# Patient Record
Sex: Female | Born: 1945 | Hispanic: No | Smoking: Former smoker
Health system: Southern US, Community
[De-identification: ages and names within clinical notes are randomized; demographics above are authoritative.]

## PROBLEM LIST (undated history)

## (undated) DIAGNOSIS — M359 Systemic involvement of connective tissue, unspecified: Secondary | ICD-10-CM

## (undated) DIAGNOSIS — Z972 Presence of dental prosthetic device (complete) (partial): Secondary | ICD-10-CM

## (undated) DIAGNOSIS — D649 Anemia, unspecified: Secondary | ICD-10-CM

## (undated) DIAGNOSIS — I82409 Acute embolism and thrombosis of unspecified deep veins of unspecified lower extremity: Secondary | ICD-10-CM

## (undated) DIAGNOSIS — R000 Tachycardia, unspecified: Secondary | ICD-10-CM

## (undated) DIAGNOSIS — M0520 Rheumatoid vasculitis with rheumatoid arthritis of unspecified site: Secondary | ICD-10-CM

## (undated) DIAGNOSIS — K631 Perforation of intestine (nontraumatic): Secondary | ICD-10-CM

## (undated) HISTORY — PX: FOOT SURGERY: SHX648

## (undated) HISTORY — PX: SKIN GRAFT: SHX250

## (undated) HISTORY — PX: REPLACEMENT TOTAL KNEE: SUR1224

## (undated) HISTORY — PX: ABDOMINAL HYSTERECTOMY: SHX81

---

## 2000-03-10 ENCOUNTER — Encounter: Payer: Self-pay | Admitting: Neurosurgery

## 2000-03-10 ENCOUNTER — Inpatient Hospital Stay (HOSPITAL_COMMUNITY): Admission: RE | Admit: 2000-03-10 | Discharge: 2000-03-10 | Payer: Self-pay | Admitting: Neurosurgery

## 2000-06-14 ENCOUNTER — Encounter: Payer: Self-pay | Admitting: Neurosurgery

## 2000-06-14 ENCOUNTER — Ambulatory Visit (HOSPITAL_COMMUNITY): Admission: RE | Admit: 2000-06-14 | Discharge: 2000-06-14 | Payer: Self-pay | Admitting: Neurosurgery

## 2000-11-23 ENCOUNTER — Encounter: Payer: Self-pay | Admitting: Neurosurgery

## 2000-11-23 ENCOUNTER — Ambulatory Visit (HOSPITAL_COMMUNITY): Admission: RE | Admit: 2000-11-23 | Discharge: 2000-11-23 | Payer: Self-pay | Admitting: Neurosurgery

## 2001-02-21 ENCOUNTER — Encounter: Payer: Self-pay | Admitting: Neurosurgery

## 2001-02-21 ENCOUNTER — Ambulatory Visit (HOSPITAL_COMMUNITY): Admission: RE | Admit: 2001-02-21 | Discharge: 2001-02-21 | Payer: Self-pay | Admitting: Neurosurgery

## 2003-11-23 HISTORY — PX: BACK SURGERY: SHX140

## 2005-02-26 ENCOUNTER — Ambulatory Visit: Payer: Self-pay | Admitting: Internal Medicine

## 2006-03-31 ENCOUNTER — Ambulatory Visit: Payer: Self-pay | Admitting: Internal Medicine

## 2007-02-13 ENCOUNTER — Ambulatory Visit: Payer: Self-pay | Admitting: Internal Medicine

## 2007-04-03 ENCOUNTER — Ambulatory Visit: Payer: Self-pay | Admitting: Internal Medicine

## 2008-04-04 ENCOUNTER — Ambulatory Visit: Payer: Self-pay | Admitting: Internal Medicine

## 2008-07-26 ENCOUNTER — Ambulatory Visit: Payer: Self-pay | Admitting: Unknown Physician Specialty

## 2009-04-08 ENCOUNTER — Ambulatory Visit: Payer: Self-pay | Admitting: Internal Medicine

## 2010-04-15 ENCOUNTER — Ambulatory Visit: Payer: Self-pay | Admitting: Internal Medicine

## 2010-06-01 ENCOUNTER — Ambulatory Visit: Payer: Self-pay | Admitting: Internal Medicine

## 2010-08-14 ENCOUNTER — Ambulatory Visit: Payer: Self-pay | Admitting: Internal Medicine

## 2010-10-13 ENCOUNTER — Ambulatory Visit: Payer: Self-pay | Admitting: Internal Medicine

## 2010-11-22 HISTORY — PX: JOINT REPLACEMENT: SHX530

## 2011-04-20 ENCOUNTER — Ambulatory Visit: Payer: Self-pay | Admitting: Internal Medicine

## 2011-05-10 ENCOUNTER — Ambulatory Visit: Payer: Self-pay | Admitting: Unknown Physician Specialty

## 2011-05-17 ENCOUNTER — Inpatient Hospital Stay: Payer: Self-pay | Admitting: Unknown Physician Specialty

## 2011-05-20 ENCOUNTER — Encounter: Payer: Self-pay | Admitting: Internal Medicine

## 2011-05-21 ENCOUNTER — Ambulatory Visit: Payer: Self-pay | Admitting: Internal Medicine

## 2011-05-23 ENCOUNTER — Encounter: Payer: Self-pay | Admitting: Internal Medicine

## 2011-06-23 ENCOUNTER — Encounter: Payer: Self-pay | Admitting: Internal Medicine

## 2011-09-09 ENCOUNTER — Ambulatory Visit: Payer: Self-pay | Admitting: Podiatry

## 2012-04-28 ENCOUNTER — Ambulatory Visit: Payer: Self-pay | Admitting: Internal Medicine

## 2012-05-02 ENCOUNTER — Ambulatory Visit: Payer: Self-pay | Admitting: Internal Medicine

## 2013-04-21 ENCOUNTER — Ambulatory Visit: Payer: Self-pay | Admitting: Internal Medicine

## 2013-05-08 ENCOUNTER — Ambulatory Visit: Payer: Self-pay

## 2013-06-14 ENCOUNTER — Ambulatory Visit: Payer: Self-pay | Admitting: Podiatry

## 2013-10-12 ENCOUNTER — Ambulatory Visit: Payer: Self-pay | Admitting: Gastroenterology

## 2013-10-15 LAB — PATHOLOGY REPORT

## 2014-03-07 DIAGNOSIS — M1990 Unspecified osteoarthritis, unspecified site: Secondary | ICD-10-CM | POA: Insufficient documentation

## 2014-03-14 DIAGNOSIS — Z79899 Other long term (current) drug therapy: Secondary | ICD-10-CM | POA: Insufficient documentation

## 2014-06-04 ENCOUNTER — Ambulatory Visit: Payer: Self-pay

## 2014-06-06 ENCOUNTER — Ambulatory Visit: Payer: Self-pay

## 2015-01-20 DIAGNOSIS — R000 Tachycardia, unspecified: Secondary | ICD-10-CM | POA: Insufficient documentation

## 2015-04-25 ENCOUNTER — Other Ambulatory Visit: Payer: Self-pay | Admitting: Infectious Diseases

## 2015-04-25 DIAGNOSIS — Z1231 Encounter for screening mammogram for malignant neoplasm of breast: Secondary | ICD-10-CM

## 2015-05-02 ENCOUNTER — Ambulatory Visit: Payer: Self-pay

## 2015-05-28 ENCOUNTER — Other Ambulatory Visit: Payer: Self-pay | Admitting: Family Medicine

## 2015-05-28 DIAGNOSIS — Z87891 Personal history of nicotine dependence: Secondary | ICD-10-CM

## 2015-05-29 ENCOUNTER — Ambulatory Visit
Admission: RE | Admit: 2015-05-29 | Discharge: 2015-05-29 | Disposition: A | Discharge status: Home / Self Care | Payer: Medicare Other | Source: Ambulatory Visit | Attending: Family Medicine | Admitting: Family Medicine

## 2015-05-29 ENCOUNTER — Inpatient Hospital Stay: Payer: Medicare Other | Attending: Family Medicine | Admitting: Family Medicine

## 2015-05-29 DIAGNOSIS — J439 Emphysema, unspecified: Secondary | ICD-10-CM | POA: Diagnosis not present

## 2015-05-29 DIAGNOSIS — Z122 Encounter for screening for malignant neoplasm of respiratory organs: Secondary | ICD-10-CM | POA: Diagnosis not present

## 2015-05-29 DIAGNOSIS — F17210 Nicotine dependence, cigarettes, uncomplicated: Secondary | ICD-10-CM

## 2015-05-29 DIAGNOSIS — Z87891 Personal history of nicotine dependence: Secondary | ICD-10-CM | POA: Insufficient documentation

## 2015-05-29 DIAGNOSIS — I2510 Atherosclerotic heart disease of native coronary artery without angina pectoris: Secondary | ICD-10-CM | POA: Insufficient documentation

## 2015-05-29 NOTE — Progress Notes (Signed)
In accordance with CMS guidelines, patient has meet eligibility criteria including age, absence of signs or symptoms of lung cancer, the specific calculation of cigarette smoking pack-years was 31 years and is a current smoker.   A shared decision-making session was conducted prior to the performance of CT scan. This includes one or more decision aids, includes benefits and harms of screening, follow-up diagnostic testing, over-diagnosis, false positive rate, and total radiation exposure.  Counseling on the importance of adherence to annual lung cancer LDCT screening, impact of co-morbidities, and ability or willingness to undergo diagnosis and treatment is imperative for compliance of the program.  Counseling on the importance of continued smoking cessation for former smokers; the importance of smoking cessation for current smokers and information about tobacco cessation interventions have been given to patient including the [place] [facility], [address] quit [place], as well as Cancer [facility] specific smoking cessation programs.  Written order for lung cancer screening with LDCT has been given to the patient and any and all questions have been answered to the best of my abilities.   Yearly follow up will be scheduled by Glenna Fellows, Thoracic Navigator.

## 2015-05-30 ENCOUNTER — Telehealth: Payer: Self-pay | Admitting: *Deleted

## 2015-05-30 NOTE — Telephone Encounter (Signed)
  Oncology Nurse Navigator Documentation    Navigator Encounter Type: Screening (05/30/15 1500)       Notified patient of LDCT lung cancer screening results of Lung Rads  2S finding with recommendation for 12 month follow up imaging. Also notified of incidental findings noted below.  IMPRESSION: 1. Lung-RADS Category 2S, benign appearance or behavior. Continue annual screening with low-dose chest CT without contrast in 12 months. 2. The "S" modifier above refers to potentially clinically significant non lung cancer related findings. Specifically, there is extensive atherosclerosis, including left main and 3 vessel coronary artery disease. Please note that although the presence of coronary artery calcium documents the presence of coronary artery disease, the severity of this disease and any potential stenosis cannot be assessed on this non-gated CT examination. Assessment for potential risk factor modification, dietary therapy or pharmacologic therapy may be warranted, if clinically indicated. 3. Mild diffuse bronchial wall thickening with mild centrilobular and paraseptal emphysema; imaging findings suggestive of underlying COPD.

## 2015-06-10 ENCOUNTER — Ambulatory Visit
Admission: RE | Admit: 2015-06-10 | Discharge: 2015-06-10 | Disposition: A | Discharge status: Home / Self Care | Payer: Medicare Other | Source: Ambulatory Visit | Attending: Infectious Diseases | Admitting: Infectious Diseases

## 2015-06-10 DIAGNOSIS — Z1231 Encounter for screening mammogram for malignant neoplasm of breast: Secondary | ICD-10-CM | POA: Diagnosis present

## 2016-04-19 ENCOUNTER — Ambulatory Visit
Admission: EM | Admit: 2016-04-19 | Discharge: 2016-04-19 | Disposition: A | Discharge status: Home / Self Care | Payer: Medicare Other | Attending: Family Medicine | Admitting: Family Medicine

## 2016-04-19 ENCOUNTER — Encounter: Payer: Self-pay | Admitting: Emergency Medicine

## 2016-04-19 DIAGNOSIS — J309 Allergic rhinitis, unspecified: Secondary | ICD-10-CM | POA: Diagnosis not present

## 2016-04-19 DIAGNOSIS — J0100 Acute maxillary sinusitis, unspecified: Secondary | ICD-10-CM

## 2016-04-19 HISTORY — DX: Rheumatoid vasculitis with rheumatoid arthritis of unspecified site: M05.20

## 2016-04-19 MED ORDER — AZITHROMYCIN 250 MG PO TABS
ORAL_TABLET | ORAL | Status: DC
Start: 2016-04-19 — End: 2016-09-19

## 2016-04-19 NOTE — Discharge Instructions (Signed)
Take medication as prescribed. Rest. Drink plenty of fluids.   Follow up with your primary care physician this week. Return to Urgent care for new or worsening concerns.    Allergic Rhinitis Allergic rhinitis is when the mucous membranes in the nose respond to allergens. Allergens are particles in the air that cause your body to have an allergic reaction. This causes you to release allergic antibodies. Through a chain of events, these eventually cause you to release histamine into the blood stream. Although meant to protect the body, it is this release of histamine that causes your discomfort, such as frequent sneezing, congestion, and an itchy, runny nose.  CAUSES Seasonal allergic rhinitis (hay fever) is caused by pollen allergens that may come from grasses, trees, and weeds. Year-round allergic rhinitis (perennial allergic rhinitis) is caused by allergens such as house dust mites, pet dander, and mold spores. SYMPTOMS  Nasal stuffiness (congestion).  Itchy, runny nose with sneezing and tearing of the eyes. DIAGNOSIS Your health care provider can help you determine the allergen or allergens that trigger your symptoms. If you and your health care provider are unable to determine the allergen, skin or blood testing may be used. Your health care provider will diagnose your condition after taking your health history and performing a physical exam. Your health care provider may assess you for other related conditions, such as asthma, pink eye, or an ear infection. TREATMENT Allergic rhinitis does not have a cure, but it can be controlled by:  Medicines that block allergy symptoms. These may include allergy shots, nasal sprays, and oral antihistamines.  Avoiding the allergen. Hay fever may often be treated with antihistamines in pill or nasal spray forms. Antihistamines block the effects of histamine. There are over-the-counter medicines that may help with nasal congestion and swelling around the  eyes. Check with your health care provider before taking or giving this medicine. If avoiding the allergen or the medicine prescribed do not work, there are many new medicines your health care provider can prescribe. Stronger medicine may be used if initial measures are ineffective. Desensitizing injections can be used if medicine and avoidance does not work. Desensitization is when a patient is given ongoing shots until the body becomes less sensitive to the allergen. Make sure you follow up with your health care provider if problems continue. HOME CARE INSTRUCTIONS It is not possible to completely avoid allergens, but you can reduce your symptoms by taking steps to limit your exposure to them. It helps to know exactly what you are allergic to so that you can avoid your specific triggers. SEEK MEDICAL CARE IF:  You have a fever.  You develop a cough that does not stop easily (persistent).  You have shortness of breath.  You start wheezing.  Symptoms interfere with normal daily activities.   This information is not intended to replace advice given to you by your health care provider. Make sure you discuss any questions you have with your health care provider.   Document Released: 08/03/2001 Document Revised: 11/29/2014 Document Reviewed: 07/16/2013 Elsevier Interactive Patient Education 2016 [employer]  Sinusitis, Adult Sinusitis is redness, soreness, and inflammation of the paranasal sinuses. Paranasal sinuses are air pockets within the bones of your face. They are located beneath your eyes, in the middle of your forehead, and above your eyes. In healthy paranasal sinuses, mucus is able to drain out, and air is able to circulate through them by way of your nose. However, when your paranasal sinuses are inflamed, mucus  and air can become trapped. This can allow bacteria and other germs to grow and cause infection. Sinusitis can develop quickly and last only a short time (acute) or continue  over a long period (chronic). Sinusitis that lasts for more than 12 weeks is considered chronic. CAUSES Causes of sinusitis include:  Allergies.  Structural abnormalities, such as displacement of the cartilage that separates your nostrils (deviated septum), which can decrease the air flow through your nose and sinuses and affect sinus drainage.  Functional abnormalities, such as when the small hairs (cilia) that line your sinuses and help remove mucus do not work properly or are not present. SIGNS AND SYMPTOMS Symptoms of acute and chronic sinusitis are the same. The primary symptoms are pain and pressure around the affected sinuses. Other symptoms include:  Upper toothache.  Earache.  Headache.  Bad breath.  Decreased sense of smell and taste.  A cough, which worsens when you are lying flat.  Fatigue.  Fever.  Thick drainage from your nose, which often is green and may contain pus (purulent).  Swelling and warmth over the affected sinuses. DIAGNOSIS Your health care provider will perform a physical exam. During your exam, your health care provider may perform any of the following to help determine if you have acute sinusitis or chronic sinusitis:  Look in your nose for signs of abnormal growths in your nostrils (nasal polyps).  Tap over the affected sinus to check for signs of infection.  View the inside of your sinuses using an imaging device that has a light attached (endoscope). If your health care provider suspects that you have chronic sinusitis, one or more of the following tests may be recommended:  Allergy tests.  Nasal culture. A sample of mucus is taken from your nose, sent to a lab, and screened for bacteria.  Nasal cytology. A sample of mucus is taken from your nose and examined by your health care provider to determine if your sinusitis is related to an allergy. TREATMENT Most cases of acute sinusitis are related to a viral infection and will resolve on  their own within 10 days. Sometimes, medicines are prescribed to help relieve symptoms of both acute and chronic sinusitis. These may include pain medicines, decongestants, nasal steroid sprays, or saline sprays. However, for sinusitis related to a bacterial infection, your health care provider will prescribe antibiotic medicines. These are medicines that will help kill the bacteria causing the infection. Rarely, sinusitis is caused by a fungal infection. In these cases, your health care provider will prescribe antifungal medicine. For some cases of chronic sinusitis, surgery is needed. Generally, these are cases in which sinusitis recurs more than 3 times per year, despite other treatments. HOME CARE INSTRUCTIONS  Drink plenty of water. Water helps thin the mucus so your sinuses can drain more easily.  Use a humidifier.  Inhale steam 3-4 times a day (for example, sit in the bathroom with the shower running).  Apply a warm, moist washcloth to your face 3-4 times a day, or as directed by your health care provider.  Use saline nasal sprays to help moisten and clean your sinuses.  Take medicines only as directed by your health care provider.  If you were prescribed either an antibiotic or antifungal medicine, finish it all even if you start to feel better. SEEK IMMEDIATE MEDICAL CARE IF:  You have increasing pain or severe headaches.  You have nausea, vomiting, or drowsiness.  You have swelling around your face.  You have vision problems.  You have a stiff neck.  You have difficulty breathing.   This information is not intended to replace advice given to you by your health care provider. Make sure you discuss any questions you have with your health care provider.   Document Released: 11/08/2005 Document Revised: 11/29/2014 Document Reviewed: 11/23/2011 Elsevier Interactive Patient Education [employer]

## 2016-04-19 NOTE — ED Notes (Signed)
Pt having a week of nasal congestion and runny nose, tried OTC meds but won't go away. In head and chest, and pt having a cough. Pt denies fever or chills.

## 2016-04-19 NOTE — ED Provider Notes (Signed)
[facility]  ____________________________________________  Time seen: Approximately 9:36 AM  I have reviewed the triage vital signs and the nursing notes.   HISTORY  Chief Complaint Nasal Congestion  HPI Ruth Gray is a 70 y.o. femalepresents with a complaint 7-8 days of runny nose, nasal congestion, sinus pressure, sinus drainage, and cough. Patient reports cough is worse at night with associated with postnasal drainage. Patient reports that she is frequently spitting up as well as blowing her nose and getting thick greenish mucus out. Reports sinuses feel clogged and congested. Patient reports that she does have has a history of seasonal allergies in which she feels that this is a trigger for her current symptoms. Denies fevers. Reports continues to eat and drink well. Reports has been taking over-the-counter and histamine as well as Flonase with minimal improvement but no resolution.  Denies chest pain, shortness of breath, chest pain with deep breath, wheezing, abdominal pain, neck pain, blood in sputum, back pain, dysuria, recent sickness or recent antibiotic use.  PCP: Sampson Goon   Past Medical History  Diagnosis Date  . Rheumatoid arteritis     There are no active problems to display for this patient.   Past Surgical History  Procedure Laterality Date  . Replacement total knee Left   . Foot surgery    . Abdominal hysterectomy      Current Outpatient Rx  Name  Route  Sig  Dispense  Refill  . Calcium Carb-Cholecalciferol (CALCIUM + D3 PO)   Oral   Take by mouth.         . InFLIXimab (REMICADE IV)   Intravenous   Inject into the vein once a week.         . Methotrexate, Anti-Rheumatic, (METHOTREXATE, PF, [place])   Subcutaneous   Inject into the skin.           Allergies Penicillins rash and tongue swelling Cipro: joint pain  History reviewed. No pertinent family history.  Social History Social History  Substance Use Topics  . Smoking  status: Current Every Day Smoker -- 0.50 packs/day    Types: Cigarettes  . Smokeless tobacco: None  . Alcohol Use: No    Review of Systems Constitutional: No fever/chills Eyes: No visual changes. ENT: No sore throat.as above. Cardiovascular: Denies chest pain. Respiratory: Denies shortness of breath. Gastrointestinal: No abdominal pain.  No nausea, no vomiting.  No diarrhea.  No constipation. Genitourinary: Negative for dysuria. Musculoskeletal: Negative for back pain. Skin: Negative for rash. Neurological: Negative for headaches, focal weakness or numbness.  10-point ROS otherwise negative.  ____________________________________________   PHYSICAL EXAM:  VITAL SIGNS: ED Triage Vitals  Enc Vitals Group     BP 04/19/16 0850 146/98 mmHg     Pulse Rate 04/19/16 0850 79     Resp 04/19/16 0850 16     Temp 04/19/16 0850 98.2 F (36.8 C)     Temp Source 04/19/16 0850 Oral     SpO2 04/19/16 0850 97 %     Weight 04/19/16 0850 185 lb (83.915 kg)     Height 04/19/16 0850 5\' 7"  (1.702 m)     Head Cir --      Peak Flow --      Pain Score 04/19/16 0856 1     Pain Loc --      Pain Edu? --      Excl. in GC? --    Constitutional: Alert and oriented. Well appearing and in no acute distress. Eyes: Conjunctivae are  normal. PERRL. EOMI. Head: Atraumatic.Mild to moderate tenderness to palpation bilateral frontal and maxillary sinuses. No swelling. No erythema.   Ears: no erythema, normal TMs bilaterally.   Nose: nasal congestion with bilateral nasal turbinate erythema and edema and greenish drainage.   Mouth/Throat: Mucous membranes are moist.  Oropharynx non-erythematous.No tonsillar swelling or exudate.  Neck: No stridor.  No cervical spine tenderness to palpation. Hematological/Lymphatic/Immunilogical: No cervical lymphadenopathy. Cardiovascular: Normal rate, regular rhythm. Grossly normal heart sounds.  Good peripheral circulation. Respiratory: Normal respiratory effort.  No  retractions. Lungs CTAB. No wheezes, rales or rhonchi. Good air movement.  Dry intermittent cough noted. Gastrointestinal: Soft and nontender. No distention. Normal Bowel sounds. No CVA tenderness. Musculoskeletal: No lower or upper extremity tenderness nor edema.  No calf tenderness bilaterally. No cervical, thoracic or lumbar tenderness to palpation.  Neurologic:  Normal speech and language. No gross focal neurologic deficits are appreciated. No gait instability. Skin:  Skin is warm, dry and intact. No rash noted. Psychiatric: Mood and affect are normal. Speech and behavior are normal.  ____________________________________________   LABS (all labs ordered are listed, but only abnormal results are displayed)  Labs Reviewed - No data to display  RADIOLOGY  No results found.   INITIAL IMPRESSION / ASSESSMENT AND PLAN / ED COURSE  Pertinent labs & imaging results that were available during my care of the patient were reviewed by me and considered in my medical decision making (see chart for details).  Very well-appearing patient. No acute distress. Presents for the complaints of runny nose, nasal congestion, sinus pressure and sinus drainage, cough and postnasal drainage 7-8 days. Unrelieved by over-the-counter medications. Suspect sinusitis and bronchitis. Patient reports penicillin allergic with rash and tongue swelling, on methotrexate, and allergic to Cipro will treat patient with oral azithromycin. Patient reports has taken azithromycin in the past and tolerated well. Patient denies need for cough medication. Continue home over-the-counter Claritin and Flonase. Encouraged rest, fluids and PCP follow-up. Discussed indication, risks and benefits of medications with patient.  Discussed follow up with Primary care physician this week. Discussed follow up and return parameters including no resolution or any worsening concerns. Patient verbalized understanding and agreed to plan.    ____________________________________________   FINAL CLINICAL IMPRESSION(S) / ED DIAGNOSES  Final diagnoses:  Acute maxillary sinusitis, recurrence not specified  Allergic rhinitis, unspecified allergic rhinitis type     Discharge Medication List as of 04/19/2016  9:50 AM      Note: This dictation was prepared with Dragon dictation along with smaller phrase technology. Any transcriptional errors that result from this process are unintentional.       Renford Dills, NP 04/19/16 1030

## 2016-04-30 ENCOUNTER — Other Ambulatory Visit: Payer: Self-pay | Admitting: Infectious Diseases

## 2016-04-30 DIAGNOSIS — I2510 Atherosclerotic heart disease of native coronary artery without angina pectoris: Secondary | ICD-10-CM | POA: Insufficient documentation

## 2016-04-30 DIAGNOSIS — Z1231 Encounter for screening mammogram for malignant neoplasm of breast: Secondary | ICD-10-CM

## 2016-05-23 ENCOUNTER — Ambulatory Visit (INDEPENDENT_AMBULATORY_CARE_PROVIDER_SITE_OTHER): Payer: Medicare Other

## 2016-05-23 ENCOUNTER — Ambulatory Visit
Admission: EM | Admit: 2016-05-23 | Discharge: 2016-05-23 | Disposition: A | Discharge status: Home / Self Care | Payer: Medicare Other | Attending: Family Medicine | Admitting: Family Medicine

## 2016-05-23 ENCOUNTER — Encounter: Payer: Self-pay | Admitting: *Deleted

## 2016-05-23 DIAGNOSIS — M25561 Pain in right knee: Secondary | ICD-10-CM

## 2016-05-23 MED ORDER — OXYCODONE-ACETAMINOPHEN 5-325 MG PO TABS: 0.5000 | ORAL_TABLET | Freq: Three times a day (TID) | ORAL | Status: DC | PRN

## 2016-05-23 NOTE — Discharge Instructions (Signed)
Take medication as prescribed. Rest. Apply ice and elevate. Use walker at home as needed.  Follow up with orthopedic this week as discussed.   Follow up with your primary care physician this week as needed. Return to Urgent care for new or worsening concerns.     Knee Pain Knee pain is a very common symptom and can have many causes. Knee pain often goes away when you follow your health care provider's instructions for relieving pain and discomfort at home. However, knee pain can develop into a condition that needs treatment. Some conditions may include:  Arthritis caused by wear and tear (osteoarthritis).  Arthritis caused by swelling and irritation (rheumatoid arthritis or gout).  A cyst or growth in your knee.  An infection in your knee joint.  An injury that will not heal.  Damage, swelling, or irritation of the tissues that support your knee (torn ligaments or tendinitis). If your knee pain continues, additional tests may be ordered to diagnose your condition. Tests may include X-rays or other imaging studies of your knee. You may also need to have fluid removed from your knee. Treatment for ongoing knee pain depends on the cause, but treatment may include:  Medicines to relieve pain or swelling.  Steroid injections in your knee.  Physical therapy.  Surgery. HOME CARE INSTRUCTIONS  Take medicines only as directed by your health care provider.  Rest your knee and keep it raised (elevated) while you are resting.  Do not do things that cause or worsen pain.  Avoid high-impact activities or exercises, such as running, jumping rope, or doing jumping jacks.  Apply ice to the knee area:  Put ice in a plastic bag.  Place a towel between your skin and the bag.  Leave the ice on for 20 minutes, 2-3 times a day.  Ask your health care provider if you should wear an elastic knee support.  Keep a pillow under your knee when you sleep.  Lose weight if you are overweight.  Extra weight can put pressure on your knee.  Do not use any tobacco products, including cigarettes, chewing tobacco, or electronic cigarettes. If you need help quitting, ask your health care provider. Smoking may slow the healing of any bone and joint problems that you may have. SEEK MEDICAL CARE IF:  Your knee pain continues, changes, or gets worse.  You have a fever along with knee pain.  Your knee buckles or locks up.  Your knee becomes more swollen. SEEK IMMEDIATE MEDICAL CARE IF:   Your knee joint feels hot to the touch.  You have chest pain or trouble breathing.   This information is not intended to replace advice given to you by your health care provider. Make sure you discuss any questions you have with your health care provider.   Document Released: 09/05/2007 Document Revised: 11/29/2014 Document Reviewed: 06/24/2014 Elsevier Interactive Patient Education [employer]

## 2016-05-23 NOTE — ED Provider Notes (Signed)
[facility]  ____________________________________________  Time seen: Approximately 8:28 AM  I have reviewed the triage vital signs and the nursing notes.   HISTORY  Chief Complaint Knee Pain  HPI Ruth Gray is a 70 y.o. female presents with a complaint of right knee pain. Patient reports right knee pain onset was yesterday when she stepped out of the shower. Patient states that she stepped out of the shower onto the floor which is about a 3 inch height and when she planted her right foot she had pain and a tearing sound to her right knee. Denies any slip or sliding. Denies fall. Denies head injury or loss consciousness or other injury.  Patient reports she's had continued right knee pain since. Patient states pain is mostly with walking and weightbearing. Denies feeling of instability or that the knee is going to give out. Patient reports that she has had history of occasional right knee pain with arthritis. 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 Denies any pain radiation. Denies any pain in the back of the knee. Patient states most of the pain is to the front on the top of her right knee. Denies any swelling, redness, trauma or direct injury. Reports feeling well otherwise. Reports ice and resting the knee did help.    PCP: Sampson Goon   Past Medical History  Diagnosis Date  . Rheumatoid arteritis     There are no active problems to display for this patient.   Past Surgical History  Procedure Laterality Date  . Replacement total knee Left   . Foot surgery    . Abdominal hysterectomy      Current Outpatient Rx  Name  Route  Sig  Dispense  Refill  . Calcium Carb-Cholecalciferol (CALCIUM + D3 PO)   Oral   Take by mouth.         . InFLIXimab (REMICADE IV)   Intravenous   Inject into the vein once a week.         . Methotrexate, Anti-Rheumatic, (METHOTREXATE, PF, [place])   Subcutaneous   Inject into the skin.         .              Allergies Penicillins  History reviewed. No pertinent family history.  Social History Social History  Substance Use Topics  . Smoking status: Current Every Day Smoker -- 0.50 packs/day    Types: Cigarettes  . Smokeless tobacco: None  . Alcohol Use: No    Review of Systems Constitutional: No fever/chills Eyes: No visual changes. ENT: No sore throat. Cardiovascular: Denies chest pain. Respiratory: Denies shortness of breath. Gastrointestinal: No abdominal pain.  No nausea, no vomiting.  No diarrhea.  No constipation. Genitourinary: Negative for dysuria. Musculoskeletal: Negative for back pain. Positive knee pain.  Skin: Negative for rash. Neurological: Negative for headaches, focal weakness or numbness.  10-point ROS otherwise negative.  ____________________________________________   PHYSICAL EXAM:  VITAL SIGNS: ED Triage Vitals  Enc Vitals Group     BP 05/23/16 0811 155/96 mmHg     Pulse Rate 05/23/16 0811 83     Resp 05/23/16 0811 18     Temp 05/23/16 0811 97.9 F (36.6 C)     Temp Source 05/23/16 0811 Oral     SpO2 05/23/16 0811 99 %     Weight 05/23/16 0811 186 lb (84.369 kg)     Height 05/23/16 0811 5\' 7"  (1.702 m)     Head Cir --  Peak Flow --      Pain Score 05/23/16 0813 9     Pain Loc --      Pain Edu? --      Excl. in GC? --     Constitutional: Alert and oriented. Well appearing and in no acute distress. Eyes: Conjunctivae are normal. PERRL. EOMI. Head: Atraumatic.  Mouth/Throat: Mucous membranes are moist.  Neck: No stridor.  No cervical spine tenderness to palpation. Hematological/Lymphatic/Immunilogical: No cervical lymphadenopathy. Cardiovascular: Normal rate, regular rhythm. Grossly normal heart sounds.  Good peripheral circulation. Respiratory: Normal respiratory effort.  No retractions. Lungs CTAB. No wheezes, rales or rhonchi.  Gastrointestinal: Soft and nontender. No distention.  Musculoskeletal: No lower or upper extremity  tenderness nor edema.  No cervical, thoracic, or lumbar tenderness to palpation.  Except: right medial and lateral knee mild to moderate tenderness to direct palpation, pain with anterior drawer test and medial stress, full range of motion, no erythema, no ecchymosis, skin intact, no noted swelling, mild pain with resisted knee extension and flexion, no posterior knee tenderness. No calf tenderness bilaterally. Right lower extremity otherwise nontender. Bilateral pedal pulses equal and easily palpated.   Neurologic:  Normal speech and language. No gross focal neurologic deficits are appreciated. No gait instability. Skin:  Skin is warm, dry and intact. No rash noted. Psychiatric: Mood and affect are normal. Speech and behavior are normal.  ____________________________________________   LABS (all labs ordered are listed, but only abnormal results are displayed)  Labs Reviewed - No data to display  RADIOLOGY  Dg Knee Complete 4 Views Right  05/23/2016  CLINICAL DATA:  Twisted knee.  Medial and lateral knee pain EXAM: RIGHT KNEE - COMPLETE 4+ VIEW COMPARISON:  None. FINDINGS: Mild joint space narrowing medially with moderate spurring in the medial joint compartment. Widening of the lateral joint compartment with extensive spurring. Extensive joint space narrowing and spurring in the patellofemoral joint. Joint effusion. Calcification posterior to the joint compatible with loose body. Negative for fracture IMPRESSION: Advanced degenerative change. Calcified loose body in the joint. No fracture. Electronically Signed   By: Marlan Palau M.D.   On: 05/23/2016 09:11   ____________________________________________   PROCEDURES  Procedure(s) performed:  Denies need for assistive devices.  ____________________   INITIAL IMPRESSION / ASSESSMENT AND PLAN / ED COURSE  Pertinent labs & imaging results that were available during my care of the patient were reviewed by me and considered in my medical  decision making (see chart for details).  Well-appearing patient. No acute distress. Presents for the complaints of right knee pain after stepping down her repeat technique a shower yesterday. Denies fall. Denies direct trauma. Denies head injury or loss consciousness. Denies other pain. Pain is fully reproducible by palpation and movement. Will evaluate x-ray.  Per radiologist x-ray advanced degenerative change, calcified loose body in the joint, no fracture, joint effusion. Patient with arthritic knee and concern for ligamentous or meniscal injury in addition. Discussed with patient rest, ice, elevation conservative management. Patient reports she has a walker at home if needed. Denies need for walker, assistive devices or splinting. Will prescribe Percocet as needed for breakthrough pain. Patient currently taking methotrexate. Encouraged patient to take half tablet only at a time as needed for Percocet patient agreed to this. Follow-up with orthopedics week. Patient states she follows with an orthopedic in the past.Discussed indication, risks and benefits of medications with patient.  Discussed follow up with Primary care physician this week. Discussed follow up and return parameters including  no resolution or any worsening concerns. Patient verbalized understanding and agreed to plan.   ____________________________________________   FINAL CLINICAL IMPRESSION(S) / ED DIAGNOSES  Final diagnoses:  Right knee pain     Discharge Medication List as of 05/23/2016  9:22 AM    START taking these medications   Details  oxyCODONE-acetaminophen (ROXICET) 5-325 MG tablet Take 0.5 tablets by mouth every 8 (eight) hours as needed for moderate pain or severe pain (take 0.5-1 tablet as needed; Do not drive or operate heavy machinery while taking as can cause drowsiness.)., Starting 05/23/2016, Until Discontinued, Print        Note: This dictation was prepared with Dragon dictation along with smaller  phrase technology. Any transcriptional errors that result from this process are unintentional.       Renford Dills, NP 05/23/16 1152

## 2016-05-23 NOTE — ED Notes (Signed)
Patient complains of right knee pain. She says she stepped out of the shower yesterday and heard a crunch/tear sound.

## 2016-06-08 DIAGNOSIS — M1711 Unilateral primary osteoarthritis, right knee: Secondary | ICD-10-CM | POA: Insufficient documentation

## 2016-06-08 DIAGNOSIS — M25561 Pain in right knee: Secondary | ICD-10-CM | POA: Insufficient documentation

## 2016-06-14 ENCOUNTER — Ambulatory Visit
Admission: RE | Admit: 2016-06-14 | Discharge: 2016-06-14 | Disposition: A | Discharge status: Home / Self Care | Payer: Medicare Other | Source: Ambulatory Visit | Attending: Infectious Diseases | Admitting: Infectious Diseases

## 2016-06-14 ENCOUNTER — Other Ambulatory Visit: Payer: Self-pay | Admitting: Infectious Diseases

## 2016-06-14 DIAGNOSIS — Z1231 Encounter for screening mammogram for malignant neoplasm of breast: Secondary | ICD-10-CM | POA: Diagnosis present

## 2016-06-16 ENCOUNTER — Other Ambulatory Visit: Payer: Self-pay | Admitting: Unknown Physician Specialty

## 2016-06-16 DIAGNOSIS — M25561 Pain in right knee: Secondary | ICD-10-CM

## 2016-06-24 ENCOUNTER — Telehealth: Payer: Self-pay | Admitting: *Deleted

## 2016-06-24 NOTE — Telephone Encounter (Signed)
Left voicemail for patient notifyng them that it is time to schedule annual low dose lung cancer screening CT scan. Instructed patient to call back to verify information prior to the scan being scheduled.  

## 2016-06-26 ENCOUNTER — Ambulatory Visit
Admission: RE | Admit: 2016-06-26 | Discharge: 2016-06-26 | Disposition: A | Discharge status: Home / Self Care | Payer: Medicare Other | Source: Ambulatory Visit | Attending: Unknown Physician Specialty | Admitting: Unknown Physician Specialty

## 2016-06-26 DIAGNOSIS — M25561 Pain in right knee: Secondary | ICD-10-CM | POA: Diagnosis not present

## 2016-06-26 DIAGNOSIS — M1711 Unilateral primary osteoarthritis, right knee: Secondary | ICD-10-CM | POA: Diagnosis not present

## 2016-06-26 DIAGNOSIS — M23311 Other meniscus derangements, anterior horn of medial meniscus, right knee: Secondary | ICD-10-CM | POA: Insufficient documentation

## 2016-06-26 DIAGNOSIS — M23341 Other meniscus derangements, anterior horn of lateral meniscus, right knee: Secondary | ICD-10-CM | POA: Insufficient documentation

## 2016-06-28 ENCOUNTER — Telehealth: Payer: Self-pay | Admitting: *Deleted

## 2016-06-28 NOTE — Telephone Encounter (Signed)
Notified patient that annual lung cancer screening low dose CT scan is due. Confirmed that patient is within the age range of 55-77, and asymptomatic, (no signs or symptoms of lung cancer). Patient denies illness that would prevent curative treatment for lung cancer if found. The patient is a current smoker, with a 31.25 pack year history. The shared decision making visit was done 05/29/15. Patient is agreeable for CT scan being scheduled.

## 2016-06-29 DIAGNOSIS — Z96652 Presence of left artificial knee joint: Secondary | ICD-10-CM | POA: Insufficient documentation

## 2016-07-09 ENCOUNTER — Other Ambulatory Visit: Payer: Self-pay | Admitting: *Deleted

## 2016-07-09 DIAGNOSIS — Z87891 Personal history of nicotine dependence: Secondary | ICD-10-CM

## 2016-07-22 ENCOUNTER — Ambulatory Visit
Admission: RE | Admit: 2016-07-22 | Discharge: 2016-07-22 | Disposition: A | Discharge status: Home / Self Care | Payer: Medicare Other | Source: Ambulatory Visit | Attending: Oncology | Admitting: Oncology

## 2016-08-02 ENCOUNTER — Telehealth: Payer: Self-pay | Admitting: *Deleted

## 2016-08-02 NOTE — Telephone Encounter (Signed)
Patient reports that she was not notified of CT scan being scheduled. Will have rescheduled.

## 2016-08-09 ENCOUNTER — Ambulatory Visit
Admission: RE | Admit: 2016-08-09 | Discharge: 2016-08-09 | Disposition: A | Discharge status: Home / Self Care | Payer: Medicare Other | Source: Ambulatory Visit | Attending: Oncology | Admitting: Oncology

## 2016-08-09 DIAGNOSIS — J439 Emphysema, unspecified: Secondary | ICD-10-CM | POA: Diagnosis not present

## 2016-08-09 DIAGNOSIS — I2510 Atherosclerotic heart disease of native coronary artery without angina pectoris: Secondary | ICD-10-CM | POA: Diagnosis not present

## 2016-08-09 DIAGNOSIS — Z87891 Personal history of nicotine dependence: Secondary | ICD-10-CM | POA: Diagnosis present

## 2016-08-13 ENCOUNTER — Telehealth: Payer: Self-pay | Admitting: *Deleted

## 2016-08-13 NOTE — Telephone Encounter (Signed)
Voicemail left for patient to call back to be notified of LDCT lung cancer screening results with recommendation for 12 month follow up imaging. Also will be notified of incidental finding noted below. This note will be sent to PCP via Epic.  IMPRESSION: 1. Lung-RADS Category 2, benign appearance or behavior. Continue annual screening with low-dose chest CT without contrast in 12 months. 2. Coronary artery atherosclerosis. 3. Emphysema.

## 2016-08-22 DIAGNOSIS — K631 Perforation of intestine (nontraumatic): Secondary | ICD-10-CM

## 2016-08-22 HISTORY — DX: Perforation of intestine (nontraumatic): K63.1

## 2016-09-19 ENCOUNTER — Encounter: Payer: Self-pay | Admitting: Radiology

## 2016-09-19 ENCOUNTER — Emergency Department: Payer: Medicare Other

## 2016-09-19 ENCOUNTER — Inpatient Hospital Stay
Admission: EM | Admit: 2016-09-19 | Discharge: 2016-10-13 | DRG: 329 | Disposition: A | Discharge status: Skilled Nursing Facility | Payer: Medicare Other | Attending: Surgery | Admitting: Surgery

## 2016-09-19 DIAGNOSIS — Z79899 Other long term (current) drug therapy: Secondary | ICD-10-CM | POA: Diagnosis not present

## 2016-09-19 DIAGNOSIS — E43 Unspecified severe protein-calorie malnutrition: Secondary | ICD-10-CM | POA: Insufficient documentation

## 2016-09-19 DIAGNOSIS — Z90710 Acquired absence of both cervix and uterus: Secondary | ICD-10-CM

## 2016-09-19 DIAGNOSIS — G9341 Metabolic encephalopathy: Secondary | ICD-10-CM | POA: Diagnosis not present

## 2016-09-19 DIAGNOSIS — M359 Systemic involvement of connective tissue, unspecified: Secondary | ICD-10-CM | POA: Diagnosis present

## 2016-09-19 DIAGNOSIS — Z96652 Presence of left artificial knee joint: Secondary | ICD-10-CM | POA: Diagnosis present

## 2016-09-19 DIAGNOSIS — K5720 Diverticulitis of large intestine with perforation and abscess without bleeding: Principal | ICD-10-CM

## 2016-09-19 DIAGNOSIS — E669 Obesity, unspecified: Secondary | ICD-10-CM | POA: Diagnosis present

## 2016-09-19 DIAGNOSIS — R2681 Unsteadiness on feet: Secondary | ICD-10-CM

## 2016-09-19 DIAGNOSIS — Y839 Surgical procedure, unspecified as the cause of abnormal reaction of the patient, or of later complication, without mention of misadventure at the time of the procedure: Secondary | ICD-10-CM | POA: Diagnosis not present

## 2016-09-19 DIAGNOSIS — Z9049 Acquired absence of other specified parts of digestive tract: Secondary | ICD-10-CM

## 2016-09-19 DIAGNOSIS — K9429 Other complications of gastrostomy: Secondary | ICD-10-CM | POA: Diagnosis not present

## 2016-09-19 DIAGNOSIS — T814XXA Infection following a procedure, initial encounter: Secondary | ICD-10-CM | POA: Diagnosis not present

## 2016-09-19 DIAGNOSIS — Y92230 Patient room in hospital as the place of occurrence of the external cause: Secondary | ICD-10-CM | POA: Diagnosis not present

## 2016-09-19 DIAGNOSIS — A419 Sepsis, unspecified organism: Secondary | ICD-10-CM | POA: Diagnosis not present

## 2016-09-19 DIAGNOSIS — L0291 Cutaneous abscess, unspecified: Secondary | ICD-10-CM

## 2016-09-19 DIAGNOSIS — Z885 Allergy status to narcotic agent status: Secondary | ICD-10-CM

## 2016-09-19 DIAGNOSIS — E872 Acidosis: Secondary | ICD-10-CM | POA: Diagnosis not present

## 2016-09-19 DIAGNOSIS — J9600 Acute respiratory failure, unspecified whether with hypoxia or hypercapnia: Secondary | ICD-10-CM

## 2016-09-19 DIAGNOSIS — Z6830 Body mass index (BMI) 30.0-30.9, adult: Secondary | ICD-10-CM

## 2016-09-19 DIAGNOSIS — K5792 Diverticulitis of intestine, part unspecified, without perforation or abscess without bleeding: Secondary | ICD-10-CM

## 2016-09-19 DIAGNOSIS — Z880 Allergy status to penicillin: Secondary | ICD-10-CM

## 2016-09-19 DIAGNOSIS — F17210 Nicotine dependence, cigarettes, uncomplicated: Secondary | ICD-10-CM | POA: Diagnosis present

## 2016-09-19 DIAGNOSIS — K56609 Unspecified intestinal obstruction, unspecified as to partial versus complete obstruction: Secondary | ICD-10-CM

## 2016-09-19 DIAGNOSIS — R112 Nausea with vomiting, unspecified: Secondary | ICD-10-CM

## 2016-09-19 DIAGNOSIS — E876 Hypokalemia: Secondary | ICD-10-CM | POA: Diagnosis present

## 2016-09-19 DIAGNOSIS — K651 Peritoneal abscess: Secondary | ICD-10-CM | POA: Diagnosis not present

## 2016-09-19 DIAGNOSIS — R109 Unspecified abdominal pain: Secondary | ICD-10-CM

## 2016-09-19 DIAGNOSIS — D649 Anemia, unspecified: Secondary | ICD-10-CM | POA: Diagnosis present

## 2016-09-19 DIAGNOSIS — R6520 Severe sepsis without septic shock: Secondary | ICD-10-CM | POA: Diagnosis not present

## 2016-09-19 DIAGNOSIS — E162 Hypoglycemia, unspecified: Secondary | ICD-10-CM | POA: Diagnosis present

## 2016-09-19 DIAGNOSIS — I959 Hypotension, unspecified: Secondary | ICD-10-CM | POA: Diagnosis present

## 2016-09-19 DIAGNOSIS — Z452 Encounter for adjustment and management of vascular access device: Secondary | ICD-10-CM

## 2016-09-19 DIAGNOSIS — R1030 Lower abdominal pain, unspecified: Secondary | ICD-10-CM

## 2016-09-19 DIAGNOSIS — N170 Acute kidney failure with tubular necrosis: Secondary | ICD-10-CM | POA: Diagnosis present

## 2016-09-19 DIAGNOSIS — R000 Tachycardia, unspecified: Secondary | ICD-10-CM | POA: Diagnosis present

## 2016-09-19 DIAGNOSIS — Z4659 Encounter for fitting and adjustment of other gastrointestinal appliance and device: Secondary | ICD-10-CM

## 2016-09-19 DIAGNOSIS — K567 Ileus, unspecified: Secondary | ICD-10-CM | POA: Diagnosis present

## 2016-09-19 DIAGNOSIS — K910 Vomiting following gastrointestinal surgery: Secondary | ICD-10-CM | POA: Diagnosis not present

## 2016-09-19 DIAGNOSIS — IMO0002 Reserved for concepts with insufficient information to code with codable children: Secondary | ICD-10-CM

## 2016-09-19 DIAGNOSIS — M069 Rheumatoid arthritis, unspecified: Secondary | ICD-10-CM | POA: Diagnosis present

## 2016-09-19 DIAGNOSIS — M6281 Muscle weakness (generalized): Secondary | ICD-10-CM

## 2016-09-19 DIAGNOSIS — Z881 Allergy status to other antibiotic agents status: Secondary | ICD-10-CM

## 2016-09-19 DIAGNOSIS — K5732 Diverticulitis of large intestine without perforation or abscess without bleeding: Secondary | ICD-10-CM

## 2016-09-19 DIAGNOSIS — J9601 Acute respiratory failure with hypoxia: Secondary | ICD-10-CM | POA: Diagnosis not present

## 2016-09-19 HISTORY — DX: Systemic involvement of connective tissue, unspecified: M35.9

## 2016-09-19 LAB — URINALYSIS COMPLETE WITH MICROSCOPIC (ARMC ONLY)
BILIRUBIN URINE: NEGATIVE
Glucose, UA: NEGATIVE mg/dL
Hgb urine dipstick: NEGATIVE
Ketones, ur: NEGATIVE mg/dL
Leukocytes, UA: NEGATIVE
NITRITE: NEGATIVE
PROTEIN: NEGATIVE mg/dL
Specific Gravity, Urine: 1.004 — ABNORMAL LOW (ref 1.005–1.030)
pH: 6 (ref 5.0–8.0)

## 2016-09-19 LAB — COMPREHENSIVE METABOLIC PANEL
ALBUMIN: 3.7 g/dL (ref 3.5–5.0)
ALT: 19 U/L (ref 14–54)
ANION GAP: 12 (ref 5–15)
AST: 31 U/L (ref 15–41)
Alkaline Phosphatase: 83 U/L (ref 38–126)
BILIRUBIN TOTAL: 0.7 mg/dL (ref 0.3–1.2)
BUN: 14 mg/dL (ref 6–20)
CALCIUM: 8.9 mg/dL (ref 8.9–10.3)
CO2: 21 mmol/L — AB (ref 22–32)
Chloride: 99 mmol/L — ABNORMAL LOW (ref 101–111)
Creatinine, Ser: 1.07 mg/dL — ABNORMAL HIGH (ref 0.44–1.00)
GFR calc Af Amer: 60 mL/min — ABNORMAL LOW (ref >60)
GFR calc non Af Amer: 52 mL/min — ABNORMAL LOW (ref >60)
Glucose, Bld: 103 mg/dL — ABNORMAL HIGH (ref 65–99)
Potassium: 3.4 mmol/L — ABNORMAL LOW (ref 3.5–5.1)
Sodium: 132 mmol/L — ABNORMAL LOW (ref 135–145)
TOTAL PROTEIN: 8 g/dL (ref 6.5–8.1)

## 2016-09-19 LAB — CBC
HCT: 40.2 % (ref 35.0–47.0)
HEMOGLOBIN: 13.7 g/dL (ref 12.0–16.0)
MCH: 32.3 pg (ref 26.0–34.0)
MCHC: 34.1 g/dL (ref 32.0–36.0)
MCV: 94.7 fL (ref 80.0–100.0)
PLATELETS: 259 10*3/uL (ref 150–440)
RBC: 4.24 MIL/uL (ref 3.80–5.20)
RDW: 14.9 % — AB (ref 11.5–14.5)
WBC: 9.1 10*3/uL (ref 3.6–11.0)

## 2016-09-19 LAB — LIPASE, BLOOD: Lipase: 18 U/L (ref 11–51)

## 2016-09-19 MED ORDER — IOPAMIDOL (ISOVUE-300) INJECTION 61%
100.0000 mL | Freq: Once | INTRAVENOUS | Status: AC | PRN
  Administered 2016-09-19: 100 mL via INTRAVENOUS

## 2016-09-19 MED ORDER — SODIUM CHLORIDE 0.9 % IV BOLUS (SEPSIS)
1000.0000 mL | Freq: Once | INTRAVENOUS | Status: AC
  Administered 2016-09-19: 1000 mL via INTRAVENOUS

## 2016-09-19 MED ORDER — DOCUSATE SODIUM 100 MG PO CAPS
100.0000 mg | ORAL_CAPSULE | Freq: Two times a day (BID) | ORAL | Status: DC
  Administered 2016-09-19 – 2016-09-20 (×3): 100 mg via ORAL
  Filled 2016-09-19 (×3): qty 1

## 2016-09-19 MED ORDER — DEXTROSE 5 % IV SOLN
2.0000 g | Freq: Four times a day (QID) | INTRAVENOUS | Status: DC
  Administered 2016-09-19 – 2016-09-21 (×8): 2 g via INTRAVENOUS
  Filled 2016-09-19 (×10): qty 2

## 2016-09-19 MED ORDER — DEXTROSE 5 % IV SOLN: 2.0000 g | Freq: Once | INTRAVENOUS | Status: DC

## 2016-09-19 MED ORDER — BISACODYL 10 MG RE SUPP
10.0000 mg | Freq: Every day | RECTAL | Status: DC | PRN
Start: 2016-09-19 — End: 2016-09-21

## 2016-09-19 MED ORDER — CEFTRIAXONE SODIUM-DEXTROSE 2-2.22 GM-% IV SOLR
2.0000 g | Freq: Once | INTRAVENOUS | Status: DC
  Filled 2016-09-19: qty 50

## 2016-09-19 MED ORDER — PROMETHAZINE HCL 25 MG/ML IJ SOLN
12.5000 mg | Freq: Once | INTRAMUSCULAR | Status: AC
  Administered 2016-09-19: 12.5 mg via INTRAVENOUS
  Filled 2016-09-19: qty 1

## 2016-09-19 MED ORDER — ONDANSETRON HCL 4 MG/2ML IJ SOLN
4.0000 mg | Freq: Four times a day (QID) | INTRAMUSCULAR | Status: DC | PRN
  Administered 2016-09-20 – 2016-09-29 (×8): 4 mg via INTRAVENOUS
  Filled 2016-09-19 (×7): qty 2

## 2016-09-19 MED ORDER — HYDROMORPHONE HCL 1 MG/ML IJ SOLN
1.0000 mg | INTRAMUSCULAR | Status: DC | PRN
  Administered 2016-09-19 – 2016-09-21 (×10): 1 mg via INTRAVENOUS
  Filled 2016-09-19 (×10): qty 1

## 2016-09-19 MED ORDER — ACETAMINOPHEN 650 MG RE SUPP
650.0000 mg | Freq: Four times a day (QID) | RECTAL | Status: DC | PRN
Start: 2016-09-19 — End: 2016-09-21

## 2016-09-19 MED ORDER — POTASSIUM CHLORIDE IN NACL 20-0.9 MEQ/L-% IV SOLN
INTRAVENOUS | Status: DC
  Administered 2016-09-19 – 2016-09-21 (×3): via INTRAVENOUS
  Filled 2016-09-19 (×6): qty 1000

## 2016-09-19 MED ORDER — IOPAMIDOL (ISOVUE-300) INJECTION 61%
15.0000 mL | INTRAVENOUS | Status: AC
  Administered 2016-09-19 (×2): 15 mL via ORAL

## 2016-09-19 MED ORDER — FAMOTIDINE IN NACL 20-0.9 MG/50ML-% IV SOLN
20.0000 mg | Freq: Two times a day (BID) | INTRAVENOUS | Status: DC
  Administered 2016-09-19 (×2): 20 mg via INTRAVENOUS
  Filled 2016-09-19 (×4): qty 50

## 2016-09-19 MED ORDER — ACETAMINOPHEN 325 MG PO TABS: 650.0000 mg | ORAL_TABLET | Freq: Four times a day (QID) | ORAL | Status: DC | PRN

## 2016-09-19 MED ORDER — ONDANSETRON HCL 4 MG PO TABS
4.0000 mg | ORAL_TABLET | Freq: Four times a day (QID) | ORAL | Status: DC | PRN
  Administered 2016-10-06: 4 mg via ORAL
  Filled 2016-09-19: qty 1

## 2016-09-19 MED ORDER — FENTANYL CITRATE (PF) 100 MCG/2ML IJ SOLN
100.0000 ug | INTRAMUSCULAR | Status: DC | PRN
  Administered 2016-09-19: 100 ug via INTRAVENOUS
  Filled 2016-09-19: qty 2

## 2016-09-19 MED ORDER — METRONIDAZOLE IN NACL 5-0.79 MG/ML-% IV SOLN
500.0000 mg | Freq: Three times a day (TID) | INTRAVENOUS | Status: DC
  Administered 2016-09-19 – 2016-09-21 (×6): 500 mg via INTRAVENOUS
  Filled 2016-09-19 (×8): qty 100

## 2016-09-19 MED ORDER — IOPAMIDOL (ISOVUE-300) INJECTION 61%: 30.0000 mL | Freq: Once | INTRAVENOUS | Status: DC | PRN

## 2016-09-19 MED ORDER — HEPARIN SODIUM (PORCINE) 5000 UNIT/ML IJ SOLN
5000.0000 [IU] | Freq: Three times a day (TID) | INTRAMUSCULAR | Status: DC
  Administered 2016-09-19 – 2016-09-29 (×26): 5000 [IU] via SUBCUTANEOUS
  Filled 2016-09-19 (×27): qty 1

## 2016-09-19 MED ORDER — METRONIDAZOLE IN NACL 5-0.79 MG/ML-% IV SOLN: 500.0000 mg | Freq: Once | INTRAVENOUS | Status: DC

## 2016-09-19 NOTE — H&P (Signed)
History and Physical    Ruth Gray BBC:[id number] DOB: 06-06-1946 DOA: 09/19/2016  Referring physician: Dr. Roxan Hockey PCP: Mick Sell, MD  Specialists: Dr. Lavenia Atlas  Chief Complaint: abdominal pain with N/V  HPI: Ruth Gray is a 70 y.o. female has a past medical history significant for RA on immunosupressive therapy seen in clinic last week for abdominal pain. She was placed on po bactrim and Flagyl due to PCN and Cipro allergy. Presents today with persistent abdominal pain with intractable N/V, unable to keep po meds down. CT shows diverticulitis vs colitis. She is now admitted. Denies diarrhea or bleeding. No fever. Denies CP or SOB.  Review of Systems: The patient denies anorexia, fever, weight loss,, vision loss, decreased hearing, hoarseness, chest pain, syncope, dyspnea on exertion, peripheral edema, balance deficits, hemoptysis,, melena, hematochezia, severe indigestion/heartburn, hematuria, incontinence, genital sores, muscle weakness, suspicious skin lesions, transient blindness, difficulty walking, depression, unusual weight change, abnormal bleeding, enlarged lymph nodes, angioedema, and breast masses.   Past Medical History:  Diagnosis Date  . Collagen vascular disease (HCC)   . Rheumatoid arteritis    Past Surgical History:  Procedure Laterality Date  . ABDOMINAL HYSTERECTOMY    . FOOT SURGERY    . REPLACEMENT TOTAL KNEE Left    Social History:  reports that she has been smoking Cigarettes.  She has been smoking about 0.50 packs per day. She has never used smokeless tobacco. She reports that she does not drink alcohol. Her drug history is not on file.  Allergies  Allergen Reactions  . Ciprofloxacin Swelling  . Penicillins Swelling and Rash    Has patient had a PCN reaction causing immediate rash, facial/tongue/throat swelling, SOB or lightheadedness with hypotension: {no Has patient had a PCN reaction causing severe rash involving mucus membranes  or skin necrosis: no Has patient had a PCN reaction that required hospitalization no Has patient had a PCN reaction occurring within the last 10 years: no If all of the above answers are "NO", then may proceed with Cephalosporin use.     Family History  Problem Relation Age of Onset  . Breast cancer Other 36    Prior to Admission medications   Medication Sig Start Date End Date Taking? Authorizing Provider  Calcium Carb-Cholecalciferol (CALCIUM + D3 PO) Take by mouth.   Yes Historical Provider, MD  InFLIXimab (REMICADE IV) Inject into the vein once a week.   Yes Historical Provider, MD  Methotrexate, Anti-Rheumatic, (METHOTREXATE, PF, [place]) Inject into the skin.   Yes Historical Provider, MD  metroNIDAZOLE (FLAGYL) 500 MG tablet Take 500 mg by mouth 2 (two) times daily. x10 days d/c 09/28/16 09/17/16  Yes Historical Provider, MD  sulfamethoxazole-trimethoprim (BACTRIM DS,SEPTRA DS) 800-160 MG tablet Take 1 tablet by mouth 2 (two) times daily. X 10 days. D/c 09/28/16 09/17/16  Yes Historical Provider, MD   Physical Exam: Vitals:   09/19/16 1134 09/19/16 1200 09/19/16 1300 09/19/16 1314  BP: (!) 122/101 (!) 127/97 127/80 105/73  Pulse: (!) 113 (!) 103 100 (!) 109  Resp: 16 18 (!) 22 16  Temp:      TempSrc:      SpO2: 97% 99% 100% 99%  Weight:      Height:         General:  No apparent distress, WDWN, [place]/AT  Eyes: PERRL, EOMI, no scleral icterus, conjunctiva clear  ENT: moist oropharynx without exudate, TM's benign, dentition good  Neck: supple, no lymphadenopathy. No bruits or thyromegaly  Cardiovascular: regular  rate without MRG; 2+ peripheral pulses, no JVD, no peripheral edema  Respiratory: CTA biL, good air movement without wheezing, rhonchi or crackled. Respiratory effort normal  Abdomen: soft,  tender to palpation diffusely, positive bowel sounds, no guarding, no rebound  Skin: no rashes or lesions  Musculoskeletal: normal bulk and tone, no joint  swelling  Psychiatric: normal mood and affect, A&OX3  Neurologic: CN 2-12 grossly intact, Motor strength 5/5 in all 4 groups with symmetric DTR's and non-focal sensory exam  Labs on Admission:  Basic Metabolic Panel:  Recent Labs Lab 09/19/16 1028  NA 132*  K 3.4*  CL 99*  CO2 21*  GLUCOSE 103*  BUN 14  CREATININE 1.07*  CALCIUM 8.9   Liver Function Tests:  Recent Labs Lab 09/19/16 1028  AST 31  ALT 19  ALKPHOS 83  BILITOT 0.7  PROT 8.0  ALBUMIN 3.7    Recent Labs Lab 09/19/16 1028  LIPASE 18   No results for input(s): AMMONIA in the last 168 hours. CBC:  Recent Labs Lab 09/19/16 1028  WBC 9.1  HGB 13.7  HCT 40.2  MCV 94.7  PLT 259   Cardiac Enzymes: No results for input(s): CKTOTAL, CKMB, CKMBINDEX, TROPONINI in the last 168 hours.  BNP (last 3 results) No results for input(s): BNP in the last 8760 hours.  ProBNP (last 3 results) No results for input(s): PROBNP in the last 8760 hours.  CBG: No results for input(s): GLUCAP in the last 168 hours.  Radiological Exams on Admission: Ct Abdomen Pelvis W Contrast  Result Date: 09/19/2016 CLINICAL DATA:  Acute generalized abdominal pain. EXAM: CT ABDOMEN AND PELVIS WITH CONTRAST TECHNIQUE: Multidetector CT imaging of the abdomen and pelvis was performed using the standard protocol following bolus administration of intravenous contrast. CONTRAST:  ISOVUE-300 IOPAMIDOL (ISOVUE-300) INJECTION 61% COMPARISON:  None available currently. FINDINGS: Lower chest: Mild bilateral posterior basilar subsegmental atelectasis is noted. Hepatobiliary: No gallstones are noted. Small cyst is seen in right hepatic lobe. No other abnormality seen in the liver. Pancreas: Normal. Spleen: Normal. Adrenals/Urinary Tract: Adrenal glands and kidneys appear normal. No hydronephrosis or renal obstruction is noted. Urinary bladder appears normal. Stomach/Bowel: No small bowel dilatation is noted. Wall thickening of sigmoid colon  is noted with surrounding inflammation suggesting colitis or diverticulitis. The appendix appears normal. Diverticulosis of descending and sigmoid colon is noted. The more proximal colon is dilated and air-filled. Vascular/Lymphatic: Atherosclerosis of abdominal aorta is noted without aneurysm formation. No significant adenopathy is noted. Reproductive: Status post hysterectomy. Other: No abnormal fluid collection is noted. Musculoskeletal: Severe multilevel degenerative disc disease is noted in the visualized portions of the thoracic and lumbar spine. IMPRESSION: Aortic atherosclerosis. Diverticulosis of descending and proximal sigmoid colon is noted. Diffuse wall thickening of sigmoid colon is noted with surrounding inflammation most consistent with diverticulitis or colitis. The more proximal colon is dilated and air-filled suggesting ileus or possibly distal colonic obstruction. Electronically Signed   By: Lupita Raider, M.D.   On: 09/19/2016 14:14    EKG: Independently reviewed.  Assessment/Plan Principal Problem:   Acute diverticulitis Active Problems:   Abdominal pain   Intractable nausea and vomiting   Rheumatoid arthritis (HCC)   Will admit to floor with IV fluids and IV ABX. Clear liquid diet. Consult GI. Supplement K+. Repeat labs in AM. Consider IV steroids if no better.  Diet: clear liquids Fluids: NS with K+@100  DVT Prophylaxis: SQ Heparin  Code Status: FULL  Family Communication: none  Disposition Plan: home  Time spent: 55 min

## 2016-09-19 NOTE — ED Notes (Signed)
Hooked pt back up to monitor 

## 2016-09-19 NOTE — ED Notes (Signed)
Helped pt to bathroom. 

## 2016-09-19 NOTE — ED Triage Notes (Signed)
Pt reports Diagnosed with Diverticulitis on Thursday. Currently taking abx but unable to keep any thing down due to vomiting. Pain in lower abd 8/10 Pt also complains constipation states she has not had a bowel movement since Thursday.

## 2016-09-19 NOTE — ED Provider Notes (Signed)
[facility] Emergency Department Provider Note    First MD Initiated Contact with Patient 09/19/16 1056     (approximate)  I have reviewed the triage vital signs and the nursing notes.   HISTORY  Chief Complaint Emesis and Abdominal Pain    HPI Ruth Gray is a 70 y.o. female history of rheumatoid arthritis on methotrexate injections every other week presents with gradually worsening abdominal pain nausea and vomiting since Thursday. The patient went to her GP on Thursday was given prescription for Bactrim and Cipro. She's been unable to keep antibiotics down due to persistent pain and nausea and vomiting. She describes the emesis is nonbloody and nonbilious. She's not had any bowel movements but states that she is passing gas. She reports 8 out of 10 pain in bilateral lower abdomen with the left being greater than the right.   Past Medical History:  Diagnosis Date  . Rheumatoid arteritis     There are no active problems to display for this patient.   Past Surgical History:  Procedure Laterality Date  . ABDOMINAL HYSTERECTOMY    . FOOT SURGERY    . REPLACEMENT TOTAL KNEE Left     Prior to Admission medications   Medication Sig Start Date End Date Taking? Authorizing Provider  azithromycin (ZITHROMAX Z-PAK) 250 MG tablet Take 2 tablets (500 mg) on  Day 1,  followed by 1 tablet (250 mg) once daily on Days 2 through 5. 04/19/16   Renford Dills, NP  Calcium Carb-Cholecalciferol (CALCIUM + D3 PO) Take by mouth.    Historical Provider, MD  InFLIXimab (REMICADE IV) Inject into the vein once a week.    Historical Provider, MD  Methotrexate, Anti-Rheumatic, (METHOTREXATE, PF, [place]) Inject into the skin.    Historical Provider, MD  oxyCODONE-acetaminophen (ROXICET) 5-325 MG tablet Take 0.5 tablets by mouth every 8 (eight) hours as needed for moderate pain or severe pain (take 0.5-1 tablet as needed; Do not drive or operate heavy machinery while taking as can  cause drowsiness.). 05/23/16   Renford Dills, NP    Allergies Penicillins  Family History  Problem Relation Age of Onset  . Breast cancer Other 59    Social History Social History  Substance Use Topics  . Smoking status: Current Every Day Smoker    Packs/day: 0.50    Types: Cigarettes  . Smokeless tobacco: Not on file  . Alcohol use No    Review of Systems Patient denies headaches, rhinorrhea, blurry vision, numbness, shortness of breath, chest pain, edema, cough, abdominal pain, nausea, vomiting, diarrhea, dysuria, fevers, rashes or hallucinations unless otherwise stated above in HPI. ____________________________________________   PHYSICAL EXAM:  VITAL SIGNS: Vitals:   09/19/16 1023  BP: 120/81  Pulse: (!) 125  Resp: (!) 22  Temp: 98.2 F (36.8 C)   Constitutional: Alert and oriented. ill appearing and in no acute distress. Eyes: Conjunctivae are normal. PERRL. EOMI. Head: Atraumatic. Nose: No congestion/rhinnorhea. Mouth/Throat: Mucous membranes are moist.  Oropharynx non-erythematous. Neck: No stridor. Painless ROM. No cervical spine tenderness to palpation Hematological/Lymphatic/Immunilogical: No cervical lymphadenopathy. Cardiovascular: Normal rate, regular rhythm. Grossly normal heart sounds.  Good peripheral circulation. Respiratory: Normal respiratory effort.  No retractions. Lungs CTAB. Gastrointestinal: Soft but with diffuse ttp with LLQ the most significant, no peritonitis. No distention. No abdominal bruits. No CVA tenderness. Musculoskeletal: No lower extremity tenderness nor edema.  No joint effusions. Neurologic:  Normal speech and language. No gross focal neurologic deficits are appreciated. No gait instability. Skin:  Skin is warm, dry and intact. No rash noted. Psychiatric: Mood and affect are normal. Speech and behavior are normal.  ____________________________________________   LABS (all labs ordered are listed, but only abnormal results are  displayed)  Results for orders placed or performed during the hospital encounter of 09/19/16 (from the past 24 hour(s))  CBC     Status: Abnormal   Collection Time: 09/19/16 10:28 AM  Result Value Ref Range   WBC 9.1 3.6 - 11.0 K/uL   RBC 4.24 3.80 - 5.20 MIL/uL   Hemoglobin 13.7 12.0 - 16.0 g/dL   HCT 83.1 51.7 - 61.6 %   MCV 94.7 80.0 - 100.0 fL   MCH 32.3 26.0 - 34.0 pg   MCHC 34.1 32.0 - 36.0 g/dL   RDW 07.3 (H) 71.0 - 62.6 %   Platelets 259 150 - 440 K/uL   ____________________________________________   ____________________________________________  RADIOLOGY  I personally reviewed all radiographic images ordered to evaluate for the above acute complaints and reviewed radiology reports and findings.  These findings were personally discussed with the patient.  Please see medical record for radiology report.  ____________________________________________   PROCEDURES  Procedure(s) performed: none    Critical Care performed: no ____________________________________________   INITIAL IMPRESSION / ASSESSMENT AND PLAN / ED COURSE  Pertinent labs & imaging results that were available during my care of the patient were reviewed by me and considered in my medical decision making (see chart for details).  DDX: diverticulitis, abscess, perf, sbo, mass, colitis  MARIELLA BLACKWELDER is a 70 y.o. who presents to the ED with progressively worsening abdominal pain associated with nausea and vomiting. Patient is afebrile but tachycardic and does appear currently dehydrated and very uncomfortable. Based on her presentation will order laboratory evaluation as she is immunocompromised. Will order CT imaging of the belly to evaluate for possible worsening diverticulitis versus abscess, SBO or colitis.  The patient will be placed on continuous pulse oximetry and telemetry for monitoring.  Laboratory evaluation will be sent to evaluate for the above complaints.     Clinical Course  Comment By  Time  Patient still normal-appearing CT imaging does show evidence of diverticulitis with colitis but no evidence of abscess or perforation. Patient given IV antibiotics. Patient received IV fluids but still with tachycardia. Given her persistent nausea and vomiting with severe pain do feel patient will require admission for IV antibiotics and IV symptomatically control.  Have discussed with the patient and available family all diagnostics and treatments performed thus far and all questions were answered to the best of my ability. The patient demonstrates understanding and agreement with plan.  Willy Eddy, MD 10/29 1451     ____________________________________________   FINAL CLINICAL IMPRESSION(S) / ED DIAGNOSES  Final diagnoses:  Diverticulitis of large intestine without perforation or abscess without bleeding  Tachycardia  Lower abdominal pain      NEW MEDICATIONS STARTED DURING THIS VISIT:  New Prescriptions   No medications on file     Note:  This document was prepared using Dragon voice recognition software and may include unintentional dictation errors.    Willy Eddy, MD 09/19/16 [phone]

## 2016-09-20 ENCOUNTER — Inpatient Hospital Stay: Payer: Medicare Other

## 2016-09-20 DIAGNOSIS — K5732 Diverticulitis of large intestine without perforation or abscess without bleeding: Secondary | ICD-10-CM

## 2016-09-20 LAB — COMPREHENSIVE METABOLIC PANEL
ALK PHOS: 67 U/L (ref 38–126)
ALT: 18 U/L (ref 14–54)
ANION GAP: 7 (ref 5–15)
AST: 25 U/L (ref 15–41)
Albumin: 3 g/dL — ABNORMAL LOW (ref 3.5–5.0)
BILIRUBIN TOTAL: 0.7 mg/dL (ref 0.3–1.2)
BUN: 14 mg/dL (ref 6–20)
CALCIUM: 8 mg/dL — AB (ref 8.9–10.3)
CO2: 20 mmol/L — AB (ref 22–32)
CREATININE: 0.83 mg/dL (ref 0.44–1.00)
Chloride: 109 mmol/L (ref 101–111)
Glucose, Bld: 97 mg/dL (ref 65–99)
Potassium: 3.3 mmol/L — ABNORMAL LOW (ref 3.5–5.1)
SODIUM: 136 mmol/L (ref 135–145)
TOTAL PROTEIN: 6.4 g/dL — AB (ref 6.5–8.1)

## 2016-09-20 LAB — CBC
HCT: 34.9 % — ABNORMAL LOW (ref 35.0–47.0)
HEMOGLOBIN: 11.8 g/dL — AB (ref 12.0–16.0)
MCH: 32.4 pg (ref 26.0–34.0)
MCHC: 33.9 g/dL (ref 32.0–36.0)
MCV: 95.5 fL (ref 80.0–100.0)
PLATELETS: 229 10*3/uL (ref 150–440)
RBC: 3.65 MIL/uL — AB (ref 3.80–5.20)
RDW: 14.9 % — ABNORMAL HIGH (ref 11.5–14.5)
WBC: 6.4 10*3/uL (ref 3.6–11.0)

## 2016-09-20 MED ORDER — POLYETHYLENE GLYCOL 3350 17 G PO PACK
17.0000 g | PACK | Freq: Every day | ORAL | Status: DC
  Administered 2016-09-20: 17 g via ORAL
  Filled 2016-09-20: qty 1

## 2016-09-20 MED ORDER — POTASSIUM CHLORIDE CRYS ER 20 MEQ PO TBCR
40.0000 meq | EXTENDED_RELEASE_TABLET | Freq: Once | ORAL | Status: AC
  Administered 2016-09-20: 40 meq via ORAL
  Filled 2016-09-20: qty 2

## 2016-09-20 MED ORDER — FAMOTIDINE 20 MG PO TABS
20.0000 mg | ORAL_TABLET | Freq: Two times a day (BID) | ORAL | Status: DC
  Administered 2016-09-20 (×2): 20 mg via ORAL
  Filled 2016-09-20 (×2): qty 1

## 2016-09-20 NOTE — Progress Notes (Signed)
[facility] - [place] [facility]   PATIENT NAME: Ruth Gray    MR#:  [id number]  DATE OF BIRTH:  1946/01/31  SUBJECTIVE:   Patient still with some nausea, vomiting and abdominal pain  REVIEW OF SYSTEMS:    Review of Systems  Constitutional: Negative.  Negative for chills, fever and malaise/fatigue.  HENT: Negative.  Negative for ear discharge, ear pain, hearing loss, nosebleeds and sore throat.   Eyes: Negative.  Negative for blurred vision and pain.  Respiratory: Negative.  Negative for cough, hemoptysis, shortness of breath and wheezing.   Cardiovascular: Negative.  Negative for chest pain, palpitations and leg swelling.  Gastrointestinal: Positive for abdominal pain, nausea and vomiting. Negative for blood in stool and diarrhea.  Genitourinary: Negative.  Negative for dysuria.  Musculoskeletal: Negative.  Negative for back pain.  Skin: Negative.   Neurological: Negative for dizziness, tremors, speech change, focal weakness, seizures and headaches.  Endo/Heme/Allergies: Negative.  Does not bruise/bleed easily.  Psychiatric/Behavioral: Negative.  Negative for depression, hallucinations and suicidal ideas.    Tolerating Diet: no      DRUG ALLERGIES:   Allergies  Allergen Reactions  . Ciprofloxacin Swelling  . Penicillins Swelling and Rash    Has patient had a PCN reaction causing immediate rash, facial/tongue/throat swelling, SOB or lightheadedness with hypotension: {no Has patient had a PCN reaction causing severe rash involving mucus membranes or skin necrosis: no Has patient had a PCN reaction that required hospitalization no Has patient had a PCN reaction occurring within the last 10 years: no If all of the above answers are "NO", then may proceed with Cephalosporin use.     VITALS:  Blood pressure 131/79, pulse 98, temperature 99.2 F (37.3 C), temperature source Oral, resp. rate 20, height 5\' 7"  (1.702 m), weight 87.6 kg (193 lb 1.6 oz),  SpO2 92 %.  PHYSICAL EXAMINATION:   Physical Exam  Constitutional: She is oriented to person, place, and time and well-developed, well-nourished, and in no distress. No distress.  HENT:  Head: Normocephalic.  Eyes: No scleral icterus.  Neck: Normal range of motion. Neck supple. No JVD present. No tracheal deviation present.  Cardiovascular: Normal rate and regular rhythm.  Exam reveals no gallop and no friction rub.   Murmur heard. Pulmonary/Chest: Effort normal and breath sounds normal. No respiratory distress. She has no wheezes. She has no rales. She exhibits no tenderness.  Abdominal: Soft. She exhibits distension. She exhibits no mass. There is no tenderness. There is no rebound and no guarding.  Hypoactive bowel sounds  Musculoskeletal: Normal range of motion. She exhibits no edema.  Neurological: She is alert and oriented to person, place, and time.  Skin: Skin is warm. No rash noted. No erythema.  Psychiatric: Affect and judgment normal.      LABORATORY PANEL:   CBC  Recent Labs Lab 09/20/16 0508  WBC 6.4  HGB 11.8*  HCT 34.9*  PLT 229   ------------------------------------------------------------------------------------------------------------------  Chemistries   Recent Labs Lab 09/20/16 0508  NA 136  K 3.3*  CL 109  CO2 20*  GLUCOSE 97  BUN 14  CREATININE 0.83  CALCIUM 8.0*  AST 25  ALT 18  ALKPHOS 67  BILITOT 0.7   ------------------------------------------------------------------------------------------------------------------  Cardiac Enzymes No results for input(s): TROPONINI in the last 168 hours. ------------------------------------------------------------------------------------------------------------------  RADIOLOGY:  Dg Abd 1 View  Result Date: 09/20/2016 CLINICAL DATA:  70 year old female with abdominal pain, nausea and vomiting EXAM: ABDOMEN - 1 VIEW COMPARISON:  CT abdomen/ pelvis  09/19/2016 FINDINGS: Persistent marked cecal  distention. There is gentle tapering of the gas-filled sigmoid colon at the region of recently identified inflammatory thickening. No evidence of free air scratch then no evidence of large free air on this supine series. The lung bases are clear. No acute osseous abnormality. Mild rotary dextro convex scoliosis with multilevel lumbar degenerative disc disease. IMPRESSION: 1. Persisting gaseous distension of the cecum likely secondary to ileus. 2. Tapering of the gas column in the sigmoid colon in the region of recently reported inflammatory wall thickening. Electronically Signed   By: Malachy Moan M.D.   On: 09/20/2016 08:37   Ct Abdomen Pelvis W Contrast  Result Date: 09/19/2016 CLINICAL DATA:  Acute generalized abdominal pain. EXAM: CT ABDOMEN AND PELVIS WITH CONTRAST TECHNIQUE: Multidetector CT imaging of the abdomen and pelvis was performed using the standard protocol following bolus administration of intravenous contrast. CONTRAST:  ISOVUE-300 IOPAMIDOL (ISOVUE-300) INJECTION 61% COMPARISON:  None available currently. FINDINGS: Lower chest: Mild bilateral posterior basilar subsegmental atelectasis is noted. Hepatobiliary: No gallstones are noted. Small cyst is seen in right hepatic lobe. No other abnormality seen in the liver. Pancreas: Normal. Spleen: Normal. Adrenals/Urinary Tract: Adrenal glands and kidneys appear normal. No hydronephrosis or renal obstruction is noted. Urinary bladder appears normal. Stomach/Bowel: No small bowel dilatation is noted. Wall thickening of sigmoid colon is noted with surrounding inflammation suggesting colitis or diverticulitis. The appendix appears normal. Diverticulosis of descending and sigmoid colon is noted. The more proximal colon is dilated and air-filled. Vascular/Lymphatic: Atherosclerosis of abdominal aorta is noted without aneurysm formation. No significant adenopathy is noted. Reproductive: Status post hysterectomy. Other: No abnormal fluid  collection is noted. Musculoskeletal: Severe multilevel degenerative disc disease is noted in the visualized portions of the thoracic and lumbar spine. IMPRESSION: Aortic atherosclerosis. Diverticulosis of descending and proximal sigmoid colon is noted. Diffuse wall thickening of sigmoid colon is noted with surrounding inflammation most consistent with diverticulitis or colitis. The more proximal colon is dilated and air-filled suggesting ileus or possibly distal colonic obstruction. Electronically Signed   By: Lupita Raider, M.D.   On: 09/19/2016 14:14     ASSESSMENT AND PLAN:    70 year old female with a history of rheumatoid arthritis on immunosuppressive medications who presents with abdominal pain and found to have diverticulitis and ileus.  1. Acute abdominal pain with CT findings suggestive of diverticulitis versus colitis and ileus Continue IV antibiotics (aztreonam and Flagyl) and nothing by mouth status Surgical consultation for ileus with possible placement of NG tube. Start MiraLAX  2. Hypokalemia: Replete as this contributes to ileus.  3. History of rheumatoid arthritis: Medications on hold for now due to problem #1.    Management plans discussed with the patient and she is in agreement.  CODE STATUS: full  TOTAL TIME TAKING CARE OF THIS PATIENT: 30 minutes.     POSSIBLE D/C 2 days, DEPENDING ON CLINICAL CONDITION.   Zuleima Haser M.D on 09/20/2016 at 11:30 AM  Between 7am to 6pm - Pager - [phone] After 6pm go to [email or web address] - Social research officer, government  [facility]  Office  [phone]  CC: Primary care physician; FITZGERALD, DAVID Demetrius Charity, MD  Note: This dictation was prepared with Dragon dictation along with smaller phrase technology. Any transcriptional errors that result from this process are unintentional.

## 2016-09-20 NOTE — Progress Notes (Signed)
Patient handoff report given to [place], Charity fundraiser. Patient resting in bed.

## 2016-09-20 NOTE — Consult Note (Signed)
Ruth Gray is a 70 y.o. female  with abdominal pain and possible diverticulitis.  HPI: She is a pleasant woman with a history of rheumatoid arthritis who presents with abdominal pain nausea and vomiting. She has a history of diverticulitis treated as an outpatient with oral antibiotics. She began to have regular symptoms of that problem mid-level last week and saw her primary care doctor. He placed her on oral antibiotics but she did not improve. She became increasingly nauseated and began to vomit. She had no fever or chills. Pain was worse on the left side extending down over her suprapubic area. She presented to the emergency room for further evaluation. She did not have a significant elevation her white blood cell count but abdominal CT scan revealed narrowing in her sigmoid colon consistent with inflammatory change and possible diverticulitis. No extraluminal air was identified. She was admitted for intravenous antibiotics.  She's had multiple previous episodes none of which are severe as the current episode. She denies any history of hepatitis yellow jaundice pancreatitis peptic ulcer disease or gallbladder disease. She does have a history of hysterectomy. 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  Since she's been hospitalized she has been dry heaving pretty regularly but has not had any vomiting. She is on IV antibiotics and taking nothing by mouth.  Past Medical History:  Diagnosis Date  . Collagen vascular disease (HCC)   . Rheumatoid arteritis    Past Surgical History:  Procedure Laterality Date  . ABDOMINAL HYSTERECTOMY    . FOOT SURGERY    . REPLACEMENT TOTAL KNEE Left    Social History   Social History  . Marital status: Widowed    Spouse name: N/A  . Number of children: N/A  . Years of education: N/A   Social History Main Topics  . Smoking status: Current Every Day Smoker    Packs/day: 0.50    Types: Cigarettes  . Smokeless tobacco: Never Used  . Alcohol use No  . Drug  use: Unknown  . Sexual activity: Not Asked   Other Topics Concern  . None   Social History Narrative  . None    Review of Systems: Review of Systems  Constitutional: Positive for malaise/fatigue. Negative for chills, diaphoresis, fever and weight loss.  HENT: Negative.   Eyes: Negative.   Respiratory: Negative for cough, sputum production and shortness of breath.   Cardiovascular: Negative for chest pain, palpitations and orthopnea.  Gastrointestinal: Positive for abdominal pain, nausea and vomiting. Negative for heartburn.  Genitourinary: Negative.   Musculoskeletal: Positive for joint pain and myalgias.  Skin: Negative.   Neurological: Negative.   Psychiatric/Behavioral: Negative.     PHYSICAL EXAM: BP 131/79 (BP Location: Left Arm)   Pulse 98   Temp 99.2 F (37.3 C) (Oral)   Resp 20   Ht 5\' 7"  (1.702 m)   Wt 87.6 kg (193 lb 1.6 oz)   SpO2 92%   BMI 30.24 kg/m   Physical Exam  Constitutional: She is oriented to person, place, and time. She appears well-developed and well-nourished. No distress.  HENT:  Head: Normocephalic and atraumatic.  Eyes: EOM are normal. Pupils are equal, round, and reactive to light.  Neck: Normal range of motion. Neck supple.  Cardiovascular: Normal rate and regular rhythm.   Pulmonary/Chest: Effort normal and breath sounds normal.  Abdominal: Soft. Bowel sounds are normal. She exhibits no distension and no mass. There is tenderness. There is guarding.  Musculoskeletal: Normal range of motion. She exhibits no edema  or deformity.  Neurological: She is alert and oriented to person, place, and time.  Skin: Skin is warm and dry. She is not diaphoretic.  Psychiatric: She has a normal mood and affect. Her behavior is normal.   Her abdomen is slightly distended with mild left lower quadrant suprapubic tenderness. She does not have any significant rebound but she does have some mild guarding.  Impression/Plan: I have independently reviewed her CT  scan. She does appear to have a partial bowel obstruction likely related to her sigmoid colon. She did have a colonoscopy 3 years ago which did not demonstrate anything obstructive at that time. She may have some stenosis and sclerotic changes in that portion of about which are responsible for the partial obstruction. I would treat her with anti-emetics continue her IV antibiotics and repeat her CT scan if she does not improve. We discussed possibility of surgical resection. Surgery would likely involve a possible temporary colostomy. This plan was outlined to the patient and her son.   Tiney Rouge III, MD  09/20/2016, 12:09 PM

## 2016-09-20 NOTE — Consult Note (Signed)
Consultation  Referring Provider:     No ref. provider found Primary Care Physician:  FITZGERALD, DAVID Demetrius Charity, MD Primary Gastroenterologist:  Dr.Eyleen Rawlinson         Reason for Consultation:     Abdominal pain with an abnormal CT .  Date of Admission:  09/19/2016 Date of Consultation:  09/20/2016         HPI:   Ruth Gray is a 70 y.o. female with abdominal pain and an abnormal abdominal CT constant with possible diverticulosis. She noted nausea on admission ,but none now. She was admitted with an elevated WBC which  decreased since admission  Past Medical History:  Diagnosis Date  . Collagen vascular disease (HCC)   . Rheumatoid arteritis     Past Surgical History:  Procedure Laterality Date  . ABDOMINAL HYSTERECTOMY    . FOOT SURGERY    . REPLACEMENT TOTAL KNEE Left     Prior to Admission medications   Medication Sig Start Date End Date Taking? Authorizing Provider  Calcium Carb-Cholecalciferol (CALCIUM + D3 PO) Take by mouth.   Yes Historical Provider, MD  InFLIXimab (REMICADE IV) Inject into the vein once a week.   Yes Historical Provider, MD  Methotrexate, Anti-Rheumatic, (METHOTREXATE, PF, [place]) Inject into the skin.   Yes Historical Provider, MD  metroNIDAZOLE (FLAGYL) 500 MG tablet Take 500 mg by mouth 2 (two) times daily. x10 days d/c 09/28/16 09/17/16  Yes Historical Provider, MD  sulfamethoxazole-trimethoprim (BACTRIM DS,SEPTRA DS) 800-160 MG tablet Take 1 tablet by mouth 2 (two) times daily. X 10 days. D/c 09/28/16 09/17/16  Yes Historical Provider, MD    Family History  Problem Relation Age of Onset  . Breast cancer Other 51     Social History  Substance Use Topics  . Smoking status: Current Every Day Smoker    Packs/day: 0.50    Types: Cigarettes  . Smokeless tobacco: Never Used  . Alcohol use No    Allergies as of 09/19/2016 - Review Complete 09/19/2016  Allergen Reaction Noted  . Ciprofloxacin Swelling 09/19/2016  . Penicillins Swelling and Rash 05/29/2015     Review of Systems:    All systems reviewed and negative except where noted in HPI.   Physical Exam:  Vital signs in last 24 hours: Temp:  [98 F (36.7 C)-99.2 F (37.3 C)] 98.6 F (37 C) (10/30 1245) Pulse Rate:  [91-109] 91 (10/30 1245) Resp:  [20] 20 (10/30 0521) BP: (124-140)/(76-83) 124/77 (10/30 1245) SpO2:  [92 %-99 %] 95 % (10/30 1245) Weight:  [193 lb 1.6 oz (87.6 kg)] 193 lb 1.6 oz (87.6 kg) (10/30 0300) Last BM Date: 09/15/16 General:   Pleasant, cooperative in NAD Head:  Normocephalic and atraumatic. Eyes:   No icterus.   Conjunctiva pink. PERRLA. Ears:  Normal auditory acuity. Neck:  Supple; no masses or thyroidomegaly Lungs: Respirations even and unlabored. Lungs clear to auscultation bilaterally.   No wheezes, crackles, or rhonchi.  Heart:  Regular rate and rhythm;  Without murmur, clicks, rubs or gallops Abdomen:  Soft, nondistended, nontender. Normal bowel sounds. No appreciable masses or hepatomegaly.  No rebound or guarding.  Rectal:  Not performed. Msk:  Symmetrical without gross deformities.  Strength normal .  Extremities:  Without edema, cyanosis or clubbing. Neurologic:  Alert and oriented x3;  grossly normal neurologically. Skin:  Intact without significant lesions or rashes. Cervical Nodes:  No significant cervical adenopathy. Psych:  Alert and cooperative. Normal affect.  LAB RESULTS:  Recent Labs  09/19/16 1028  09/20/16 0508  WBC 9.1 6.4  HGB 13.7 11.8*  HCT 40.2 34.9*  PLT 259 229   BMET  Recent Labs  09/19/16 1028 09/20/16 0508  NA 132* 136  K 3.4* 3.3*  CL 99* 109  CO2 21* 20*  GLUCOSE 103* 97  BUN 14 14  CREATININE 1.07* 0.83  CALCIUM 8.9 8.0*   LFT  Recent Labs  09/20/16 0508  PROT 6.4*  ALBUMIN 3.0*  AST 25  ALT 18  ALKPHOS 67  BILITOT 0.7   PT/INR No results for input(s): LABPROT, INR in the last 72 hours.  STUDIES: Dg Abd 1 View  Result Date: 09/20/2016 CLINICAL DATA:  70 year old female with  abdominal pain, nausea and vomiting EXAM: ABDOMEN - 1 VIEW COMPARISON:  CT abdomen/ pelvis 09/19/2016 FINDINGS: Persistent marked cecal distention. There is gentle tapering of the gas-filled sigmoid colon at the region of recently identified inflammatory thickening. No evidence of free air scratch then no evidence of large free air on this supine series. The lung bases are clear. No acute osseous abnormality. Mild rotary dextro convex scoliosis with multilevel lumbar degenerative disc disease. IMPRESSION: 1. Persisting gaseous distension of the cecum likely secondary to ileus. 2. Tapering of the gas column in the sigmoid colon in the region of recently reported inflammatory wall thickening. Electronically Signed   By: Malachy Moan M.D.   On: 09/20/2016 08:37   Ct Abdomen Pelvis W Contrast  Result Date: 09/19/2016 CLINICAL DATA:  Acute generalized abdominal pain. EXAM: CT ABDOMEN AND PELVIS WITH CONTRAST TECHNIQUE: Multidetector CT imaging of the abdomen and pelvis was performed using the standard protocol following bolus administration of intravenous contrast. CONTRAST:  ISOVUE-300 IOPAMIDOL (ISOVUE-300) INJECTION 61% COMPARISON:  None available currently. FINDINGS: Lower chest: Mild bilateral posterior basilar subsegmental atelectasis is noted. Hepatobiliary: No gallstones are noted. Small cyst is seen in right hepatic lobe. No other abnormality seen in the liver. Pancreas: Normal. Spleen: Normal. Adrenals/Urinary Tract: Adrenal glands and kidneys appear normal. No hydronephrosis or renal obstruction is noted. Urinary bladder appears normal. Stomach/Bowel: No small bowel dilatation is noted. Wall thickening of sigmoid colon is noted with surrounding inflammation suggesting colitis or diverticulitis. The appendix appears normal. Diverticulosis of descending and sigmoid colon is noted. The more proximal colon is dilated and air-filled. Vascular/Lymphatic: Atherosclerosis of abdominal aorta is noted  without aneurysm formation. No significant adenopathy is noted. Reproductive: Status post hysterectomy. Other: No abnormal fluid collection is noted. Musculoskeletal: Severe multilevel degenerative disc disease is noted in the visualized portions of the thoracic and lumbar spine. IMPRESSION: Aortic atherosclerosis. Diverticulosis of descending and proximal sigmoid colon is noted. Diffuse wall thickening of sigmoid colon is noted with surrounding inflammation most consistent with diverticulitis or colitis. The more proximal colon is dilated and air-filled suggesting ileus or possibly distal colonic obstruction. Electronically Signed   By: Lupita Raider, M.D.   On: 09/19/2016 14:14      Impression / Plan:   Ruth Gray is a 70 y.o. y/o female with symptoms and X ray finding consistent with diverticulitis. Plan - Current supportive therapy and antibiotics . Will not proceed with endoscopic evaluation at this time - will evaluate at a later date .   Thank you for involving me in the care of this patient.      LOS: 1 day   Ula Lingo, MD  09/20/2016, 2:37 PM   Note: This dictation was prepared with Dragon dictation along with smaller phrase technology. Any transcriptional errors that result  from this process are unintentional.

## 2016-09-20 NOTE — Progress Notes (Signed)
CONCERNING: IV to Oral Route Change Policy  RECOMMENDATION: This patient is receiving famotidine by the intravenous route.  Based on criteria approved by the Pharmacy and Therapeutics Committee, the intravenous medication(s) is/are being converted to the equivalent oral dose form(s).   DESCRIPTION: These criteria include:  The patient is eating (either orally or via tube) and/or has been taking other orally administered medications for a least 24 hours  The patient has no evidence of active gastrointestinal bleeding or impaired GI absorption (gastrectomy, short bowel, patient on TNA or NPO).  If you have questions about this conversion, please contact the Pharmacy Department  []   [phone] )  ( [phone] [x]   [phone] )  [facility] []   [phone] )  [facility] []   [phone] )  [facility] []   [phone] )  [facility]   Simpson,Michael L, [facility] 09/20/2016 9:51 AM

## 2016-09-21 ENCOUNTER — Encounter
Admission: EM | Disposition: A | Discharge status: Skilled Nursing Facility | Payer: Self-pay | Source: Home / Self Care | Attending: Surgery

## 2016-09-21 ENCOUNTER — Inpatient Hospital Stay: Payer: Medicare Other

## 2016-09-21 ENCOUNTER — Inpatient Hospital Stay: Payer: Medicare Other | Admitting: Anesthesiology

## 2016-09-21 DIAGNOSIS — K5792 Diverticulitis of intestine, part unspecified, without perforation or abscess without bleeding: Secondary | ICD-10-CM

## 2016-09-21 DIAGNOSIS — J9601 Acute respiratory failure with hypoxia: Secondary | ICD-10-CM

## 2016-09-21 DIAGNOSIS — K5732 Diverticulitis of large intestine without perforation or abscess without bleeding: Secondary | ICD-10-CM

## 2016-09-21 HISTORY — PX: LAPAROTOMY: SHX154

## 2016-09-21 HISTORY — PX: CENTRAL VENOUS CATHETER INSERTION: SHX401

## 2016-09-21 HISTORY — PX: COLON RESECTION: SHX5231

## 2016-09-21 LAB — BLOOD GAS, ARTERIAL
Acid-base deficit: 8.4 mmol/L — ABNORMAL HIGH (ref 0.0–2.0)
Bicarbonate: 17.9 mmol/L — ABNORMAL LOW (ref 20.0–28.0)
FIO2: 0.8
MECHVT: 450 mL
O2 Saturation: 98.1 %
PATIENT TEMPERATURE: 37
PEEP: 5 cmH2O
PO2 ART: 120 mmHg — AB (ref 83.0–108.0)
RATE: 15 resp/min
pCO2 arterial: 39 mmHg (ref 32.0–48.0)
pH, Arterial: 7.27 — ABNORMAL LOW (ref 7.350–7.450)

## 2016-09-21 LAB — BASIC METABOLIC PANEL
Anion gap: 7 (ref 5–15)
Anion gap: 8 (ref 5–15)
BUN: 21 mg/dL — AB (ref 6–20)
BUN: 23 mg/dL — AB (ref 6–20)
CALCIUM: 7.9 mg/dL — AB (ref 8.9–10.3)
CALCIUM: 7.9 mg/dL — AB (ref 8.9–10.3)
CO2: 19 mmol/L — AB (ref 22–32)
CO2: 20 mmol/L — AB (ref 22–32)
CREATININE: 1.31 mg/dL — AB (ref 0.44–1.00)
CREATININE: 1.75 mg/dL — AB (ref 0.44–1.00)
Chloride: 108 mmol/L (ref 101–111)
Chloride: 107 mmol/L (ref 101–111)
GFR calc non Af Amer: 41 mL/min — ABNORMAL LOW (ref >60)
GFR calc non Af Amer: 29 mL/min — ABNORMAL LOW (ref >60)
GFR, EST AFRICAN AMERICAN: 47 mL/min — AB (ref >60)
GFR, EST AFRICAN AMERICAN: 33 mL/min — AB (ref >60)
GLUCOSE: 116 mg/dL — AB (ref 65–99)
GLUCOSE: 112 mg/dL — AB (ref 65–99)
Potassium: 4.6 mmol/L (ref 3.5–5.1)
Potassium: 4.8 mmol/L (ref 3.5–5.1)
Sodium: 134 mmol/L — ABNORMAL LOW (ref 135–145)
Sodium: 135 mmol/L (ref 135–145)

## 2016-09-21 LAB — POCT I-STAT 4, (NA,K, GLUC, HGB,HCT)
Glucose, Bld: 115 mg/dL — ABNORMAL HIGH (ref 65–99)
HCT: 35 % — ABNORMAL LOW (ref 36.0–46.0)
HEMOGLOBIN: 11.9 g/dL — AB (ref 12.0–15.0)
POTASSIUM: 4.8 mmol/L (ref 3.5–5.1)
SODIUM: 137 mmol/L (ref 135–145)

## 2016-09-21 LAB — GLUCOSE, CAPILLARY
GLUCOSE-CAPILLARY: 199 mg/dL — AB (ref 65–99)
Glucose-Capillary: 165 mg/dL — ABNORMAL HIGH (ref 65–99)

## 2016-09-21 LAB — MRSA PCR SCREENING: MRSA BY PCR: NEGATIVE

## 2016-09-21 SURGERY — LAPAROTOMY, EXPLORATORY
Anesthesia: General | Wound class: Dirty or Infected

## 2016-09-21 MED ORDER — DEXAMETHASONE SODIUM PHOSPHATE 10 MG/ML IJ SOLN
INTRAMUSCULAR | Status: DC | PRN
  Administered 2016-09-21: 10 mg via INTRAVENOUS

## 2016-09-21 MED ORDER — HYDROMORPHONE HCL 1 MG/ML IJ SOLN
1.0000 mg | Freq: Once | INTRAMUSCULAR | Status: AC
  Administered 2016-09-21: 1 mg via INTRAVENOUS
  Filled 2016-09-21: qty 1

## 2016-09-21 MED ORDER — PROMETHAZINE HCL 25 MG/ML IJ SOLN: 6.2500 mg | INTRAMUSCULAR | Status: DC | PRN

## 2016-09-21 MED ORDER — MEPERIDINE HCL 25 MG/ML IJ SOLN: 6.2500 mg | INTRAMUSCULAR | Status: DC | PRN

## 2016-09-21 MED ORDER — MIDAZOLAM HCL 2 MG/2ML IJ SOLN
1.0000 mg | INTRAMUSCULAR | Status: DC | PRN
  Administered 2016-09-21 (×2): 2 mg via INTRAVENOUS

## 2016-09-21 MED ORDER — SODIUM CHLORIDE 0.9 % IV SOLN
INTRAVENOUS | Status: DC
  Administered 2016-09-21: 13:00:00 via INTRAVENOUS
  Administered 2016-09-21: 1000 mL via INTRAVENOUS

## 2016-09-21 MED ORDER — ACETAMINOPHEN 10 MG/ML IV SOLN
INTRAVENOUS | Status: DC | PRN
  Administered 2016-09-21: 1000 mg via INTRAVENOUS

## 2016-09-21 MED ORDER — SODIUM CHLORIDE 0.9 % IV SOLN: Freq: Once | INTRAVENOUS | Status: DC

## 2016-09-21 MED ORDER — INSULIN ASPART 100 UNIT/ML IV SOLN
10.0000 [IU] | Freq: Once | INTRAVENOUS | Status: AC
  Administered 2016-09-21: 10 [IU] via INTRAVENOUS
  Filled 2016-09-21: qty 0.1

## 2016-09-21 MED ORDER — FENTANYL CITRATE (PF) 100 MCG/2ML IJ SOLN
25.0000 ug | INTRAMUSCULAR | Status: DC | PRN
Start: 2016-09-21 — End: 2016-09-21

## 2016-09-21 MED ORDER — FENTANYL 2500MCG IN NS 250ML (10MCG/ML) PREMIX INFUSION
25.0000 ug/h | INTRAVENOUS | Status: DC
  Administered 2016-09-21: 150 ug/h via INTRAVENOUS
  Administered 2016-09-22 – 2016-09-23 (×3): 300 ug/h via INTRAVENOUS
  Administered 2016-09-23: 200 ug/h via INTRAVENOUS
  Administered 2016-09-24 (×2): 225 ug/h via INTRAVENOUS
  Administered 2016-09-25: 300 ug/h via INTRAVENOUS
  Administered 2016-09-25: 225 ug/h via INTRAVENOUS
  Administered 2016-09-26 – 2016-09-27 (×3): 200 ug/h via INTRAVENOUS
  Administered 2016-09-28: 100 ug/h via INTRAVENOUS
  Filled 2016-09-21 (×13): qty 250

## 2016-09-21 MED ORDER — SODIUM CHLORIDE 0.9 % IV SOLN
1.0000 g | Freq: Two times a day (BID) | INTRAVENOUS | Status: DC
  Filled 2016-09-21 (×3): qty 1

## 2016-09-21 MED ORDER — MIDAZOLAM HCL 2 MG/2ML IJ SOLN
1.0000 mg | INTRAMUSCULAR | Status: DC | PRN
  Administered 2016-09-25 – 2016-09-27 (×3): 1 mg via INTRAVENOUS
  Filled 2016-09-21 (×2): qty 2

## 2016-09-21 MED ORDER — FAMOTIDINE IN NACL 20-0.9 MG/50ML-% IV SOLN
20.0000 mg | Freq: Two times a day (BID) | INTRAVENOUS | Status: DC
  Administered 2016-09-22 – 2016-09-30 (×16): 20 mg via INTRAVENOUS
  Filled 2016-09-21 (×17): qty 50

## 2016-09-21 MED ORDER — SUCCINYLCHOLINE CHLORIDE 20 MG/ML IJ SOLN
INTRAMUSCULAR | Status: DC | PRN
  Administered 2016-09-21: 100 mg via INTRAVENOUS

## 2016-09-21 MED ORDER — PHENYLEPHRINE HCL 10 MG/ML IJ SOLN
INTRAMUSCULAR | Status: DC | PRN
  Administered 2016-09-21 (×3): 100 ug via INTRAVENOUS
  Administered 2016-09-21 (×2): 200 ug via INTRAVENOUS

## 2016-09-21 MED ORDER — LACTATED RINGERS IV SOLN
INTRAVENOUS | Status: DC
  Administered 2016-09-21 (×3): via INTRAVENOUS

## 2016-09-21 MED ORDER — ROCURONIUM BROMIDE 100 MG/10ML IV SOLN
INTRAVENOUS | Status: DC | PRN
  Administered 2016-09-21: 20 mg via INTRAVENOUS
  Administered 2016-09-21: 10 mg via INTRAVENOUS
  Administered 2016-09-21: 40 mg via INTRAVENOUS
  Administered 2016-09-21: 20 mg via INTRAVENOUS
  Administered 2016-09-21: 10 mg via INTRAVENOUS

## 2016-09-21 MED ORDER — SODIUM CHLORIDE 0.9 % IV BOLUS (SEPSIS)
1000.0000 mL | INTRAVENOUS | Status: AC
  Administered 2016-09-21: 1000 mL via INTRAVENOUS

## 2016-09-21 MED ORDER — FENTANYL CITRATE (PF) 100 MCG/2ML IJ SOLN: 50.0000 ug | Freq: Once | INTRAMUSCULAR | Status: DC

## 2016-09-21 MED ORDER — MIDAZOLAM HCL 2 MG/2ML IJ SOLN
1.0000 mg | INTRAMUSCULAR | Status: AC | PRN
  Administered 2016-09-21: 2 mg via INTRAVENOUS
  Filled 2016-09-21: qty 2

## 2016-09-21 MED ORDER — IOPAMIDOL (ISOVUE-300) INJECTION 61%
75.0000 mL | Freq: Once | INTRAVENOUS | Status: AC | PRN
  Administered 2016-09-21: 75 mL via INTRAVENOUS

## 2016-09-21 MED ORDER — ACETAMINOPHEN 10 MG/ML IV SOLN
INTRAVENOUS | Status: AC
  Filled 2016-09-21: qty 100

## 2016-09-21 MED ORDER — DEXTROSE 50 % IV SOLN
1.0000 | Freq: Once | INTRAVENOUS | Status: AC
  Administered 2016-09-21: 50 mL via INTRAVENOUS
  Filled 2016-09-21: qty 50

## 2016-09-21 MED ORDER — SODIUM CHLORIDE 0.9 % IV SOLN
Freq: Once | INTRAVENOUS | Status: AC
  Administered 2016-09-21: 500 mL via INTRAVENOUS

## 2016-09-21 MED ORDER — SODIUM CHLORIDE 0.9 % IV SOLN: INTRAVENOUS | Status: DC

## 2016-09-21 MED ORDER — METRONIDAZOLE IN NACL 5-0.79 MG/ML-% IV SOLN
500.0000 mg | Freq: Three times a day (TID) | INTRAVENOUS | Status: DC
  Filled 2016-09-21 (×2): qty 100

## 2016-09-21 MED ORDER — FENTANYL BOLUS VIA INFUSION
25.0000 ug | INTRAVENOUS | Status: DC | PRN
  Administered 2016-09-27: 25 ug via INTRAVENOUS
  Filled 2016-09-21: qty 25

## 2016-09-21 MED ORDER — MIDAZOLAM HCL 2 MG/2ML IJ SOLN
INTRAMUSCULAR | Status: DC | PRN
  Administered 2016-09-21: 2 mg via INTRAVENOUS

## 2016-09-21 MED ORDER — MIDAZOLAM HCL 5 MG/5ML IJ SOLN
INTRAMUSCULAR | Status: AC
  Administered 2016-09-21: 2 mg via INTRAVENOUS
  Filled 2016-09-21: qty 5

## 2016-09-21 MED ORDER — MIDAZOLAM HCL 2 MG/2ML IJ SOLN
INTRAMUSCULAR | Status: AC
  Filled 2016-09-21: qty 2

## 2016-09-21 MED ORDER — PHENYLEPHRINE HCL 10 MG/ML IJ SOLN: 10.0000 ug/min | INTRAVENOUS | Status: DC

## 2016-09-21 MED ORDER — IPRATROPIUM-ALBUTEROL 0.5-2.5 (3) MG/3ML IN SOLN: 3.0000 mL | RESPIRATORY_TRACT | Status: DC

## 2016-09-21 MED ORDER — MIDAZOLAM HCL 2 MG/2ML IJ SOLN
1.0000 mg | INTRAMUSCULAR | Status: DC | PRN
  Administered 2016-09-25 – 2016-09-27 (×2): 1 mg via INTRAVENOUS
  Filled 2016-09-21 (×3): qty 2

## 2016-09-21 MED ORDER — PROPOFOL 10 MG/ML IV BOLUS
INTRAVENOUS | Status: DC | PRN
  Administered 2016-09-21: 100 mg via INTRAVENOUS

## 2016-09-21 MED ORDER — SODIUM CHLORIDE 0.9 % IV SOLN
INTRAVENOUS | Status: DC
  Administered 2016-09-21: 22:00:00 via INTRAVENOUS

## 2016-09-21 MED ORDER — FENTANYL CITRATE (PF) 100 MCG/2ML IJ SOLN
INTRAMUSCULAR | Status: DC | PRN
  Administered 2016-09-21 (×5): 50 ug via INTRAVENOUS

## 2016-09-21 MED ORDER — LIDOCAINE HCL (CARDIAC) 20 MG/ML IV SOLN
INTRAVENOUS | Status: DC | PRN
  Administered 2016-09-21: 100 mg via INTRAVENOUS

## 2016-09-21 MED ORDER — METRONIDAZOLE 500 MG PO TABS: 500.0000 mg | ORAL_TABLET | Freq: Three times a day (TID) | ORAL | Status: DC

## 2016-09-21 SURGICAL SUPPLY — 40 items
CANISTER SUCT 1200ML W/VALVE (MISCELLANEOUS) ×4 IMPLANT
CATH TRAY 16F METER LATEX (MISCELLANEOUS) ×4 IMPLANT
CHLORAPREP W/TINT 26ML (MISCELLANEOUS) ×4 IMPLANT
CLOSURE WOUND 1/2 X4 (GAUZE/BANDAGES/DRESSINGS) ×2
DRAIN PENROSE 5/8X18 LTX STRL (WOUND CARE) ×2 IMPLANT
DRAPE INCISE IOBAN 66X45 STRL (DRAPES) ×4 IMPLANT
DRAPE LAPAROTOMY 100X77 ABD (DRAPES) ×4 IMPLANT
ELECT BLADE 6.5 EXT (BLADE) ×2 IMPLANT
ELECT CAUTERY NEEDLE TIP 1.0 (MISCELLANEOUS) ×4
ELECT REM PT RETURN 9FT ADLT (ELECTROSURGICAL) ×4
ELECTRODE CAUTERY NEDL TIP 1.0 (MISCELLANEOUS) ×2 IMPLANT
ELECTRODE REM PT RTRN 9FT ADLT (ELECTROSURGICAL) ×2 IMPLANT
GAUZE SPONGE 4X4 12PLY STRL (GAUZE/BANDAGES/DRESSINGS) ×4 IMPLANT
GLOVE BIO SURGEON STRL SZ7.5 (GLOVE) ×8 IMPLANT
GLOVE INDICATOR 8.0 STRL GRN (GLOVE) ×8 IMPLANT
GOWN STRL REUS W/ TWL LRG LVL3 (GOWN DISPOSABLE) ×8 IMPLANT
GOWN STRL REUS W/TWL LRG LVL3 (GOWN DISPOSABLE) ×16
KIT CATH CVC 3 LUMEN 7FR 8IN (MISCELLANEOUS) ×2 IMPLANT
KIT RM TURNOVER STRD PROC AR (KITS) ×4 IMPLANT
LABEL OR SOLS (LABEL) ×4 IMPLANT
LIGASURE MARYLAND LAP STAND (ELECTROSURGICAL) ×4 IMPLANT
NS IRRIG 1000ML POUR BTL (IV SOLUTION) ×4 IMPLANT
PACK BASIN MAJOR ARMC (MISCELLANEOUS) ×4 IMPLANT
PACK COLON CLEAN CLOSURE (MISCELLANEOUS) ×4 IMPLANT
STAPLER CUT CVD 40MM GREEN (STAPLE) ×2 IMPLANT
STAPLER CUT RELOAD GREEN (STAPLE) ×4 IMPLANT
STAPLER SKIN PROX 35W (STAPLE) ×4 IMPLANT
STRIP CLOSURE SKIN 1/2X4 (GAUZE/BANDAGES/DRESSINGS) ×6 IMPLANT
SUT MAXON ABS #0 GS21 30IN (SUTURE) ×4 IMPLANT
SUT PDS AB 1 TP1 96 (SUTURE) ×6 IMPLANT
SUT SILK 3-0 (SUTURE) ×4
SUT SILK 3-0 SH-1 18XCR BRD (SUTURE) ×2
SUT VIC AB 2-0 BRD 54 (SUTURE) ×2 IMPLANT
SUT VIC AB 2-0 CT2 27 (SUTURE) ×2 IMPLANT
SUT VIC AB 2-0 SH 27 (SUTURE) ×4
SUT VIC AB 2-0 SH 27XBRD (SUTURE) IMPLANT
SUT VIC AB 3-0 SH 27 (SUTURE)
SUT VIC AB 3-0 SH 27X BRD (SUTURE) ×2 IMPLANT
SUT VICRYL+ 3-0 144IN (SUTURE) ×2 IMPLANT
SUTURE SILK 3-0 SH-1 18XCR BRD (SUTURE) ×2 IMPLANT

## 2016-09-21 NOTE — OR Nursing (Signed)
Saline lock present in left hand 22 gauge upon arrival to preop

## 2016-09-21 NOTE — OR Nursing (Signed)
Respiratory at  Bedside ABG drawn after tube was pulled back by respiratory

## 2016-09-21 NOTE — Progress Notes (Signed)
09/19/2016 - 09/21/2016  5:32 PM  PATIENT:  Ruth Gray  70 y.o. female  PRE-OPERATIVE DIAGNOSIS:  perforated bowel  POST-OPERATIVE DIAGNOSIS:  perforated bowel  PROCEDURE:  Procedure(s): EXPLORATORY LAPAROTOMY (N/A) COLON RESECTION- Ascending and Sigmoid INSERTION CENTRAL LINE ADULT  SURGEON:  Surgeon(s) and Role:    * Salley Hews, MD - Primary   ASSISTANTS: [place]   ANESTHESIA:   general  EBL:  Total I/O In: 1500 [I.V.:1500] Out: 850 [Urine:350; Blood:500]   DRAINS: Penrose drain in the wound   LOCAL MEDICATIONS USED:  NONE   DISPOSITION OF SPECIMEN:  PATHOLOGY   DICTATION: .Dragon Dictation with the patient in supine position and after induction of appropriate general anesthesia the patient was prepped with Betadine and draped with sterile towels. An alcohol wipe and Betadine impregnated Steri-Drape are utilized. Midline incision was made from just above the umbilicus to the suprapubic area and carried down to the septae stitch Bovie cautery. Midline fascia identified no platelets skin incision as well as peritoneum. The abdomen decompressed rapidly of a massive amount of free air. The abdomen was carefully examined and was a large amount of bilious material. I first examined the sigmoid colon where this problem had a originated on CT scan. The sigmoid colon was densely adherent to the pelvic sidewall scar down sclerotic and nearly obstructive. However there does not appear to be any evidence for perforation. Further exploration identified a huge rent in the ascending colon which appeared to be ischemic. Free stool was copiously collecting in that area. The bowel was quickly oversewn in that area with 3-0 Vicryl to avoid any further contamination in the abdomen irrigated. Handfuls of stool were removed from the abdominal cavity. It was elected to proceed with a damage control operation. The right colon appeared to be dusky and ischemic significantly distended. It was  mobilized from the lateral lateral peritoneal attachments all the way the mid transverse colon. The LigaSure was used for the division along with blunt and Bovie dissection. The bowel was divided in the transverse colon and in the terminal ileum using the contour stapling device carrying green load. Both ends of the Balfour marked but no attempt was made to create an anastomosis. The mesentery was divided between clamps and tied with 2-0 Vicryl. Stick tie was utilized on the ileocolic artery. The bowel was passed off the table.  Attention was then turned to the sigmoid colon which was original problem. I felt that we should not leave that up sclerotic area in the abdomen particularly since we did not know if this involved a malignancy. The distal sigmoid was soft and was then divided using a contour stapling apparatus. The distal descending colon was divided in similar fashion and the bowel manipulated off the pelvic sidewall with combination of blunt and LigaSure dissection. Both ureters were identified and preserved. It is difficult to describe how intensely sclerotic reaction was. No mass was identified at surgery were in opening the bowel following the procedure. The liver did appear to be intact. Again no attempt was made to reanastomose the bowel. Both ends were left in discontinuity. The abdomen was then cut was irrigated with liters of warm saline solution. Small mesenteric rent was closed with 3-0 Vicryl. Nasogastric tube was appropriately positioned. Bowel contents returned to their anatomic position and the abdominal wall closed with a running suture of #1 looped PDS tied in the middle and not buried. Penrose drain was placed the skin loosely closed over the drain. It is  our intention to return to surgery likely and 20 for 48 hours for reexploration attempt at more definitive control if the patient survives.  A right internal jugular line was placed using ultrasound guidance. The patient returned to  the recovery room in critical condition intubated having tolerated procedure as well as possible. Total blood loss was approximately 500 cc.  PLAN OF CARE: Admit to inpatient   PATIENT DISPOSITION:  ICU - intubated and critically ill.   Tiney Rouge III, MD

## 2016-09-21 NOTE — Anesthesia Preprocedure Evaluation (Signed)
Anesthesia Evaluation  Patient identified by MRN, date of birth, ID band Patient awake    Reviewed: Allergy & Precautions, NPO status , Patient's Chart, lab work & pertinent test results  History of Anesthesia Complications Negative for: history of anesthetic complications  Airway Mallampati: II  TM Distance: >3 FB Neck ROM: Full    Dental  (+) Poor Dentition, Missing, Partial Lower   Pulmonary neg sleep apnea, neg COPD, Current Smoker,    breath sounds clear to auscultation- rhonchi (-) wheezing      Cardiovascular Exercise Tolerance: Good (-) hypertension(-) CAD and (-) Past MI  Rhythm:Regular Rate:Normal - Systolic murmurs and - Diastolic murmurs    Neuro/Psych negative neurological ROS  negative psych ROS   GI/Hepatic negative GI ROS, Neg liver ROS,   Endo/Other  negative endocrine ROSneg diabetes  Renal/GU negative Renal ROS     Musculoskeletal  (+) Arthritis , Rheumatoid disorders,    Abdominal (+) + obese,   Peds  Hematology negative hematology ROS (+)   Anesthesia Other Findings Past Medical History: No date: Collagen vascular disease (HCC) 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   Reproductive/Obstetrics                             Anesthesia Physical Anesthesia Plan  ASA: III  Anesthesia Plan: General   Post-op Pain Management:    Induction: Intravenous, Rapid sequence and Cricoid pressure planned  Airway Management Planned: Oral ETT  Additional Equipment:   Intra-op Plan:   Post-operative Plan: Extubation in OR and Possible Post-op intubation/ventilation  Informed Consent: I have reviewed the patients History and Physical, chart, labs and discussed the procedure including the risks, benefits and alternatives for the proposed anesthesia with the patient or authorized representative who has indicated his/her understanding and acceptance.   Dental advisory given  Plan  Discussed with: CRNA and Anesthesiologist  Anesthesia Plan Comments:         Anesthesia Quick Evaluation

## 2016-09-21 NOTE — Anesthesia Procedure Notes (Signed)
Procedure Name: Intubation Date/Time: 09/21/2016 3:44 PM Performed by: Junious Silk Pre-anesthesia Checklist: Patient identified, Patient being monitored, Timeout performed, Emergency Drugs available and Suction available Patient Re-evaluated:Patient Re-evaluated prior to inductionOxygen Delivery Method: Circle system utilized Preoxygenation: Pre-oxygenation with 100% oxygen Intubation Type: IV induction Ventilation: Mask ventilation without difficulty Laryngoscope Size: Mac and 3 Grade View: Grade I Tube type: Oral Tube size: 7.0 mm Number of attempts: 1 Airway Equipment and Method: Stylet Placement Confirmation: ETT inserted through vocal cords under direct vision,  positive ETCO2 and breath sounds checked- equal and bilateral Secured at: 21 cm Tube secured with: Tape Dental Injury: Teeth and Oropharynx as per pre-operative assessment

## 2016-09-21 NOTE — OR Nursing (Signed)
Et tube after x-ray need to pull back 3 cm  tube at 22 at lip

## 2016-09-21 NOTE — Progress Notes (Signed)
Subjective:   Call to see patient regarding increased abdominal pain and shortness of breath this morning. The patient relates that she's having increasing abdominal pain PICC line suprapubic left lower quadrant area associated with mild nausea. She is afraid to eat or drink anything. She did have dry heaves fairly significantly yesterday. She's had more abdominal pain but it has been well controlled with pain medicine. Her blood pressure remains in the high 90s to low 106 and she's mildly tachycardic. She's had minimal urine output overnight. Her creatinine has increased. Her white blood cell count is not available this morning.  Vital signs in last 24 hours: Temp:  [97.9 F (36.6 C)-99.1 F (37.3 C)] 97.9 F (36.6 C) (10/31 0830) Pulse Rate:  [91-145] 124 (10/31 0830) Resp:  [20-22] 20 (10/31 0830) BP: (92-125)/(58-77) 92/67 (10/31 0830) SpO2:  [84 %-95 %] 94 % (10/31 0830) Weight:  [88.4 kg (194 lb 14.4 oz)] 88.4 kg (194 lb 14.4 oz) (10/31 4742) Last BM Date: 09/15/16  Intake/Output from previous day: 10/30 0701 - 10/31 0700 In: 3641 [P.O.:750; I.V.:2741; IV Piggyback:150] Out: 200 [Urine:200]  Exam:  Her exam is really unchanged. She has no rebound but mild guarding left lower quadrant tenderness and mild abdominal distention. She has hypoactive but present bowel sounds.  Lab Results:  CBC  Recent Labs  09/19/16 1028 09/20/16 0508  WBC 9.1 6.4  HGB 13.7 11.8*  HCT 40.2 34.9*  PLT 259 229   CMP     Component Value Date/Time   NA 134 (L) 09/21/2016 0517   K 4.6 09/21/2016 0517   CL 108 09/21/2016 0517   CO2 19 (L) 09/21/2016 0517   GLUCOSE 116 (H) 09/21/2016 0517   BUN 21 (H) 09/21/2016 0517   CREATININE 1.31 (H) 09/21/2016 0517   CALCIUM 7.9 (L) 09/21/2016 0517   PROT 6.4 (L) 09/20/2016 0508   ALBUMIN 3.0 (L) 09/20/2016 0508   AST 25 09/20/2016 0508   ALT 18 09/20/2016 0508   ALKPHOS 67 09/20/2016 0508   BILITOT 0.7 09/20/2016 0508   GFRNONAA 41 (L) 09/21/2016  0517   GFRAA 47 (L) 09/21/2016 0517   PT/INR No results for input(s): LABPROT, INR in the last 72 hours.  Studies/Results: Dg Abd 1 View  Result Date: 09/20/2016 CLINICAL DATA:  70 year old female with abdominal pain, nausea and vomiting EXAM: ABDOMEN - 1 VIEW COMPARISON:  CT abdomen/ pelvis 09/19/2016 FINDINGS: Persistent marked cecal distention. There is gentle tapering of the gas-filled sigmoid colon at the region of recently identified inflammatory thickening. No evidence of free air scratch then no evidence of large free air on this supine series. The lung bases are clear. No acute osseous abnormality. Mild rotary dextro convex scoliosis with multilevel lumbar degenerative disc disease. IMPRESSION: 1. Persisting gaseous distension of the cecum likely secondary to ileus. 2. Tapering of the gas column in the sigmoid colon in the region of recently reported inflammatory wall thickening. Electronically Signed   By: Malachy Moan M.D.   On: 09/20/2016 08:37   Ct Abdomen Pelvis W Contrast  Result Date: 09/19/2016 CLINICAL DATA:  Acute generalized abdominal pain. EXAM: CT ABDOMEN AND PELVIS WITH CONTRAST TECHNIQUE: Multidetector CT imaging of the abdomen and pelvis was performed using the standard protocol following bolus administration of intravenous contrast. CONTRAST:  ISOVUE-300 IOPAMIDOL (ISOVUE-300) INJECTION 61% COMPARISON:  None available currently. FINDINGS: Lower chest: Mild bilateral posterior basilar subsegmental atelectasis is noted. Hepatobiliary: No gallstones are noted. Small cyst is seen in right hepatic lobe. No  other abnormality seen in the liver. Pancreas: Normal. Spleen: Normal. Adrenals/Urinary Tract: Adrenal glands and kidneys appear normal. No hydronephrosis or renal obstruction is noted. Urinary bladder appears normal. Stomach/Bowel: No small bowel dilatation is noted. Wall thickening of sigmoid colon is noted with surrounding inflammation suggesting colitis or  diverticulitis. The appendix appears normal. Diverticulosis of descending and sigmoid colon is noted. The more proximal colon is dilated and air-filled. Vascular/Lymphatic: Atherosclerosis of abdominal aorta is noted without aneurysm formation. No significant adenopathy is noted. Reproductive: Status post hysterectomy. Other: No abnormal fluid collection is noted. Musculoskeletal: Severe multilevel degenerative disc disease is noted in the visualized portions of the thoracic and lumbar spine. IMPRESSION: Aortic atherosclerosis. Diverticulosis of descending and proximal sigmoid colon is noted. Diffuse wall thickening of sigmoid colon is noted with surrounding inflammation most consistent with diverticulitis or colitis. The more proximal colon is dilated and air-filled suggesting ileus or possibly distal colonic obstruction. Electronically Signed   By: Lupita Raider, M.D.   On: 09/19/2016 14:14    Assessment/Plan: I reviewed her CT scan from the 29th. The working diagnosis was diverticulitis but she really does not have a particularly ugly looking colon. There is no sign of perforation at that time. She has some significant colonic distention likely related to a functional obstruction distally in the sigmoid colon. GI has seen her and elected not to consider endoscopy at the present time. I am not entirely convinced that working diagnosis of diverticulitis as correct.  I do not see any clinical indications for surgical intervention. I would repeat her CT scan to see if there is evidence perforation the present time. She's approximately 3 L ahead in fluids and I wonder if some or shortness of breath could be related to other possible sources. I will get a chest x-ray and workup other options at the present time. Nasogastric tube may be of some benefit to help with her decompression. However, unless the CT scan demonstrates evidence of perforation or significant change in the picture of her abdomen I do not see  any surgical indications at the present time. We will continue to follow her.

## 2016-09-21 NOTE — Progress Notes (Signed)
[facility] - [place] [facility]   PATIENT NAME: Ruth Gray    MR#:  [id number]  DATE OF BIRTH:  18-Jan-1946  SUBJECTIVE:   Patient Having increased abdominal pain and shortness of breath this morning. She is having some nausea as well. Her blood pressure was low this morning. She has received multiple doses of pain medications in the night due to abdominal pain  REVIEW OF SYSTEMS:    Review of Systems  Constitutional: Positive for malaise/fatigue. Negative for chills and fever.  HENT: Negative.  Negative for ear discharge, ear pain, hearing loss, nosebleeds and sore throat.   Eyes: Negative.  Negative for blurred vision and pain.  Respiratory: Negative.  Negative for cough, hemoptysis, shortness of breath and wheezing.   Cardiovascular: Negative.  Negative for chest pain, palpitations and leg swelling.  Gastrointestinal: Positive for abdominal pain, nausea and vomiting. Negative for blood in stool and diarrhea.  Genitourinary: Negative.  Negative for dysuria.  Musculoskeletal: Negative.  Negative for back pain.  Skin: Negative.   Neurological: Positive for weakness. Negative for dizziness, tremors, speech change, focal weakness, seizures and headaches.  Endo/Heme/Allergies: Negative.  Does not bruise/bleed easily.  Psychiatric/Behavioral: Negative.  Negative for depression, hallucinations and suicidal ideas.    Tolerating Diet: npo      DRUG ALLERGIES:   Allergies  Allergen Reactions  . Ciprofloxacin Swelling  . Penicillins Swelling and Rash    Has patient had a PCN reaction causing immediate rash, facial/tongue/throat swelling, SOB or lightheadedness with hypotension: {no Has patient had a PCN reaction causing severe rash involving mucus membranes or skin necrosis: no Has patient had a PCN reaction that required hospitalization no Has patient had a PCN reaction occurring within the last 10 years: no If all of the above answers are "NO", then may  proceed with Cephalosporin use.     VITALS:  Blood pressure 103/69, pulse (!) 119, temperature 97.9 F (36.6 C), temperature source Oral, resp. rate 20, height 5\' 7"  (1.702 m), weight 88.4 kg (194 lb 14.4 oz), SpO2 94 %.  PHYSICAL EXAMINATION:   Physical Exam  Constitutional: She is oriented to person, place, and time and well-developed, well-nourished, and in no distress. No distress.  HENT:  Head: Normocephalic.  Eyes: No scleral icterus.  Neck: Normal range of motion. Neck supple. No JVD present. No tracheal deviation present.  Cardiovascular: Normal rate and regular rhythm.  Exam reveals no gallop and no friction rub.   Murmur heard. Pulmonary/Chest: Effort normal and breath sounds normal. No respiratory distress. She has no wheezes. She has no rales. She exhibits no tenderness.  Abdominal: Soft. She exhibits distension. She exhibits no mass. There is no tenderness. There is no rebound and no guarding.  Hypoactive bowel sounds  Musculoskeletal: Normal range of motion. She exhibits no edema.  Neurological: She is alert and oriented to person, place, and time.  Skin: Skin is warm. No rash noted. No erythema.  Psychiatric: Affect and judgment normal.      LABORATORY PANEL:   CBC  Recent Labs Lab 09/20/16 0508  WBC 6.4  HGB 11.8*  HCT 34.9*  PLT 229   ------------------------------------------------------------------------------------------------------------------  Chemistries   Recent Labs Lab 09/20/16 0508 09/21/16 0517  NA 136 134*  K 3.3* 4.6  CL 109 108  CO2 20* 19*  GLUCOSE 97 116*  BUN 14 21*  CREATININE 0.83 1.31*  CALCIUM 8.0* 7.9*  AST 25  --   ALT 18  --   ALKPHOS 67  --  BILITOT 0.7  --    ------------------------------------------------------------------------------------------------------------------  Cardiac Enzymes No results for input(s): TROPONINI in the last 168  hours. ------------------------------------------------------------------------------------------------------------------  RADIOLOGY:  Dg Abd 1 View  Result Date: 09/20/2016 CLINICAL DATA:  70 year old female with abdominal pain, nausea and vomiting EXAM: ABDOMEN - 1 VIEW COMPARISON:  CT abdomen/ pelvis 09/19/2016 FINDINGS: Persistent marked cecal distention. There is gentle tapering of the gas-filled sigmoid colon at the region of recently identified inflammatory thickening. No evidence of free air scratch then no evidence of large free air on this supine series. The lung bases are clear. No acute osseous abnormality. Mild rotary dextro convex scoliosis with multilevel lumbar degenerative disc disease. IMPRESSION: 1. Persisting gaseous distension of the cecum likely secondary to ileus. 2. Tapering of the gas column in the sigmoid colon in the region of recently reported inflammatory wall thickening. Electronically Signed   By: Malachy Moan M.D.   On: 09/20/2016 08:37   Ct Abdomen Pelvis W Contrast  Result Date: 09/19/2016 CLINICAL DATA:  Acute generalized abdominal pain. EXAM: CT ABDOMEN AND PELVIS WITH CONTRAST TECHNIQUE: Multidetector CT imaging of the abdomen and pelvis was performed using the standard protocol following bolus administration of intravenous contrast. CONTRAST:  ISOVUE-300 IOPAMIDOL (ISOVUE-300) INJECTION 61% COMPARISON:  None available currently. FINDINGS: Lower chest: Mild bilateral posterior basilar subsegmental atelectasis is noted. Hepatobiliary: No gallstones are noted. Small cyst is seen in right hepatic lobe. No other abnormality seen in the liver. Pancreas: Normal. Spleen: Normal. Adrenals/Urinary Tract: Adrenal glands and kidneys appear normal. No hydronephrosis or renal obstruction is noted. Urinary bladder appears normal. Stomach/Bowel: No small bowel dilatation is noted. Wall thickening of sigmoid colon is noted with surrounding inflammation suggesting colitis  or diverticulitis. The appendix appears normal. Diverticulosis of descending and sigmoid colon is noted. The more proximal colon is dilated and air-filled. Vascular/Lymphatic: Atherosclerosis of abdominal aorta is noted without aneurysm formation. No significant adenopathy is noted. Reproductive: Status post hysterectomy. Other: No abnormal fluid collection is noted. Musculoskeletal: Severe multilevel degenerative disc disease is noted in the visualized portions of the thoracic and lumbar spine. IMPRESSION: Aortic atherosclerosis. Diverticulosis of descending and proximal sigmoid colon is noted. Diffuse wall thickening of sigmoid colon is noted with surrounding inflammation most consistent with diverticulitis or colitis. The more proximal colon is dilated and air-filled suggesting ileus or possibly distal colonic obstruction. Electronically Signed   By: Lupita Raider, M.D.   On: 09/19/2016 14:14     ASSESSMENT AND PLAN:    70 year old female with a history of rheumatoid arthritis on immunosuppressive medications who presents with abdominal pain and found to have diverticulitis and ileus.  1. Acute abdominal pain with CT findings suggestive of diverticulitis versus colitis and partial small bowel obstruction with decompensation this morning  Discussed with Dr. Michela Pitcher and will repeat CT scan this morning and chest x-ray  Continue IV antibiotics (aztreonam and Flagyl) and nothing by mouth status Consider NG tube depending on CT scan  2. Hypokalemia: Replete when necessary 3. History of rheumatoid arthritis: Medications on hold for now due to problem #1. 4. Acute kidney injury: This may be due to ATN. Hold nephrotoxic agents and repeat BMP in a.m.  Management plans discussed with the patient and she is in agreement.  CODE STATUS: full  TOTAL TIME TAKING CARE OF THIS PATIENT: 30 minutes.     POSSIBLE D/C ?? DEPENDING ON CLINICAL CONDITION.   Xaiver Roskelley M.D on 09/21/2016 at 11:02  AM  Between 7am to 6pm -  Pager - [phone] After 6pm go to [email or web address] - Social research officer, government  [facility]  Office  [phone]  CC: Primary care physician; FITZGERALD, DAVID Demetrius Charity, MD  Note: This dictation was prepared with Dragon dictation along with smaller phrase technology. Any transcriptional errors that result from this process are unintentional.

## 2016-09-21 NOTE — Progress Notes (Signed)
Follow-up CT scan demonstrates a massive amount of intra-abdominal free air consistent with a bowel perforation. Clinically she has deteriorated since yesterday. In this setting we will recommend urgent surgical intervention. I anticipate a possible colon resection with an end colostomy. This plans been discussed with the patient and she is in agreement. We plan urgent surgical intervention as soon as an operating room is available. I discussed the plan with the internal medicine physicians in addition.

## 2016-09-21 NOTE — Progress Notes (Signed)
Patient is alert and oriented. Report given to [facility] OR nurse. Questions answered. Notified MD of patient  unable to urinate since beginning of the shift  and some   respiratory wheezing. OR nurse aware and they will monitor patient and address the issue there.

## 2016-09-21 NOTE — Transfer of Care (Signed)
Immediate Anesthesia Transfer of Care Note  Patient: Ruth Gray  Procedure(s) Performed: Procedure(s): EXPLORATORY LAPAROTOMY (N/A) COLON RESECTION- Ascending and Sigmoid INSERTION CENTRAL LINE ADULT  Patient Location: PACU  Anesthesia Type:General  Level of Consciousness: sedated   Airway & Oxygen Therapy: Patient remains intubated per anesthesia plan and Patient placed on Ventilator (see vital sign flow sheet for setting)  Post-op Assessment: Report given to RN and Post -op Vital signs reviewed and stable  Post vital signs: Reviewed and stable  Last Vitals:  Vitals:   09/21/16 1425 09/21/16 1746  BP:    Pulse:  (!) (P) 121  Resp:    Temp: (!) 36 C (P) 36.1 C    Last Pain:  Vitals:   09/21/16 1746  TempSrc:   PainSc: (P) Asleep      Patients Stated Pain Goal: 0 (09/19/16 2100)  Complications: No apparent anesthesia complications

## 2016-09-21 NOTE — Progress Notes (Signed)
Report given to [facility] ICU nurse regarding patient and the Penicillin allergy.

## 2016-09-21 NOTE — Consult Note (Signed)
PULMONARY / CRITICAL CARE MEDICINE   Name: Ruth Gray MRN: [id number] DOB: [birth year]    ADMISSION DATE:  09/19/2016 CONSULTATION DATE: 09/21/16  REFERRING MD:  Dr. Rayna Sexton  CHIEF COMPLAINT: Post op Vent Management s/p Perforated bowel  HISTORY OF PRESENT ILLNESS:   Ruth Gray is a 70 yo female with past medical history significant for RA on immunosuppressive therapy. Patient presents on 10/29 to [facility] with persistent abdominal pain with intractable nausea and vomiting. CT of the abdomen was concerning for diverticulitis versus colitis.Marland Kitchen She was treated with oral antibiotics as an outpatient. She did not improve despite of antibiotic use. She was admitted with elevated WBC.  Patient was evaluated by gastroenterologist on 10/31 with increased abdominal pain and shortness of breath and the repeat CT  Scan demonstrated a massive amount of intra- abdominal free air consistent with a bowel perforation.  Patient underwent exploratory laprotomy s/p bowel perforation with intention to return to surgery likely 20-48 hours for re-exploration attempt.  Patient was sent to ICU intubated and mechanically ventillated with PCCM Consult on for vent management.  PAST MEDICAL HISTORY :  She  has a past medical history of Collagen vascular disease (HCC) and Rheumatoid arteritis.  PAST SURGICAL HISTORY: She  has a past surgical history that includes Replacement total knee (Left); Foot surgery; and Abdominal hysterectomy.  Allergies  Allergen Reactions  . Ciprofloxacin Swelling  . Tramadol Nausea And Vomiting  . Penicillins Swelling and Rash    Has patient had a PCN reaction causing immediate rash, facial/tongue/throat swelling, SOB or lightheadedness with hypotension: {no Has patient had a PCN reaction causing severe rash involving mucus membranes or skin necrosis: no Has patient had a PCN reaction that required hospitalization no Has patient had a PCN reaction occurring  within the last 10 years: no If all of the above answers are "NO", then may proceed with Cephalosporin use.     No current facility-administered medications on file prior to encounter.    Current Outpatient Prescriptions on File Prior to Encounter  Medication Sig  . Calcium Carb-Cholecalciferol (CALCIUM + D3 PO) Take by mouth.  . InFLIXimab (REMICADE IV) Inject into the vein once a week.  . Methotrexate, Anti-Rheumatic, (METHOTREXATE, PF, [place]) Inject into the skin.    FAMILY HISTORY:  Her indicated that her other is alive.    SOCIAL HISTORY: She  reports that she has been smoking Cigarettes.  She has been smoking about 0.50 packs per day. She has never used smokeless tobacco. She reports that she does not drink alcohol.  REVIEW OF SYSTEMS:   Unable to obtain  SUBJECTIVE:  Unable to obtain  VITAL SIGNS: BP 108/63 (BP Location: Right Arm)   Pulse (!) 121   Temp 97.3 F (36.3 C)   Resp (!) 21   Ht 5\' 7"  (1.702 m)   Wt 88 kg (194 lb)   SpO2 100%   BMI 30.38 kg/m   HEMODYNAMICS:    VENTILATOR SETTINGS: Vent Mode: PRVC FiO2 (%):  [55 %-80 %] 55 % Set Rate:  [15 bmp] 15 bmp Vt Set:  [450 mL] 450 mL PEEP:  [5 cmH20] 5 cmH20  INTAKE / OUTPUT: I/O last 3 completed shifts: In: 5141 [P.O.:750; I.V.:4241; IV Piggyback:150] Out: 1050 [Urine:550; Blood:500]  PHYSICAL EXAMINATION: General:  Sickly appearing white female, intubated and mechanically ventillated Neuro:  Opens eyes to spontaneously HEENT:  Atraumatic, normocephalic, no discharge, no JVD appreciated Cardiovascular:  S1S2, Tachycardic,no mrg noted Lungs:  Clear bilaterally,  no wheezes, crackles, rhonchi noted Abdomen:  Dressing on the abdomen, CDI Musculoskeletal:  Normal tone, no edema, cyanosis noted Skin:  Grossly intact  LABS:  BMET  Recent Labs Lab 09/20/16 0508 09/21/16 0517 09/21/16 1039 09/21/16 1435  NA 136 134* 135 137  K 3.3* 4.6 4.8 4.8  CL 109 108 107  --   CO2 20* 19* 20*  --   BUN  14 21* 23*  --   CREATININE 0.83 1.31* 1.75*  --   GLUCOSE 97 116* 112* 115*    Electrolytes  Recent Labs Lab 09/20/16 0508 09/21/16 0517 09/21/16 1039  CALCIUM 8.0* 7.9* 7.9*    CBC  Recent Labs Lab 09/19/16 1028 09/20/16 0508 09/21/16 1435  WBC 9.1 6.4  --   HGB 13.7 11.8* 11.9*  HCT 40.2 34.9* 35.0*  PLT 259 229  --     Coag's No results for input(s): APTT, INR in the last 168 hours.  Sepsis Markers No results for input(s): LATICACIDVEN, PROCALCITON, O2SATVEN in the last 168 hours.  ABG  Recent Labs Lab 09/21/16 1840  PHART 7.27*  PCO2ART 39  PO2ART 120*    Liver Enzymes  Recent Labs Lab 09/19/16 1028 09/20/16 0508  AST 31 25  ALT 19 18  ALKPHOS 83 67  BILITOT 0.7 0.7  ALBUMIN 3.7 3.0*    Cardiac Enzymes No results for input(s): TROPONINI, PROBNP in the last 168 hours.  Glucose  Recent Labs Lab 09/21/16 1350 09/21/16 1407  GLUCAP 199* 165*    Imaging Ct Abdomen Pelvis W Contrast  Result Date: 09/21/2016 CLINICAL DATA:  Followup diverticulitis. Worsening abdominal pain and ileus. EXAM: CT ABDOMEN AND PELVIS WITH CONTRAST TECHNIQUE: Multidetector CT imaging of the abdomen and pelvis was performed using the standard protocol following bolus administration of intravenous contrast. CONTRAST:  30mL ISOVUE-300 IOPAMIDOL (ISOVUE-300) INJECTION 61% COMPARISON:  09/19/2016 FINDINGS: Lower Chest: Increased bibasilar atelectasis. Hepatobiliary: No masses identified. Contrast seen within the gallbladder from recent CT. Pancreas:  No mass or inflammatory changes. Spleen: Within normal limits in size and appearance. Adrenals/Urinary Tract: No masses identified. No evidence of hydronephrosis. Stomach/Bowel: A large amount of free intraperitoneal air is new since previous study, consistent with bowel perforation. Moderate sigmoid colon wall thickening and pericolonic inflammatory changes show no significant change with associated scattered colonic  diverticula. This remains consistent with colitis or diverticulitis, and is likely the site of bowel perforation. Small amount of free fluid is seen in the pelvic cul-de-sac, however no abscess identified. No evidence of bowel obstruction. Normal appendix visualized. Vascular/Lymphatic: No pathologically enlarged lymph nodes. No abdominal aortic aneurysm. Aortic atherosclerosis. Reproductive: Previous hysterectomy. Adnexal regions are unremarkable. Small amount of free fluid seen in the pelvic cul-de-sac. Other:  None. Musculoskeletal:  No suspicious bone lesions identified. IMPRESSION: Large amount of free intraperitoneal air, consistent with bowel perforation. Small amount of free fluid in pelvic cul-de-sac, but no abscess identified. No significant change in moderate sigmoid colonic wall thickening and pericolonic inflammatory change, consistent with diverticulitis or colitis. Increased bibasilar atelectasis. Critical Value/emergent results were called by telephone at the time of interpretation on 09/21/2016 at 12:41 pm to Dr. Patricia Pesa MODY , who verbally acknowledged these results. Electronically Signed   By: Myles Rosenthal M.D.   On: 09/21/2016 12:41   Dg Chest Portable 1 View  Result Date: 09/21/2016 CLINICAL DATA:  Status post central line placement EXAM: PORTABLE CHEST 1 VIEW COMPARISON:  None. FINDINGS: Cardiac shadow is mildly enlarged in size. Increased density is noted in  the left lung base likely representing focal infiltrate and small effusion. Endotracheal tube is noted extending into the right mainstem bronchus which may account for some of the changes in the left base. Nasogastric catheter is noted within the stomach. Central line is seen at the cavoatrial junction. No pneumothorax is seen. IMPRESSION: Tubes and lines as described above. A portion of the changes in the left base may be related to the placement of the endotracheal tube is in the right mainstem bronchus. This should be withdrawn 3-4  cm. Changes in the left base which may be in part due to the endotracheal tube placement. These results were called by telephone at the time of interpretation on 09/21/2016 at 6:34 pm to [facility] the pts nurse in PACU, who verbally acknowledged these results. Electronically Signed   By: Alcide Clever M.D.   On: 09/21/2016 18:36     STUDIES:  10/29 CT abdomen>>Diverticulosis of descending and proximal sigmoid colon is noted.Diffuse wall thickening of sigmoid colon is noted with surroundinginflammation most consistent with diverticulitis or colitis.The more proximal colon is dilated and air-filled suggesting ileusor possibly distal colonic obstruction. 10/31 CT abdomen>>Large amount of free intraperitoneal air, consistent with bowelperforation. Small amount of free fluid in pelvic cul-de-sac, but noabscess identified.   CULTURES: None   ANTIBIOTICS: 10/31 Meropenem>>  SIGNIFICANT EVENTS: 10/31 patient admitted to [facility] ICU status post exploratory laparotomy due to bowel perforation  LINES/TUBES: 10/31 Right IJ>>  DISCUSSION: 70 YO Female with perforated bowel and diverticulitis now s/p exploratory laparotomy on 10/31 with plans of re-exploration attemt in 20-48 hours.  ASSESSMENT / PLAN:  PULMONARY A: Acute respiratory failure P:   Continue full vent support Fentanyl/ versed for sedation Established Vent setting Routine ABG  CARDIOVASCULAR A:  No active isssues P:  Continuous telemetry Keep MAP goals>65  RENAL A:   Acute kidney injury P:   Strict I/o Trend chemistry Replace electrolytes per ICU protocol  GASTROINTESTINAL A:   Exploratory laparotomy -10/31, s/p perforated bowel P:   Keep NPO Gastroenterologist following the patient Plans for re-exploration in 20-48 hours Famotidine for GIP Zofran for nausea Keep dressing/incision CDI  HEMATOLOGIC A:   No active issues P:  Heparin for DVT prophylaxis Transfuse if Hgb<7  INFECTIOUS A:   Exploratory  laparotomy s/p bowel perforation P:   Monitor fever curve Continue Meropenem for intraabdominal infections  ENDOCRINE A:   No active issues P:   Monitor BS intermittently with BMP  NEUROLOGIC A:   No active issues P:   RASS goal: 0    FAMILY  - Updates: No family present at the bedside.     Nixon Sparr,AG-ACNP Pulmonary and Critical Care Medicine [facility]   09/21/2016, 8:43 PM

## 2016-09-21 NOTE — OR Nursing (Signed)
X-ray taken for line placement

## 2016-09-21 NOTE — Progress Notes (Signed)
Subjective: More abdominal discomfort. CT as noted.   Objective: Vital signs in last 24 hours: Vitals:   09/21/16 0830 09/21/16 0955 09/21/16 1208 09/21/16 1239  BP: 92/67 103/69 108/75 119/74  Pulse: (!) 124 (!) 119 (!) 117 (!) 107  Resp: 20     Temp: 97.9 F (36.6 C)     TempSrc: Oral     SpO2: 94%     Weight:      Height:       Weight change: 14 lb 14.4 oz (6.759 kg)  Intake/Output Summary (Last 24 hours) at 09/21/16 1402 Last data filed at 09/21/16 0420  Gross per 24 hour  Intake             1188 ml  Output              200 ml  Net              988 ml     Exam: Heart:: tachycardia  Lungs    : clear to auscultation and percussion Abdomen:Decreased BS with diffuse tenderness   Lab Results: @LABTEST2 @ Micro Results: No results found for this or any previous visit (from the past 240 hour(s)). Studies/Results: Dg Abd 1 View  Result Date: 09/20/2016 CLINICAL DATA:  70 year old female with abdominal pain, nausea and vomiting EXAM: ABDOMEN - 1 VIEW COMPARISON:  CT abdomen/ pelvis 09/19/2016 FINDINGS: Persistent marked cecal distention. There is gentle tapering of the gas-filled sigmoid colon at the region of recently identified inflammatory thickening. No evidence of free air scratch then no evidence of large free air on this supine series. The lung bases are clear. No acute osseous abnormality. Mild rotary dextro convex scoliosis with multilevel lumbar degenerative disc disease. IMPRESSION: 1. Persisting gaseous distension of the cecum likely secondary to ileus. 2. Tapering of the gas column in the sigmoid colon in the region of recently reported inflammatory wall thickening. Electronically Signed   By: 09/21/2016 M.D.   On: 09/20/2016 08:37   Ct Abdomen Pelvis W Contrast  Result Date: 09/21/2016 CLINICAL DATA:  Followup diverticulitis. Worsening abdominal pain and ileus. EXAM: CT ABDOMEN AND PELVIS WITH CONTRAST TECHNIQUE: Multidetector CT imaging of the  abdomen and pelvis was performed using the standard protocol following bolus administration of intravenous contrast. CONTRAST:  74mL ISOVUE-300 IOPAMIDOL (ISOVUE-300) INJECTION 61% COMPARISON:  09/19/2016 FINDINGS: Lower Chest: Increased bibasilar atelectasis. Hepatobiliary: No masses identified. Contrast seen within the gallbladder from recent CT. Pancreas:  No mass or inflammatory changes. Spleen: Within normal limits in size and appearance. Adrenals/Urinary Tract: No masses identified. No evidence of hydronephrosis. Stomach/Bowel: A large amount of free intraperitoneal air is new since previous study, consistent with bowel perforation. Moderate sigmoid colon wall thickening and pericolonic inflammatory changes show no significant change with associated scattered colonic diverticula. This remains consistent with colitis or diverticulitis, and is likely the site of bowel perforation. Small amount of free fluid is seen in the pelvic cul-de-sac, however no abscess identified. No evidence of bowel obstruction. Normal appendix visualized. Vascular/Lymphatic: No pathologically enlarged lymph nodes. No abdominal aortic aneurysm. Aortic atherosclerosis. Reproductive: Previous hysterectomy. Adnexal regions are unremarkable. Small amount of free fluid seen in the pelvic cul-de-sac. Other:  None. Musculoskeletal:  No suspicious bone lesions identified. IMPRESSION: Large amount of free intraperitoneal air, consistent with bowel perforation. Small amount of free fluid in pelvic cul-de-sac, but no abscess identified. No significant change in moderate sigmoid colonic wall thickening and pericolonic inflammatory change, consistent with diverticulitis or colitis. Increased  bibasilar atelectasis. Critical Value/emergent results were called by telephone at the time of interpretation on 09/21/2016 at 12:41 pm to Dr. Patricia Pesa MODY , who verbally acknowledged these results. Electronically Signed   By: Myles Rosenthal M.D.   On: 09/21/2016  12:41   Medications: I have reviewed the patient's current medications. Scheduled Meds: . docusate sodium  100 mg Oral BID  . famotidine (PEPCID) IV  20 mg Intravenous Q12H  . heparin  5,000 Units Subcutaneous Q8H  . meropenem (MERREM) IV  1 g Intravenous Q12H  . polyethylene glycol  17 g Oral Daily   Continuous Infusions: . sodium chloride 100 mL/hr at 09/21/16 1314   PRN Meds:.acetaminophen **OR** acetaminophen, bisacodyl, HYDROmorphone (DILAUDID) injection, iopamidol, ondansetron **OR** ondansetron (ZOFRAN) IV   Assessment: Principal Problem:   Acute diverticulitis Active Problems:   Abdominal pain   Intractable nausea and vomiting   Rheumatoid arthritis (HCC)   Diverticulitis of large intestine without perforation or abscess without bleeding    Plan: Abdominal pain with bowel perforation Plan - Surgical  intervention   LOS: 2 days   Ula Lingo 09/21/2016, 2:02 PM

## 2016-09-22 ENCOUNTER — Encounter: Payer: Self-pay | Admitting: Surgery

## 2016-09-22 LAB — CBC
HEMATOCRIT: 34.7 % — AB (ref 35.0–47.0)
Hemoglobin: 11.7 g/dL — ABNORMAL LOW (ref 12.0–16.0)
MCH: 32.6 pg (ref 26.0–34.0)
MCHC: 33.8 g/dL (ref 32.0–36.0)
MCV: 96.5 fL (ref 80.0–100.0)
Platelets: 269 10*3/uL (ref 150–440)
RBC: 3.6 MIL/uL — ABNORMAL LOW (ref 3.80–5.20)
RDW: 15.4 % — AB (ref 11.5–14.5)
WBC: 12.5 10*3/uL — ABNORMAL HIGH (ref 3.6–11.0)

## 2016-09-22 LAB — BASIC METABOLIC PANEL
Anion gap: 4 — ABNORMAL LOW (ref 5–15)
BUN: 29 mg/dL — AB (ref 6–20)
CALCIUM: 7.1 mg/dL — AB (ref 8.9–10.3)
CO2: 20 mmol/L — AB (ref 22–32)
CREATININE: 1.19 mg/dL — AB (ref 0.44–1.00)
Chloride: 111 mmol/L (ref 101–111)
GFR calc Af Amer: 53 mL/min — ABNORMAL LOW (ref >60)
GFR calc non Af Amer: 45 mL/min — ABNORMAL LOW (ref >60)
GLUCOSE: 62 mg/dL — AB (ref 65–99)
Potassium: 5.7 mmol/L — ABNORMAL HIGH (ref 3.5–5.1)
Sodium: 135 mmol/L (ref 135–145)

## 2016-09-22 LAB — GLUCOSE, CAPILLARY
GLUCOSE-CAPILLARY: 144 mg/dL — AB (ref 65–99)
GLUCOSE-CAPILLARY: 102 mg/dL — AB (ref 65–99)
Glucose-Capillary: 59 mg/dL — ABNORMAL LOW (ref 65–99)
Glucose-Capillary: 119 mg/dL — ABNORMAL HIGH (ref 65–99)
Glucose-Capillary: 121 mg/dL — ABNORMAL HIGH (ref 65–99)

## 2016-09-22 LAB — BLOOD GAS, ARTERIAL
Acid-base deficit: 8.7 mmol/L — ABNORMAL HIGH (ref 0.0–2.0)
Bicarbonate: 18.8 mmol/L — ABNORMAL LOW (ref 20.0–28.0)
FIO2: 0.35
LHR: 15 {breaths}/min
O2 Saturation: 89.4 %
PEEP/CPAP: 5 cmH2O
PH ART: 7.22 — AB (ref 7.350–7.450)
Patient temperature: 37
VT: 450 mL
pCO2 arterial: 46 mmHg (ref 32.0–48.0)
pO2, Arterial: 69 mmHg — ABNORMAL LOW (ref 83.0–108.0)

## 2016-09-22 LAB — LACTIC ACID, PLASMA: Lactic Acid, Venous: 1.6 mmol/L (ref 0.5–1.9)

## 2016-09-22 LAB — POTASSIUM: Potassium: 5.7 mmol/L — ABNORMAL HIGH (ref 3.5–5.1)

## 2016-09-22 MED ORDER — SODIUM CHLORIDE 0.9 % IV BOLUS (SEPSIS)
250.0000 mL | Freq: Once | INTRAVENOUS | Status: AC
  Administered 2016-09-22: 250 mL via INTRAVENOUS

## 2016-09-22 MED ORDER — DEXTROSE-NACL 5-0.9 % IV SOLN
INTRAVENOUS | Status: DC
  Administered 2016-09-22: 11:00:00 via INTRAVENOUS

## 2016-09-22 MED ORDER — MEROPENEM-SODIUM CHLORIDE 1 GM/50ML IV SOLR
1.0000 g | Freq: Two times a day (BID) | INTRAVENOUS | Status: DC
  Administered 2016-09-22 – 2016-09-23 (×3): 1 g via INTRAVENOUS
  Filled 2016-09-22 (×5): qty 50

## 2016-09-22 MED ORDER — SODIUM BICARBONATE 8.4 % IV SOLN
INTRAVENOUS | Status: DC
  Administered 2016-09-22 – 2016-09-24 (×4): via INTRAVENOUS
  Filled 2016-09-22 (×7): qty 150

## 2016-09-22 MED ORDER — DEXTROSE 50 % IV SOLN
1.0000 | Freq: Once | INTRAVENOUS | Status: AC
  Administered 2016-09-22: 50 mL via INTRAVENOUS
  Filled 2016-09-22: qty 50

## 2016-09-22 MED ORDER — SODIUM CHLORIDE 0.9 % IV BOLUS (SEPSIS)
1000.0000 mL | Freq: Once | INTRAVENOUS | Status: AC
  Administered 2016-09-22: 1000 mL via INTRAVENOUS

## 2016-09-22 MED ORDER — INSULIN ASPART 100 UNIT/ML IV SOLN
10.0000 [IU] | Freq: Once | INTRAVENOUS | Status: AC
  Administered 2016-09-22: 10 [IU] via INTRAVENOUS
  Filled 2016-09-22: qty 0.1

## 2016-09-22 MED ORDER — ACETAMINOPHEN 325 MG PO TABS
650.0000 mg | ORAL_TABLET | ORAL | Status: DC | PRN
  Administered 2016-09-26: 650 mg via ORAL

## 2016-09-22 MED ORDER — CHLORHEXIDINE GLUCONATE 0.12% ORAL RINSE (MEDLINE KIT)
15.0000 mL | Freq: Two times a day (BID) | OROMUCOSAL | Status: DC
  Administered 2016-09-22 – 2016-09-29 (×16): 15 mL via OROMUCOSAL

## 2016-09-22 MED ORDER — ORAL CARE MOUTH RINSE
15.0000 mL | OROMUCOSAL | Status: DC
  Administered 2016-09-22 – 2016-09-29 (×60): 15 mL via OROMUCOSAL

## 2016-09-22 MED ORDER — DEXTROSE 50 % IV SOLN
1.0000 | INTRAVENOUS | Status: AC
  Administered 2016-09-22: 50 mL via INTRAVENOUS
  Filled 2016-09-22: qty 50

## 2016-09-22 NOTE — Progress Notes (Signed)
Subjective:   She remains critically ill response To Commands Requiring Sedation for Her Intubation. Her Blood Pressure Is in the Mid 80s and Heart Rates in the Mid 120s to 140s without Any Pressor Support. She has slightly elevated white blood cell count of 12,000 and elevated potassium of 5.7. She slightly acidotic.  Vital signs in last 24 hours: Temp:  [97 F (36.1 C)-99.2 F (37.3 C)] 99.2 F (37.3 C) (11/01 0830) Pulse Rate:  [119-139] 136 (11/01 1345) Resp:  [10-23] 14 (11/01 1345) BP: (76-109)/(53-78) 82/55 (11/01 1345) SpO2:  [91 %-100 %] 95 % (11/01 1345) FiO2 (%):  [35 %-80 %] 35 % (11/01 1134) Last BM Date: 09/15/16  Intake/Output from previous day: 10/31 0701 - 11/01 0700 In: 2983 [I.V.:2983] Out: 980 [Urine:480; Blood:500]  Exam:  Her abdomen is soft with minimal drainage. She has no bowel sounds. She does not have any significant decrease in her lung sounds at the present time.  Lab Results:  CBC  Recent Labs  09/20/16 0508 09/21/16 1435 09/22/16 0406  WBC 6.4  --  12.5*  HGB 11.8* 11.9* 11.7*  HCT 34.9* 35.0* 34.7*  PLT 229  --  269   CMP     Component Value Date/Time   NA 135 09/22/2016 0406   K 5.7 (H) 09/22/2016 1103   CL 111 09/22/2016 0406   CO2 20 (L) 09/22/2016 0406   GLUCOSE 62 (L) 09/22/2016 0406   BUN 29 (H) 09/22/2016 0406   CREATININE 1.19 (H) 09/22/2016 0406   CALCIUM 7.1 (L) 09/22/2016 0406   PROT 6.4 (L) 09/20/2016 0508   ALBUMIN 3.0 (L) 09/20/2016 0508   AST 25 09/20/2016 0508   ALT 18 09/20/2016 0508   ALKPHOS 67 09/20/2016 0508   BILITOT 0.7 09/20/2016 0508   GFRNONAA 45 (L) 09/22/2016 0406   GFRAA 53 (L) 09/22/2016 0406   PT/INR No results for input(s): LABPROT, INR in the last 72 hours.  Studies/Results: Ct Abdomen Pelvis W Contrast  Result Date: 09/21/2016 CLINICAL DATA:  Followup diverticulitis. Worsening abdominal pain and ileus. EXAM: CT ABDOMEN AND PELVIS WITH CONTRAST TECHNIQUE: Multidetector CT imaging of the  abdomen and pelvis was performed using the standard protocol following bolus administration of intravenous contrast. CONTRAST:  8mL ISOVUE-300 IOPAMIDOL (ISOVUE-300) INJECTION 61% COMPARISON:  09/19/2016 FINDINGS: Lower Chest: Increased bibasilar atelectasis. Hepatobiliary: No masses identified. Contrast seen within the gallbladder from recent CT. Pancreas:  No mass or inflammatory changes. Spleen: Within normal limits in size and appearance. Adrenals/Urinary Tract: No masses identified. No evidence of hydronephrosis. Stomach/Bowel: A large amount of free intraperitoneal air is new since previous study, consistent with bowel perforation. Moderate sigmoid colon wall thickening and pericolonic inflammatory changes show no significant change with associated scattered colonic diverticula. This remains consistent with colitis or diverticulitis, and is likely the site of bowel perforation. Small amount of free fluid is seen in the pelvic cul-de-sac, however no abscess identified. No evidence of bowel obstruction. Normal appendix visualized. Vascular/Lymphatic: No pathologically enlarged lymph nodes. No abdominal aortic aneurysm. Aortic atherosclerosis. Reproductive: Previous hysterectomy. Adnexal regions are unremarkable. Small amount of free fluid seen in the pelvic cul-de-sac. Other:  None. Musculoskeletal:  No suspicious bone lesions identified. IMPRESSION: Large amount of free intraperitoneal air, consistent with bowel perforation. Small amount of free fluid in pelvic cul-de-sac, but no abscess identified. No significant change in moderate sigmoid colonic wall thickening and pericolonic inflammatory change, consistent with diverticulitis or colitis. Increased bibasilar atelectasis. Critical Value/emergent results were called by telephone at  the time of interpretation on 09/21/2016 at 12:41 pm to Dr. Patricia Pesa MODY , who verbally acknowledged these results. Electronically Signed   By: Myles Rosenthal M.D.   On: 09/21/2016  12:41   Dg Chest Portable 1 View  Result Date: 09/21/2016 CLINICAL DATA:  Status post central line placement EXAM: PORTABLE CHEST 1 VIEW COMPARISON:  None. FINDINGS: Cardiac shadow is mildly enlarged in size. Increased density is noted in the left lung base likely representing focal infiltrate and small effusion. Endotracheal tube is noted extending into the right mainstem bronchus which may account for some of the changes in the left base. Nasogastric catheter is noted within the stomach. Central line is seen at the cavoatrial junction. No pneumothorax is seen. IMPRESSION: Tubes and lines as described above. A portion of the changes in the left base may be related to the placement of the endotracheal tube is in the right mainstem bronchus. This should be withdrawn 3-4 cm. Changes in the left base which may be in part due to the endotracheal tube placement. These results were called by telephone at the time of interpretation on 09/21/2016 at 6:34 pm to [facility] the pts nurse in PACU, who verbally acknowledged these results. Electronically Signed   By: Alcide Clever M.D.   On: 09/21/2016 18:36    Assessment/Plan: She remains critically ill with ongoing sepsis. She's on antibiotic therapy. We appreciate the intensivist care. She will need to return to the operating room in the next 24-48 hours for an attempted abdominal washout and reconstruction of her GI continuity. We will reevaluate her tomorrow morning.

## 2016-09-22 NOTE — Progress Notes (Signed)
Initial Nutrition Assessment  DOCUMENTATION CODES:   Not applicable  INTERVENTION:  -Pt does not meet clinical characteristics for malnutrition but is at nutrtional risk due to inadequate nutritional intake >/= 7 days and increased metabolic requirements. Pt currently on vent support with plans to return to OR. Recommend initiation of nutrition as soon as clinically feasible with recommendations for TPN initiation within 3-5 days if unable to initiate oral diet or enteral nutrition.    NUTRITION DIAGNOSIS:   Inadequate oral intake related to acute illness as evidenced by NPO status.  GOAL:   Provide needs based on ASPEN/SCCM guidelines  MONITOR:   Vent status, Labs, Weight trends, I & O's  REASON FOR ASSESSMENT:   Ventilator    ASSESSMENT:    70 yo female admitted with abdominal pain with N/V with acute diverticulitis and perforated bowel s/p colon resection on 10/29  Patient is currently intubated on ventilator support MV: 8.2 L/min Temp (24hrs), Avg:97.9 F (36.6 C), Min:96.8 F (36 C), Max:99.2 F (37.3 C)  Plan to return to OR tomorrow for further exploration.   UOP poor, pt with 480 mL in previous 24 hours; 250 mL documented thus far today  Pt hypoglycemic this AM, plan to start D5 infusion per MD during rounds. Potassium elevated, plan to recheck.   NG tube in place with tip located in stomach. Abdomen obese/distended, faint/hypoactive BS  Pt's sons at bedside and report pt typically has good appetite until the last week; pt last meal was 1 week ago (10/25) and pt ate fried chicken and macaroni cheese and began having abdominal pain. Since then, pt with minimal po intake and vomiting since Friday 10/27. They report pt weight has been stable  Nutrition-Focused physical exam completed. Findings are WDL for fat depletion, muscle depletion, and edema.    Diet Order:  Diet NPO time specified  Skin:  Reviewed, no issues  Last BM:  10/25   Labs: potassium 5.7,  Creatinine 1.19 Glucose Profile:   Recent Labs  09/21/16 1407 09/22/16 0807 09/22/16 0841  GLUCAP 165* 59* 144*    Meds: NS at 75 ml/hr, fentanyl/versed  Height:   Ht Readings from Last 1 Encounters:  09/21/16 5\' 7"  (1.702 m)    Weight:   Wt Readings from Last 1 Encounters:  09/21/16 194 lb (88 kg)   BMI:  Body mass index is 30.38 kg/m.  Estimated Nutritional Needs:   Kcal:  09/23/16 kcals  (11-14 kcals/kg) or 1634 kcals (70% is 1143 kcals)  Protein:  >/= 123 g  Fluid:  >/= 2 L  EDUCATION NEEDS:   No education needs identified at this time  [phone] MS, RD, LDN [phone] Pager  [phone] Weekend/On-Call Pager

## 2016-09-22 NOTE — Progress Notes (Signed)
RN spoke with Dr. Belia Heman and made MD aware of K+ 5.7, Stach rate 137 consistently all morning and blood glucose 59 this morning treated with amp of D50.  RN asked about need for CBG monitoring and levophed drip since patient's MAP is consistently staying 65-66.  MD gave order to recheck potassium, check lactic acid and ABG.  MD also gave order to change IVF to d5 NS @ 75 cc/H and to check CVP.

## 2016-09-22 NOTE — Anesthesia Postprocedure Evaluation (Signed)
Anesthesia Post Note  Patient: Ruth Gray  Procedure(s) Performed: Procedure(s) (LRB): EXPLORATORY LAPAROTOMY (N/A) COLON RESECTION- Ascending and Sigmoid INSERTION CENTRAL LINE ADULT  Patient location during evaluation: ICU Anesthesia Type: General Level of consciousness: awake, sedated and patient remains intubated per anesthesia plan Pain management: pain level controlled Vital Signs Assessment: post-procedure vital signs reviewed and stable Respiratory status: patient on ventilator - see flowsheet for VS Cardiovascular status: stable Anesthetic complications: no    Last Vitals:  Vitals:   09/22/16 0730 09/22/16 0745  BP: 94/69 94/66  Pulse: (!) 133 (!) 134  Resp: 16 14  Temp:      Last Pain:  Vitals:   09/22/16 0028  TempSrc:   PainSc: Asleep                 Vernee Baines,  Sheran Fava

## 2016-09-22 NOTE — Progress Notes (Addendum)
RN made Dr. Belia Heman aware of post bolus CVP of 5 and that blood pressure has improved with bolus but that heart rate remains elevated rate 139.  MD acknowledged and stated he would order another bolus.

## 2016-09-22 NOTE — Progress Notes (Signed)
Blood glucose was 59 and amp of D50 given.  CBG checked and is now 144.  Will speak with Dr. Belia Heman about checking CBG scheduled.

## 2016-09-23 DIAGNOSIS — E872 Acidosis: Secondary | ICD-10-CM

## 2016-09-23 LAB — BLOOD GAS, ARTERIAL
Acid-base deficit: 0.9 mmol/L (ref 0.0–2.0)
Bicarbonate: 26.1 mmol/L (ref 20.0–28.0)
FIO2: 0.35
LHR: 15 {breaths}/min
O2 Saturation: 92.4 %
PEEP/CPAP: 5 cmH2O
Patient temperature: 37
pCO2 arterial: 53 mmHg — ABNORMAL HIGH (ref 32.0–48.0)
pH, Arterial: 7.3 — ABNORMAL LOW (ref 7.350–7.450)
pO2, Arterial: 72 mmHg — ABNORMAL LOW (ref 83.0–108.0)

## 2016-09-23 LAB — GLUCOSE, CAPILLARY
GLUCOSE-CAPILLARY: 97 mg/dL (ref 65–99)
GLUCOSE-CAPILLARY: 116 mg/dL — AB (ref 65–99)
GLUCOSE-CAPILLARY: 106 mg/dL — AB (ref 65–99)
Glucose-Capillary: 101 mg/dL — ABNORMAL HIGH (ref 65–99)
Glucose-Capillary: 121 mg/dL — ABNORMAL HIGH (ref 65–99)
Glucose-Capillary: 93 mg/dL (ref 65–99)
Glucose-Capillary: 95 mg/dL (ref 65–99)

## 2016-09-23 LAB — BASIC METABOLIC PANEL
ANION GAP: 3 — AB (ref 5–15)
BUN: 33 mg/dL — ABNORMAL HIGH (ref 6–20)
CO2: 24 mmol/L (ref 22–32)
Calcium: 6.6 mg/dL — ABNORMAL LOW (ref 8.9–10.3)
Chloride: 113 mmol/L — ABNORMAL HIGH (ref 101–111)
Creatinine, Ser: 0.83 mg/dL (ref 0.44–1.00)
GFR calc Af Amer: >60 mL/min (ref >60)
GLUCOSE: 98 mg/dL (ref 65–99)
POTASSIUM: 4.5 mmol/L (ref 3.5–5.1)
Sodium: 140 mmol/L (ref 135–145)

## 2016-09-23 LAB — SURGICAL PATHOLOGY

## 2016-09-23 MED ORDER — MEROPENEM-SODIUM CHLORIDE 1 GM/50ML IV SOLR
1.0000 g | Freq: Three times a day (TID) | INTRAVENOUS | Status: AC
Start: 2016-09-23 — End: 2016-10-01
  Administered 2016-09-23 – 2016-10-01 (×25): 1 g via INTRAVENOUS
  Filled 2016-09-23 (×27): qty 50

## 2016-09-23 NOTE — Progress Notes (Signed)
Pharmacy Antibiotic Note  Ruth Gray is a 70 y.o. female with a h/o RA admitted on 09/19/2016 with abdominal pain now s/p colon resection for perforated bowel.  Pharmacy has been consulted for meropenem dosing.  Plan: Will increase meropenem to 1 g iv q 8 hours with improvement in renal function.   Height: 5\' 7"  (170.2 cm) Weight: 194 lb (88 kg) IBW/kg (Calculated) : 61.6  Temp (24hrs), Avg:100.1 F (37.8 C), Min:100 F (37.8 C), Max:100.4 F (38 C)   Recent Labs Lab 09/19/16 1028 09/20/16 0508 09/21/16 0517 09/21/16 1039 09/22/16 0406 09/22/16 1103 09/23/16 1139  WBC 9.1 6.4  --   --  12.5*  --   --   CREATININE 1.07* 0.83 1.31* 1.75* 1.19*  --  0.83  LATICACIDVEN  --   --   --   --   --  1.6  --     Estimated Creatinine Clearance: 72.9 mL/min (by C-G formula based on SCr of 0.83 mg/dL).    Allergies  Allergen Reactions  . Ciprofloxacin Swelling  . Tramadol Nausea And Vomiting  . Penicillins Swelling and Rash    Has patient had a PCN reaction causing immediate rash, facial/tongue/throat swelling, SOB or lightheadedness with hypotension: {no Has patient had a PCN reaction causing severe rash involving mucus membranes or skin necrosis: no Has patient had a PCN reaction that required hospitalization no Has patient had a PCN reaction occurring within the last 10 years: no If all of the above answers are "NO", then may proceed with Cephalosporin use.     Antimicrobials this admission: Aztreonam 10/29 >> 10/31 Metronidazole 10/29 >> 10/31 Meropenem 11/1 >>  Dose adjustments this admission:   Microbiology results: 10/31 MRSA PCR: negative  Thank you for allowing pharmacy to be a part of this patient's care.  11/31 D 09/23/2016 4:58 PM

## 2016-09-23 NOTE — Consult Note (Signed)
PULMONARY / CRITICAL CARE MEDICINE   Name: Ruth Gray MRN: [id number] DOB: October 06, 1946    ADMISSION DATE:  09/19/2016 CONSULTATION DATE: 09/21/16  REFERRING MD:  Dr. Rayna Sexton  CHIEF COMPLAINT: Post op Vent Management s/p Perforated bowel  HISTORY OF PRESENT ILLNESS:   Ruth Gray is a 70 yo female with past medical history significant for RA on immunosuppressive therapy. Patient presents on 10/29 to [facility] with persistent abdominal pain with intractable nausea and vomiting. CT of the abdomen was concerning for diverticulitis versus colitis.Marland Kitchen She was treated with oral antibiotics as an outpatient. She did not improve despite of antibiotic use. She was admitted with elevated WBC.  Patient was evaluated by gastroenterologist on 10/31 with increased abdominal pain and shortness of breath and the repeat CT  Scan demonstrated a massive amount of intra- abdominal free air consistent with a bowel perforation.  Patient underwent exploratory laprotomy s/p bowel perforation with intention to return to surgery likely 20-48 hours for re-exploration attempt.  Patient was sent to ICU intubated and mechanically ventillated with PCCM Consult on for vent management.    SUBJECTIVE Remains intubated,sedated Critically ill On vasopressors  SIGNIFICANT EVENTS 10/31 EXPLORATORY LAPAROTOMY (N/A) COLON RESECTION- Ascending and Sigmoid INSERTION CENTRAL LINE ADULT  11/2 remains intubated,sedated, plan for re-exploration 11/3  Allergies  Allergen Reactions  . Ciprofloxacin Swelling  . Tramadol Nausea And Vomiting  . Penicillins Swelling and Rash    Has patient had a PCN reaction causing immediate rash, facial/tongue/throat swelling, SOB or lightheadedness with hypotension: {no Has patient had a PCN reaction causing severe rash involving mucus membranes or skin necrosis: no Has patient had a PCN reaction that required hospitalization no Has patient had a PCN reaction occurring  within the last 10 years: no If all of the above answers are "NO", then may proceed with Cephalosporin use.     No current facility-administered medications on file prior to encounter.    Current Outpatient Prescriptions on File Prior to Encounter  Medication Sig  . Calcium Carb-Cholecalciferol (CALCIUM + D3 PO) Take by mouth.  . InFLIXimab (REMICADE IV) Inject into the vein once a week.  . Methotrexate, Anti-Rheumatic, (METHOTREXATE, PF, [place]) Inject into the skin.    REVIEW OF SYSTEMS:   Unable to obtain-critically ill    VITAL SIGNS: BP 98/64   Pulse (!) 124   Temp (!) 100.4 F (38 C) (Axillary)   Resp 15   Ht 5\' 7"  (1.702 m)   Wt 194 lb (88 kg)   SpO2 97%   BMI 30.38 kg/m   HEMODYNAMICS: CVP:  [5 mmHg-14 mmHg] 12 mmHg  VENTILATOR SETTINGS: Vent Mode: PRVC FiO2 (%):  [35 %] 35 % Set Rate:  [15 bmp] 15 bmp Vt Set:  [450 mL] 450 mL PEEP:  [5 cmH20] 5 cmH20 Plateau Pressure:  [13 cmH20] 13 cmH20  INTAKE / OUTPUT: I/O last 3 completed shifts: In: 5717.5 [I.V.:3617.5; IV Piggyback:2100] Out: 905 [Urine:905]  - Physical Examination:   GENERAL:critically ill appearing, +resp distress HEAD: Normocephalic, atraumatic.  EYES: Pupils equal, round, reactive to light.  No scleral icterus.  MOUTH: Moist mucosal membrane. NECK: Supple. No thyromegaly. No nodules. No JVD. c collar in place PULMONARY: - -wheezes +rhonchi CARDIOVASCULAR: S1 and S2. Regular rate and rhythm. No murmurs, rubs, or gallops.  GASTROINTESTINAL: Soft, nontender, +distended. No masses. - bowel sounds. No hepatosplenomegaly.  MUSCULOSKELETAL: No swelling, clubbing, or edema.  NEUROLOGIC: GCS<8T SKIN:intact,warm,dry    LABS:  BMET  Recent Labs Lab  09/21/16 0517 09/21/16 1039 09/21/16 1435 09/22/16 0406 09/22/16 1103  NA 134* 135 137 135  --   K 4.6 4.8 4.8 5.7* 5.7*  CL 108 107  --  111  --   CO2 19* 20*  --  20*  --   BUN 21* 23*  --  29*  --   CREATININE 1.31* 1.75*  --  1.19*   --   GLUCOSE 116* 112* 115* 62*  --     Electrolytes  Recent Labs Lab 09/21/16 0517 09/21/16 1039 09/22/16 0406  CALCIUM 7.9* 7.9* 7.1*    CBC  Recent Labs Lab 09/19/16 1028 09/20/16 0508 09/21/16 1435 09/22/16 0406  WBC 9.1 6.4  --  12.5*  HGB 13.7 11.8* 11.9* 11.7*  HCT 40.2 34.9* 35.0* 34.7*  PLT 259 229  --  269    Coag's No results for input(s): APTT, INR in the last 168 hours.  Sepsis Markers  Recent Labs Lab 09/22/16 1103  LATICACIDVEN 1.6    ABG  Recent Labs Lab 09/21/16 1840 09/22/16 1130  PHART 7.27* 7.22*  PCO2ART 39 46  PO2ART 120* 69*    Liver Enzymes  Recent Labs Lab 09/19/16 1028 09/20/16 0508  AST 31 25  ALT 19 18  ALKPHOS 83 67  BILITOT 0.7 0.7  ALBUMIN 3.7 3.0*    Cardiac Enzymes No results for input(s): TROPONINI, PROBNP in the last 168 hours.  Glucose  Recent Labs Lab 09/22/16 1114 09/22/16 1604 09/22/16 2007 09/23/16 0015 09/23/16 0418 09/23/16 0710  GLUCAP 119* 121* 102* 93 97 116*    -  STUDIES:  10/29 CT abdomen>>Diverticulosis of descending and proximal sigmoid colon is noted.Diffuse wall thickening of sigmoid colon is noted with surroundinginflammation most consistent with diverticulitis or colitis.The more proximal colon is dilated and air-filled suggesting ileusor possibly distal colonic obstruction. 10/31 CT abdomen>>Large amount of free intraperitoneal air, consistent with bowelperforation. Small amount of free fluid in pelvic cul-de-sac, but noabscess identified.   CULTURES: None   ANTIBIOTICS: 10/31 Meropenem>>  SIGNIFICANT EVENTS: 10/31 patient admitted to [facility] ICU status post exploratory laparotomy due to bowel perforation  LINES/TUBES: 10/31 Right IJ>>  DISCUSSION: 70 YO Female with perforated bowel and diverticulitis now s/p exploratory laparotomy on 10/31 with plans of re-exploration attemt in 24-48 hours.  ASSESSMENT / PLAN:  PULMONARY A: Acute respiratory failure P:    Continue full vent support Fentanyl/ versed for sedation Routine ABG CR as needed  CARDIOVASCULAR A:  No active isssues P:  Continuous telemetry Keep MAP goals>65  RENAL A:   Acute kidney injury P:   Strict I/o Trend chemistry Replace electrolytes per ICU protocol  GASTROINTESTINAL A:   Exploratory laparotomy -10/31, s/p perforated bowel P:   Keep NPO Gastroenterologist following the patient Plans for re-exploration in 24-48 hours Famotidine for GIP Zofran for nausea Keep dressing/incision CDI Follow up gen surgery recs  HEMATOLOGIC A:   No active issues P:  Heparin for DVT prophylaxis Transfuse if Hgb<7  INFECTIOUS A:   Exploratory laparotomy s/p bowel perforation P:   Monitor fever curve Continue Meropenem for intraabdominal infections  ENDOCRINE A:   No active issues P:   Monitor BS intermittently with BMP  NEUROLOGIC A:   No active issues P:   RASS goal: 0   I have personally obtained a history, examined the patient, evaluated Pertinent laboratory and RadioGraphic/imaging results, and  formulated the assessment and plan   The Patient requires high complexity decision making for assessment and support, frequent evaluation  and titration of therapies, application of advanced monitoring technologies and extensive interpretation of multiple databases. Critical Care Time devoted to patient care services described in this note is 40 minutes.   Overall, patient is critically ill, prognosis is guarded.  Patient with Multiorgan failure and at high risk for cardiac arrest and death.    Lucie Leather, M.D.  [facility]  Medical Director [facility] Medical Director [facility] Cardio-Pulmonary Department

## 2016-09-23 NOTE — Progress Notes (Signed)
She is improved hemodynamically today. She remains mildly acidotic. Her blood pressures up to near 100 and heart rate is down to the 120s. She remains intubated and ventilated. We will delay her repeat surgical exploration until tomorrow. I discussed the plan with nursing staff. I think she will be a better candidate for a return to surgery for reconstruction if she continues to improve.

## 2016-09-24 LAB — CBC WITH DIFFERENTIAL/PLATELET
Basophils Absolute: 0 10*3/uL (ref 0–0.1)
Basophils Relative: 0 %
EOS ABS: 0.1 10*3/uL (ref 0–0.7)
Eosinophils Relative: 1 %
HEMATOCRIT: 26.9 % — AB (ref 35.0–47.0)
HEMOGLOBIN: 8.9 g/dL — AB (ref 12.0–16.0)
LYMPHS ABS: 0.9 10*3/uL — AB (ref 1.0–3.6)
Lymphocytes Relative: 9 %
MCH: 32.1 pg (ref 26.0–34.0)
MCHC: 33.2 g/dL (ref 32.0–36.0)
MCV: 96.9 fL (ref 80.0–100.0)
MONO ABS: 1.4 10*3/uL — AB (ref 0.2–0.9)
MONOS PCT: 14 %
NEUTROS PCT: 76 %
Neutro Abs: 7.4 10*3/uL — ABNORMAL HIGH (ref 1.4–6.5)
Platelets: 282 10*3/uL (ref 150–440)
RBC: 2.78 MIL/uL — ABNORMAL LOW (ref 3.80–5.20)
RDW: 15.3 % — ABNORMAL HIGH (ref 11.5–14.5)
WBC: 9.9 10*3/uL (ref 3.6–11.0)

## 2016-09-24 LAB — BASIC METABOLIC PANEL
Anion gap: 1 — ABNORMAL LOW (ref 5–15)
BUN: 30 mg/dL — AB (ref 6–20)
CHLORIDE: 107 mmol/L (ref 101–111)
CO2: 32 mmol/L (ref 22–32)
CREATININE: 0.67 mg/dL (ref 0.44–1.00)
Calcium: 7 mg/dL — ABNORMAL LOW (ref 8.9–10.3)
GFR calc Af Amer: >60 mL/min (ref >60)
GFR calc non Af Amer: >60 mL/min (ref >60)
Glucose, Bld: 106 mg/dL — ABNORMAL HIGH (ref 65–99)
Potassium: 4.3 mmol/L (ref 3.5–5.1)
SODIUM: 140 mmol/L (ref 135–145)

## 2016-09-24 LAB — PHOSPHORUS: Phosphorus: 1.7 mg/dL — ABNORMAL LOW (ref 2.5–4.6)

## 2016-09-24 LAB — GLUCOSE, CAPILLARY
GLUCOSE-CAPILLARY: 104 mg/dL — AB (ref 65–99)
GLUCOSE-CAPILLARY: 93 mg/dL (ref 65–99)
Glucose-Capillary: 104 mg/dL — ABNORMAL HIGH (ref 65–99)
Glucose-Capillary: 78 mg/dL (ref 65–99)
Glucose-Capillary: 106 mg/dL — ABNORMAL HIGH (ref 65–99)

## 2016-09-24 LAB — MAGNESIUM: Magnesium: 1.8 mg/dL (ref 1.7–2.4)

## 2016-09-24 MED ORDER — SODIUM CHLORIDE 0.9 % IV BOLUS (SEPSIS)
250.0000 mL | Freq: Once | INTRAVENOUS | Status: AC
Start: 2016-09-24 — End: 2016-09-24
  Administered 2016-09-24: 250 mL via INTRAVENOUS

## 2016-09-24 MED ORDER — MAGNESIUM SULFATE 2 GM/50ML IV SOLN
2.0000 g | Freq: Once | INTRAVENOUS | Status: AC
  Administered 2016-09-24: 2 g via INTRAVENOUS
  Filled 2016-09-24: qty 50

## 2016-09-24 MED ORDER — SODIUM PHOSPHATES 45 MMOLE/15ML IV SOLN
30.0000 mmol | Freq: Once | INTRAVENOUS | Status: AC
  Administered 2016-09-24: 30 mmol via INTRAVENOUS
  Filled 2016-09-24: qty 10

## 2016-09-24 NOTE — Consult Note (Signed)
PULMONARY / CRITICAL CARE MEDICINE   Name: Ruth Gray MRN: [id number] DOB: [birth year]    ADMISSION DATE:  09/19/2016 CONSULTATION DATE: 09/21/16  REFERRING MD:  Dr. Rayna Sexton  CHIEF COMPLAINT: Post op Vent Management s/p Perforated bowel  HISTORY OF PRESENT ILLNESS:   Ruth Gray is a 70 yo female with past medical history significant for RA on immunosuppressive therapy. Patient presents on 10/29 to [facility] with persistent abdominal pain with intractable nausea and vomiting. CT of the abdomen was concerning for diverticulitis versus colitis.Marland Kitchen She was treated with oral antibiotics as an outpatient. She did not improve despite of antibiotic use. She was admitted with elevated WBC.  Patient was evaluated by gastroenterologist on 10/31 with increased abdominal pain and shortness of breath and the repeat CT  Scan demonstrated a massive amount of intra- abdominal free air consistent with a bowel perforation.  Patient underwent exploratory laprotomy s/p bowel perforation with intention to return to surgery likely 20-48 hours for re-exploration attempt.  Patient was sent to ICU intubated and mechanically ventillated with PCCM Consult on for vent management.   SUBJECTIVE Remains intubated,sedated Critically ill On vasopressors  SIGNIFICANT EVENTS 10/31 EXPLORATORY LAPAROTOMY (N/A) COLON RESECTION- Ascending and Sigmoid INSERTION CENTRAL LINE ADULT  11/2 remains intubated,sedated, plan for re-exploration 11/4  Allergies  Allergen Reactions  . Ciprofloxacin Swelling  . Tramadol Nausea And Vomiting  . Penicillins Swelling and Rash    Has patient had a PCN reaction causing immediate rash, facial/tongue/throat swelling, SOB or lightheadedness with hypotension: {no Has patient had a PCN reaction causing severe rash involving mucus membranes or skin necrosis: no Has patient had a PCN reaction that required hospitalization no Has patient had a PCN reaction occurring  within the last 10 years: no If all of the above answers are "NO", then may proceed with Cephalosporin use.     No current facility-administered medications on file prior to encounter.    Current Outpatient Prescriptions on File Prior to Encounter  Medication Sig  . Calcium Carb-Cholecalciferol (CALCIUM + D3 PO) Take by mouth.  . InFLIXimab (REMICADE IV) Inject into the vein once a week.  . Methotrexate, Anti-Rheumatic, (METHOTREXATE, PF, [place]) Inject into the skin.    REVIEW OF SYSTEMS:   Unable to obtain-critically ill    VITAL SIGNS: BP 112/62   Pulse (!) 112   Temp 99.5 F (37.5 C) (Oral)   Resp 15   Ht 5\' 7"  (1.702 m)   Wt 209 lb 14.1 oz (95.2 kg)   SpO2 97%   BMI 32.87 kg/m   HEMODYNAMICS: CVP:  [3 mmHg-19 mmHg] 19 mmHg  VENTILATOR SETTINGS: Vent Mode: PRVC FiO2 (%):  [35 %] 35 % Set Rate:  [15 bmp] 15 bmp Vt Set:  [450 mL] 450 mL PEEP:  [5 cmH20] 5 cmH20  INTAKE / OUTPUT: I/O last 3 completed shifts: In: 3491.2 [I.V.:3191.2; IV Piggyback:300] Out: 1590 [Urine:1415; Emesis/NG output:175]  - Physical Examination:   GENERAL:critically ill appearing, +resp distress HEAD: Normocephalic, atraumatic.  EYES: Pupils equal, round, reactive to light.  No scleral icterus.  MOUTH: Moist mucosal membrane. NECK: Supple. No thyromegaly. No nodules. No JVD. c collar in place PULMONARY: - -wheezes +rhonchi CARDIOVASCULAR: S1 and S2. Regular rate and rhythm. No murmurs, rubs, or gallops.  GASTROINTESTINAL: Soft, nontender, +distended. No masses. - bowel sounds. No hepatosplenomegaly.  MUSCULOSKELETAL: No swelling, clubbing, or edema.  NEUROLOGIC: GCS<8T SKIN:intact,warm,dry    LABS:  BMET  Recent Labs Lab 09/21/16 1039 09/21/16 1435 09/22/16  0406 09/22/16 1103 09/23/16 1139  NA 135 137 135  --  140  K 4.8 4.8 5.7* 5.7* 4.5  CL 107  --  111  --  113*  CO2 20*  --  20*  --  24  BUN 23*  --  29*  --  33*  CREATININE 1.75*  --  1.19*  --  0.83  GLUCOSE  112* 115* 62*  --  98    Electrolytes  Recent Labs Lab 09/21/16 1039 09/22/16 0406 09/23/16 1139  CALCIUM 7.9* 7.1* 6.6*    CBC  Recent Labs Lab 09/19/16 1028 09/20/16 0508 09/21/16 1435 09/22/16 0406  WBC 9.1 6.4  --  12.5*  HGB 13.7 11.8* 11.9* 11.7*  HCT 40.2 34.9* 35.0* 34.7*  PLT 259 229  --  269    Coag's No results for input(s): APTT, INR in the last 168 hours.  Sepsis Markers  Recent Labs Lab 09/22/16 1103  LATICACIDVEN 1.6    ABG  Recent Labs Lab 09/21/16 1840 09/22/16 1130 09/23/16 1100  PHART 7.27* 7.22* 7.30*  PCO2ART 39 46 53*  PO2ART 120* 69* 72*    Liver Enzymes  Recent Labs Lab 09/19/16 1028 09/20/16 0508  AST 31 25  ALT 19 18  ALKPHOS 83 67  BILITOT 0.7 0.7  ALBUMIN 3.7 3.0*    Cardiac Enzymes No results for input(s): TROPONINI, PROBNP in the last 168 hours.  Glucose  Recent Labs Lab 09/23/16 0710 09/23/16 1213 09/23/16 1630 09/23/16 2003 09/23/16 2345 09/24/16 0434  GLUCAP 116* 101* 121* 106* 95 104*    -  STUDIES:  10/29 CT abdomen>>Diverticulosis of descending and proximal sigmoid colon is noted.Diffuse wall thickening of sigmoid colon is noted with surroundinginflammation most consistent with diverticulitis or colitis.The more proximal colon is dilated and air-filled suggesting ileusor possibly distal colonic obstruction. 10/31 CT abdomen>>Large amount of free intraperitoneal air, consistent with bowelperforation. Small amount of free fluid in pelvic cul-de-sac, but noabscess identified.   CULTURES: None   ANTIBIOTICS: 10/31 Meropenem>>  SIGNIFICANT EVENTS: 10/31 patient admitted to [facility] ICU status post exploratory laparotomy due to bowel perforation  LINES/TUBES: 10/31 Right IJ>>  DISCUSSION: 70 YO Female with perforated bowel and diverticulitis now s/p exploratory laparotomy on 10/31 with plans of re-exploration attemt on 11/4  ASSESSMENT / PLAN:  PULMONARY A: Acute respiratory  failure P:   Continue full vent support Fentanyl/ versed for sedation Routine ABG CXR as needed  CARDIOVASCULAR A:  No active isssues P:  Continuous telemetry Keep MAP goals>65  RENAL A:   Acute kidney injury P:   Strict I/o Trend chemistry Replace electrolytes per ICU protocol  GASTROINTESTINAL A:   Exploratory laparotomy -10/31, s/p perforated bowel P:   Keep NPO Gastroenterologist following the patient Plans for re-exploration 11/4 Famotidine for GIP Zofran for nausea Keep dressing/incision CDI Follow up gen surgery recs  HEMATOLOGIC A:   No active issues P:  Heparin for DVT prophylaxis Transfuse if Hgb<7  INFECTIOUS A:   Exploratory laparotomy s/p bowel perforation P:   Monitor fever curve Continue Meropenem for intraabdominal infections  ENDOCRINE A:   No active issues P:   Monitor BS intermittently with BMP  NEUROLOGIC A:   No active issues P:   RASS goal: 0   I have personally obtained a history, examined the patient, evaluated Pertinent laboratory and RadioGraphic/imaging results, and  formulated the assessment and plan   The Patient requires high complexity decision making for assessment and support, frequent evaluation and titration of  therapies, application of advanced monitoring technologies and extensive interpretation of multiple databases. Critical Care Time devoted to patient care services described in this note is 35 minutes.   Overall, patient is critically ill, prognosis is guarded.  Patient with Multiorgan failure and at high risk for cardiac arrest and death.    Lucie Leather, M.D.  [facility]  Medical Director [facility] Medical Director [facility] Cardio-Pulmonary Department

## 2016-09-24 NOTE — Progress Notes (Signed)
She appears to be stable this morning. Her vital signs are about the same with heart rate in the 110s to 120. Her blood pressures round 100. I examined her wound which looks good with some moderate drainage of both hands. Her laboratory values remained stable although she's mildly acidotic. We will plan to return her to surgery tomorrow morning in order to see if we can put her bowel back in continuity or create an ileostomy. I discussed this plan with both of her children and they are in agreement. I've also discussed the plan with the nursing staff.  We appreciate the intensivist care help in this situation this lady is done really well with such critical illness.

## 2016-09-24 NOTE — Progress Notes (Addendum)
Nutrition Follow-up  DOCUMENTATION CODES:   Not applicable  INTERVENTION:  -Discussed nutritional poc during ICU rounds and with MD Michela Pitcher. Pt with inadequate nutrition for 5 days since admission, with minimal intake for 4 days prior to this. Pt currently on vent with plan to go back to OR tomorrow for re-exploration to possibley put bowel back together or create ileostomy. Prognosis guarded per MD. Discussed possibility of initiation of TPN with MD Michela Pitcher, pt has right IJ CVC. MD would like to wait and see how surgery goes tomorrow and then will assess for TPN.  -If TPN initiated, recommend starting 5%AA/15%Dextrose at rate of 40 ml/hr with goal of 80 ml/hr providing 96 g protein, 1363 kcals, 1920 mL of fluid.  80% protein needs, >100% calorie needs for hypocaloric feeding per ASPEN but still underfeeding. Phosphorus low this AM, being supplemented at present. If TPN started this weekend, will assess for initiation of Lipid infusion on Monday. Of note, national TPN shortage at present time and TPN recommendations may need to be adjusted based on availability.    NUTRITION DIAGNOSIS:   Inadequate oral intake related to acute illness as evidenced by NPO status.  Continues  GOAL:   Provide needs based on ASPEN/SCCM guidelines  MONITOR:   Vent status, Labs, Weight trends, I & O's  REASON FOR ASSESSMENT:   Ventilator    ASSESSMENT:    Pt remains on vent, s/p surgery for perforated bowel on 10/31. Bowel was not reanastomosed during surgery and plan for return to OR tomorrow for further intervention.   Pt has right IJ triple lumen CVC. Pt not currently on pressors. Pt with 1+generalized edema UOP 1090 mL, NG output documented at 175 mL  Diet Order:  Diet NPO time specified  Skin:  Reviewed, no issues  Last BM:  10/25  Labs: phosphorus 1.7  Meds: reviewed  Height:   Ht Readings from Last 1 Encounters:  09/21/16 5\' 7"  (1.702 m)    Weight:   Wt Readings from Last 1 Encounters:   09/24/16 209 lb 14.1 oz (95.2 kg)    Filed Weights   09/21/16 0633 09/21/16 1423 09/24/16 0500  Weight: 194 lb 14.4 oz (88.4 kg) 194 lb (88 kg) 209 lb 14.1 oz (95.2 kg)    BMI:  Body mass index is 32.87 kg/m.  Estimated Nutritional Needs:   Kcal:  13/03/17 kcals  (11-14 kcals/kg) or 1634 kcals (70% is 1143 kcals)  Protein:  >/= 123 g  Fluid:  >/= 2 L  EDUCATION NEEDS:   No education needs identified at this time  [phone] MS, RD, LDN [phone] Pager  [phone] Weekend/On-Call Pager

## 2016-09-24 NOTE — Progress Notes (Signed)
Pharmacy Antibiotic Note  Ruth Gray is a 70 y.o. female with a h/o RA admitted on 09/19/2016 with abdominal pain now s/p colon resection for perforated bowel.  Pharmacy has been consulted for meropenem dosing ane electrolyte monitoring.   Plan: 1. Will continue meropenem 1 g iv q 8 hours.   2. Magnesium 2 g iv once and Sodium phos 30 mmol iv once and recheck in AM.   Height: 5\' 7"  (170.2 cm) Weight: 209 lb 14.1 oz (95.2 kg) IBW/kg (Calculated) : 61.6  Temp (24hrs), Avg:99.5 F (37.5 C), Min:99 F (37.2 C), Max:100 F (37.8 C)   Recent Labs Lab 09/19/16 1028 09/20/16 0508 09/21/16 0517 09/21/16 1039 09/22/16 0406 09/22/16 1103 09/23/16 1139 09/24/16 0745  WBC 9.1 6.4  --   --  12.5*  --   --  9.9  CREATININE 1.07* 0.83 1.31* 1.75* 1.19*  --  0.83 0.67  LATICACIDVEN  --   --   --   --   --  1.6  --   --     Estimated Creatinine Clearance: 78.6 mL/min (by C-G formula based on SCr of 0.67 mg/dL).    Allergies  Allergen Reactions  . Ciprofloxacin Swelling  . Tramadol Nausea And Vomiting  . Penicillins Swelling and Rash    Has patient had a PCN reaction causing immediate rash, facial/tongue/throat swelling, SOB or lightheadedness with hypotension: {no Has patient had a PCN reaction causing severe rash involving mucus membranes or skin necrosis: no Has patient had a PCN reaction that required hospitalization no Has patient had a PCN reaction occurring within the last 10 years: no If all of the above answers are "NO", then may proceed with Cephalosporin use.     Antimicrobials this admission: Aztreonam 10/29 >> 10/31 Metronidazole 10/29 >> 10/31 Meropenem 11/1 >>  Dose adjustments this admission:   Microbiology results: 10/31 MRSA PCR: negative  Thank you for allowing pharmacy to be a part of this patient's care.  11/31 D 09/24/2016 3:15 PM

## 2016-09-25 ENCOUNTER — Inpatient Hospital Stay: Payer: Medicare Other | Admitting: Anesthesiology

## 2016-09-25 ENCOUNTER — Encounter: Payer: Self-pay | Admitting: Anesthesiology

## 2016-09-25 ENCOUNTER — Encounter
Admission: EM | Disposition: A | Discharge status: Skilled Nursing Facility | Payer: Self-pay | Source: Home / Self Care | Attending: Surgery

## 2016-09-25 DIAGNOSIS — K5732 Diverticulitis of large intestine without perforation or abscess without bleeding: Secondary | ICD-10-CM

## 2016-09-25 DIAGNOSIS — K5792 Diverticulitis of intestine, part unspecified, without perforation or abscess without bleeding: Secondary | ICD-10-CM

## 2016-09-25 HISTORY — PX: LAPAROTOMY: SHX154

## 2016-09-25 HISTORY — PX: COLOSTOMY: SHX63

## 2016-09-25 LAB — BASIC METABOLIC PANEL
ANION GAP: 3 — AB (ref 5–15)
Anion gap: 5 (ref 5–15)
BUN: 29 mg/dL — ABNORMAL HIGH (ref 6–20)
BUN: 30 mg/dL — AB (ref 6–20)
CALCIUM: 7.1 mg/dL — AB (ref 8.9–10.3)
CHLORIDE: 106 mmol/L (ref 101–111)
CO2: 33 mmol/L — AB (ref 22–32)
CO2: 27 mmol/L (ref 22–32)
CREATININE: 0.85 mg/dL (ref 0.44–1.00)
Calcium: 6.8 mg/dL — ABNORMAL LOW (ref 8.9–10.3)
Chloride: 109 mmol/L (ref 101–111)
Creatinine, Ser: 0.69 mg/dL (ref 0.44–1.00)
GFR calc Af Amer: >60 mL/min (ref >60)
GFR calc Af Amer: >60 mL/min (ref >60)
GFR calc non Af Amer: >60 mL/min (ref >60)
GLUCOSE: 98 mg/dL (ref 65–99)
GLUCOSE: 86 mg/dL (ref 65–99)
POTASSIUM: 4.8 mmol/L (ref 3.5–5.1)
Potassium: 4.1 mmol/L (ref 3.5–5.1)
SODIUM: 141 mmol/L (ref 135–145)
Sodium: 142 mmol/L (ref 135–145)

## 2016-09-25 LAB — GLUCOSE, CAPILLARY
GLUCOSE-CAPILLARY: 68 mg/dL (ref 65–99)
GLUCOSE-CAPILLARY: 70 mg/dL (ref 65–99)
GLUCOSE-CAPILLARY: 78 mg/dL (ref 65–99)
GLUCOSE-CAPILLARY: 78 mg/dL (ref 65–99)
GLUCOSE-CAPILLARY: 79 mg/dL (ref 65–99)
GLUCOSE-CAPILLARY: 95 mg/dL (ref 65–99)
Glucose-Capillary: 79 mg/dL (ref 65–99)
Glucose-Capillary: 149 mg/dL — ABNORMAL HIGH (ref 65–99)

## 2016-09-25 LAB — CBC
HEMATOCRIT: 33.3 % — AB (ref 35.0–47.0)
Hemoglobin: 11.1 g/dL — ABNORMAL LOW (ref 12.0–16.0)
MCH: 32.2 pg (ref 26.0–34.0)
MCHC: 33.4 g/dL (ref 32.0–36.0)
MCV: 96.4 fL (ref 80.0–100.0)
PLATELETS: 426 10*3/uL (ref 150–440)
RBC: 3.45 MIL/uL — ABNORMAL LOW (ref 3.80–5.20)
RDW: 15.3 % — AB (ref 11.5–14.5)
WBC: 16.6 10*3/uL — AB (ref 3.6–11.0)

## 2016-09-25 LAB — PHOSPHORUS: Phosphorus: 2.4 mg/dL — ABNORMAL LOW (ref 2.5–4.6)

## 2016-09-25 LAB — MAGNESIUM: Magnesium: 1.9 mg/dL (ref 1.7–2.4)

## 2016-09-25 SURGERY — LAPAROTOMY, EXPLORATORY
Anesthesia: General

## 2016-09-25 MED ORDER — ALBUMIN HUMAN 5 % IV SOLN: 25.0000 g | INTRAVENOUS | Status: DC

## 2016-09-25 MED ORDER — PHENYLEPHRINE HCL 10 MG/ML IJ SOLN
INTRAMUSCULAR | Status: DC | PRN
  Administered 2016-09-25 (×2): 200 ug via INTRAVENOUS
  Administered 2016-09-25: 300 ug via INTRAVENOUS
  Administered 2016-09-25: 200 ug via INTRAVENOUS

## 2016-09-25 MED ORDER — DEXTROSE 50 % IV SOLN
INTRAVENOUS | Status: AC
  Administered 2016-09-25: 25 mL via INTRAVENOUS
  Filled 2016-09-25: qty 50

## 2016-09-25 MED ORDER — ALBUMIN HUMAN 5 % IV SOLN
INTRAVENOUS | Status: DC | PRN
  Administered 2016-09-25 (×2): via INTRAVENOUS

## 2016-09-25 MED ORDER — ALBUMIN HUMAN 5 % IV SOLN
INTRAVENOUS | Status: AC
  Filled 2016-09-25: qty 500

## 2016-09-25 MED ORDER — SODIUM CHLORIDE 0.9 % IV BOLUS (SEPSIS)
1000.0000 mL | Freq: Once | INTRAVENOUS | Status: AC
  Administered 2016-09-25: 1000 mL via INTRAVENOUS

## 2016-09-25 MED ORDER — DEXTROSE 5 % IV SOLN
0.0000 ug/min | INTRAVENOUS | Status: DC
  Administered 2016-09-25: 300 ug/min via INTRAVENOUS
  Administered 2016-09-26 (×4): 275 ug/min via INTRAVENOUS
  Administered 2016-09-26: 250 ug/min via INTRAVENOUS
  Administered 2016-09-27: 242.933 ug/min via INTRAVENOUS
  Administered 2016-09-27: 175 ug/min via INTRAVENOUS
  Administered 2016-09-27: 245 ug/min via INTRAVENOUS
  Administered 2016-09-27: 91.9 ug/min via INTRAVENOUS
  Filled 2016-09-25 (×16): qty 4

## 2016-09-25 MED ORDER — FENTANYL CITRATE (PF) 100 MCG/2ML IJ SOLN: 25.0000 ug | INTRAMUSCULAR | Status: DC | PRN

## 2016-09-25 MED ORDER — ONDANSETRON HCL 4 MG/2ML IJ SOLN: 4.0000 mg | Freq: Once | INTRAMUSCULAR | Status: DC | PRN

## 2016-09-25 MED ORDER — PHENYLEPHRINE HCL 10 MG/ML IJ SOLN
0.0000 ug/min | INTRAVENOUS | Status: DC
  Administered 2016-09-25: 275 ug/min via INTRAVENOUS
  Administered 2016-09-25: 250 ug/min via INTRAVENOUS
  Administered 2016-09-25: 50 ug/min via INTRAVENOUS
  Filled 2016-09-25 (×3): qty 1

## 2016-09-25 MED ORDER — MIDAZOLAM HCL 2 MG/2ML IJ SOLN
INTRAMUSCULAR | Status: DC | PRN
  Administered 2016-09-25 (×2): 2 mg via INTRAVENOUS

## 2016-09-25 MED ORDER — SODIUM CHLORIDE 0.9 % IV BOLUS (SEPSIS)
500.0000 mL | Freq: Once | INTRAVENOUS | Status: AC
  Administered 2016-09-25: 500 mL via INTRAVENOUS

## 2016-09-25 MED ORDER — ROCURONIUM BROMIDE 100 MG/10ML IV SOLN
INTRAVENOUS | Status: DC | PRN
  Administered 2016-09-25 (×3): 50 mg via INTRAVENOUS

## 2016-09-25 MED ORDER — DEXMEDETOMIDINE HCL IN NACL 400 MCG/100ML IV SOLN
0.4000 ug/kg/h | INTRAVENOUS | Status: DC
  Administered 2016-09-25: 1 ug/kg/h via INTRAVENOUS
  Administered 2016-09-26 – 2016-09-27 (×2): 0.2 ug/kg/h via INTRAVENOUS
  Filled 2016-09-25 (×3): qty 100

## 2016-09-25 MED ORDER — FENTANYL CITRATE (PF) 100 MCG/2ML IJ SOLN
INTRAMUSCULAR | Status: DC | PRN
  Administered 2016-09-25 (×3): 100 ug via INTRAVENOUS
  Administered 2016-09-25: 150 ug via INTRAVENOUS

## 2016-09-25 MED ORDER — LACTATED RINGERS IV SOLN
INTRAVENOUS | Status: DC | PRN
  Administered 2016-09-25 (×2): via INTRAVENOUS

## 2016-09-25 MED ORDER — DEXTROSE 50 % IV SOLN
25.0000 mL | Freq: Once | INTRAVENOUS | Status: AC
  Administered 2016-09-25: 25 mL via INTRAVENOUS

## 2016-09-25 MED ORDER — SODIUM CHLORIDE 0.9 % IV SOLN
INTRAVENOUS | Status: DC | PRN
  Administered 2016-09-25: 08:00:00 via INTRAVENOUS

## 2016-09-25 SURGICAL SUPPLY — 57 items
BRR SKN FLT 1.75X1.25 2 PC (OSTOMY) ×3
CANISTER SUCT 1200ML W/VALVE (MISCELLANEOUS) ×1 IMPLANT
CANISTER SUCT 3000ML (MISCELLANEOUS) ×4 IMPLANT
CATH TRAY 16F METER LATEX (MISCELLANEOUS) ×1 IMPLANT
CHLORAPREP W/TINT 26ML (MISCELLANEOUS) ×3 IMPLANT
CLIP TI LARGE 6 (CLIP) ×1 IMPLANT
CLIP TI MEDIUM 6 (CLIP) ×1 IMPLANT
CLOSURE WOUND 1/2 X4 (GAUZE/BANDAGES/DRESSINGS) ×4
COVER CLAMP SIL LG PBX B (MISCELLANEOUS) ×6 IMPLANT
DRAIN PENROSE 5/8X18 LTX STRL (WOUND CARE) ×2 IMPLANT
DRAPE INCISE IOBAN 66X45 STRL (DRAPES) ×3 IMPLANT
DRAPE LAPAROTOMY 100X77 ABD (DRAPES) ×3 IMPLANT
DRAPE PERI LITHO V/GYN (MISCELLANEOUS) ×3 IMPLANT
ELECT CAUTERY NEEDLE TIP 1.0 (MISCELLANEOUS) ×3
ELECT REM PT RETURN 9FT ADLT (ELECTROSURGICAL) ×3
ELECTRODE CAUTERY NEDL TIP 1.0 (MISCELLANEOUS) ×1 IMPLANT
ELECTRODE REM PT RTRN 9FT ADLT (ELECTROSURGICAL) ×1 IMPLANT
GAUZE SPONGE 4X4 12PLY STRL (GAUZE/BANDAGES/DRESSINGS) ×3 IMPLANT
GLOVE BIO SURGEON STRL SZ7.5 (GLOVE) ×16 IMPLANT
GLOVE INDICATOR 8.0 STRL GRN (GLOVE) ×10 IMPLANT
GOWN STRL REUS W/ TWL LRG LVL3 (GOWN DISPOSABLE) ×6 IMPLANT
GOWN STRL REUS W/TWL LRG LVL3 (GOWN DISPOSABLE) ×9
KIT RM TURNOVER STRD PROC AR (KITS) ×3 IMPLANT
LABEL OR SOLS (LABEL) ×3 IMPLANT
LIGASURE MARYLAND LAP STAND (ELECTROSURGICAL) ×3 IMPLANT
NS IRRIG 1000ML POUR BTL (IV SOLUTION) ×5 IMPLANT
PACK BASIN MAJOR ARMC (MISCELLANEOUS) ×3 IMPLANT
PACK COLON CLEAN CLOSURE (MISCELLANEOUS) ×3 IMPLANT
PAD ABD DERMACEA PRESS 5X9 (GAUZE/BANDAGES/DRESSINGS) ×1 IMPLANT
POUCH DRAIN 1 3/4 SMALL GREEN (OSTOMY) ×6 IMPLANT
RETAINER VISCERA MED (MISCELLANEOUS) ×2 IMPLANT
SPONGE LAP 18X18 5 PK (GAUZE/BANDAGES/DRESSINGS) ×3 IMPLANT
STAPLER CUT CVD 40MM GREEN (STAPLE) ×2 IMPLANT
STAPLER SKIN PROX 35W (STAPLE) ×3 IMPLANT
STRIP CLOSURE SKIN 1/2X4 (GAUZE/BANDAGES/DRESSINGS) ×8 IMPLANT
SUT BOLSTER W/RETENTN TUBE (SUTURE) ×1 IMPLANT
SUT ETHILON 3-0 FS-10 30 BLK (SUTURE) ×3
SUT MAXON ABS #0 GS21 30IN (SUTURE) ×9 IMPLANT
SUT NYLON 2-0 (SUTURE) ×6 IMPLANT
SUT PDS AB 1 TP1 96 (SUTURE) ×5 IMPLANT
SUT PROLENE 2 TP 1 (SUTURE) ×3 IMPLANT
SUT SILK 2 0 (SUTURE) ×3
SUT SILK 2-0 18XBRD TIE 12 (SUTURE) ×1 IMPLANT
SUT SILK 3 0 (SUTURE) ×3
SUT SILK 3-0 (SUTURE) ×3
SUT SILK 3-0 18XBRD TIE 12 (SUTURE) ×1 IMPLANT
SUT SILK 3-0 SH-1 18XCR BRD (SUTURE) ×1
SUT SILK 4 0 (SUTURE) ×3
SUT SILK 4-0 18XBRD TIE 12 (SUTURE) ×1 IMPLANT
SUT TICRON 2-0 30IN 311381 (SUTURE) ×3 IMPLANT
SUT VIC AB 3-0 SH 27 (SUTURE) ×18
SUT VIC AB 3-0 SH 27X BRD (SUTURE) ×3 IMPLANT
SUT VIC AB 4-0 FS2 27 (SUTURE) ×8 IMPLANT
SUT VICRYL+ 3-0 144IN (SUTURE) ×3 IMPLANT
SUTURE EHLN 3-0 FS-10 30 BLK (SUTURE) ×1 IMPLANT
SUTURE SILK 3-0 SH-1 18XCR BRD (SUTURE) ×1 IMPLANT
WAFER FLANGE 1 3/4 SMALL GREEN (OSTOMY) ×6 IMPLANT

## 2016-09-25 NOTE — Progress Notes (Signed)
Discussed the case with the patient's sons. She has improved with her more stable vital signs. She appears to be more responsive. We plan to return her to surgery today to consider ostomy versus bowel anastomosis. They are in agreement with the planned procedure.

## 2016-09-25 NOTE — Anesthesia Preprocedure Evaluation (Addendum)
Anesthesia Evaluation  Patient identified by MRN, date of birth, ID band  Reviewed: Allergy & Precautions, NPO status , Patient's Chart, lab work & pertinent test results  History of Anesthesia Complications Negative for: history of anesthetic complications  Airway Mallampati: II  TM Distance: >3 FB Neck ROM: Full    Dental  (+) Poor Dentition, Missing, Partial Lower   Pulmonary neg sleep apnea, neg COPD, Current Smoker,    breath sounds clear to auscultation- rhonchi (-) wheezing      Cardiovascular Exercise Tolerance: Good (-) hypertension(-) CAD and (-) Past MI  Rhythm:Regular Rate:Normal - Systolic murmurs and - Diastolic murmurs    Neuro/Psych negative neurological ROS  negative psych ROS   GI/Hepatic negative GI ROS, Neg liver ROS,   Endo/Other  negative endocrine ROSneg diabetes  Renal/GU negative Renal ROS     Musculoskeletal  (+) Arthritis , Rheumatoid disorders,    Abdominal (+) + obese,   Peds  Hematology negative hematology ROS (+)   Anesthesia Other Findings Past Medical History: No date: Collagen vascular disease (HCC) 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   Reproductive/Obstetrics                             Anesthesia Physical  Anesthesia Plan  ASA: IV and emergent  Anesthesia Plan: General   Post-op Pain Management:    Induction: Intravenous  Airway Management Planned: Oral ETT  Additional Equipment:   Intra-op Plan:   Post-operative Plan: Post-operative intubation/ventilation  Informed Consent: I have reviewed the patients History and Physical, chart, labs and discussed the procedure including the risks, benefits and alternatives for the proposed anesthesia with the patient or authorized representative who has indicated his/her understanding and acceptance.   Dental advisory given  Plan Discussed with: CRNA and Anesthesiologist  Anesthesia Plan Comments:  (Patient remains intubated in ICU from previous surgery 09/21/16. Patient had bowel perforation.)      Anesthesia Quick Evaluation

## 2016-09-25 NOTE — Consult Note (Signed)
PULMONARY / CRITICAL CARE MEDICINE   Name: Ruth Gray MRN: [id number] DOB: 09-24-1946    ADMISSION DATE:  09/19/2016 CONSULTATION DATE: 09/21/16  REFERRING MD:  Dr. Rayna Sexton  CHIEF COMPLAINT: Post op Vent Management s/p Perforated bowel  HISTORY OF PRESENT ILLNESS:   Ruth Gray is a 70 yo female with past medical history significant for RA on immunosuppressive therapy. Patient presents on 10/29 to [facility] with persistent abdominal pain with intractable nausea and vomiting. CT of the abdomen was concerning for diverticulitis versus colitis.Marland Kitchen She was treated with oral antibiotics as an outpatient. She did not improve despite of antibiotic use. She was admitted with elevated WBC.  Patient was evaluated by gastroenterologist on 10/31 with increased abdominal pain and shortness of breath and the repeat CT  Scan demonstrated a massive amount of intra- abdominal free air consistent with a bowel perforation.  Patient underwent exploratory laprotomy s/p bowel perforation with intention to return to surgery likely 20-48 hours for re-exploration attempt.  Patient was sent to ICU intubated and mechanically ventillated with PCCM Consult on for vent management.   SUBJECTIVE Remains intubated,sedated Critically ill On vasopressors  SIGNIFICANT EVENTS 10/31 EXPLORATORY LAPAROTOMY (N/A) COLON RESECTION- Ascending and Sigmoid INSERTION CENTRAL LINE ADULT  11/2 remains intubated,sedated, plan for re-exploration 11/4  Allergies  Allergen Reactions  . Ciprofloxacin Swelling  . Tramadol Nausea And Vomiting  . Penicillins Swelling and Rash    Has patient had a PCN reaction causing immediate rash, facial/tongue/throat swelling, SOB or lightheadedness with hypotension: {no Has patient had a PCN reaction causing severe rash involving mucus membranes or skin necrosis: no Has patient had a PCN reaction that required hospitalization no Has patient had a PCN reaction occurring  within the last 10 years: no If all of the above answers are "NO", then may proceed with Cephalosporin use.     No current facility-administered medications on file prior to encounter.    Current Outpatient Prescriptions on File Prior to Encounter  Medication Sig  . Calcium Carb-Cholecalciferol (CALCIUM + D3 PO) Take by mouth.  . InFLIXimab (REMICADE IV) Inject into the vein once a week.  . Methotrexate, Anti-Rheumatic, (METHOTREXATE, PF, [place]) Inject into the skin.    REVIEW OF SYSTEMS:   Unable to obtain-critically ill    VITAL SIGNS: BP 122/72   Pulse (!) 108   Temp 98.4 F (36.9 C) (Oral)   Resp 15   Ht 5\' 7"  (1.702 m)   Wt 211 lb 13.8 oz (96.1 kg)   SpO2 95%   BMI 33.18 kg/m   HEMODYNAMICS: CVP:  [8 mmHg-19 mmHg] 9 mmHg  VENTILATOR SETTINGS: Vent Mode: PRVC FiO2 (%):  [28 %] 28 % Set Rate:  [15 bmp] 15 bmp Vt Set:  [450 mL] 450 mL PEEP:  [5 cmH20] 5 cmH20 Plateau Pressure:  [20 cmH20] 20 cmH20  INTAKE / OUTPUT: I/O last 3 completed shifts: In: 2062.7 [I.V.:1552.7; IV Piggyback:510] Out: 1335 [Urine:1160; Emesis/NG output:175]  - Physical Examination:   GENERAL:critically ill appearing, +resp distress HEAD: Normocephalic, atraumatic.  EYES: Pupils equal, round, reactive to light.  No scleral icterus.  MOUTH: Moist mucosal membrane. NECK: Supple. No thyromegaly. No nodules. No JVD. c collar in place PULMONARY: - -wheezes +rhonchi CARDIOVASCULAR: S1 and S2. Regular rate and rhythm. No murmurs, rubs, or gallops.  GASTROINTESTINAL: Soft, nontender, +distended. No masses. - bowel sounds. No hepatosplenomegaly.  MUSCULOSKELETAL: No swelling, clubbing, or edema.  NEUROLOGIC: GCS<8T SKIN:intact,warm,dry    LABS:  BMET  Recent  Labs Lab 09/23/16 1139 09/24/16 0745 09/25/16 0536  NA 140 140 142  K 4.5 4.3 4.1  CL 113* 107 106  CO2 24 32 33*  BUN 33* 30* 29*  CREATININE 0.83 0.67 0.69  GLUCOSE 98 106* 98    Electrolytes  Recent Labs Lab  09/23/16 1139 09/24/16 0745 09/25/16 0536  CALCIUM 6.6* 7.0* 7.1*  MG  --  1.8 1.9  PHOS  --  1.7* 2.4*    CBC  Recent Labs Lab 09/20/16 0508 09/21/16 1435 09/22/16 0406 09/24/16 0745  WBC 6.4  --  12.5* 9.9  HGB 11.8* 11.9* 11.7* 8.9*  HCT 34.9* 35.0* 34.7* 26.9*  PLT 229  --  269 282    Coag's No results for input(s): APTT, INR in the last 168 hours.  Sepsis Markers  Recent Labs Lab 09/22/16 1103  LATICACIDVEN 1.6    ABG  Recent Labs Lab 09/21/16 1840 09/22/16 1130 09/23/16 1100  PHART 7.27* 7.22* 7.30*  PCO2ART 39 46 53*  PO2ART 120* 69* 72*    Liver Enzymes  Recent Labs Lab 09/19/16 1028 09/20/16 0508  AST 31 25  ALT 19 18  ALKPHOS 83 67  BILITOT 0.7 0.7  ALBUMIN 3.7 3.0*    Cardiac Enzymes No results for input(s): TROPONINI, PROBNP in the last 168 hours.  Glucose  Recent Labs Lab 09/24/16 1140 09/24/16 1605 09/24/16 1929 09/25/16 0002 09/25/16 0322 09/25/16 0427  GLUCAP 106* 104* 93 78 70 95    -  STUDIES:  10/29 CT abdomen>>Diverticulosis of descending and proximal sigmoid colon is noted.Diffuse wall thickening of sigmoid colon is noted with surroundinginflammation most consistent with diverticulitis or colitis.The more proximal colon is dilated and air-filled suggesting ileusor possibly distal colonic obstruction. 10/31 CT abdomen>>Large amount of free intraperitoneal air, consistent with bowelperforation. Small amount of free fluid in pelvic cul-de-sac, but noabscess identified.   CULTURES: None   ANTIBIOTICS: 10/31 Meropenem>>  SIGNIFICANT EVENTS: 10/31 patient admitted to [facility] ICU status post exploratory laparotomy due to bowel perforation  LINES/TUBES: 10/31 Right IJ>>  DISCUSSION: 70 YO Female with perforated bowel and diverticulitis now s/p exploratory laparotomy on 10/31 with plans of re-exploration attemt on 11/4  ASSESSMENT / PLAN:  PULMONARY A: Acute respiratory failure P:   Continue full vent  support Fentanyl/ versed for sedation Routine ABG CXR as needed  CARDIOVASCULAR A:  No active isssues P:  Continuous telemetry Keep MAP goals>65  RENAL A:   Acute kidney injury P:   Strict I/o Trend chemistry Replace electrolytes per ICU protocol  GASTROINTESTINAL A:   Exploratory laparotomy -10/31, s/p perforated bowel P:   Keep NPO Gastroenterologist following the patient Plans for re-exploration 11/4 Famotidine for GIP Zofran for nausea Keep dressing/incision CDI Follow up gen surgery recs  HEMATOLOGIC A:   No active issues P:  Heparin for DVT prophylaxis Transfuse if Hgb<7  INFECTIOUS A:   Exploratory laparotomy s/p bowel perforation P:   Monitor fever curve Continue Meropenem for intraabdominal infections  ENDOCRINE A:   No active issues P:   Monitor BS intermittently with BMP  NEUROLOGIC A:   No active issues P:   RASS goal: 0   I have personally obtained a history, examined the patient, evaluated Pertinent laboratory and RadioGraphic/imaging results, and  formulated the assessment and plan   The Patient requires high complexity decision making for assessment and support, frequent evaluation and titration of therapies, application of advanced monitoring technologies and extensive interpretation of multiple databases. Critical Care Time devoted to  patient care services described in this note is 35 minutes.   Overall, patient is critically ill, prognosis is guarded.  Patient with Multiorgan failure and at high risk for cardiac arrest and death.  Family at bedside updated and notified  Sanav Remer Santiago Glad, M.D.  [facility]  Medical Director [facility] Medical Director [facility] Cardio-Pulmonary Department

## 2016-09-25 NOTE — Op Note (Signed)
09/19/2016 - 09/25/2016  10:51 AM  PATIENT:  Ruth Gray  70 y.o. female  PRE-OPERATIVE DIAGNOSIS:  perforation,needs surgery for colostomy vs bowel anastomosis  POST-OPERATIVE DIAGNOSIS:  same  PROCEDURE:  Procedure(s): EXPLORATORY LAPAROTOMY and right colon resection (N/A) COLOSTOMY (N/A)  SURGEON:  Surgeon(s) and Role:    * Tiney Rouge III, MD - Primary   ASSISTANTS: [place]   ANESTHESIA:   general  EBL:  Total I/O In: 2522.5 [I.V.:2022.5; IV Piggyback:500] Out: 265 [Urine:165; Blood:100]   DRAINS: none   LOCAL MEDICATIONS USED:  NONE   DISPOSITION OF SPECIMEN:  PATHOLOGY   DICTATION: .Dragon Dictation with the patient supine position and after induction appropriate general anesthesia the patient's previous incision was opened by removing the clips and removing the loop PDS suture. Abdominal contents were examined. There was a lot of ascitic fluid but no evidence of significant infection. There were no significant adhesions. The 4 bowel endpoints, the ileum the descending colon the descending colon and the rectum were all identified. The rectal stump had not been tagged originally so it was placed on slight tension and a 3-0 Prolene suture used to identify the distal stump. There were still significant amount of edema and bowel wall thickening. We elected therefore to pursue only ostomies and mucous fistula.  The terminal ileum was identified and cleaned for several centimeters. A ileostomy site was created right lower quadrant cruciate incision made in the fascia and the bowel elevated through the incision into the subcutaneous space and an ostomy created. The ostomy is matured with 3 and 4-0 Vicryl. Descending colon was cleaned and a section of the most edematous part distally was divided to provide a clean surface. The mesentery was taken down between clamps and tied with 2-0 Vicryl. The specimens passed off the table. An ostomy site was created in the right upper quadrant and  a mucous fistula developed in that area again opening the bowel and maturing the mucous fistula with 3 and 4-0 Vicryl. Attention was then turned to the distal descending colon which was mobilized to the splenic flexure passed through an ostomy site in the left mid abdomen again created a cruciate fit fashion. The abdomen was closed using loop PDS from each and after extensive irrigation. A visceral retractor was utilized. Penrose drain was placed in the bed of the incision and skin clips. The ostomy in the left lower quadrant was then matured using 3 and 4-0 Vicryl.  In summary there is a right lower quadrant ileostomy which function right upper quadrant mucous fistula attached to the remnant of the ascending colon and a left lower quadrant mucous fistula which is attached to the distal descending colon. The rectal stump is in the pelvis.  Sterile dressings and ostomy appliances were applied and the patient returned to recovery room in guarded condition. Sponge instrument needle count were correct 2 in the operating room.  PLAN OF CARE: Admit to inpatient   PATIENT DISPOSITION:  ICU - intubated and critically ill.   Tiney Rouge III, MD

## 2016-09-25 NOTE — Transfer of Care (Signed)
Immediate Anesthesia Transfer of Care Note  Patient: Ruth Gray  Procedure(s) Performed: Procedure(s): EXPLORATORY LAPAROTOMY and right colon resection (N/A) COLOSTOMY (N/A)  Patient Location: ICU  Anesthesia Type:General  Level of Consciousness: sedated and Patient remains intubated per anesthesia plan  Airway & Oxygen Therapy: Patient remains intubated per anesthesia plan and Patient placed on Ventilator (see vital sign flow sheet for setting)  Post-op Assessment: Report given to RN and Post -op Vital signs reviewed and stable  Post vital signs: Reviewed and stable  Last Vitals:  Vitals:   09/25/16 0732 09/25/16 0800  BP:  133/81  Pulse:  (!) 108  Resp:  15  Temp: 36.9 C     Last Pain:  Vitals:   09/25/16 0732  TempSrc: Oral  PainSc:       Patients Stated Pain Goal: 0 (09/19/16 2100)  Complications: No apparent anesthesia complications

## 2016-09-25 NOTE — Progress Notes (Signed)
eLink Physician-Brief Progress Note Patient Name: Ruth Gray DOB: [birth year] MRN: [id number]   Date of Service  09/25/2016  HPI/Events of Note  Hypotension - BP soft d/t sedation with Fentanyl IV infusion.  eICU Interventions  Will bolus with 0.9 NaCl 1 liter IV over 1 hour now.      Intervention Category Intermediate Interventions: Hypotension - evaluation and management  Sanaa Zilberman Eugene 09/25/2016, 4:32 PM

## 2016-09-25 NOTE — Progress Notes (Signed)
09/25/2016 ABG 1935 PH 7.37 PCO2 46 PO2 65 SO2 94.8 HCO3 26.6 on vent settings PRVC 450 15 rr 28% peep 5. Sample drawn from aline.  Results placed in patient's chart/ NP-Dana B aware of these results.

## 2016-09-25 NOTE — Anesthesia Postprocedure Evaluation (Signed)
Anesthesia Post Note  Patient: RORI GOAR  Procedure(s) Performed: Procedure(s) (LRB): EXPLORATORY LAPAROTOMY and right colon resection (N/A) COLOSTOMY (N/A)  Patient location during evaluation: ICU Anesthesia Type: General Level of consciousness: sedated and patient remains intubated per anesthesia plan Pain management: pain level controlled Vital Signs Assessment: post-procedure vital signs reviewed and stable Respiratory status: patient remains intubated per anesthesia plan Cardiovascular status: blood pressure returned to baseline Anesthetic complications: no    Last Vitals:  Vitals:   09/25/16 1830 09/25/16 1900  BP:    Pulse:    Resp:    Temp: 37.6 C 36.8 C    Last Pain:  Vitals:   09/25/16 1900  TempSrc: Oral  PainSc:                  [place]

## 2016-09-26 ENCOUNTER — Inpatient Hospital Stay: Payer: Medicare Other

## 2016-09-26 LAB — BLOOD GAS, ARTERIAL
ACID-BASE EXCESS: 6.3 mmol/L — AB (ref 0.0–2.0)
Bicarbonate: 30.6 mmol/L — ABNORMAL HIGH (ref 20.0–28.0)
FIO2: 0.6
LHR: 15 {breaths}/min
O2 SAT: 93.5 %
PATIENT TEMPERATURE: 37
PCO2 ART: 42 mmHg (ref 32.0–48.0)
PEEP: 5 cmH2O
VT: 450 mL
pH, Arterial: 7.47 — ABNORMAL HIGH (ref 7.350–7.450)
pO2, Arterial: 64 mmHg — ABNORMAL LOW (ref 83.0–108.0)

## 2016-09-26 LAB — BASIC METABOLIC PANEL
ANION GAP: 4 — AB (ref 5–15)
BUN: 33 mg/dL — ABNORMAL HIGH (ref 6–20)
CO2: 28 mmol/L (ref 22–32)
Calcium: 6.7 mg/dL — ABNORMAL LOW (ref 8.9–10.3)
Chloride: 107 mmol/L (ref 101–111)
Creatinine, Ser: 0.77 mg/dL (ref 0.44–1.00)
GFR calc Af Amer: >60 mL/min (ref >60)
GLUCOSE: 150 mg/dL — AB (ref 65–99)
POTASSIUM: 4.6 mmol/L (ref 3.5–5.1)
SODIUM: 139 mmol/L (ref 135–145)

## 2016-09-26 LAB — CBC
HCT: 29.3 % — ABNORMAL LOW (ref 35.0–47.0)
HEMOGLOBIN: 9.7 g/dL — AB (ref 12.0–16.0)
MCH: 31.4 pg (ref 26.0–34.0)
MCHC: 33.1 g/dL (ref 32.0–36.0)
MCV: 94.7 fL (ref 80.0–100.0)
Platelets: 413 10*3/uL (ref 150–440)
RBC: 3.09 MIL/uL — ABNORMAL LOW (ref 3.80–5.20)
RDW: 15.4 % — ABNORMAL HIGH (ref 11.5–14.5)
WBC: 17.3 10*3/uL — AB (ref 3.6–11.0)

## 2016-09-26 LAB — GLUCOSE, CAPILLARY
GLUCOSE-CAPILLARY: 117 mg/dL — AB (ref 65–99)
GLUCOSE-CAPILLARY: 103 mg/dL — AB (ref 65–99)
Glucose-Capillary: 100 mg/dL — ABNORMAL HIGH (ref 65–99)
Glucose-Capillary: 109 mg/dL — ABNORMAL HIGH (ref 65–99)
Glucose-Capillary: 110 mg/dL — ABNORMAL HIGH (ref 65–99)
Glucose-Capillary: 121 mg/dL — ABNORMAL HIGH (ref 65–99)

## 2016-09-26 NOTE — Progress Notes (Signed)
Subjective:   She remains intubated ventilated with pressor support but her vital signs are much more stable. Her heart rate is down in the 80s and her blood pressures in the mid 130s. She is making reasonable mildly urine. She remained sedated.  Vital signs in last 24 hours: Temp:  [98.3 F (36.8 C)-100.5 F (38.1 C)] 98.8 F (37.1 C) (11/05 1200) Pulse Rate:  [85-146] 86 (11/05 1500) Resp:  [13-21] 13 (11/05 1500) BP: (70-155)/(45-82) 146/78 (11/05 1500) SpO2:  [94 %-99 %] 96 % (11/05 1500) Arterial Line BP: (59-149)/(36-85) 149/80 (11/05 1500) FiO2 (%):  [28 %-38 %] 28 % (11/05 1207) Weight:  [98.8 kg (217 lb 13 oz)] 98.8 kg (217 lb 13 oz) (11/05 0321) Last BM Date: 09/25/16  Intake/Output from previous day: 11/04 0701 - 11/05 0700 In: 5907.9 [I.V.:4107.9; IV Piggyback:1800] Out: 916 [Urine:596; Emesis/NG output:175; Drains:45; Blood:100]  Exam:  Her abdomen looks good. Dressings were changed. Both mucous fistula appear a bit dusky but viable. The end ileostomy appears stable and nonfunctioning at the present time.  Lab Results:  CBC  Recent Labs  09/25/16 1951 09/26/16 0428  WBC 16.6* 17.3*  HGB 11.1* 9.7*  HCT 33.3* 29.3*  PLT 426 413   CMP     Component Value Date/Time   NA 139 09/26/2016 0428   K 4.6 09/26/2016 0428   CL 107 09/26/2016 0428   CO2 28 09/26/2016 0428   GLUCOSE 150 (H) 09/26/2016 0428   BUN 33 (H) 09/26/2016 0428   CREATININE 0.77 09/26/2016 0428   CALCIUM 6.7 (L) 09/26/2016 0428   PROT 6.4 (L) 09/20/2016 0508   ALBUMIN 3.0 (L) 09/20/2016 0508   AST 25 09/20/2016 0508   ALT 18 09/20/2016 0508   ALKPHOS 67 09/20/2016 0508   BILITOT 0.7 09/20/2016 0508   GFRNONAA >60 09/26/2016 0428   GFRAA >60 09/26/2016 0428   PT/INR No results for input(s): LABPROT, INR in the last 72 hours.  Studies/Results: Dg Chest Port 1 View  Result Date: 09/26/2016 CLINICAL DATA:  Acute onset of respiratory failure. Initial encounter. EXAM: PORTABLE CHEST 1  VIEW COMPARISON:  Chest radiograph performed 09/21/2016 FINDINGS: The patient's endotracheal tube is seen ending 2-3 cm above the carina. An enteric tube is noted extending below the diaphragm. A right IJ line is noted ending about the mid to distal SVC. The lungs are hypoexpanded. Vascular congestion is noted. A small left pleural effusion is suspected. Mildly increased interstitial markings raise concern for mild interstitial edema. No pneumothorax is seen. The cardiomediastinal silhouette is normal in size. No acute osseous abnormalities are identified. IMPRESSION: 1. Endotracheal tube seen ending 2-3 cm above the carina. 2. Lungs hypoexpanded. Vascular congestion noted. Small left pleural effusion suspected. Mildly increased interstitial markings raise concern for mild interstitial edema. Electronically Signed   By: Roanna Raider M.D.   On: 09/26/2016 03:25    Assessment/Plan: She seems to be doing reasonably well. I talk with her sons today. We'll continue to observe her wounds. We appreciate the care by the intensivist service.

## 2016-09-26 NOTE — Progress Notes (Signed)
Dr. Leatha Gilding at bedside to evaluate surgical wound, and speak with family.  No further orders at this time.

## 2016-09-26 NOTE — Consult Note (Signed)
PULMONARY / CRITICAL CARE MEDICINE   Name: Ruth Gray MRN: [id number] DOB: 1945/11/29    ADMISSION DATE:  09/19/2016 CONSULTATION DATE: 09/21/16  REFERRING MD:  Dr. Rayna Sexton  CHIEF COMPLAINT: Post op Vent Management s/p Perforated bowel  HISTORY OF PRESENT ILLNESS:   Ruth Gray is a 70 yo female with past medical history significant for RA on immunosuppressive therapy. Patient presents on 10/29 to [facility] with persistent abdominal pain with intractable nausea and vomiting. CT of the abdomen was concerning for diverticulitis versus colitis.Marland Kitchen She was treated with oral antibiotics as an outpatient. She did not improve despite of antibiotic use. She was admitted with elevated WBC.  Patient was evaluated by gastroenterologist on 10/31 with increased abdominal pain and shortness of breath and the repeat CT  Scan demonstrated a massive amount of intra- abdominal free air consistent with a bowel perforation.  Patient underwent exploratory laprotomy s/p bowel perforation with intention to return to surgery likely 20-48 hours for re-exploration attempt.  Patient was sent to ICU intubated and mechanically ventillated with PCCM Consult on for vent management.   SUBJECTIVE Remains intubated,sedated Critically ill On vasopressors Increased WOB last night worsening BP  SIGNIFICANT EVENTS 10/31 EXPLORATORY LAPAROTOMY (N/A) COLON RESECTION- Ascending and Sigmoid INSERTION CENTRAL LINE ADULT  11/2 remains intubated,sedated, plan for re-exploration 11/4 11/4 EXPLORATORY LAPAROTOMY and right colon resection (N/A) COLOSTOMY (N/A)  Allergies  Allergen Reactions  . Ciprofloxacin Swelling  . Tramadol Nausea And Vomiting  . Penicillins Swelling and Rash    Has patient had a PCN reaction causing immediate rash, facial/tongue/throat swelling, SOB or lightheadedness with hypotension: {no Has patient had a PCN reaction causing severe rash involving mucus membranes or skin  necrosis: no Has patient had a PCN reaction that required hospitalization no Has patient had a PCN reaction occurring within the last 10 years: no If all of the above answers are "NO", then may proceed with Cephalosporin use.     No current facility-administered medications on file prior to encounter.    Current Outpatient Prescriptions on File Prior to Encounter  Medication Sig  . Calcium Carb-Cholecalciferol (CALCIUM + D3 PO) Take by mouth.  . InFLIXimab (REMICADE IV) Inject into the vein once a week.  . Methotrexate, Anti-Rheumatic, (METHOTREXATE, PF, [place]) Inject into the skin.    REVIEW OF SYSTEMS:   Unable to obtain-critically ill    VITAL SIGNS: BP 125/71   Pulse 88   Temp 98.4 F (36.9 C) (Oral)   Resp 15   Ht 5\' 7"  (1.702 m)   Wt 217 lb 13 oz (98.8 kg)   SpO2 96%   BMI 34.11 kg/m   HEMODYNAMICS: CVP:  [7 mmHg-17 mmHg] 9 mmHg  VENTILATOR SETTINGS: Vent Mode: PRVC FiO2 (%):  [28 %-38 %] 28 % Set Rate:  [15 bmp] 15 bmp Vt Set:  [450 mL] 450 mL PEEP:  [5 cmH20] 5 cmH20 Plateau Pressure:  [17 cmH20] 17 cmH20  INTAKE / OUTPUT: I/O last 3 completed shifts: In: 5907.9 [I.V.:4107.9; IV Piggyback:1800] Out: 1066 [Urine:746; Emesis/NG output:175; Drains:45; Blood:100]  - Physical Examination:   GENERAL:critically ill appearing, +resp distress HEAD: Normocephalic, atraumatic.  EYES: Pupils equal, round, reactive to light.  No scleral icterus.  MOUTH: Moist mucosal membrane. NECK: Supple. No thyromegaly. No nodules. No JVD. c collar in place PULMONARY: - -wheezes +rhonchi CARDIOVASCULAR: S1 and S2. Regular rate and rhythm. No murmurs, rubs, or gallops.  GASTROINTESTINAL:  +distended. No masses. - bowel sounds. Bandages /osotmy in place.  MUSCULOSKELETAL: No swelling, clubbing, or edema.  NEUROLOGIC: GCS<8T SKIN:intact,warm,dry    LABS:  BMET  Recent Labs Lab 09/25/16 0536 09/25/16 1951 09/26/16 0428  NA 142 141 139  K 4.1 4.8 4.6  CL 106 109 107   CO2 33* 27 28  BUN 29* 30* 33*  CREATININE 0.69 0.85 0.77  GLUCOSE 98 86 150*    Electrolytes  Recent Labs Lab 09/24/16 0745 09/25/16 0536 09/25/16 1951 09/26/16 0428  CALCIUM 7.0* 7.1* 6.8* 6.7*  MG 1.8 1.9  --   --   PHOS 1.7* 2.4*  --   --     CBC  Recent Labs Lab 09/24/16 0745 09/25/16 1951 09/26/16 0428  WBC 9.9 16.6* 17.3*  HGB 8.9* 11.1* 9.7*  HCT 26.9* 33.3* 29.3*  PLT 282 426 413    Coag's No results for input(s): APTT, INR in the last 168 hours.  Sepsis Markers  Recent Labs Lab 09/22/16 1103  LATICACIDVEN 1.6    ABG  Recent Labs Lab 09/22/16 1130 09/23/16 1100 09/25/16 0929  PHART 7.22* 7.30* 7.47*  PCO2ART 46 53* 42  PO2ART 69* 72* 64*    Liver Enzymes  Recent Labs Lab 09/19/16 1028 09/20/16 0508  AST 31 25  ALT 19 18  ALKPHOS 83 67  BILITOT 0.7 0.7  ALBUMIN 3.7 3.0*    Cardiac Enzymes No results for input(s): TROPONINI, PROBNP in the last 168 hours.  Glucose  Recent Labs Lab 09/25/16 0738 09/25/16 1334 09/25/16 1714 09/25/16 1916 09/25/16 2331 09/26/16 0406  GLUCAP 79 79 68 78 149* 121*    -  STUDIES:  10/29 CT abdomen>>Diverticulosis of descending and proximal sigmoid colon is noted.Diffuse wall thickening of sigmoid colon is noted with surroundinginflammation most consistent with diverticulitis or colitis.The more proximal colon is dilated and air-filled suggesting ileusor possibly distal colonic obstruction. 10/31 CT abdomen>>Large amount of free intraperitoneal air, consistent with bowelperforation. Small amount of free fluid in pelvic cul-de-sac, but noabscess identified.   CULTURES: None   ANTIBIOTICS: 10/31 Meropenem>>  SIGNIFICANT EVENTS: 10/31 patient admitted to [facility] ICU status post exploratory laparotomy due to bowel perforation  LINES/TUBES: 10/31 Right IJ>>  DISCUSSION: 70 YO Female with perforated bowel and diverticulitis now s/p exploratory laparotomy on 10/31  11/4 re-exploration  and colostomy placed   ASSESSMENT / PLAN:  PULMONARY A: Acute respiratory failure P:   Continue full vent support Fentanyl/ versed for sedation Routine ABG CXR as needed  CARDIOVASCULAR A:  No active isssues P:  Continuous telemetry Keep MAP goals>65  RENAL A:   Acute kidney injury P:   Strict I/o Trend chemistry Replace electrolytes per ICU protocol  GASTROINTESTINAL A:   Exploratory laparotomy -10/31, s/p perforated bowel 11/4 re-exploration with colostomy P:   Keep NPO Famotidine for GIP Keep dressing/incision CDI Follow up gen surgery recs  HEMATOLOGIC A:   No active issues P:  Heparin for DVT prophylaxis Transfuse if Hgb<7  INFECTIOUS A:   Exploratory laparotomy s/p bowel perforation P:   Monitor fever curve Continue Meropenem for intraabdominal infections  ENDOCRINE A:   No active issues P:   Monitor BS intermittently with BMP  NEUROLOGIC A:   No active issues P:   RASS goal: 0   I have personally obtained a history, examined the patient, evaluated Pertinent laboratory and Radio[employer]/imaging results, and  formulated the assessment and plan   The Patient requires high complexity decision making for assessment and support, frequent evaluation and titration of therapies, application of advanced monitoring technologies and  extensive interpretation of multiple databases. Critical Care Time devoted to patient care services described in this note is 35 minutes.   Overall, patient is critically ill, prognosis is guarded.  Patient with Multiorgan failure and at high risk for cardiac arrest and death.  Family at bedside updated and notified  Mitch Arquette Santiago Glad, M.D.  [facility]  Medical Director [facility] Medical Director [facility] Cardio-Pulmonary Department

## 2016-09-26 NOTE — Progress Notes (Signed)
Pt will respond to pain.  Pt is now sinus rhythm on the cardiac monitor.  Precedex was started at the beginning of the shift to aid in pt's comfort and heart rate which was then in the 140's.  Neo was added at the beginning of the shift to aid with blood pressure.  Pt remained afebrile.  Bowel sounds were noted in the left lower quadrant.  Urine output was minimal.  NP notified and foley was irrigated.  More urine noted after that with some small blood clots mixed in.  Son remained at bedside.  Pt is now resting comfortably.

## 2016-09-27 ENCOUNTER — Encounter: Payer: Self-pay | Admitting: Surgery

## 2016-09-27 DIAGNOSIS — K56609 Unspecified intestinal obstruction, unspecified as to partial versus complete obstruction: Secondary | ICD-10-CM

## 2016-09-27 DIAGNOSIS — J9600 Acute respiratory failure, unspecified whether with hypoxia or hypercapnia: Secondary | ICD-10-CM

## 2016-09-27 DIAGNOSIS — K5720 Diverticulitis of large intestine with perforation and abscess without bleeding: Secondary | ICD-10-CM

## 2016-09-27 LAB — CBC WITH DIFFERENTIAL/PLATELET
BASOS PCT: 1 %
Basophils Absolute: 0.1 10*3/uL (ref 0–0.1)
EOS ABS: 0.2 10*3/uL (ref 0–0.7)
EOS PCT: 1 %
HCT: 30.1 % — ABNORMAL LOW (ref 35.0–47.0)
Hemoglobin: 10 g/dL — ABNORMAL LOW (ref 12.0–16.0)
LYMPHS ABS: 2.7 10*3/uL (ref 1.0–3.6)
Lymphocytes Relative: 17 %
MCH: 31.7 pg (ref 26.0–34.0)
MCHC: 33.1 g/dL (ref 32.0–36.0)
MCV: 95.8 fL (ref 80.0–100.0)
MONOS PCT: 12 %
Monocytes Absolute: 2 10*3/uL — ABNORMAL HIGH (ref 0.2–0.9)
Neutro Abs: 11.6 10*3/uL — ABNORMAL HIGH (ref 1.4–6.5)
Neutrophils Relative %: 69 %
PLATELETS: 320 10*3/uL (ref 150–440)
RBC: 3.14 MIL/uL — ABNORMAL LOW (ref 3.80–5.20)
RDW: 15.3 % — AB (ref 11.5–14.5)
WBC: 16.6 10*3/uL — AB (ref 3.6–11.0)

## 2016-09-27 LAB — BASIC METABOLIC PANEL
Anion gap: 4 — ABNORMAL LOW (ref 5–15)
BUN: 26 mg/dL — AB (ref 6–20)
CALCIUM: 6.9 mg/dL — AB (ref 8.9–10.3)
CO2: 28 mmol/L (ref 22–32)
CREATININE: 0.62 mg/dL (ref 0.44–1.00)
Chloride: 106 mmol/L (ref 101–111)
GFR calc Af Amer: >60 mL/min (ref >60)
GLUCOSE: 123 mg/dL — AB (ref 65–99)
Potassium: 4 mmol/L (ref 3.5–5.1)
SODIUM: 138 mmol/L (ref 135–145)

## 2016-09-27 LAB — PHOSPHORUS: Phosphorus: 2.3 mg/dL — ABNORMAL LOW (ref 2.5–4.6)

## 2016-09-27 LAB — GLUCOSE, CAPILLARY
GLUCOSE-CAPILLARY: 103 mg/dL — AB (ref 65–99)
GLUCOSE-CAPILLARY: 102 mg/dL — AB (ref 65–99)
Glucose-Capillary: 105 mg/dL — ABNORMAL HIGH (ref 65–99)
Glucose-Capillary: 110 mg/dL — ABNORMAL HIGH (ref 65–99)
Glucose-Capillary: 115 mg/dL — ABNORMAL HIGH (ref 65–99)
Glucose-Capillary: 93 mg/dL (ref 65–99)

## 2016-09-27 LAB — MAGNESIUM: Magnesium: 1.8 mg/dL (ref 1.7–2.4)

## 2016-09-27 MED ORDER — DEXTROSE 5 % IV SOLN
10.0000 mmol | Freq: Once | INTRAVENOUS | Status: AC
  Administered 2016-09-27: 10 mmol via INTRAVENOUS
  Filled 2016-09-27: qty 3.33

## 2016-09-27 MED ORDER — VITAL HIGH PROTEIN PO LIQD: 1000.0000 mL | ORAL | Status: DC

## 2016-09-27 MED ORDER — VITAL AF 1.2 CAL PO LIQD
1000.0000 mL | ORAL | Status: DC
  Administered 2016-09-27: 1000 mL

## 2016-09-27 MED ORDER — MAGNESIUM SULFATE 2 GM/50ML IV SOLN
2.0000 g | Freq: Once | INTRAVENOUS | Status: AC
  Administered 2016-09-27: 2 g via INTRAVENOUS
  Filled 2016-09-27: qty 50

## 2016-09-27 NOTE — Progress Notes (Signed)
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  She remains intubated ventilated with pressor support . She remains sedated with mild agitation  Vitals:   09/27/16 1700 09/27/16 1800  BP: (!) 147/82 104/66  Pulse: (!) 104 (!) 118  Resp: 18 17  Temp:      I/O last 3 completed shifts: In: 4769.3 [I.V.:4031.3; NG/GT:84.7; IV Piggyback:653.3] Out: 2250 [Urine:2170; Stool:80] No intake/output data recorded.  PE:  Gen: on vent, responds to pain, NG tube in nare  GI: mucous fistulas and ostomy pink patent, air and stool in ileostomy bag, midline incision clean and dry, no erythema but mild superficial necrosis along edge of skin incision due to decreased blood flow  CBC Latest Ref Rng & Units 09/27/2016 09/26/2016 09/25/2016  WBC 3.6 - 11.0 K/uL 16.6(H) 17.3(H) 16.6(H)  Hemoglobin 12.0 - 16.0 g/dL 10.0(L) 9.7(L) 11.1(L)  Hematocrit 35.0 - 47.0 % 30.1(L) 29.3(L) 33.3(L)  Platelets 150 - 440 K/uL 320 413 426   CMP Latest Ref Rng & Units 09/27/2016 09/26/2016 09/25/2016  Glucose 65 - 99 mg/dL 791(T) 056(P) 86  BUN 6 - 20 mg/dL 79(Y) 80(X) 65(V)  Creatinine 0.44 - 1.00 mg/dL 3.74 8.27 0.78  Sodium 135 - 145 mmol/L 138 139 141  Potassium 3.5 - 5.1 mmol/L 4.0 4.6 4.8  Chloride 101 - 111 mmol/L 106 107 109  CO2 22 - 32 mmol/L 28 28 27   Calcium 8.9 - 10.3 mg/dL 6.9(L) 6.7(L) 6.8(L)  Total Protein 6.5 - 8.1 g/dL - - -  Total Bilirubin 0.3 - 1.2 mg/dL - - -  Alkaline Phos 38 - 126 U/L - - -  AST 15 - 41 U/L - - -  ALT 14 - 54 U/L - - -     Assessment/Plan: 70 year old female with likely diverticular colonic obstruction and rupture POD# 6 and 2 from damage control laparotomy and then subsequent take back to the OR washout 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  She remains critically ill in the ICU. I  discussed with Dr. 78 of the critical care team. They're going to attempt to wean on the vent to extubate but he is unsure if we will achieve that goal today. So she is having good ileostomy function will have them advance tube feeds to assist in nutrition. She appears to be getting mildly better daily. I discussed with the sons that were at bedside her slow progress but continued improvement at this time.

## 2016-09-27 NOTE — Progress Notes (Signed)
Pharmacy Antibiotic Note  Ruth Gray is a 70 y.o. 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  Pharmacy has been consulted for meropenem dosing ane electrolyte monitoring.   Plan: 1. Meropenem: Patient is on day 6 of meropenem. Will continue meropenem 1 g iv q 8 hours for intra-abdominal infection  2. Electrolytes: Mg = 1.8; Phos = 2.3 Pt received magnesium 2 g iv once and sodium phos 10 mmol iv once and recheck in AM.   Height: 5\' 7"  (170.2 cm) Weight: 217 lb 13 oz (98.8 kg) IBW/kg (Calculated) : 61.6  Temp (24hrs), Avg:99.4 F (37.4 C), Min:98.3 F (36.8 C), Max:100.8 F (38.2 C)   Recent Labs Lab 09/22/16 0406 09/22/16 1103  09/24/16 0745 09/25/16 0536 09/25/16 1951 09/26/16 0428 09/27/16 0420  WBC 12.5*  --   --  9.9  --  16.6* 17.3* 16.6*  CREATININE 1.19*  --   < > 0.67 0.69 0.85 0.77 0.62  LATICACIDVEN  --  1.6  --   --   --   --   --   --   < > = values in this interval not displayed.  Estimated Creatinine Clearance: 80.2 mL/min (by C-G formula based on SCr of 0.62 mg/dL).    Allergies  Allergen Reactions  . Ciprofloxacin Swelling  . Tramadol Nausea And Vomiting  . Penicillins Swelling and Rash    Has patient had a PCN reaction causing immediate rash, facial/tongue/throat swelling, SOB or lightheadedness with hypotension: {no Has patient had a PCN reaction causing severe rash involving mucus membranes or skin necrosis: no Has patient had a PCN reaction that required hospitalization no Has patient had a PCN reaction occurring within the last 10 years: no If all of the above answers are "NO", then may proceed with Cephalosporin use.     Antimicrobials this admission: Aztreonam 10/29 >> 10/31 Metronidazole 10/29 >> 10/31 Meropenem 11/1 >>  Dose adjustments this admission:  Microbiology results: 10/31 MRSA PCR: negative  Thank you for allowing pharmacy to be a part of this  patient's care.   11/31, PharmD Pharmacy Resident 09/27/2016 11:31 AM

## 2016-09-27 NOTE — Progress Notes (Signed)
PULMONARY / CRITICAL CARE MEDICINE   Name: Ruth Gray MRN: [id number] DOB: November 14, 1946    ADMISSION DATE:  09/19/2016 CONSULTATION DATE: 09/21/16  REFERRING MD:  Dr. Rayna Sexton  CHIEF COMPLAINT: Post op Vent Management s/p Perforated bowel  Patient profile: Ruth Gray is a 70 yo female with past medical history significant for RA on immunosuppressive therapy. Patient presents on 10/29 to [facility] with persistent abdominal pain with intractable nausea and vomiting. CT of the abdomen was concerning for diverticulitis versus colitis.Marland Kitchen She was treated with oral antibiotics as an outpatient. She did not improve despite of antibiotic use. She was admitted with elevated WBC.  Patient was evaluated by gastroenterologist on 10/31 with increased abdominal pain and shortness of breath and the repeat CT  Scan demonstrated a massive amount of intra- abdominal free air consistent with a bowel perforation.  Patient underwent exploratory laprotomy s/p bowel perforation with intention to return to surgery likely 20-48 hours for re-exploration attempt.  Patient was sent to ICU intubated and mechanically ventillated with PCCM Consult on for vent management.   SUBJECTIVE:  Patient remains intubated and lightly sedated, requiring vasopressors  SIGNIFICANT EVENTS 10/31 EXPLORATORY LAPAROTOMY (N/A) COLON RESECTION- Ascending and Sigmoid 11/2 remains intubated,sedated, plan for re-exploration 11/4 11/4 EXPLORATORY LAPAROTOMY and right colon resection (N/A) COLOSTOMY (N/A)  Allergies  Allergen Reactions  . Ciprofloxacin Swelling  . Tramadol Nausea And Vomiting  . Penicillins Swelling and Rash    Has patient had a PCN reaction causing immediate rash, facial/tongue/throat swelling, SOB or lightheadedness with hypotension: {no Has patient had a PCN reaction causing severe rash involving mucus membranes or skin necrosis: no Has patient had a PCN reaction that required hospitalization  no Has patient had a PCN reaction occurring within the last 10 years: no If all of the above answers are "NO", then may proceed with Cephalosporin use.     No current facility-administered medications on file prior to encounter.    Current Outpatient Prescriptions on File Prior to Encounter  Medication Sig  . Calcium Carb-Cholecalciferol (CALCIUM + D3 PO) Take by mouth.  . InFLIXimab (REMICADE IV) Inject into the vein once a week.  . Methotrexate, Anti-Rheumatic, (METHOTREXATE, PF, [place]) Inject into the skin.    REVIEW OF SYSTEMS:   Unable to obtain-critically ill    VITAL SIGNS: BP 134/83   Pulse 81   Temp 99 F (37.2 C) (Oral)   Resp 14   Ht 5\' 7"  (1.702 m)   Wt 98.8 kg (217 lb 13 oz)   SpO2 98%   BMI 34.11 kg/m   HEMODYNAMICS: CVP:  [6 mmHg-17 mmHg] 10 mmHg  VENTILATOR SETTINGS: Vent Mode: PRVC FiO2 (%):  [28 %] 28 % Set Rate:  [15 bmp] 15 bmp Vt Set:  [450 mL] 450 mL PEEP:  [5 cmH20] 5 cmH20 Plateau Pressure:  [11 cmH20] 11 cmH20  INTAKE / OUTPUT: I/O last 3 completed shifts: In: 7276.6 [I.V.:5476.6; IV Piggyback:1800] Out: 1801 [Urine:1481; Emesis/NG output:175; Drains:45; Blood:100]  - Physical Examination:   GENERAL:critically ill appearing, +resp distress HEAD: Normocephalic, atraumatic.  EYES: Pupils equal, round, reactive to light.  No scleral icterus.  MOUTH: Moist mucosal membrane. NECK: Supple. No thyromegaly. No nodules. No JVD. c collar in place PULMONARY: diminished bibasilar, no wheezes, crackles, rhonchi noted CARDIOVASCULAR: S1 and S2. Regular rate and rhythm. No murmurs, rubs, or gallops.  GASTROINTESTINAL:  +distended. No masses. - bowel sounds. Dressing cdi /osotmy in place.  MUSCULOSKELETAL: No swelling, clubbing, or edema.  NEUROLOGIC: GCS<8T SKIN:intact,warm,dry    LABS:  BMET  Recent Labs Lab 09/25/16 0536 09/25/16 1951 09/26/16 0428  NA 142 141 139  K 4.1 4.8 4.6  CL 106 109 107  CO2 33* 27 28  BUN 29* 30* 33*   CREATININE 0.69 0.85 0.77  GLUCOSE 98 86 150*    Electrolytes  Recent Labs Lab 09/24/16 0745 09/25/16 0536 09/25/16 1951 09/26/16 0428  CALCIUM 7.0* 7.1* 6.8* 6.7*  MG 1.8 1.9  --   --   PHOS 1.7* 2.4*  --   --     CBC  Recent Labs Lab 09/24/16 0745 09/25/16 1951 09/26/16 0428  WBC 9.9 16.6* 17.3*  HGB 8.9* 11.1* 9.7*  HCT 26.9* 33.3* 29.3*  PLT 282 426 413    Coag's No results for input(s): APTT, INR in the last 168 hours.  Sepsis Markers  Recent Labs Lab 09/22/16 1103  LATICACIDVEN 1.6    ABG  Recent Labs Lab 09/22/16 1130 09/23/16 1100 09/25/16 0929  PHART 7.22* 7.30* 7.47*  PCO2ART 46 53* 42  PO2ART 69* 72* 64*    Liver Enzymes  Recent Labs Lab 09/20/16 0508  AST 25  ALT 18  ALKPHOS 67  BILITOT 0.7  ALBUMIN 3.0*    Cardiac Enzymes No results for input(s): TROPONINI, PROBNP in the last 168 hours.  Glucose  Recent Labs Lab 09/26/16 0406 09/26/16 0738 09/26/16 1134 09/26/16 1658 09/26/16 1932 09/26/16 2351  GLUCAP 121* 110* 117* 103* 109* 100*    -  STUDIES:  10/29 CT abdomen>>Diverticulosis of descending and proximal sigmoid colon is noted.Diffuse wall thickening of sigmoid colon is noted with surroundinginflammation most consistent with diverticulitis or colitis.The more proximal colon is dilated and air-filled suggesting ileusor possibly distal colonic obstruction. 10/31 CT abdomen>>Large amount of free intraperitoneal air, consistent with bowelperforation. Small amount of free fluid in pelvic cul-de-sac, but noabscess identified.   CULTURES: None   ANTIBIOTICS: 10/31 Meropenem>>  SIGNIFICANT EVENTS: 10/31 patient admitted to [facility] ICU status post exploratory laparotomy due to bowel perforation  LINES/TUBES: 10/31 Right IJ>>  DISCUSSION: 70 YO Female with perforated bowel and diverticulitis now s/p exploratory laparotomy on 10/31  11/4 re-exploration and colostomy placed   ASSESSMENT /  PLAN:  PULMONARY A: Acute respiratory failure P:   Continue full vent support Fentanyl/ versed for sedation Routine ABG CXR as needed  CARDIOVASCULAR A:  No active isssues P:  Continuous telemetry Keep MAP goals>65  RENAL A:   Acute kidney injury P:   Strict I/o Trend chemistry Replace electrolytes per ICU protocol  GASTROINTESTINAL A:   Exploratory laparotomy -10/31, s/p perforated bowel 11/4 re-exploration with colostomy P:   Keep NPO Famotidine for GIP Keep dressing/incision CDI Follow up gen surgery recs  HEMATOLOGIC A:   No active issues P:  Heparin for DVT prophylaxis Transfuse if Hgb<7  INFECTIOUS A:   Exploratory laparotomy s/p bowel perforation P:   Monitor fever curve Continue Meropenem for intraabdominal infections  ENDOCRINE A:   No active issues P:   Monitor BS intermittently with BMP  NEUROLOGIC A:   No active issues P:   RASS goal: 0 to -1  Bincy Varughese,AG-ACNP Pulmonary & Critical Care  STAFF NOTE: I, Dr. Stephanie Acre have personally reviewed patient's available data, including medical history, events of note, physical examination and test results as part of my evaluation. I have discussed with NP Varughese and other care providers such as pharmacist, RN and RRT.  In addition,  I personally evaluated patient and  elicited key findings of   S:  no acute events overnight, moderate pressors requirement, weaning sedation down today.   O:  GEN-NAD, on vent,  HEENT-no lesions, [place]/AT CVS-s1, s2, RRR, no murmurs LUNGS-dec BS, no rale, no ronchi ABD-ileostomy present, 2 drains, no erythema, no purulent drainage MSK-generalized edema   Recent Labs CBC Latest Ref Rng & Units 09/27/2016 09/26/2016 09/25/2016  WBC 3.6 - 11.0 K/uL 16.6(H) 17.3(H) 16.6(H)  Hemoglobin 12.0 - 16.0 g/dL 10.0(L) 9.7(L) 11.1(L)  Hematocrit 35.0 - 47.0 % 30.1(L) 29.3(L) 33.3(L)  Platelets 150 - 440 K/uL 320 413 426      Recent Labs BMP Latest Ref Rng  & Units 09/27/2016 09/26/2016 09/25/2016  Glucose 65 - 99 mg/dL 056(P) 794(I) 86  BUN 6 - 20 mg/dL 01(K) 55(V) 74(M)  Creatinine 0.44 - 1.00 mg/dL 2.70 7.86 7.54  Sodium 135 - 145 mmol/L 138 139 141  Potassium 3.5 - 5.1 mmol/L 4.0 4.6 4.8  Chloride 101 - 111 mmol/L 106 107 109  CO2 22 - 32 mmol/L 28 28 27   Calcium 8.9 - 10.3 mg/dL 6.9(L) 6.7(L) 6.8(L)       (The following images and results were reviewed by Dr. Dema Severin on 09/27/2016). Dg Chest Port 1 View  Result Date: 09/26/2016 CLINICAL DATA:  Acute onset of respiratory failure. Initial encounter. EXAM: PORTABLE CHEST 1 VIEW COMPARISON:  Chest radiograph performed 09/21/2016 FINDINGS: The patient's endotracheal tube is seen ending 2-3 cm above the carina. An enteric tube is noted extending below the diaphragm. A right IJ line is noted ending about the mid to distal SVC. The lungs are hypoexpanded. Vascular congestion is noted. A small left pleural effusion is suspected. Mildly increased interstitial markings raise concern for mild interstitial edema. No pneumothorax is seen. The cardiomediastinal silhouette is normal in size. No acute osseous abnormalities are identified. IMPRESSION: 1. Endotracheal tube seen ending 2-3 cm above the carina. 2. Lungs hypoexpanded. Vascular congestion noted. Small left pleural effusion suspected. Mildly increased interstitial markings raise concern for mild interstitial edema. Electronically Signed   By: Roanna Raider M.D.   On: 09/26/2016 03:25      A:70 YO Female with perforated bowel and diverticulitis now s/p exploratory laparotomy on 10/31  11/4 re-exploration and colostomy placed, and functioning.   Acute Hypoxic respiratory failure Acute renal failure - resolving Metabolic Encephalopathy Perforated colon, s/p ileostomy with drains Diverticulitis    P:   -continue mechanical ventilation, spontaneous breathing trial and awake trial today -We'll start trickle feeds a day -Continue current  antibiotics -Good urine output noted -Surgery following actively, appreciate their recommendations, now has a functioning ileostomy  .  Rest per NP/medical resident whose note is outlined above and that I agree with  The patient is critically ill with multiple organ systems failure and requires high complexity decision making for assessment and support, frequent evaluation and titration of therapies, application of advanced monitoring technologies and extensive interpretation of multiple databases.   Critical Care Time devoted to patient care services described in this note is  40 Minutes.   This time reflects time of care of this signee Dr Stephanie Acre.  This critical care time does not reflect procedure time, or teaching time or supervisory time of PA/NP/Med-student/Med Resident etc but could involve care discussion time.  Stephanie Acre, MD [facility] Pager [phone] (please enter 7-digits) On Call Pager - [phone] (please enter 7-digits)  Note: This note was prepared with Dragon dictation along with  smaller phrase technology. Any transcriptional errors that result from this process are unintentional.

## 2016-09-27 NOTE — Progress Notes (Signed)
Nutrition Follow-up  DOCUMENTATION CODES:   Not applicable  INTERVENTION:  Tube Feeding: RD consult received for initiation/management of TF. Discussed nutrition poc during ICU rounds and with MD Loflin. Recommend starting Vital 1.2 AF at rate of 20 ml/hr with goal rate of 60 ml/hr providing 1728 kcals, 105 g protein and 1166 mL of free water. MD Loflin agreeable to starting low and titrating as tolerated to goal rate. Recommend titrating by 10 mL q 8 hours until goal of 60 ml/hr. Continue to assess   NUTRITION DIAGNOSIS:   Inadequate oral intake related to acute illness as evidenced by NPO status.  Being addressed via TF  GOAL:   Provide needs based on ASPEN/SCCM guidelines   MONITOR:   Vent status, Labs, Weight trends, I & O's  REASON FOR ASSESSMENT:   Ventilator    ASSESSMENT:   Pt s/p ex lap 11/4 resulting in colon resection with ileostomy placement, 2 mucous fistulas (1 attached to ascending colon and 1 to distal descending colon) and 2 penrose drains.  +stool via ostomy. Negligible output via fistulas.   Patient is currently intubated on ventilator support. Pt on pressors support (phenylephrine) but being titrated down. Precedex and fentanyl for sedation  MV: 7.7 L/min Temp (24hrs), Avg:99.4 F (37.4 C), Min:98.3 F (36.8 C), Max:100.8 F (38.2 C)  Diet Order:  Diet NPO time specified  Skin:  Reviewed, no issues  Last BM:  11/6 +stool via ostomy  Height:   Ht Readings from Last 1 Encounters:  09/21/16 5\' 7"  (1.702 m)    Weight:   Wt Readings from Last 1 Encounters:  09/26/16 217 lb 13 oz (98.8 kg)    Ideal Body Weight:     BMI:  Body mass index is 34.11 kg/m.  Estimated Nutritional Needs:   Kcal:  1786 kcals using estimated dry wt of 88 kg  Protein:  >/= 123 g  Fluid:  >/= 2 L  EDUCATION NEEDS:   No education needs identified at this time  13/05/17 MS, RD, LDN [phone] Pager  [phone] Weekend/On-Call Pager

## 2016-09-27 NOTE — Progress Notes (Signed)
Notified MD Mungal that patient was requiring more frequent suctioning and lung sounds were more congested than this AM. Also notified MD Mungal that patient was perspiring more than this AM. No new orders, will continue to monitor patient.

## 2016-09-27 NOTE — Progress Notes (Signed)
During rounds Dr. Dema Severin stated to stop fentanyl to see how patient responds to no sedation. Will continue to monitor patient response.

## 2016-09-27 NOTE — Care Management Note (Signed)
Case Management Note  Patient Details  Name: JNAI SNELLGROVE MRN: [id number] Date of Birth: 1945/12/09  Subjective/Objective:                  Met with patient's son's Saralyn Pilar (lives with patient) and son Montine Circle (lives in [place]). Patient is from home where she was independent with mobility and able to drive. She took care of all her basic needs; she smokes per Saralyn Pilar. Her PCP is Dr. Adrian Prows with [facility]. She was able to obtain medications when needed. Per MD patient would benefit from LTAC which was presented to both sons [employer] and [facility]). I advised both son's to review both places. I've learned since meeting with son's that [facility] is out of network to patient's insurance.  [facility] will review benefit and let this RNCM know.  Action/Plan:   RNCM will continue to follow.   Expected Discharge Date:                  Expected Discharge Plan:     In-House Referral:     Discharge planning Services  CM Consult  Post Acute Care Choice:  Long Term Acute Care (LTAC) Choice offered to:  Adult Children  DME Arranged:    DME Agency:     HH Arranged:    HH Agency:     Status of Service:  In process, will continue to follow  If discussed at Long Length of Stay Meetings, dates discussed:    Additional Comments:  Marshell Garfinkel, RN 09/27/2016, 12:06 PM

## 2016-09-27 NOTE — Progress Notes (Signed)
Pt. Resting comfortably evaluated using CPOT, son at beside throughout night. During the night pt developed a fever of 100.47F, tylenol given, came down to 2F. Neo titrated from down to , pt continues to maintain a MAP greater than 65.

## 2016-09-27 NOTE — Care Management (Signed)
[facility] can accept patient but not while intubated- no benefit.

## 2016-09-28 DIAGNOSIS — R112 Nausea with vomiting, unspecified: Secondary | ICD-10-CM

## 2016-09-28 DIAGNOSIS — K5732 Diverticulitis of large intestine without perforation or abscess without bleeding: Secondary | ICD-10-CM

## 2016-09-28 LAB — BASIC METABOLIC PANEL
ANION GAP: 5 (ref 5–15)
BUN: 27 mg/dL — AB (ref 6–20)
CHLORIDE: 105 mmol/L (ref 101–111)
CO2: 30 mmol/L (ref 22–32)
Calcium: 7.1 mg/dL — ABNORMAL LOW (ref 8.9–10.3)
Creatinine, Ser: 0.64 mg/dL (ref 0.44–1.00)
GFR calc Af Amer: >60 mL/min (ref >60)
GFR calc non Af Amer: >60 mL/min (ref >60)
GLUCOSE: 93 mg/dL (ref 65–99)
POTASSIUM: 4 mmol/L (ref 3.5–5.1)
Sodium: 140 mmol/L (ref 135–145)

## 2016-09-28 LAB — BLOOD GAS, ARTERIAL
ACID-BASE EXCESS: 0.8 mmol/L (ref 0.0–2.0)
BICARBONATE: 26.6 mmol/L (ref 20.0–28.0)
FIO2: 28
LHR: 15 {breaths}/min
Mechanical Rate: 15
O2 SAT: 91.7 %
PATIENT TEMPERATURE: 37
PCO2 ART: 46 mmHg (ref 32.0–48.0)
PEEP/CPAP: 5 cmH2O
PO2 ART: 65 mmHg — AB (ref 83.0–108.0)
VT: 450 mL
pH, Arterial: 7.37 (ref 7.350–7.450)

## 2016-09-28 LAB — GLUCOSE, CAPILLARY
Glucose-Capillary: 91 mg/dL (ref 65–99)
Glucose-Capillary: 78 mg/dL (ref 65–99)
Glucose-Capillary: 76 mg/dL (ref 65–99)
Glucose-Capillary: 69 mg/dL (ref 65–99)
Glucose-Capillary: 73 mg/dL (ref 65–99)
Glucose-Capillary: 110 mg/dL — ABNORMAL HIGH (ref 65–99)
Glucose-Capillary: 60 mg/dL — ABNORMAL LOW (ref 65–99)

## 2016-09-28 LAB — MAGNESIUM: Magnesium: 2.1 mg/dL (ref 1.7–2.4)

## 2016-09-28 LAB — ALBUMIN: Albumin: 1.4 g/dL — ABNORMAL LOW (ref 3.5–5.0)

## 2016-09-28 LAB — SURGICAL PATHOLOGY

## 2016-09-28 LAB — PHOSPHORUS: Phosphorus: 2.8 mg/dL (ref 2.5–4.6)

## 2016-09-28 MED ORDER — DEXTROSE 50 % IV SOLN
INTRAVENOUS | Status: AC
  Administered 2016-09-28: 25 mL
  Filled 2016-09-28: qty 50

## 2016-09-28 MED ORDER — DEXTROSE IN LACTATED RINGERS 5 % IV SOLN
INTRAVENOUS | Status: DC
  Administered 2016-09-28: 50 mL/h via INTRAVENOUS
  Administered 2016-09-29 – 2016-10-03 (×6): via INTRAVENOUS

## 2016-09-28 MED ORDER — VITAL AF 1.2 CAL PO LIQD
1000.0000 mL | ORAL | Status: DC
  Administered 2016-09-28: 1000 mL

## 2016-09-28 NOTE — Progress Notes (Signed)
2300- Checked patient's BG. BG 60. Notified NP on floor, received new orders. Followed though with orders and implemented the hypoglycemia protocol. Will recheck BG in 15 mins.   2315- rechecked BG 110. Will continue to assess the patient. Patient resting in bed with vital signs within normal limits, no apparent distress.

## 2016-09-28 NOTE — Care Management (Signed)
Per progression rounds planned to extubate patient tomorrow. Message left for son Luisa Hart to call this RNCM about [facility] potential.

## 2016-09-28 NOTE — Progress Notes (Signed)
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  She remains intubated ventilated but much more alert today.  She is now weaned off the pressor support with MAPs in the 70s as well.  She denies any abdominal pain today.  She did have some vomiting with the tube feedings yesterday, so those have been stopped today.   Vitals:   09/28/16 0906 09/28/16 1000  BP:  109/83  Pulse: 97 98  Resp: 11 13  Temp:      I/O last 3 completed shifts: In: 3918.7 [I.V.:2830.7; NG/GT:234.7; IV Piggyback:853.3] Out: 1830 [Urine:1730; Stool:100] Total I/O In: 40.2 [I.V.:40.2] Out: 100 [Urine:100]  PE:  Gen: on vent, responds to pain, NG tube in nare  GI: mucous fistulas and ostomy pink patent, air and stool in ileostomy bag, midline incision clean and dry, no erythema but mild superficial necrosis along edge of skin incision due to decreased blood flow  CBC Latest Ref Rng & Units 09/27/2016 09/26/2016 09/25/2016  WBC 3.6 - 11.0 K/uL 16.6(H) 17.3(H) 16.6(H)  Hemoglobin 12.0 - 16.0 g/dL 10.0(L) 9.7(L) 11.1(L)  Hematocrit 35.0 - 47.0 % 30.1(L) 29.3(L) 33.3(L)  Platelets 150 - 440 K/uL 320 413 426   CMP Latest Ref Rng & Units 09/28/2016 09/27/2016 09/26/2016  Glucose 65 - 99 mg/dL 93 263(F) 354(T)  BUN 6 - 20 mg/dL 62(B) 63(S) 93(T)  Creatinine 0.44 - 1.00 mg/dL 3.42 8.76 8.11  Sodium 135 - 145 mmol/L 140 138 139  Potassium 3.5 - 5.1 mmol/L 4.0 4.0 4.6  Chloride 101 - 111 mmol/L 105 106 107  CO2 22 - 32 mmol/L 30 28 28   Calcium 8.9 - 10.3 mg/dL 7.1(L) 6.9(L) 6.7(L)  Total Protein 6.5 - 8.1 g/dL - - -  Total Bilirubin 0.3 - 1.2 mg/dL - - -  Alkaline Phos 38 - 126 U/L - - -  AST 15 - 41 U/L - - -  ALT 14 - 54 U/L - - -     Assessment/Plan: 70 year old female with 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  She remains critically ill in the ICU. I discussed with Dr. 78 of the critical care team. They're going to attempt to wean on the vent to extubate but he is unsure if we will achieve that goal today. I also discussed with the dietician as well.  If she is not to be extubated today would try trickle feeds again.

## 2016-09-28 NOTE — Progress Notes (Addendum)
Dr. Orvis Brill contacted to inform that the pt was continuing to vomit and tube feeds would not be restarted. Dr. Orvis Brill stated "I do not have time for this right now" and discussion was ended.      Dr. Orvis Brill asked if there was a protocol and if the Critical care team had been notified of the tubes feeds.  She was told by the nurse of this note that they had and were ok with the tube feeds being stopped.   ---Dr. Marguerita Merles, 09/28/2016  7:13pm

## 2016-09-28 NOTE — Progress Notes (Signed)
PULMONARY / CRITICAL CARE MEDICINE   Name: DANUTA HUSEMAN MRN: [id number] DOB: [birth year]    ADMISSION DATE:  09/19/2016 CONSULTATION DATE: 09/21/16  REFERRING MD:  Dr. Rayna Sexton  CHIEF COMPLAINT: Post op Vent Management s/p Perforated bowel  Patient profile: Ruth Gray is a 70 yo female with past medical history significant for RA on immunosuppressive therapy. Patient presents on 10/29 to [facility] with persistent abdominal pain with intractable nausea and vomiting. CT of the abdomen was concerning for diverticulitis versus colitis.Marland Kitchen She was treated with oral antibiotics as an outpatient. She did not improve despite of antibiotic use. She was admitted with elevated WBC.  Patient was evaluated by gastroenterologist on 10/31 with increased abdominal pain and shortness of breath and the repeat CT  Scan demonstrated a massive amount of intra- abdominal free air consistent with a bowel perforation.  Patient underwent exploratory laprotomy s/p bowel perforation with intention to return to surgery likely 20-48 hours for re-exploration attempt. Patient was sent to ICU intubated and mechanically ventilated PCCM consulted for vent management.   SUBJECTIVE:  Patient remains intubated and lightly sedated, requiring low dose vasopressors.  Pt had 1 episode of emesis during the night   SIGNIFICANT EVENTS 10/31 EXPLORATORY LAPAROTOMY (N/A) COLON RESECTION- Ascending and Sigmoid 11/2 Remains intubated,sedated, plan for re-exploration 11/4 11/4 EXPLORATORY LAPAROTOMY and right colon resection (N/A) COLOSTOMY (N/A)  REVIEW OF SYSTEMS:   Unable to obtain pt intubated  VITAL SIGNS: BP 115/66   Pulse 100   Temp 98 F (36.7 C) (Oral)   Resp 15   Ht 5\' 7"  (1.702 m)   Wt 227 lb 8.2 oz (103.2 kg)   SpO2 96%   BMI 35.63 kg/m   HEMODYNAMICS: CVP:  [5 mmHg-20 mmHg] 11 mmHg  VENTILATOR SETTINGS: Vent Mode: PRVC FiO2 (%):  [28 %-30 %] 30 % Set Rate:  [15 bmp] 15 bmp Vt Set:   [450 mL] 450 mL PEEP:  [5 cmH20] 5 cmH20 Pressure Support:  [10 cmH20] 10 cmH20  INTAKE / OUTPUT: I/O last 3 completed shifts: In: 4769.3 [I.V.:4031.3; NG/GT:84.7; IV Piggyback:653.3] Out: 2250 [Urine:2170; Stool:80]   Physical Examination:   GENERAL:critically ill appearing, well developed HEAD: Normocephalic, atraumatic.  EYES: Pupils equal, round, reactive to light.  No scleral icterus.  MOUTH: Moist mucosal membrane. NECK: Supple, No JVD PULMONARY: diminished bibasilar, no wheezes, crackles, rhonchi noted CARDIOVASCULAR: S1 and S2. Regular rate and rhythm. No murmurs, rubs, or gallops.  GASTROINTESTINAL:  +distended. No masses. - bowel sounds. Dressing cdi /osotmy in place.  MUSCULOSKELETAL: No swelling, clubbing, or edema.  NEUROLOGIC: GCS<8T, opens eyes spontaneously and nods head appropriately to yes or no questions  SKIN: midline abdominal incision  STUDIES:  10/29 CT abdomen>>Diverticulosis of descending and proximal sigmoid colon is noted.Diffuse wall thickening of sigmoid colon is noted with surroundinginflammation most consistent with diverticulitis or colitis.The more proximal colon is dilated and air-filled suggesting ileusor possibly distal colonic obstruction. 10/31 CT abdomen>>Large amount of free intraperitoneal air, consistent with bowelperforation. Small amount of free fluid in pelvic cul-de-sac, but noabscess identified.  CULTURES: None   ANTIBIOTICS: 10/31 Meropenem>>  SIGNIFICANT EVENTS: 10/31 patient admitted to [facility] ICU status post exploratory laparotomy due to bowel perforation and mechanically ventilated 11/4 EXPLORATORY LAPAROTOMY and right colon resection (N/A) COLOSTOMY (N/A)  LINES/TUBES: 10/31 Right IJ>>  DISCUSSION: 70 YO Female with perforated bowel and diverticulitis now s/p exploratory laparotomy on 10/31  11/4 re-exploration and colostomy placed   ASSESSMENT / PLAN: PULMONARY A: Acute respiratory  failure Mechanical  Ventilation P:   Continue full vent support SBT today 11/7 Fentanyl/ versed for sedation Routine ABG CXR as needed  CARDIOVASCULAR A:  Tachycardia-resolved P:  Continuous telemetry Neosynephrine gtt as needed to maintain map >65  RENAL A:   Acute kidney injury-resolved P:   Strict I/O Trend BMP's Replace electrolytes per ICU protocol  GASTROINTESTINAL A:   Exploratory laparotomy -10/31, s/p perforated bowel 11/4 re-exploration with colostomy Emesis secondary to tube feeds 11/7 P:   Discontinue tube feedings for now 11/7 OG tube LIS Famotidine for GIP Keep dressing/incision CDI Follow up gen surgery recs  HEMATOLOGIC A:   Anemia Hx: Rheumatoid arthritis P:  Trend CBC's Heparin for VTE prophylaxis Transfuse if Hgb<7 Monitor for s/sx of bleeding  INFECTIOUS A:   Exploratory laparotomy s/p bowel perforation P:   Trend WBC's and monitor fever curve Continue Meropenem for intraabdominal infections  ENDOCRINE A:   No active issues P:   Monitor BS intermittently with BMP  NEUROLOGIC A:   No active issues P:   RASS goal: 0 to -1 Fentanyl gtt to maintain RASS goal WUA daily Lights on during the day Frequent reorientation  Sonda Rumble, AGNP  Pulmonary/Critical Care Pager [phone] (please enter 7 digits) PCCM Consult Pager [phone] (please enter 7 digits)     STAFF NOTE: I, Dr. Stephanie Acre have personally reviewed patient's available data, including medical history, events of note, physical examination and test results as part of my evaluation. I have discussed with NP Karin Golden  and other care providers such as pharmacist, RN and RRT.    S: More awake, alert, following simple commands, sons at bedside. On low dose pressors, low-dose sedation, and pressure support ventilation. Last night and this morning did not tolerate trickle feeds, had episodes of emesis.  A:70 YO Female with perforated bowel and diverticulitis now s/p  exploratory laparotomy on 10/31  11/4 re-exploration and colostomy placed, and functioning.   Acute Hypoxic respiratory failure- inmproving Acute renal failure - resolving Metabolic Encephalopathy- improving Perforated colon, s/p ileostomy with drains Diverticulitis    P:  -continue mechanical ventilation, spontaneous breathing trial and awake trial today -Patient tolerating trickle feeds mild to moderately, had some episodes of emesis -Continue current antibiotics -Good urine output noted -Surgery following actively, appreciate their recommendations, now has a functioning ileostomy   .  Rest per NP/medical resident whose note is outlined above and that I agree with  The patient is critically ill with multiple organ systems failure and requires high complexity decision making for assessment and support, frequent evaluation and titration of therapies, application of advanced monitoring technologies and extensive interpretation of multiple databases.   Critical Care Time devoted to patient care services described in this note is  35 Minutes.   This time reflects time of care of this signee Dr Stephanie Acre.  This critical care time does not reflect procedure time, or teaching time or supervisory time of PA/NP/Med-student/Med Resident etc but could involve care discussion time.  Stephanie Acre, MD [facility] Pager [phone] (please enter 7-digits) On Call Pager - [phone] (please enter 7-digits)  Note: This note was prepared with Dragon dictation along with smaller phrase technology. Any transcriptional errors that result from this process are unintentional.

## 2016-09-28 NOTE — Progress Notes (Signed)
Tube feeds were re-started at 1100 at a rate of 20mL/hr. At this time, pt vomiting green colored emesis. Tube feeding turned off and Dr. Dema Severin notified. Dr. Dema Severin stated to attempt tube feeding again at 1500 this date. Will continue to monitor.

## 2016-09-28 NOTE — Progress Notes (Signed)
Tube feeds were not started back at 1500 as per Dr. Courtney Paris order due to pt vomiting again. Dr. Sung Amabile notified of same and stated pt would be started on maintenance IV fluids. Will continue to monitor.

## 2016-09-28 NOTE — Progress Notes (Addendum)
More alert than Monday night. Following commands.Sedation at 100 mcg. Neo. At 5 mcg. Nods head to questions. Vomited x 2 - treated with Zofran. Tube feedings stopped.NG to suction. Retrieved  280 ml of light green fluid from  N/G. NG had been auscultated in stomach in previous rounds.

## 2016-09-28 NOTE — Progress Notes (Signed)
Pharmacy Antibiotic Note  Ruth Gray is a 70 y.o. 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  Pharmacy has been consulted for meropenem dosing and electrolyte monitoring.   Plan: 1. Meropenem: Patient is on day 7 of meropenem. Will continue meropenem 1 g iv q 8 hours for intra-abdominal infection. Patient had exploratory laparotomy and colostomy placed 11/4.  2. Electrolytes: K = 4.0; Corrected ca = 9.2; Phos = 2.8; mag = 2.1. Within normal limits. Will follow and replace as needed.  Height: 5\' 7"  (170.2 cm) Weight: 227 lb 8.2 oz (103.2 kg) IBW/kg (Calculated) : 61.6  Temp (24hrs), Avg:98.3 F (36.8 C), Min:98 F (36.7 C), Max:98.5 F (36.9 C)   Recent Labs Lab 09/22/16 0406 09/22/16 1103  09/24/16 0745 09/25/16 0536 09/25/16 1951 09/26/16 0428 09/27/16 0420 09/28/16 0431  WBC 12.5*  --   --  9.9  --  16.6* 17.3* 16.6*  --   CREATININE 1.19*  --   < > 0.67 0.69 0.85 0.77 0.62 0.64  LATICACIDVEN  --  1.6  --   --   --   --   --   --   --   < > = values in this interval not displayed.  Estimated Creatinine Clearance: 81.9 mL/min (by C-G formula based on SCr of 0.64 mg/dL).    Allergies  Allergen Reactions  . Ciprofloxacin Swelling  . Tramadol Nausea And Vomiting  . Penicillins Swelling and Rash    Has patient had a PCN reaction causing immediate rash, facial/tongue/throat swelling, SOB or lightheadedness with hypotension: {no Has patient had a PCN reaction causing severe rash involving mucus membranes or skin necrosis: no Has patient had a PCN reaction that required hospitalization no Has patient had a PCN reaction occurring within the last 10 years: no If all of the above answers are "NO", then may proceed with Cephalosporin use.     Antimicrobials this admission: Aztreonam 10/29 >> 10/31 Metronidazole 10/29 >> 10/31 Meropenem 11/1 >>  Dose adjustments this  admission:  Microbiology results: 10/31 MRSA PCR: negative  Thank you for allowing pharmacy to be a part of this patient's care.  11/31, PharmD Pharmacy Resident 09/28/2016 12:11 PM

## 2016-09-28 NOTE — Progress Notes (Signed)
Nutrition Follow-up  DOCUMENTATION CODES:   Not applicable  INTERVENTION:  -Discussed nutritional poc during ICU rounds with MD Mungal and with MD Loflin; pt with vomiting during the night. MDs agreeable to restarting TF at low rate of 10 ml/hr if pt not extubated today. Discussed with MD Loflin concern regarding pt poor nutrition since admission (9 days) with poor po intake for several days prior to admission. Discussed possibility of TPN. MD does not want to start TPN at this time as MD is hopeful that pt will be extubated within 24-48 hours with subsequent diet advancement and therefore TPN would not be required for at least 5-7 days. Will continue to assess and make recommendations.   NUTRITION DIAGNOSIS:   Inadequate oral intake related to acute illness as evidenced by NPO status.  Continues  GOAL:   Provide needs based on ASPEN/SCCM guidelines  MONITOR:   Vent status, Labs, Weight trends, I & O's  REASON FOR ASSESSMENT:   Ventilator    ASSESSMENT:    Pt remains on vent support, hoping to extubate tomorrow per MD during ICU rounds. Pt currently off pressors  Vital AF 1.2 started yesterday at 20 ml/hr; during the night, pt vomited. TF stopped and then restarted at 10 ml/hr. Pt vomited again and TF placed on hold.   Pt with functioning ostomy, +stool via ileostomy. Minimal output from fistulas and drains. Abdomen soft, BS present  Labs and meds reviewed  Diet Order:  Diet NPO time specified  Skin:  Reviewed, no issues  Last BM:  11/6 +stool via ostomy  Height:   Ht Readings from Last 1 Encounters:  09/21/16 5\' 7"  (1.702 m)    Weight:   Wt Readings from Last 1 Encounters:  09/28/16 227 lb 8.2 oz (103.2 kg)    Filed Weights   09/25/16 0338 09/26/16 0321 09/28/16 0547  Weight: 211 lb 13.8 oz (96.1 kg) 217 lb 13 oz (98.8 kg) 227 lb 8.2 oz (103.2 kg)    BMI:  Body mass index is 35.63 kg/m.  Estimated Nutritional Needs:   Kcal:  1786 kcals  Protein:   >/= 123 g  Fluid:  >/= 2 L  EDUCATION NEEDS:   No education needs identified at this time  13/07/17 MS, RD, LDN [phone] Pager  [phone] Weekend/On-Call Pager

## 2016-09-29 LAB — BASIC METABOLIC PANEL
ANION GAP: 3 — AB (ref 5–15)
BUN: 29 mg/dL — ABNORMAL HIGH (ref 6–20)
CHLORIDE: 107 mmol/L (ref 101–111)
CO2: 32 mmol/L (ref 22–32)
Calcium: 7.1 mg/dL — ABNORMAL LOW (ref 8.9–10.3)
Creatinine, Ser: 0.62 mg/dL (ref 0.44–1.00)
GFR calc non Af Amer: >60 mL/min (ref >60)
Glucose, Bld: 109 mg/dL — ABNORMAL HIGH (ref 65–99)
POTASSIUM: 3.8 mmol/L (ref 3.5–5.1)
SODIUM: 142 mmol/L (ref 135–145)

## 2016-09-29 LAB — CBC
HEMATOCRIT: 24.6 % — AB (ref 35.0–47.0)
Hemoglobin: 8.2 g/dL — ABNORMAL LOW (ref 12.0–16.0)
MCH: 31.5 pg (ref 26.0–34.0)
MCHC: 33.2 g/dL (ref 32.0–36.0)
MCV: 94.9 fL (ref 80.0–100.0)
PLATELETS: 290 10*3/uL (ref 150–440)
RBC: 2.59 MIL/uL — ABNORMAL LOW (ref 3.80–5.20)
RDW: 15.5 % — AB (ref 11.5–14.5)
WBC: 16.5 10*3/uL — ABNORMAL HIGH (ref 3.6–11.0)

## 2016-09-29 LAB — GLUCOSE, CAPILLARY
GLUCOSE-CAPILLARY: 103 mg/dL — AB (ref 65–99)
GLUCOSE-CAPILLARY: 79 mg/dL (ref 65–99)
GLUCOSE-CAPILLARY: 99 mg/dL (ref 65–99)
Glucose-Capillary: 73 mg/dL (ref 65–99)
Glucose-Capillary: 78 mg/dL (ref 65–99)
Glucose-Capillary: 88 mg/dL (ref 65–99)
Glucose-Capillary: 92 mg/dL (ref 65–99)
Glucose-Capillary: 95 mg/dL (ref 65–99)

## 2016-09-29 LAB — PHOSPHORUS: Phosphorus: 2.6 mg/dL (ref 2.5–4.6)

## 2016-09-29 MED ORDER — ORAL CARE MOUTH RINSE
15.0000 mL | Freq: Two times a day (BID) | OROMUCOSAL | Status: DC
Start: 2016-09-29 — End: 2016-10-14
  Administered 2016-09-29 – 2016-10-13 (×22): 15 mL via OROMUCOSAL

## 2016-09-29 MED ORDER — KETOROLAC TROMETHAMINE 15 MG/ML IJ SOLN
15.0000 mg | Freq: Four times a day (QID) | INTRAMUSCULAR | Status: AC
  Administered 2016-09-29 – 2016-10-04 (×17): 15 mg via INTRAVENOUS
  Filled 2016-09-29 (×16): qty 1

## 2016-09-29 MED ORDER — ACETAMINOPHEN 160 MG/5ML PO SOLN
650.0000 mg | Freq: Four times a day (QID) | ORAL | Status: DC
  Administered 2016-09-29 – 2016-09-30 (×6): 650 mg
  Filled 2016-09-29 (×9): qty 20.3

## 2016-09-29 MED ORDER — ENOXAPARIN SODIUM 40 MG/0.4ML ~~LOC~~ SOLN
40.0000 mg | SUBCUTANEOUS | Status: DC
  Administered 2016-09-29 – 2016-10-13 (×14): 40 mg via SUBCUTANEOUS
  Filled 2016-09-29 (×15): qty 0.4

## 2016-09-29 NOTE — Progress Notes (Signed)
  Patient was extubated around 05:50 am without any complications. Satting well on 3l of nasal canula. Good strong cough present. Will continue to monitor.   Mahayla Haddaway,AG-ACNP Pulmonary & Critical Care

## 2016-09-29 NOTE — Evaluation (Signed)
Clinical/Bedside Swallow Evaluation Patient Details  Name: Ruth Gray MRN: [id number] Date of Birth: May 13, 1946  Today's Date: 09/29/2016 Time: SLP Start Time (ACUTE ONLY): 1030 SLP Stop Time (ACUTE ONLY): 1130 SLP Time Calculation (min) (ACUTE ONLY): 60 min  Past Medical History:  Past Medical History:  Diagnosis Date  . Collagen vascular disease (HCC)   . Rheumatoid arteritis    Past Surgical History:  Past Surgical History:  Procedure Laterality Date  . ABDOMINAL HYSTERECTOMY    . CENTRAL VENOUS CATHETER INSERTION  09/21/2016   Procedure: INSERTION CENTRAL LINE ADULT;  Surgeon: Tiney Rouge III, MD;  Location: [facility] ORS;  Service: General;;  . COLON RESECTION  09/21/2016   Procedure: COLON RESECTION- Ascending and Sigmoid;  Surgeon: Tiney Rouge III, MD;  Location: [facility] ORS;  Service: General;;  . COLOSTOMY N/A 09/25/2016   Procedure: COLOSTOMY;  Surgeon: Tiney Rouge III, MD;  Location: [facility] ORS;  Service: General;  Laterality: N/A;  . FOOT SURGERY    . LAPAROTOMY N/A 09/21/2016   Procedure: EXPLORATORY LAPAROTOMY;  Surgeon: Tiney Rouge III, MD;  Location: [facility] ORS;  Service: General;  Laterality: N/A;  . LAPAROTOMY N/A 09/25/2016   Procedure: EXPLORATORY LAPAROTOMY and right colon resection;  Surgeon: Tiney Rouge III, MD;  Location: [facility] ORS;  Service: General;  Laterality: N/A;  . REPLACEMENT TOTAL KNEE Left    HPI:  Pt is a 70 yo female with past medical history significant for RA on immunosuppressive therapy. Patient presents on 10/29 to [facility] with persistent abdominal pain with intractable nausea and vomiting. CT of the abdomen was concerning for diverticulitis versus colitis.Marland Kitchen She was treated with oral antibiotics as an outpatient. She did not improve despite of antibiotic use. She was admitted with elevated WBC.  Patient was evaluated by gastroenterologist on 10/31 with increased abdominal pain and shortness of breath and the repeat CT  Scan demonstrated a  massive amount of intra- abdominal free air consistent with a bowel perforation.  Patient underwent exploratory laprotomy s/p bowel perforation with intention to return to surgery likely 20-48 hours for re-exploration attempt. Patient was sent to ICU intubated and mechanically ventilated PCCM consulted for vent management. Patient was successfully extubated at approximately 0545. Patient tolerated the extubated without complications. Minimal thick white secretions with oral suction post extubation. Placed patient on 3 liters nasal cannula.   Assessment / Plan / Recommendation Clinical Impression  Pt appeared to adequatley tolerate po trials of ice chips, thin liquids, and minimal trials of puree with no immediate overt s/s of aspiration. Pt appears at a reduced-minimal risk for aspiration following aspiration precautions, due to overall generalized weakness, NG tube in place for suction and recent history of N/V. Pt and son were educated regarding aspiration precautions and the importance in following aspiration precautions. Pt appeared to swallow at her baseline. Pt's voice was clear and increased in volume as po trials were given. Pt was given minimal (2) trials of puree due to NG tube for suction being in place. Pt was not given po trials of solids due to NG tube for suction being in place. Pt requested Zofran prior to Bedside Swallow Evaluation (BSE), as she had feelings of nausea. Recommend a Clear Liquid diet following aspiration precautions while NG is in place. When Pt is able to have NG remove, would recommend upgrade of diet to Mechanical Soft with thin liquids. Recommend Ice Chips as Pt requests following oral care. ST services will follow up to monitor diet tolerance and trials  to upgrade diet.      Aspiration Risk   (Reduced-Mild Risk)    Diet Recommendation Ice chips PRN after oral care;Thin liquid   Liquid Administration via: Cup;No straw Medication Administration: Via alternative  means Supervision: Staff to assist with self feeding;Full supervision/cueing for compensatory strategies Compensations: Minimize environmental distractions;Slow rate;Small sips/bites;Follow solids with liquid Postural Changes: Seated upright at 90 degrees;Remain upright for at least 30 minutes after po intake    Other  Recommendations Recommended Consults:  (OT Consult) Oral Care Recommendations: Oral care prior to ice chip/H20;Oral care BID;Staff/trained caregiver to provide oral care   Follow up Recommendations  (TBD)      Frequency and Duration min 3x week  2 weeks       Prognosis Prognosis for Safe Diet Advancement: Good      Swallow Study   General Date of Onset: 09/19/16 HPI: Pt is a 70 yo female with past medical history significant for RA on immunosuppressive therapy. Patient presents on 10/29 to [facility] with persistent abdominal pain with intractable nausea and vomiting. CT of the abdomen was concerning for diverticulitis versus colitis.Marland Kitchen She was treated with oral antibiotics as an outpatient. She did not improve despite of antibiotic use. She was admitted with elevated WBC.  Patient was evaluated by gastroenterologist on 10/31 with increased abdominal pain and shortness of breath and the repeat CT  Scan demonstrated a massive amount of intra- abdominal free air consistent with a bowel perforation.  Patient underwent exploratory laprotomy s/p bowel perforation with intention to return to surgery likely 20-48 hours for re-exploration attempt. Patient was sent to ICU intubated and mechanically ventilated PCCM consulted for vent management. Patient was successfully extubated at approximately 0545. Patient tolerated the extubated without complications. Minimal thick white secretions with oral suction post extubation. Placed patient on 3 liters nasal cannula. Type of Study: Bedside Swallow Evaluation Previous Swallow Assessment: None indicated Diet Prior to this  Study: Regular;Thin liquids Temperature Spikes Noted: No (WBC 16.5) Respiratory Status: Nasal cannula (1-2 L) History of Recent Intubation: Yes Length of Intubations (days): 7 days Date extubated: 09/29/16 Behavior/Cognition: Alert;Cooperative;Pleasant mood Oral Cavity Assessment: Within Functional Limits Oral Care Completed by SLP: Recent completion by staff Oral Cavity - Dentition: Adequate natural dentition Vision: Functional for self-feeding Self-Feeding Abilities: Able to feed self;Needs assist;Needs set up;Total assist Patient Positioning: Upright in bed Baseline Vocal Quality: Low vocal intensity;Hoarse Volitional Cough: Strong Volitional Swallow: Able to elicit    Oral/Motor/Sensory Function Overall Oral Motor/Sensory Function: Within functional limits   Ice Chips Ice chips: Within functional limits Presentation: Spoon (4 trials)   Thin Liquid Thin Liquid: Within functional limits Presentation: Cup (5 trials)    Nectar Thick Nectar Thick Liquid: Not tested   Honey Thick Honey Thick Liquid: Not tested   Puree Puree: Within functional limits Presentation: Spoon (2 trials)   Solid   GO   Solid: Not tested        Altamese Dilling, B.A. Clinical Graduate Student 09/29/2016,3:20 PM   This information has been reviewed and agreed upon by this supervising clinician.  Jerilynn Som, MS, CCC-SLP

## 2016-09-29 NOTE — Progress Notes (Signed)
Patient was successfully extubated at approximately 0545. Patient tolerated the extubated without complications. Son at bedside for extubation. Minimal thick white secretions with oral suction post extubation. Placed patient on 3 liters nasal cannula. Patient resting in bed, vital signs within normal limits. Son at bedside with patient. Will continue to assess and monitor patient.

## 2016-09-29 NOTE — Progress Notes (Signed)
Pt. Extubated to a 3l [place]. Pt doing well NP, RN and son at bedside. SPO2 99%, RR 16, HR 92. Will cont. To monitor pt.

## 2016-09-29 NOTE — Care Management (Signed)
[facility] cannot accept patient as patient is has improved and appears to be more SNF appropriate. Albin Felling with [facility] has discussed this with patient's son. RNCM will continue to follow PT evaluation would help determination of need when it is appropriate.

## 2016-09-29 NOTE — Care Management (Signed)
Patient has been extubated. I met with patient's son Montine Circle at patient's bedside and he asked that we start authorization process for LT[facility] at [facility]. I have notified Dr. Stevenson Clinch and  Nevin with [facility]. [facility] will start authorization process.

## 2016-09-29 NOTE — Progress Notes (Signed)
Pharmacy Antibiotic Note  Ruth Gray is a 70 y.o. 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  Pharmacy has been consulted for meropenem dosing and electrolyte monitoring.   Plan: 1. Meropenem: Patient is on day 8 of meropenem. Will continue meropenem 1 g iv q 8 hours for intra-abdominal infection. Stop date has been entered for 7 days after last surgery which was 11/4.  2. Electrolytes: Within normal limits. Will follow and replace as needed.  Height: 5\' 7"  (170.2 cm) Weight: 227 lb 1.2 oz (103 kg) IBW/kg (Calculated) : 61.6  Temp (24hrs), Avg:98.2 F (36.8 C), Min:97.7 F (36.5 C), Max:98.7 F (37.1 C)   Recent Labs Lab 09/24/16 0745  09/25/16 1951 09/26/16 0428 09/27/16 0420 09/28/16 0431 09/29/16 0312 09/29/16 0840  WBC 9.9  --  16.6* 17.3* 16.6*  --   --  16.5*  CREATININE 0.67  < > 0.85 0.77 0.62 0.64 0.62  --   < > = values in this interval not displayed.  Estimated Creatinine Clearance: 81.9 mL/min (by C-G formula based on SCr of 0.62 mg/dL).    Allergies  Allergen Reactions  . Ciprofloxacin Swelling  . Tramadol Nausea And Vomiting  . Penicillins Swelling and Rash    Has patient had a PCN reaction causing immediate rash, facial/tongue/throat swelling, SOB or lightheadedness with hypotension: {no Has patient had a PCN reaction causing severe rash involving mucus membranes or skin necrosis: no Has patient had a PCN reaction that required hospitalization no Has patient had a PCN reaction occurring within the last 10 years: no If all of the above answers are "NO", then may proceed with Cephalosporin use.     Antimicrobials this admission: Aztreonam 10/29 >> 10/31 Metronidazole 10/29 >> 10/31 Meropenem 11/1 >>  Dose adjustments this admission:  Microbiology results: 10/31 MRSA PCR: negative  Thank you for allowing pharmacy to be a part of this patient's care.  11/31, PharmD Pharmacy Resident 09/29/2016 3:49 PM

## 2016-09-29 NOTE — Discharge Summary (Signed)
Physician Transfer/Discharge Summary  Patient ID: Ruth Gray MRN: [id number] DOB/AGE: 1946-09-10 70 y.o.  Admit date: 09/19/2016 Discharge date: 09/29/2016    Discharge Diagnoses:        Acute respiratory failure s/p exploratory laparotomy       Acute Kidney Injury-resolved       Exploratory laparotomy 10/31 s/p perforated bowel       Diverticulitis-resolved       Reexploration with colostomy 11/4       Rheumatoid arthritis       Collagen Vascular Disease       Hypotension secondary to perforated bowel-resolved       Tachycardia-resolved       Emesis secondary to tube feeds-resolved       Anemia       Metabolic Encephalopathy-improving                                                                                       Transfer/DISCHARGE SUMMARY   Ruth Gray is a 70 y.o. y/o female with a PMH significant for RA on immunosuppressive therapy and Collagen vascular disease. Patient presents to [facility] ER on 10/29 with c/o persistent abdominal pain with intractable nausea and vomiting. She was seen by her PCP on 09/16/16 and diagnosed with Diverticulitis based on CT Abd and Pelvis results and prescribed Bactrim and Cipro, however symptoms persisted prompting visit to ER. According to ER notes she was unable to keep antibiotics down due to persistent pain, nausea, and vomiting.  Her description of the emesis was non bloody and non bilious. Upon arrival to the ER she was admitted with leukocytosis.  Patient was evaluated by gastroenterologist on 10/31 due to increased abdominal pain and shortness of breath and a repeat CT scan abd and pelvis demonstrated a massive amount of intra-abdominal free air consistent with a bowel perforation.  Patient underwent exploratory laprotomy s/p bowel perforation on 10/31 with intention to return to surgery within 20-48 hours for a re-exploration attempt. Patient was sent to ICU intubated and sedated PCCM consulted for vent management on 10/31.  She  developed severe sepsis secondary to bowel perforation s/p exploratory lap requiring fluid resuscitation, antibiotics, and vasopressors.  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  Pt extubated 11/8 and transitioned to 3L O2 via nasal canula without complications.    SIGNIFICANT DIAGNOSTIC STUDIES 10/31-Patient admitted to [facility] ICU status post exploratory laparotomy due to bowel perforation and mechanically ventilated 11/4- EXPLORATORY LAPAROTOMY and right colon resection (N/A) COLOSTOMY (N/A) 11/8-Pt extubated  SIGNIFICANT EVENTS 10/31-Patient admitted to [facility] ICU status post exploratory laparotomy due to bowel perforation and mechanically ventilated 11/4- EXPLORATORY LAPAROTOMY and right colon resection (N/A) COLOSTOMY (N/A) 11/8-Pt extubated, still with moderate vomiting  MICRO DATA  None  ANTIBIOTICS Meropenem 11/2>>stop date 11/11  CONSULTS Surgery Intensivist  TUBES / LINES Right IJ 10/31>>   Discharge Exam: General: resting in bed, in NAD. Neuro: A&O x 3, non-focal.  HEENT: [place]/AT. PERRL, sclerae anicteric. Cardiovascular: RRR, no M/R/G.  Lungs: Respirations even and unlabored.  CTA bilaterally,  No W/R/R.  Abdomen: BS x 4, soft, NT/ND.  Musculoskeletal: No gross deformities, no edema.  Skin: Intact, warm, no rashes.   Vitals:   09/29/16 1000 09/29/16 1100 09/29/16 1200 09/29/16 1300  BP:  108/66 (!) 112/56 (!) 97/58  Pulse: 87 86 91 92  Resp: 14 14 16  (!) 22  Temp:   97.7 F (36.5 C)   TempSrc:   Oral   SpO2: 100% 100% 98% 98%  Weight:      Height:         Discharge Labs  BMET  Recent Labs Lab 09/25/16 0536  09/27/16 0420 09/28/16 0431 09/29/16 0312 09/30/16 0614 10/01/16 0425  NA 142  < > 138 140 142 141 138  K 4.1  < > 4.0 4.0 3.8 3.1* 3.4*  CL 106  < > 106 105 107 109 106  CO2 33*  < > 28 30 32 30 29  GLUCOSE 98  < > 123*  93 109* 117* 116*  BUN 29*  < > 26* 27* 29* 25* 21*  CREATININE 0.69  < > 0.62 0.64 0.62 0.58 0.58  CALCIUM 7.1*  < > 6.9* 7.1* 7.1* 6.8* 7.1*  MG 1.9  --  1.8 2.1  --  1.8 2.1  PHOS 2.4*  --  2.3* 2.8 2.6  --   --   < > = values in this interval not displayed.  CBC  Recent Labs Lab 09/27/16 0420 09/29/16 0840 09/30/16 0433  HGB 10.0* 8.2* 8.0*  HCT 30.1* 24.6* 24.7*  WBC 16.6* 16.5* 14.7*  PLT 320 290 274

## 2016-09-29 NOTE — Progress Notes (Signed)
PULMONARY / CRITICAL CARE MEDICINE   Name: Ruth Gray MRN: [id number] DOB: 26-Nov-1945    ADMISSION DATE:  09/19/2016 CONSULTATION DATE: 09/21/16  REFERRING MD:  Dr. Rayna Sexton  CHIEF COMPLAINT: Post op Vent Management s/p Perforated bowel  Patient profile: Ruth Gray is a 70 yo female with past medical history significant for RA on immunosuppressive therapy. Patient presents on 10/29 to [facility] with persistent abdominal pain with intractable nausea and vomiting. CT of the abdomen was concerning for diverticulitis versus colitis.Marland Kitchen She was treated with oral antibiotics as an outpatient. She did not improve despite of antibiotic use. She was admitted with elevated WBC.  Patient was evaluated by gastroenterologist on 10/31 with increased abdominal pain and shortness of breath and the repeat CT  Scan demonstrated a massive amount of intra- abdominal free air consistent with a bowel perforation.  Patient underwent exploratory laprotomy s/p bowel perforation with intention to return to surgery likely 20-48 hours for re-exploration attempt. Patient was sent to ICU intubated and mechanically ventilated PCCM consulted for vent management.   SUBJECTIVE:  Patient remains intubated and lightly sedated, Patient has tolerated pressure support well thus far.  No acute events overnight.  SIGNIFICANT EVENTS 10/31 EXPLORATORY LAPAROTOMY (N/A) COLON RESECTION- Ascending and Sigmoid 11/2 Remains intubated,sedated, plan for re-exploration 11/4 11/4 EXPLORATORY LAPAROTOMY and right colon resection (N/A) COLOSTOMY (N/A) 11/8 extubated, still with moderate vomiting  REVIEW OF SYSTEMS:   Review of Systems  Constitutional: Negative for chills and fever.  HENT: Negative for hearing loss.   Eyes: Negative for blurred vision and double vision.  Respiratory: Positive for shortness of breath. Negative for cough.   Cardiovascular: Negative for chest pain.  Gastrointestinal: Positive for  abdominal pain and vomiting.  Genitourinary: Negative for dysuria.  Musculoskeletal: Negative for myalgias.  Skin: Negative for rash.  Neurological: Negative for dizziness.  Endo/Heme/Allergies: Does not bruise/bleed easily.  Psychiatric/Behavioral: Negative for depression.     VITAL SIGNS: BP 112/63 (BP Location: Left Arm)   Pulse 94   Temp 97.8 F (36.6 C) (Oral)   Resp 19   Ht 5\' 7"  (1.702 m)   Wt 103.2 kg (227 lb 8.2 oz)   SpO2 98%   BMI 35.63 kg/m   HEMODYNAMICS: CVP:  [6 mmHg-20 mmHg] 8 mmHg  VENTILATOR SETTINGS: Vent Mode: PSV FiO2 (%):  [30 %] 30 % Set Rate:  [15 bmp] 15 bmp Vt Set:  [450 mL] 450 mL PEEP:  [5 cmH20] 5 cmH20 Pressure Support:  [10 cmH20] 10 cmH20  INTAKE / OUTPUT: I/O last 3 completed shifts: In: 1983.9 [I.V.:1115.8; NG/GT:314.8; IV Piggyback:553.3] Out: 1690 [Urine:1370; Emesis/NG output:250; Stool:70]   Physical Examination:   GENERAL:well developed, no acute distress, extubated HEAD: Normocephalic, atraumatic.  EYES: Pupils equal, round, reactive to light.  No scleral icterus.  MOUTH: Moist mucosal membrane. NECK: Supple, No JVD PULMONARY: diminished bibasilar, no wheezes, crackles, rhonchi noted CARDIOVASCULAR: S1 and S2. Regular rate and rhythm. No murmurs, rubs, or gallops.  GASTROINTESTINAL:  +distended. No masses. - bowel sounds. Dressing cdi /osotmy in place.  MUSCULOSKELETAL: No swelling, clubbing, or edema.  NEUROLOGIC: awake alert, following commands.   SKIN: midline abdominal incision  STUDIES:  10/29 CT abdomen>>Diverticulosis of descending and proximal sigmoid colon is noted.Diffuse wall thickening of sigmoid colon is noted with surroundinginflammation most consistent with diverticulitis or colitis.The more proximal colon is dilated and air-filled suggesting ileusor possibly distal colonic obstruction. 10/31 CT abdomen>>Large amount of free intraperitoneal air, consistent with bowelperforation. Small amount of  free fluid in  pelvic cul-de-sac, but noabscess identified.  CULTURES: None   ANTIBIOTICS: 10/31 Meropenem>>  SIGNIFICANT EVENTS: 10/31 patient admitted to [facility] ICU status post exploratory laparotomy due to bowel perforation and mechanically ventilated 11/4 EXPLORATORY LAPAROTOMY and right colon resection (N/A) COLOSTOMY (N/A)  LINES/TUBES: 10/31 Right IJ>>  DISCUSSION: 70 YO Female with perforated bowel and diverticulitis now s/p exploratory laparotomy on 10/31  11/4 re-exploration and colostomy placed   ASSESSMENT / PLAN: PULMONARY A: Acute respiratory failure Mechanical Ventilation P:   Continue vent support SBT today 11/7 Fentanylfor sedation Routine ABG CXR as needed  CARDIOVASCULAR A:  Tachycardia-resolved P:  Continuous telemetry Neosynephrine gtt as needed to maintain map >65  RENAL A:   Acute kidney injury-resolved P:   Strict I/O Trend BMP's Replace electrolytes per ICU protocol  GASTROINTESTINAL A:   Exploratory laparotomy -10/31, s/p perforated bowel 11/4 re-exploration with colostomy Emesis secondary to tube feeds 11/7 P:   Discontinue tube feedings for now 11/7 OG tube LIS Famotidine for GIP Keep dressing/incision CDI Follow up gen surgery recs  HEMATOLOGIC A:   Anemia Hx: Rheumatoid arthritis P:  Trend CBC's Heparin for VTE prophylaxis Transfuse if Hgb<7 Monitor for s/sx of bleeding  INFECTIOUS A:   Exploratory laparotomy s/p bowel perforation P:   Trend WBC's and monitor fever curve Continue Meropenem for intraabdominal infections  ENDOCRINE A:   No active issues P:   Monitor BS intermittently with BMP  NEUROLOGIC A:   No active issues P:   RASS goal: 0 to -1 Fentanyl gtt to maintain RASS goal WUA daily Lights on during the day Frequent reorientation  Bincy Varughese,AG-ACNP Pulmonary & Critical Care  STAFF NOTE: I, Dr. Stephanie Acre have personally reviewed patient's available data, including medical history, events of  note, physical examination and test results as part of my evaluation. I have discussed with NP Varughese  and other care providers such as pharmacist, RN and RRT.    S: Extubated early this morning, doing well now, initially with some mild confusion postextubation, but now more alert and awake able to tell me year, day, month, and date of birth, has some mild complaints of abdominal pain but no other major complaints. Still having some episodes of emesis with trickle feeds  A:70 YO Female with perforated bowel and diverticulitis now s/p exploratory laparotomy on 10/31  11/4 re-exploration and colostomy placed, and functioning. Extubated 11/8  Acute Hypoxic respiratory failure- improving-extubated Acute renal failure - resolving Metabolic Encephalopathy- improving Perforated colon, s/p ileostomy with drains Diverticulitis    P:  -Extubated today -Still having some issues with trickle feeds via NG tube, has episodes of emesis. We'll attempt swallow eval today, if not tolerating swallow eval will try with trickle feeds via NG tube again -Continue current antibiotics -Good urine output noted -Surgery following actively, appreciate their recommendations, now has a functioning ileostomy    .  Rest per NP/medical resident whose note is outlined above and that I agree with  The patient is critically ill with multiple organ systems failure and requires high complexity decision making for assessment and support, frequent evaluation and titration of therapies, application of advanced monitoring technologies and extensive interpretation of multiple databases.   Critical Care Time devoted to patient care services described in this note is  35 Minutes.   This time reflects time of care of this signee Dr Stephanie Acre.  This critical care time does not reflect procedure time, or teaching time or supervisory time of PA/NP/Med-student/Med Resident  etc but could involve care discussion  time.  Stephanie Acre, MD [place] [facility] Pager [phone] (please enter 7-digits) On Call Pager - [phone] (please enter 7-digits)  Note: This note was prepared with Dragon dictation along with smaller phrase technology. Any transcriptional errors that result from this process are unintentional.

## 2016-09-29 NOTE — Progress Notes (Signed)
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  She was extubated this am and sitting up comfortably in bed.  She is conversant but confused about where she is today.  She denies any abdominal pain or nausea.    Vitals:   09/29/16 0530 09/29/16 0600  BP:    Pulse: 89 90  Resp: 11 11  Temp:      I/O last 3 completed shifts: In: 1679.1 [I.V.:1098.9; NG/GT:230.2; IV Piggyback:350] Out: 1465 [Urine:995; Emesis/NG output:450; Stool:20] No intake/output data recorded.  PE:  Gen: on vent, responds to pain, NG tube in nare  GI: mucous fistulas and ostomy pink patent stool in RUQ, air and stool in ileostomy bag, midline incision clean and dry, no erythema   CBC Latest Ref Rng & Units 09/27/2016 09/26/2016 09/25/2016  WBC 3.6 - 11.0 K/uL 16.6(H) 17.3(H) 16.6(H)  Hemoglobin 12.0 - 16.0 g/dL 10.0(L) 9.7(L) 11.1(L)  Hematocrit 35.0 - 47.0 % 30.1(L) 29.3(L) 33.3(L)  Platelets 150 - 440 K/uL 320 413 426   CMP Latest Ref Rng & Units 09/29/2016 09/28/2016 09/27/2016  Glucose 65 - 99 mg/dL 903(E) 93 092(Z)  BUN 6 - 20 mg/dL 30(Q) 76(A) 26(J)  Creatinine 0.44 - 1.00 mg/dL 3.35 4.56 2.56  Sodium 135 - 145 mmol/L 142 140 138  Potassium 3.5 - 5.1 mmol/L 3.8 4.0 4.0  Chloride 101 - 111 mmol/L 107 105 106  CO2 22 - 32 mmol/L 32 30 28  Calcium 8.9 - 10.3 mg/dL 7.1(L) 7.1(L) 6.9(L)  Total Protein 6.5 - 8.1 g/dL - - -  Total Bilirubin 0.3 - 1.2 mg/dL - - -  Alkaline Phos 38 - 126 U/L - - -  AST 15 - 41 U/L - - -  ALT 14 - 54 U/L - - -     Assessment/Plan: 70 year old female with 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  Pain: Scheduled tylenol and toradol for baseline pain,  prn diluadid  Res: Extubated, pulm toilet Cardio:  She has been hemodynamical normal and off of pressors for about 24 hours Abd: continue penrose drain, good output from ostomies, likely swallow study later today if mental status permits.  If she passes that, ok for clear liquids and removal of NG tube.  Restarting tube feeds in the mean time to get her some nutrition and can advance those as she tolerates.  GU: continue foley for strict I&O monitoring  She remains critically ill in the ICU. I discussed and appreciate the involvement of Dr. Waldon Merl of the critical care team.

## 2016-09-30 DIAGNOSIS — M069 Rheumatoid arthritis, unspecified: Secondary | ICD-10-CM

## 2016-09-30 LAB — GLUCOSE, CAPILLARY
GLUCOSE-CAPILLARY: 96 mg/dL (ref 65–99)
GLUCOSE-CAPILLARY: 90 mg/dL (ref 65–99)
GLUCOSE-CAPILLARY: 100 mg/dL — AB (ref 65–99)
Glucose-Capillary: 95 mg/dL (ref 65–99)
Glucose-Capillary: 107 mg/dL — ABNORMAL HIGH (ref 65–99)

## 2016-09-30 LAB — BASIC METABOLIC PANEL
ANION GAP: 2 — AB (ref 5–15)
BUN: 25 mg/dL — ABNORMAL HIGH (ref 6–20)
CALCIUM: 6.8 mg/dL — AB (ref 8.9–10.3)
CHLORIDE: 109 mmol/L (ref 101–111)
CO2: 30 mmol/L (ref 22–32)
CREATININE: 0.58 mg/dL (ref 0.44–1.00)
GFR calc non Af Amer: >60 mL/min (ref >60)
Glucose, Bld: 117 mg/dL — ABNORMAL HIGH (ref 65–99)
Potassium: 3.1 mmol/L — ABNORMAL LOW (ref 3.5–5.1)
SODIUM: 141 mmol/L (ref 135–145)

## 2016-09-30 LAB — CBC WITH DIFFERENTIAL/PLATELET
Basophils Absolute: 0 10*3/uL (ref 0–0.1)
Basophils Relative: 0 %
EOS ABS: 0.2 10*3/uL (ref 0–0.7)
Eosinophils Relative: 1 %
HEMATOCRIT: 24.7 % — AB (ref 35.0–47.0)
HEMOGLOBIN: 8 g/dL — AB (ref 12.0–16.0)
LYMPHS ABS: 1.4 10*3/uL (ref 1.0–3.6)
LYMPHS PCT: 9 %
MCH: 31.3 pg (ref 26.0–34.0)
MCHC: 32.2 g/dL (ref 32.0–36.0)
MCV: 97 fL (ref 80.0–100.0)
Monocytes Absolute: 1 10*3/uL — ABNORMAL HIGH (ref 0.2–0.9)
Monocytes Relative: 7 %
NEUTROS ABS: 12.1 10*3/uL — AB (ref 1.4–6.5)
Neutrophils Relative %: 83 %
Platelets: 274 10*3/uL (ref 150–440)
RBC: 2.55 MIL/uL — AB (ref 3.80–5.20)
RDW: 15.8 % — ABNORMAL HIGH (ref 11.5–14.5)
WBC: 14.7 10*3/uL — AB (ref 3.6–11.0)

## 2016-09-30 LAB — MAGNESIUM: MAGNESIUM: 1.8 mg/dL (ref 1.7–2.4)

## 2016-09-30 MED ORDER — FAMOTIDINE 20 MG PO TABS
20.0000 mg | ORAL_TABLET | Freq: Two times a day (BID) | ORAL | Status: DC
  Administered 2016-09-30 – 2016-10-13 (×25): 20 mg via ORAL
  Filled 2016-09-30 (×25): qty 1

## 2016-09-30 MED ORDER — SODIUM CHLORIDE 0.9 % IV BOLUS (SEPSIS)
250.0000 mL | Freq: Once | INTRAVENOUS | Status: AC
  Administered 2016-09-30: 250 mL via INTRAVENOUS

## 2016-09-30 MED ORDER — SODIUM CHLORIDE 0.9 % IV SOLN
30.0000 meq | Freq: Once | INTRAVENOUS | Status: AC
  Administered 2016-09-30: 30 meq via INTRAVENOUS
  Filled 2016-09-30: qty 15

## 2016-09-30 MED ORDER — ACETAMINOPHEN 325 MG PO TABS
650.0000 mg | ORAL_TABLET | Freq: Four times a day (QID) | ORAL | Status: DC
  Administered 2016-09-30 – 2016-10-13 (×40): 650 mg via ORAL
  Filled 2016-09-30 (×42): qty 2

## 2016-09-30 MED ORDER — MAGNESIUM SULFATE 2 GM/50ML IV SOLN
2.0000 g | Freq: Once | INTRAVENOUS | Status: AC
  Administered 2016-09-30: 2 g via INTRAVENOUS
  Filled 2016-09-30: qty 50

## 2016-09-30 MED ORDER — ENSURE ENLIVE PO LIQD
237.0000 mL | Freq: Three times a day (TID) | ORAL | Status: DC
  Administered 2016-09-30 – 2016-10-06 (×14): 237 mL via ORAL
  Administered 2016-10-06: 50 mL via ORAL
  Administered 2016-10-06: 237 mL via ORAL

## 2016-09-30 NOTE — Progress Notes (Signed)
~   0300 notified NP on floor of patients low urine output, received order from Np, followed through with order.  ~0500 spoke with NP on floor again, Urine output minimally improved post intervention. Received new order. Followed through with new order. Will continue to assess the patient.

## 2016-09-30 NOTE — Progress Notes (Signed)
Nutrition Follow-up  DOCUMENTATION CODES:   Not applicable  INTERVENTION:  -Encouraged smaller, more frequent meals. Encoruaged pt to nibble throughout the day on nutritionally dense foods and to sip on Ensure throughout the day. Plan to order snacks between meals as well as the nutritional supplements. Family at bedside and plans to encourage po intake -Ensure Enlive po TID between meals, each supplement provides 350 kcal and 20 grams of protein -Magic Cup TID on meal trays  NUTRITION DIAGNOSIS:   Inadequate oral intake related to acute illness as evidenced by NPO status.  Improving as diet advanced, snacks and supplements added  GOAL:   Patient will meet greater than or equal to 90% of their needs  MONITOR:   PO intake, Supplement acceptance, Labs, Weight trends, I & O's  REASON FOR ASSESSMENT:   Ventilator    ASSESSMENT:   Pt s/p extubation on 11/9. Pt very weak, appetite poor. Pt does not complain of any N/V or abdominal pain. PT consulted today  +stool via ostomy, minimal output via fistulas  Diet advanced to CL yesterday and then to Dysphagia III this AM. Pt tolerated broth and drinks this AM plus a bite of pancake and some yogurt.   Labs and meds reviewed  Diet Order:  DIET DYS 3 Room service appropriate? Yes with Assist; Fluid consistency: Thin  Skin:  Reviewed, no issues  Last BM:  11/9  Height:   Ht Readings from Last 1 Encounters:  09/21/16 5\' 7"  (1.702 m)    Weight:   Wt Readings from Last 1 Encounters:  09/30/16 230 lb 13.2 oz (104.7 kg)     BMI:  Body mass index is 36.15 kg/m.  Estimated Nutritional Needs:   Kcal:  2000-2200 kcals  Protein:  >/= 105 g  Fluid:  >/= 2 L  EDUCATION NEEDS:   Education needs addressed  13/09/17 MS, RD, LDN [phone] Pager  [phone] Weekend/On-Call Pager

## 2016-09-30 NOTE — Progress Notes (Signed)
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  She is sitting up much more alert today.  She states some pain in abdomen.  No nausea with clear liquid diet.  She did get 500cc fluid for low UOP overnight   Vitals:   09/30/16 1400 09/30/16 1500  BP: 132/61   Pulse: (!) 105 (!) 103  Resp: (!) 22 (!) 4  Temp:      I/O last 3 completed shifts: In: 4003.5 [P.O.:240; I.V.:2643.5; Other:220; IV Piggyback:900] Out: 1030 [Urine:795; Emesis/NG output:230; Stool:5] Total I/O In: 1040 [I.V.:675; IV Piggyback:365] Out: 510 [Urine:160; Stool:350]  PE:  Gen: on vent, responds to pain, NG tube in nare  GI: mucous fistulas and ostomy pink patent stool in RUQ, air and stool in ileostomy bag and RUQ fistula, midline incision clean and dry, no erythema   CBC Latest Ref Rng & Units 09/30/2016 09/29/2016 09/27/2016  WBC 3.6 - 11.0 K/uL 14.7(H) 16.5(H) 16.6(H)  Hemoglobin 12.0 - 16.0 g/dL 8.0(L) 8.2(L) 10.0(L)  Hematocrit 35.0 - 47.0 % 24.7(L) 24.6(L) 30.1(L)  Platelets 150 - 440 K/uL 274 290 320   CMP Latest Ref Rng & Units 09/30/2016 09/29/2016 09/28/2016  Glucose 65 - 99 mg/dL 256(L) 893(T) 93  BUN 6 - 20 mg/dL 34(K) 87(G) 81(L)  Creatinine 0.44 - 1.00 mg/dL 5.72 6.20 3.55  Sodium 135 - 145 mmol/L 141 142 140  Potassium 3.5 - 5.1 mmol/L 3.1(L) 3.8 4.0  Chloride 101 - 111 mmol/L 109 107 105  CO2 22 - 32 mmol/L 30 32 30  Calcium 8.9 - 10.3 mg/dL 9.7(C) 7.1(L) 7.1(L)  Total Protein 6.5 - 8.1 g/dL - - -  Total Bilirubin 0.3 - 1.2 mg/dL - - -  Alkaline Phos 38 - 126 U/L - - -  AST 15 - 41 U/L - - -  ALT 14 - 54 U/L - - -     Assessment/Plan: 70 year old female with 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  Pain: Scheduled tylenol and toradol for baseline pain, prn diluadid  Res: Extubated, pulm toilet Cardio:  She has been hemodynamical normal and off of pressors for about 24 hours Abd: continue penrose drain, good output from ostomies, will advance to full liquid diet today GU: continue foley for strict I&O monitoring Dispo: ok for transfer to floor today, much improved PT consult for deconditioning

## 2016-09-30 NOTE — Progress Notes (Signed)
Key Points: Use following P&T approved IV to PO antibiotic change policy.  Description contains the criteria that are approved Note: Policy Excludes:  Esophagectomy patientsPHARMACIST - PHYSICIAN COMMUNICATION DR:   Dema Severin CONCERNING: IV to Oral Route Change Policy  RECOMMENDATION: This patient is receiving famotidine by the intravenous route.  Based on criteria approved by the Pharmacy and Therapeutics Committee, the intravenous medication(s) is/are being converted to the equivalent oral dose form(s).   DESCRIPTION: These criteria include:  The patient is eating (either orally or via tube) and/or has been taking other orally administered medications for a least 24 hours  The patient has no evidence of active gastrointestinal bleeding or impaired GI absorption (gastrectomy, short bowel, patient on TNA or NPO).  If you have questions about this conversion, please contact the Pharmacy Department    Horris Latino, PharmD Pharmacy Resident 09/30/2016 11:42 AM

## 2016-09-30 NOTE — Progress Notes (Signed)
Pharmacy Antibiotic Note  Ruth Gray is a 70 y.o. 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  Pharmacy has been consulted for meropenem dosing and electrolyte monitoring. Patient currently has poor PO intake.  Plan: 1. Meropenem: Patient is on day 9 of meropenem. Will continue meropenem 1 g iv q 8 hours for intra-abdominal infection. Stop date has been entered for 7 days after last surgery which was 11/4.  2. Electrolytes: 11/09 0600 K = 3.1; mag = 1.8 Patient will receive mag 2 g IV x 1 and KCl 30 mEq IV x 1. Will f/u with am labs  Height: 5\' 7"  (170.2 cm) Weight: 230 lb 13.2 oz (104.7 kg) IBW/kg (Calculated) : 61.6  Temp (24hrs), Avg:97.9 F (36.6 C), Min:97.6 F (36.4 C), Max:98 F (36.7 C)   Recent Labs Lab 09/25/16 1951 09/26/16 0428 09/27/16 0420 09/28/16 0431 09/29/16 0312 09/29/16 0840 09/30/16 0433 09/30/16 0614  WBC 16.6* 17.3* 16.6*  --   --  16.5* 14.7*  --   CREATININE 0.85 0.77 0.62 0.64 0.62  --   --  0.58    Estimated Creatinine Clearance: 82.6 mL/min (by C-G formula based on SCr of 0.58 mg/dL).    Allergies  Allergen Reactions  . Ciprofloxacin Swelling  . Tramadol Nausea And Vomiting  . Penicillins Swelling and Rash    Has patient had a PCN reaction causing immediate rash, facial/tongue/throat swelling, SOB or lightheadedness with hypotension: {no Has patient had a PCN reaction causing severe rash involving mucus membranes or skin necrosis: no Has patient had a PCN reaction that required hospitalization no Has patient had a PCN reaction occurring within the last 10 years: no If all of the above answers are "NO", then may proceed with Cephalosporin use.     Antimicrobials this admission: Aztreonam 10/29 >> 10/31 Metronidazole 10/29 >> 10/31 Meropenem 11/1 >>  Dose adjustments this admission:  Microbiology results: 10/31 MRSA PCR: negative  Thank you for  allowing pharmacy to be a part of this patient's care.  11/31, PharmD Pharmacy Resident 09/30/2016 11:37 AM

## 2016-09-30 NOTE — Progress Notes (Signed)
Report called to nurse prior to transfer. Pt. Transported in bed, room air, foley catheter in place, by Chasity, Nt. Patients sons x2 notified of transfer. Personal belongings, medication (merepenem and aceteminophen) and chart transferred.

## 2016-09-30 NOTE — Progress Notes (Addendum)
PULMONARY / CRITICAL CARE MEDICINE   Name: Ruth Gray MRN: [id number] DOB: 1946/11/01    ADMISSION DATE:  09/19/2016 CONSULTATION DATE: 09/21/16  REFERRING MD:  Dr. Rayna Sexton  CHIEF COMPLAINT: Post op Vent Management s/p Perforated bowel  Patient profile: Ruth Gray is a 70 yo female with past medical history significant for RA on immunosuppressive therapy. Patient presents on 10/29 to [facility] with persistent abdominal pain with intractable nausea and vomiting. CT of the abdomen was concerning for diverticulitis versus colitis.Marland Kitchen She was treated with oral antibiotics as an outpatient. She did not improve despite of antibiotic use. She was admitted with elevated WBC.  Patient was evaluated by gastroenterologist on 10/31 with increased abdominal pain and shortness of breath and the repeat CT  Scan demonstrated a massive amount of intra- abdominal free air consistent with a bowel perforation.  Patient underwent exploratory laprotomy s/p bowel perforation with intention to return to surgery likely 20-48 hours for re-exploration attempt. Patient was sent to ICU intubated and mechanically ventilated PCCM consulted for vent management.   SUBJECTIVE:  Patient remains on RA, Afebrile, is able to tolerate clear liquids well, Will be re-evaluated by speech in the am.  SIGNIFICANT EVENTS 10/31 EXPLORATORY LAPAROTOMY (N/A) COLON RESECTION- Ascending and Sigmoid 11/2 Remains intubated,sedated, plan for re-exploration 11/4 11/4 EXPLORATORY LAPAROTOMY and right colon resection (N/A) COLOSTOMY (N/A) 11/8 extubated,  REVIEW OF SYSTEMS:   Review of Systems  Constitutional: Negative for chills and fever.  HENT: Negative for hearing loss.   Eyes: Negative for blurred vision and double vision.  Respiratory: Negative for cough and shortness of breath.   Cardiovascular: Negative for chest pain.  Gastrointestinal: Negative for abdominal pain and vomiting.  Genitourinary: Negative  for dysuria.  Musculoskeletal: Negative for myalgias.  Skin: Negative for rash.  Neurological: Negative for dizziness.  Endo/Heme/Allergies: Does not bruise/bleed easily.  Psychiatric/Behavioral: Negative for depression.     VITAL SIGNS: BP 120/61   Pulse 87   Temp 98 F (36.7 C) (Oral)   Resp 18   Ht 5\' 7"  (1.702 m)   Wt 104.7 kg (230 lb 13.2 oz)   SpO2 93%   BMI 36.15 kg/m   HEMODYNAMICS: CVP:  [2 mmHg-13 mmHg] 12 mmHg  VENTILATOR SETTINGS: Vent Mode: PSV FiO2 (%):  [30 %] 30 % PEEP:  [5 cmH20] 5 cmH20 Pressure Support:  [5 cmH20-10 cmH20] 5 cmH20  INTAKE / OUTPUT: I/O last 3 completed shifts: In: 2456.1 [I.V.:1905.9; Other:220; NG/GT:80.2; IV Piggyback:250] Out: 1375 [Urine:890; Emesis/NG output:480; Stool:5]   Physical Examination:   GENERAL:well developed, no acute distress, extubated HEAD: Normocephalic, atraumatic.  EYES: Pupils equal, round, reactive to light.  No scleral icterus.  MOUTH: Moist mucosal membrane. NECK: Supple, No JVD PULMONARY: diminished bibasilar, no wheezes, crackles, rhonchi noted CARDIOVASCULAR: S1 and S2. Regular rate and rhythm. No murmurs, rubs, or gallops.  GASTROINTESTINAL:  +distended. No masses. - bowel sounds. Dressing cdi /osotmy in place.  MUSCULOSKELETAL: No swelling, clubbing, or edema.  NEUROLOGIC: awake alert, following commands.   SKIN: midline abdominal incision  STUDIES:  10/29 CT abdomen>>Diverticulosis of descending and proximal sigmoid colon is noted.Diffuse wall thickening of sigmoid colon is noted with surroundinginflammation most consistent with diverticulitis or colitis.The more proximal colon is dilated and air-filled suggesting ileusor possibly distal colonic obstruction. 10/31 CT abdomen>>Large amount of free intraperitoneal air, consistent with bowelperforation. Small amount of free fluid in pelvic cul-de-sac, but noabscess identified.  CULTURES: None   ANTIBIOTICS: 10/31 Meropenem>>  SIGNIFICANT  EVENTS:  10/31 [facility] status post exploratory laparotomy due to bowel perforation and mechanically ventilated 11/4 EXPLORATORY LAPAROTOMY and right colon resection (N/A) COLOSTOMY (N/A)  LINES/TUBES: 10/31 Right IJ>>  DISCUSSION: 70 YO Female with perforated bowel and diverticulitis now s/p exploratory laparotomy on 10/31  11/4 re-exploration and colostomy placed.   ASSESSMENT / PLAN: PULMONARY A: Acute respiratory failure- Resolved(Extubated on 09/29/16) P Remains on RA, no distress noted  CARDIOVASCULAR A:  Tachycardia-resolved P:  Continuous telemetry  RENAL A:   Acute kidney injury-resolved P:   Strict I/O Trend BMP's Replace electrolytes per ICU protocol  GASTROINTESTINAL A:   Exploratory laparotomy -10/31, s/p perforated bowel 11/4 re-exploration with colostomy Emesis secondary to tube feeds 11/7 P:   Famotidine for GIP Keep dressing/incision CDI SLP eval in am Follow up gen surgery recs  HEMATOLOGIC A:   Anemia Hx: Rheumatoid arthritis P:  Trend CBC's Heparin for VTE prophylaxis Transfuse if Hgb<7 Monitor for s/sx of bleeding  INFECTIOUS A:   Exploratory laparotomy s/p bowel perforation P:   Trend WBC's and monitor fever curve Continue Meropenem for intraabdominal infections  ENDOCRINE A:   No active issues P:   Monitor BS intermittently with BMP  NEUROLOGIC A:   No active issues P:   Minimize sedating drugs  Bincy Varughese,AG-ACNP Pulmonary & Critical Care  STAFF NOTE: I, Dr. Stephanie AcreVishal Latifah Padin have personally reviewed patient's available data, including medical history, events of note, physical examination and test results as part of my evaluation. I have discussed with NP Karin GoldenBlakeney Tukov Varughese  and other care providers such as pharmacist, RN and RRT.    S: Patient more alert and awake now, has low urine output and required small boluses of normal saline overnight, did pass a swallow study, clear liquid diet at  this time.  A:70 YO Female with perforated bowel and diverticulitis now s/p exploratory laparotomy on 10/31  11/4 re-exploration and colostomy placed, and functioning. Extubated 11/8  Acute Hypoxic respiratory failure- improving-extubated Acute renal failure - resolving Metabolic Encephalopathy- improving Perforated colon, s/p ileostomy with drains Diverticulitis    P:  -Extubated 11/8 -She passed swallow study with speech therapy, cleared for liquid diet, NG tube pulled yesterday -Continue current antibiotics -Low urine output, most like is secondary to poor by mouth intake, had some boluses of 250 NS -Surgery following actively, appreciate their recommendations, now has a functioning ileostomy -Overall good clinical improvement   Patient stable now, on a dysphagia 3 diet, NG tube is out, overall great critical improvement, physical therapy is currently pending, patient stable to transfer to MedSurg unit. Spoke with hospitalist, Dr. Elisabeth PigeonVachhani, they will take over care on 11/10 morning.  Marland Kitchen.  Rest per NP/medical resident whose note is outlined above and that I agree with  The patient is critically ill with multiple organ systems failure and requires high complexity decision making for assessment and support, frequent evaluation and titration of therapies, application of advanced monitoring technologies and extensive interpretation of multiple databases.   Critical Care Time devoted to patient care services described in this note is  35 Minutes.   This time reflects time of care of this signee Dr Stephanie AcreVishal Rilen Shukla.  This critical care time does not reflect procedure time, or teaching time or supervisory time of PA/NP/Med-student/Med Resident etc but could involve care discussion time.  Stephanie AcreVishal Adaira Centola, MD [facility] Pager [phone]- [phone] (please enter 7-digits) On Call Pager - [phone] (please enter 7-digits)  Note: This  note was prepared with Dragon  dictation along with smaller phrase technology. Any transcriptional errors that result from this process are unintentional.

## 2016-09-30 NOTE — Care Management (Signed)
Met again with patient and both sons at her bedside this AM. Ruth Gray states that patient has multiple stairs to climb and no way to bring everything downstairs. I explained that PT would work with her before discharge (including stairs). Patient requests SNF at [facility] and second choice [facility] if her insurance will pay for it. I have updated CSW- pending PT evaluation- auth may then be started if SNF is recommended.

## 2016-09-30 NOTE — Progress Notes (Signed)
-  pt came with intra abdominal perforation , had surgery and was intubated. Extubated and on dysphagia diet for last 2 days. She is stable for transfer to general medical care under hospitalist team as per Dr. Dema Severin. We will resume care from tomorrow.

## 2016-09-30 NOTE — Progress Notes (Signed)
Speech Language Pathology Treatment: Dysphagia  Patient Details Name: Ruth Gray MRN: [id number] DOB: Apr 08, 1946 Today's Date: 09/30/2016 Time: 0830-0930 SLP Time Calculation (min) (ACUTE ONLY): 60 min  Assessment / Plan / Recommendation Clinical Impression  Pt appeared to adequately tolerate trials of thin liquids via cup and puree/soft solids w/ no overt s/s of aspiration noted; no significant oral phase deficits noted w/ increased textures foods though pt was not overly interested in much food d/t baseline gut/abdominal status. Educated Sons/pt on general aspiration precautions and moreso on food options and preparation. Encouraged foods of preference and choice even from home. Answered questions. Recommended diet upgrade to a dysphagia 3 for easier mastication/cutting of meats; moistened and flavored w/ condiments. General aspiration precautions; meds in Puree for easier swallowing as needed. ST will be available for further education if needed while admitted. Sons/pt agreed. NSG updated. Dietician to f/u w/ pt.   HPI HPI: Pt is a 70 yo female with past medical history significant for RA on immunosuppressive therapy. Patient presents on 10/29 to [facility] with persistent abdominal pain with intractable nausea and vomiting. CT of the abdomen was concerning for diverticulitis versus colitis.Marland Kitchen She was treated with oral antibiotics as an outpatient. She did not improve despite of antibiotic use. She was admitted with elevated WBC.  Patient was evaluated by gastroenterologist on 10/31 with increased abdominal pain and shortness of breath and the repeat CT  Scan demonstrated a massive amount of intra- abdominal free air consistent with a bowel perforation.  Patient underwent exploratory laprotomy s/p bowel perforation with intention to return to surgery likely 20-48 hours for re-exploration attempt. Patient was sent to ICU intubated and mechanically ventilated PCCM consulted for  vent management. Patient was successfully extubated at approximately 0545. Patient tolerated the extubated without complications. Minimal thick white secretions with oral suction post extubation. Placed patient on 3 liters nasal cannula. Pt has tolerated her clear liquid diet well since yesterday; NG tube has been removed.       SLP Plan  Continue with current plan of care     Recommendations  Diet recommendations: Dysphagia 3 (mechanical soft);Thin liquid Liquids provided via: Cup;Straw Medication Administration: Whole meds with puree Supervision: Staff to assist with self feeding;Full supervision/cueing for compensatory strategies Compensations: Minimize environmental distractions;Slow rate;Small sips/bites;Lingual sweep for clearance of pocketing;Follow solids with liquid Postural Changes and/or Swallow Maneuvers: Seated upright 90 degrees;Upright 30-60 min after meal                General recommendations:  (Dietician f/u) Oral Care Recommendations: Oral care BID;Staff/trained caregiver to provide oral care Follow up Recommendations: None (TBD) Plan: Continue with current plan of care       GO                 Jerilynn Som, MS, CCC-SLP  Gray,Ruth 09/30/2016, 12:18 PM

## 2016-10-01 LAB — BASIC METABOLIC PANEL
ANION GAP: 3 — AB (ref 5–15)
BUN: 21 mg/dL — ABNORMAL HIGH (ref 6–20)
CHLORIDE: 106 mmol/L (ref 101–111)
CO2: 29 mmol/L (ref 22–32)
Calcium: 7.1 mg/dL — ABNORMAL LOW (ref 8.9–10.3)
Creatinine, Ser: 0.58 mg/dL (ref 0.44–1.00)
GFR calc non Af Amer: >60 mL/min (ref >60)
GLUCOSE: 116 mg/dL — AB (ref 65–99)
Potassium: 3.4 mmol/L — ABNORMAL LOW (ref 3.5–5.1)
Sodium: 138 mmol/L (ref 135–145)

## 2016-10-01 LAB — GLUCOSE, CAPILLARY
GLUCOSE-CAPILLARY: 105 mg/dL — AB (ref 65–99)
GLUCOSE-CAPILLARY: 105 mg/dL — AB (ref 65–99)
GLUCOSE-CAPILLARY: 106 mg/dL — AB (ref 65–99)
Glucose-Capillary: 107 mg/dL — ABNORMAL HIGH (ref 65–99)

## 2016-10-01 LAB — MAGNESIUM: Magnesium: 2.1 mg/dL (ref 1.7–2.4)

## 2016-10-01 MED ORDER — POTASSIUM CHLORIDE 20 MEQ PO PACK
20.0000 meq | PACK | Freq: Once | ORAL | Status: AC
  Administered 2016-10-01: 20 meq via ORAL
  Filled 2016-10-01: qty 1

## 2016-10-01 MED ORDER — HALOPERIDOL LACTATE 5 MG/ML IJ SOLN
5.0000 mg | Freq: Four times a day (QID) | INTRAMUSCULAR | Status: DC | PRN
Start: 2016-10-01 — End: 2016-10-05
  Administered 2016-10-01: 5 mg via INTRAVENOUS
  Filled 2016-10-01: qty 1

## 2016-10-01 NOTE — Progress Notes (Signed)
PULMONARY / CRITICAL CARE MEDICINE   Name: Ruth Gray MRN: [id number] DOB: 02-18-1946    ADMISSION DATE:  09/19/2016 CONSULTATION DATE: 09/21/16  REFERRING MD:  Dr. Rayna Sexton  CHIEF COMPLAINT: Post op Vent Management s/p Perforated bowel  Patient profile: Ruth Gray is a 70 yo female with past medical history significant for RA on immunosuppressive therapy. Patient presents on 10/29 to [facility] with persistent abdominal pain with intractable nausea and vomiting. CT of the abdomen was concerning for diverticulitis versus colitis.Marland Kitchen She was treated with oral antibiotics as an outpatient. She did not improve despite of antibiotic use. She was admitted with elevated WBC.  Patient was evaluated by gastroenterologist on 10/31 with increased abdominal pain and shortness of breath and the repeat CT  Scan demonstrated a massive amount of intra- abdominal free air consistent with a bowel perforation.  Patient underwent exploratory laprotomy s/p bowel perforation with intention to return to surgery likely 20-48 hours for re-exploration attempt. Patient was sent to ICU intubated and mechanically ventilated PCCM consulted for vent management.   SUBJECTIVE:  Patient remains on RA, Afebrile,   SIGNIFICANT EVENTS 10/31 EXPLORATORY LAPAROTOMY (N/A) COLON RESECTION- Ascending and Sigmoid 11/2 Remains intubated,sedated, plan for re-exploration 11/4 11/4 EXPLORATORY LAPAROTOMY and right colon resection (N/A) COLOSTOMY (N/A) 11/8 extubated,doing well 11/9 doing well, PT today 11/10 on medsurg, tolerating PT, and PO diet, PCCM signoff  REVIEW OF SYSTEMS:   Review of Systems  Constitutional: Negative for chills and fever.  HENT: Negative for hearing loss.   Eyes: Negative for blurred vision and double vision.  Respiratory: Negative for cough and shortness of breath.   Cardiovascular: Negative for chest pain.  Gastrointestinal: Negative for abdominal pain and vomiting.   Genitourinary: Negative for dysuria.  Musculoskeletal: Negative for myalgias.  Skin: Negative for rash.  Neurological: Negative for dizziness.  Endo/Heme/Allergies: Does not bruise/bleed easily.  Psychiatric/Behavioral: Negative for depression.     VITAL SIGNS: BP 129/75 (BP Location: Right Arm)   Pulse (!) 123   Temp 98.1 F (36.7 C) (Oral)   Resp 18   Ht 5\' 7"  (1.702 m)   Wt 247 lb 8 oz (112.3 kg)   SpO2 95%   BMI 38.76 kg/m   HEMODYNAMICS: CVP:  [7 mmHg-36 mmHg] 14 mmHg  VENTILATOR SETTINGS:    INTAKE / OUTPUT: I/O last 3 completed shifts: In: 2905 [P.O.:240; I.V.:1650; IV Piggyback:1015] Out: 890 [Urine:540; Stool:350]   Physical Examination:   GENERAL:well developed, no acute distress, extubated HEAD: Normocephalic, atraumatic.  EYES: Pupils equal, round, reactive to light.  No scleral icterus.  MOUTH: Moist mucosal membrane. NECK: Supple, No JVD PULMONARY: diminished bibasilar, no wheezes, crackles, rhonchi noted CARDIOVASCULAR: S1 and S2. Regular rate and rhythm. No murmurs, rubs, or gallops.  GASTROINTESTINAL:  +distended. No masses. - bowel sounds. Dressing cdi /osotmy in place.  MUSCULOSKELETAL: No swelling, clubbing, or edema.  NEUROLOGIC: awake alert, following commands.   SKIN: midline abdominal incision  STUDIES:  10/29 CT abdomen>>Diverticulosis of descending and proximal sigmoid colon is noted.Diffuse wall thickening of sigmoid colon is noted with surroundinginflammation most consistent with diverticulitis or colitis.The more proximal colon is dilated and air-filled suggesting ileusor possibly distal colonic obstruction. 10/31 CT abdomen>>Large amount of free intraperitoneal air, consistent with bowelperforation. Small amount of free fluid in pelvic cul-de-sac, but noabscess identified.  CULTURES: None   ANTIBIOTICS: 10/31 Meropenem>>  SIGNIFICANT EVENTS: 10/31 patient admitted to [facility] ICU status post exploratory laparotomy due to bowel  perforation and mechanically ventilated 11/4  EXPLORATORY LAPAROTOMY and right colon resection (N/A) COLOSTOMY (N/A)  LINES/TUBES: 10/31 Right IJ>>  DISCUSSION: 70 YO Female with perforated bowel and diverticulitis now s/p exploratory laparotomy on 10/31  11/4 re-exploration and colostomy placed.   ASSESSMENT / PLAN:70 YO Female with perforated bowel and diverticulitis now s/p exploratory laparotomy on 10/31  11/4 re-exploration and colostomy placed, and functioning. Extubated 11/8 PULMONARY A: Acute respiratory failure- Resolved(Extubated on 09/29/16) P Remains on RA, no distress noted  CARDIOVASCULAR A:  Tachycardia-resolved P:  Continuous telemetry  RENAL A:   Acute kidney injury-resolved P:   Strict I/O Trend BMP's Replace electrolytes per ICU protocol  GASTROINTESTINAL A:   Exploratory laparotomy -10/31, s/p perforated bowel 11/4 re-exploration with colostomy Emesis secondary to tube feeds 11/7 P:   Famotidine for GIP Keep dressing/incision CDI SLP eval in am Follow up gen surgery recs  HEMATOLOGIC A:   Anemia Hx: Rheumatoid arthritis P:  Trend CBC's Heparin for VTE prophylaxis Transfuse if Hgb<7 Monitor for s/sx of bleeding  INFECTIOUS A:   Exploratory laparotomy s/p bowel perforation P:   Trend WBC's and monitor fever curve Continue Meropenem for intraabdominal infections  ENDOCRINE A:   No active issues P:   Monitor BS intermittently with BMP  NEUROLOGIC A:   No active issues P:   Minimize sedating drugs  Doing well, with overall good pulmonary prognosis.  Thank you for consulting [facility], we will signoff at this time.  Please feel free to contact us with any questions at [phone] (please enter [phone]-digits).  Pulmonary Care time -  Stephanie Acre, MD [facility] Pager [phone] (please enter 7-digits) On Call Pager - [phone] (please enter 7-digits)

## 2016-10-01 NOTE — Progress Notes (Signed)
Pharmacy Antibiotic Note  Ruth Gray is a 70 y.o. 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  Pharmacy has been consulted for meropenem dosing and electrolyte monitoring. Patient currently has poor PO intake.  Plan: 1. Meropenem: Patient is on day 10 of meropenem. Will continue meropenem 1 g iv q 8 hours for intra-abdominal infection. Stop date has been entered for 7 days after last surgery which was 11/4.  2. Electrolytes: 11/10 0600 K = 3.4; mag = 2.1 Patient will receive KCL PO x1. Will f/u with am labs  Height: 5\' 7"  (170.2 cm) Weight: 247 lb 8 oz (112.3 kg) IBW/kg (Calculated) : 61.6  Temp (24hrs), Avg:98 F (36.7 C), Min:97.6 F (36.4 C), Max:98.3 F (36.8 C)   Recent Labs Lab 09/25/16 1951 09/26/16 0428 09/27/16 0420 09/28/16 0431 09/29/16 0312 09/29/16 0840 09/30/16 0433 09/30/16 0614 10/01/16 0425  WBC 16.6* 17.3* 16.6*  --   --  16.5* 14.7*  --   --   CREATININE 0.85 0.77 0.62 0.64 0.62  --   --  0.58 0.58    Estimated Creatinine Clearance: 85.8 mL/min (by C-G formula based on SCr of 0.58 mg/dL).    Allergies  Allergen Reactions  . Ciprofloxacin Swelling  . Tramadol Nausea And Vomiting  . Penicillins Swelling and Rash    Has patient had a PCN reaction causing immediate rash, facial/tongue/throat swelling, SOB or lightheadedness with hypotension: {no Has patient had a PCN reaction causing severe rash involving mucus membranes or skin necrosis: no Has patient had a PCN reaction that required hospitalization no Has patient had a PCN reaction occurring within the last 10 years: no If all of the above answers are "NO", then may proceed with Cephalosporin use.     Antimicrobials this admission: Aztreonam 10/29 >> 10/31 Metronidazole 10/29 >> 10/31 Meropenem 11/1 >>  Dose adjustments this admission:  Microbiology results: 10/31 MRSA PCR: negative  Thank you for  allowing pharmacy to be a part of this patient's care.  11/31, PharmD Clinical Pharmacist 10/01/2016 7:54 AM

## 2016-10-01 NOTE — Consult Note (Addendum)
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  Stomal assessment/size:  MF oval, slightly edematous and dusky in center.  Pink tissue noted in os and producing stool and mucus at this time.  1 1/2" top and 1 1/4" bottom.  Will use barrier ring due to creasing at 3 and 9 o'clock.  Ileostomy 1" round pink and moist.  Flush on left side, will add barrier ring Peristomal assessment: intact Treatment options for stomal/peristomal skin: Barrier ring to both Output Liquid green stool to ileostomy Mucus and brown stool to MF.  Ostomy pouching: 2pc. 2 1/4" system with barrier ring to both.  Education provided: Son at bedside.  Pouch change is performed.  Discussed rationale for barrier ring, frequency of pouch changes.  Discussed that may be able to only apply dry dressing to MF at a later date.  Emptying pouch when 1/3 full. Ileostomy pouch was completely full.  Discussed the corrosive effluent of an ileostomy and importance of skin care.  Son rolled pouch closed and snapped barrier to pouch.  Will continue education.  Written materials left at bedside.  Enrolled patient in [employer] DC program: No WOC team will follow and remain available to patient, medical and nursing teams.  Maple Hudson RN BSN CWON Pager [phone]

## 2016-10-01 NOTE — Evaluation (Signed)
Physical Therapy Evaluation Patient Details Name: Ruth Gray MRN: [id number] DOB: 1946/01/07 Today's Date: 10/01/2016   History of Present Illness  Pt admitted on 10/29 secondary to acute diverticulitis. Pt with complaints of abdmonial pain with nausea/vomiting. PMH includes RA and smoking. Stay complicated by partial bowel obstruction followed by bowel perforation requiring Sx for colon resection and exploratory laparotomy on 10/31. She then transferred to CCU intubated from [facility], and required further SX for re-exploration on 11/4 with ileostomy placed. Pt then extubated on 09/29/16.   Clinical Impression  Pt is a pleasant 70 year old female who was admitted for acute diverticulitis. Stay complicated by multiple surgeries along with prolonged ICU stay. On vent from 10/31-11/4. Pt performs bed mobility and transfers with max assist +2 and RW. Unsafe to perform ambulation at this time. Pt fatigues quickly.  Multiple line/leads, needs high gait belt placement. Pt demonstrates deficits with strength/mobility/endurance/pain. Pt and family very motivated to participate in therapy. Would benefit from skilled PT to address above deficits and promote optimal return to PLOF; recommend transition to STR upon discharge from acute hospitalization.       Follow Up Recommendations SNF    Equipment Recommendations       Recommendations for Other Services       Precautions / Restrictions Precautions Precautions: Fall Restrictions Weight Bearing Restrictions: No      Mobility  Bed Mobility Overal bed mobility: Needs Assistance;+2 for physical assistance Bed Mobility: Supine to Sit     Supine to sit: Max assist;+2 for physical assistance     General bed mobility comments: assist for sequencing and sliding B LE off bed. Heavy assist required for trunk stability with heavy post leaning noted. Once seated at EOB, pt unable to achieve neutral posture. Pt slowly able to progress to sitting at EOB  with only min assist. +2 assist required for return back supine with +2 for sliding to HOB.  Transfers Overall transfer level: Needs assistance Equipment used: Rolling walker (2 wheeled) Transfers: Sit to/from Stand Sit to Stand: Max assist;+2 physical assistance         General transfer comment: Pt attempted transfer x 3 reps. Requires B foot placement and knee blocking for transfer. Once standing, able to stand for 5 seconds each time prior to uncontrolled descent back to bed secondary to fatigue/weakness. Not safe to attempt ambulation to recliner.  Ambulation/Gait             General Gait Details: not appropriate at this time  Stairs            Wheelchair Mobility    Modified Rankin (Stroke Patients Only)       Balance Overall balance assessment: Needs assistance Sitting-balance support: Feet supported;Bilateral upper extremity supported Sitting balance-Leahy Scale: Poor     Standing balance support: Bilateral upper extremity supported Standing balance-Leahy Scale: Poor                               Pertinent Vitals/Pain Pain Assessment: Faces Faces Pain Scale: Hurts little more Pain Location: abdomen with movement Pain Descriptors / Indicators: Grimacing;Dull;Discomfort Pain Intervention(s): Limited activity within patient's tolerance    Home Living Family/patient expects to be discharged to:: Private residence Living Arrangements: Children (son) Available Help at Discharge: Family;Available PRN/intermittently Type of Home: House Home Access: Stairs to enter Entrance Stairs-Rails: Left Entrance Stairs-Number of Steps: 4 Home Layout: Two level Home Equipment: Walker - 2 wheels;Cane - single  point      Prior Function Level of Independence: Independent         Comments: driving, did not need AD prior to admission     Hand Dominance        Extremity/Trunk Assessment   Upper Extremity Assessment: Generalized weakness (B UE  grossly 3+/5)           Lower Extremity Assessment: Generalized weakness (B LE grossly 3+/5)         Communication   Communication: No difficulties  Cognition Arousal/Alertness: Awake/alert Behavior During Therapy: WFL for tasks assessed/performed Overall Cognitive Status: Impaired/Different from baseline (most of history obtained from son)                      General Comments      Exercises Other Exercises Other Exercises: Supine ther-ex performed including B ankle pumps, quad sets, hip abd/add, and SLRs. All ther-ex performed x 10 reps. Wrote ther-ex on board and educated pt and family on frequency/intensity of ther-ex. Other Exercises: balance activities performed while seated at EOB. to promote indep sitting balance. Reaching out in all directions along with ant/post weight shifting performed. Pt able to progress from max assist to min assist for EOB seated balance. Needs B hand support on bed.   Assessment/Plan    PT Assessment Patient needs continued PT services  PT Problem List Decreased strength;Decreased balance;Decreased mobility;Pain          PT Treatment Interventions DME instruction;Gait training;Therapeutic activities;Therapeutic exercise;Balance training    PT Goals (Current goals can be found in the Care Plan section)  Acute Rehab PT Goals Patient Stated Goal: to get stronger PT Goal Formulation: With patient Time For Goal Achievement: 10/15/16 Potential to Achieve Goals: Good    Frequency Min 2X/week   Barriers to discharge        Co-evaluation               End of Session Equipment Utilized During Treatment: Gait belt Activity Tolerance: Patient tolerated treatment well Patient left: in bed;with bed alarm set Nurse Communication: Mobility status         Time: 0037-0488 PT Time Calculation (min) (ACUTE ONLY): 38 min   Charges:   PT Evaluation $PT Eval High Complexity: 1 Procedure PT Treatments $Therapeutic Exercise:  8-22 mins $Therapeutic Activity: 8-22 mins   PT G Codes:        Lariya Kinzie 10-13-2016, 12:20 PM Elizabeth Palau, PT, DPT [phone]

## 2016-10-01 NOTE — Progress Notes (Signed)
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  She with some confusion overnight but improved today.  She worked with PT but was unable to stand.     Vitals:   10/01/16 0417 10/01/16 1359  BP: 129/75 124/66  Pulse: (!) 123 98  Resp: 18 20  Temp: 98.1 F (36.7 C) 98.3 F (36.8 C)    I/O last 3 completed shifts: In: 1821 [P.O.:240; I.V.:1216; IV Piggyback:365] Out: 2760 [Urine:585; Stool:2175] No intake/output data recorded.  PE:  Gen: on vent, responds to pain, NG tube in nare  GI: mucous fistulas and ostomy pink patent stool in RUQ, air and stool in ileostomy bag and RUQ fistula, midline incision clean and dry, no erythema   CBC Latest Ref Rng & Units 09/30/2016 09/29/2016 09/27/2016  WBC 3.6 - 11.0 K/uL 14.7(H) 16.5(H) 16.6(H)  Hemoglobin 12.0 - 16.0 g/dL 8.0(L) 8.2(L) 10.0(L)  Hematocrit 35.0 - 47.0 % 24.7(L) 24.6(L) 30.1(L)  Platelets 150 - 440 K/uL 274 290 320   CMP Latest Ref Rng & Units 10/01/2016 09/30/2016 09/29/2016  Glucose 65 - 99 mg/dL 629(U) 765(Y) 650(P)  BUN 6 - 20 mg/dL 54(S) 56(C) 12(X)  Creatinine 0.44 - 1.00 mg/dL 5.17 0.01 7.49  Sodium 135 - 145 mmol/L 138 141 142  Potassium 3.5 - 5.1 mmol/L 3.4(L) 3.1(L) 3.8  Chloride 101 - 111 mmol/L 106 109 107  CO2 22 - 32 mmol/L 29 30 32  Calcium 8.9 - 10.3 mg/dL 7.1(L) 6.8(L) 7.1(L)  Total Protein 6.5 - 8.1 g/dL - - -  Total Bilirubin 0.3 - 1.2 mg/dL - - -  Alkaline Phos 38 - 126 U/L - - -  AST 15 - 41 U/L - - -  ALT 14 - 54 U/L - - -     Assessment/Plan: 70 year old female with likely diverticular colonic obstruction and rupture POD# 10 and 6 from damage control laparotomy and then subsequent take back to the OR washout 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  Pain: Scheduled tylenol and toradol for baseline pain, prn  diluadid  Res: Extubated, pulm toilet Cardio:  She has been hemodynamical normal Abd: removed penrose today good output from ostomies, continue soft diet GU: continue foley for strict I&O monitoring PT consult for deconditioning Likely SNF placement next week

## 2016-10-01 NOTE — Progress Notes (Signed)
Speech Therapy Note: reviewed chart notes; consulted w/ pt and Son in room. Both denied any swallowing issues at this time stating things seem to be "fine" now. ST services will be available for further consult if needed. Both agreed. NSG updated.   Jerilynn Som, MS, CCC-SLP

## 2016-10-01 NOTE — Clinical Social Work Note (Signed)
CSW has submitted request for prior authorization for STR to medicare blue medic[employer]. Auth pending. York Spaniel MSW,LCSW [phone]

## 2016-10-01 NOTE — Care Management Important Message (Signed)
Important Message  Patient Details  Name: Ruth Gray MRN: [id number] Date of Birth: [birth year]   Medicare Important Message Given:  Yes    Chapman Fitch, RN 10/01/2016, 2:03 PM

## 2016-10-01 NOTE — NC FL2 (Signed)
[place] MEDICAID FL2 LEVEL OF CARE SCREENING TOOL     IDENTIFICATION  Patient Name: Ruth Gray Birthdate: June 04, 1946 Sex: female Admission Date (Current Location): 09/19/2016  [place] and [place] Number:  Chiropodist and Address:  [facility], [address] [place], [place] [address]      Provider Number: [id number]  Attending Physician Name and Address:  Katha Hamming, MD  Relative Name and Phone Number:       Current Level of Care: Hospital Recommended Level of Care: Skilled Nursing Facility Prior Approval Number:    Date Approved/Denied:   PASRR Number: [id number]  Discharge Plan: SNF    Current Diagnoses: Patient Active Problem List   Diagnosis Date Noted  . Acute respiratory failure (HCC)   . Perforation of cecum due to diverticulitis   . SBO (small bowel obstruction)   . Diverticulitis of large intestine without perforation or abscess without bleeding   . Acute diverticulitis 09/19/2016  . Abdominal pain 09/19/2016  . Intractable nausea and vomiting 09/19/2016  . Rheumatoid arthritis (HCC) 09/19/2016    Orientation RESPIRATION BLADDER Height & Weight     Self, Time, Situation, Place  Normal Continent Weight: 247 lb 8 oz (112.3 kg) Height:  5\' 7"  (170.2 cm)  BEHAVIORAL SYMPTOMS/MOOD NEUROLOGICAL BOWEL NUTRITION STATUS   (none)  (none) Colostomy Diet (dysphagia 3)  AMBULATORY STATUS COMMUNICATION OF NEEDS Skin   Limited Assist Verbally Surgical wounds                       Personal Care Assistance Level of Assistance  Bathing, Dressing Bathing Assistance: Limited assistance   Dressing Assistance: Limited assistance     Functional Limitations Info             SPECIAL CARE FACTORS FREQUENCY  PT (By licensed PT)                    Contractures Contractures Info: Not present    Additional Factors Info  Code Status, Allergies Code Status Info: full Allergies Info: cipro, tramadol            Current Medications (10/01/2016):  This is the current hospital active medication list Current Facility-Administered Medications  Medication Dose Route Frequency Provider Last Rate Last Dose  . acetaminophen (TYLENOL) tablet 650 mg  650 mg Oral Q6H Vishal Mungal, MD   650 mg at 10/01/16 0856  . dextrose 5 % in lactated ringers infusion   Intravenous Continuous 13/10/17, NP 75 mL/hr at 10/01/16 0615    . enoxaparin (LOVENOX) injection 40 mg  40 mg Subcutaneous Q24H 13/10/17, MD   40 mg at 10/01/16 1014  . famotidine (PEPCID) tablet 20 mg  20 mg Oral BID 13/10/17, RPH   20 mg at 10/01/16 1014  . feeding supplement (ENSURE ENLIVE) (ENSURE ENLIVE) liquid 237 mL  237 mL Oral TID BM Vishal Mungal, MD   237 mL at 10/01/16 1014  . haloperidol lactate (HALDOL) injection 5 mg  5 mg Intravenous Q6H PRN 13/10/17, MD   5 mg at 10/01/16 0140  . ketorolac (TORADOL) 15 MG/ML injection 15 mg  15 mg Intravenous Q6H 13/10/17, MD   15 mg at 10/01/16 0615  . MEDLINE mouth rinse  15 mL Mouth Rinse BID Vishal Mungal, MD   15 mL at 10/01/16 1020  . meropenem (MERREM) IVPB SOLR 1 g  1 g Intravenous Q8H 13/10/17, MD  1 g at 10/01/16 0615  . ondansetron (ZOFRAN) tablet 4 mg  4 mg Oral Q6H PRN Marguarite Arbour, MD       Or  . ondansetron [facility]) injection 4 mg  4 mg Intravenous Q6H PRN Marguarite Arbour, MD   4 mg at 09/29/16 1108     Discharge Medications: Please see discharge summary for a list of discharge medications.  Relevant Imaging Results:  Relevant Lab Results:   Additional Information SS: [id number]  York Spaniel, LCSW

## 2016-10-01 NOTE — Clinical Social Work Note (Signed)
Clinical Social Work Assessment  Patient Details  Name: Ruth Gray MRN: [id number] Date of Birth: [birth year]  Date of referral:  10/01/16               Reason for consult:  Facility Placement                Permission sought to share information with:  Facility Medical sales representative, Family Supports Permission granted to share information::  Yes, Verbal Permission Granted  Name::        Agency::     Relationship::     Contact Information:     Housing/Transportation Living arrangements for the past 2 months:  Single Family Home Source of Information:  Adult Children Patient Interpreter Needed:  None Criminal Activity/Legal Involvement Pertinent to Current Situation/Hospitalization:  No - Comment as needed Significant Relationships:  Adult Children Lives with:  Adult Children Do you feel safe going back to the place where you live?  Yes Need for family participation in patient care:  Yes (Comment)  Care giving concerns:  Patient's son lives with her however he is gone during the day and patient will require strengthening in her mobility.   Social Worker assessment / plan:  CSW spoke with patient and one of her sons: Ruth Gray [phone]) this morning. CSW explained role and purpose of visit. PT had assessed patient this morning and recommending short term rehab. Patient also has a new colostomy that she will need to receive teaching until she can manage herself. Patient had verbalized to PT and to RN CM in ICU that she was in agreement with rehab at discharge. During CSW visit patient was tired and did not participate in the conversation (PT had just finished working with her). Ruth Gray stated that his mother is in agreement for rehab. He stated that his other brother lives with their mom but that he is only there at night. He expressed that she has been to [facility] in the past and does not wish to return there. CSW will initiate bedsearch.   Employment status:  Retired Glass blower/designer PT Recommendations:  Skilled Nursing Facility Information / Referral to community resources:     Patient/Family's Response to care:  Patient's son expressed appreciation for CSW assistance.  Patient/Family's Understanding of and Emotional Response to Diagnosis, Current Treatment, and Prognosis:  Patient previously had vocalized to staff she was in agreement for rehab at discharge but was too lethargic during CSW visit to participate in conversation. Patient's son is aware that patient will require more care so that she can get back to being independent.   Emotional Assessment Appearance:  Appears stated age Attitude/Demeanor/Rapport:  Lethargic (pleasant) Affect (typically observed):  Accepting, Adaptable, Calm Orientation:  Oriented to Self, Oriented to Place, Oriented to Situation Alcohol / Substance use:  Not Applicable Psych involvement (Current and /or in the community):  No (Comment)  Discharge Needs  Concerns to be addressed:  Care Coordination Readmission within the last 30 days:  No Current discharge risk:  None Barriers to Discharge:  No Barriers Identified   York Spaniel, LCSW 10/01/2016, 12:50 PM

## 2016-10-01 NOTE — Progress Notes (Signed)
[facility] - [place] at [facility]   PATIENT NAME: Ruth Gray    MR#:  [id number]  DATE OF BIRTH:  1946-10-13  SUBJECTIVE: Transferred from ICU after extubation. Patient says that she is doing better today, had some breakfast.  noFever.   CHIEF COMPLAINT:   Chief Complaint  Patient presents with  . Emesis  . Abdominal Pain    REVIEW OF SYSTEMS:   ROS CONSTITUTIONAL: No fever, fatigue or weakness.  EYES: No blurred or double vision.  EARS, NOSE, AND THROAT: No tinnitus or ear pain.  RESPIRATORY: No cough, shortness of breath, wheezing or hemoptysis.  CARDIOVASCULAR: No chest pain, orthopnea, edema.  GASTROINTESTINAL:Postoperative abdominal pain.   GENITOURINARY: No dysuria, hematuria.  ENDOCRINE: No polyuria, nocturia,  HEMATOLOGY: No anemia, easy bruising or bleeding SKIN: No rash or lesion. MUSCULOSKELETAL: No joint pain or arthritis.   NEUROLOGIC: No tingling, numbness, weakness.  PSYCHIATRY: No anxiety or depression.   DRUG ALLERGIES:   Allergies  Allergen Reactions  . Ciprofloxacin Swelling  . Tramadol Nausea And Vomiting  . Penicillins Swelling and Rash    Has patient had a PCN reaction causing immediate rash, facial/tongue/throat swelling, SOB or lightheadedness with hypotension: {no Has patient had a PCN reaction causing severe rash involving mucus membranes or skin necrosis: no Has patient had a PCN reaction that required hospitalization no Has patient had a PCN reaction occurring within the last 10 years: no If all of the above answers are "NO", then may proceed with Cephalosporin use.     VITALS:  Blood pressure 129/75, pulse (!) 123, temperature 98.1 F (36.7 C), temperature source Oral, resp. rate 18, height 5\' 7"  (1.702 m), weight 112.3 kg (247 lb 8 oz), SpO2 95 %.  PHYSICAL EXAMINATION:  GENERAL:  70 y.o.-year-old patient lying in the bed with no acute distress.  EYES: Pupils equal, round, reactive to light and  accommodation. No scleral icterus. Extraocular muscles intact.  HEENT: Head atraumatic, normocephalic. Oropharynx and nasopharynx clear.  NECK:  Supple, no jugular venous distention. No thyroid enlargement, no tenderness.  LUNGS: Normal breath sounds bilaterally, no wheezing, rales,rhonchi or crepitation. No use of accessory muscles of respiration.  CARDIOVASCULAR: S1, S2 normal. No murmurs, rubs, or gallops.  ABDOMEN: Postoperative dressing present patient has fistulas and ostomy in the right upper quadrant, stool in the ileostomy bag. Incision site looks clean . EXTREMITIES: Bilateral pedal edema present  NEUROLOGIC: Cranial nerves II through XII are intact. Muscle strength 5/5 in all extremities. Sensation intact. Gait not checked.  PSYCHIATRIC: The patient is alert and oriented x 3.  SKIN: No obvious rash, lesion, or ulcer.    LABORATORY PANEL:   CBC  Recent Labs Lab 09/30/16 0433  WBC 14.7*  HGB 8.0*  HCT 24.7*  PLT 274   ------------------------------------------------------------------------------------------------------------------  Chemistries   Recent Labs Lab 10/01/16 0425  NA 138  K 3.4*  CL 106  CO2 29  GLUCOSE 116*  BUN 21*  CREATININE 0.58  CALCIUM 7.1*  MG 2.1   ------------------------------------------------------------------------------------------------------------------  Cardiac Enzymes No results for input(s): TROPONINI in the last 168 hours. ------------------------------------------------------------------------------------------------------------------  RADIOLOGY:  No results found.  EKG:   Orders placed or performed during the hospital encounter of 09/19/16  . ED EKG  . ED EKG  . EKG 12-Lead  . EKG 12-Lead  . EKG 12-Lead  . EKG 12-Lead    ASSESSMENT AND PLAN:  #70 year old female patient with a diverticular  Colonic obstruction, perforation status post exploratory laparoscopy, intubation, extubation,  postop day 10,mucous  fistulas, ileostomy.; Already following, continue IV meropenem, WBC is down. Continue clear liquids. Continue Penrose drains as per surgery recommendation. Continue to monitor Foley for strict input and output, continue IV hydration, physical therapy consult.  .   All the records are reviewed and case discussed with Care Management/Social Workerr. Management plans discussed with the patient, family and they are in agreement.  CODE STATUS: full TOTAL TIME TAKING CARE OF THIS PATIENT: 35 minutes.   POSSIBLE D/C IN 1-2 DAYS, DEPENDING ON CLINICAL CONDITION.   Katha Hamming M.D on 10/01/2016 at 10:23 AM  Between 7am to 6pm - Pager - [phone]  After 6pm go to [email or web address] - password [email or web address] [facility]  [place] [facility]  Office  [phone]  CC: Primary care physician; FITZGERALD, DAVID Demetrius Charity, MD   Note: This dictation was prepared with Dragon dictation along with smaller phrase technology. Any transcriptional errors that result from this process are unintentional.

## 2016-10-02 LAB — CBC
HCT: 24.6 % — ABNORMAL LOW (ref 35.0–47.0)
Hemoglobin: 8.2 g/dL — ABNORMAL LOW (ref 12.0–16.0)
MCH: 31.9 pg (ref 26.0–34.0)
MCHC: 33.1 g/dL (ref 32.0–36.0)
MCV: 96.1 fL (ref 80.0–100.0)
PLATELETS: 339 10*3/uL (ref 150–440)
RBC: 2.56 MIL/uL — AB (ref 3.80–5.20)
RDW: 15.6 % — AB (ref 11.5–14.5)
WBC: 16.8 10*3/uL — AB (ref 3.6–11.0)

## 2016-10-02 LAB — BASIC METABOLIC PANEL
ANION GAP: 2 — AB (ref 5–15)
BUN: 21 mg/dL — ABNORMAL HIGH (ref 6–20)
CALCIUM: 7.1 mg/dL — AB (ref 8.9–10.3)
CO2: 28 mmol/L (ref 22–32)
Chloride: 107 mmol/L (ref 101–111)
Creatinine, Ser: 0.63 mg/dL (ref 0.44–1.00)
GLUCOSE: 101 mg/dL — AB (ref 65–99)
POTASSIUM: 3.7 mmol/L (ref 3.5–5.1)
SODIUM: 137 mmol/L (ref 135–145)

## 2016-10-02 MED ORDER — MEROPENEM-SODIUM CHLORIDE 1 GM/50ML IV SOLR
1.0000 g | Freq: Three times a day (TID) | INTRAVENOUS | Status: DC
  Administered 2016-10-02 – 2016-10-13 (×33): 1 g via INTRAVENOUS
  Filled 2016-10-02 (×38): qty 50

## 2016-10-02 MED ORDER — SODIUM CHLORIDE 0.9 % IV SOLN
1.0000 g | Freq: Three times a day (TID) | INTRAVENOUS | Status: DC
  Filled 2016-10-02 (×3): qty 1

## 2016-10-02 NOTE — Clinical Social Work Note (Signed)
CSW visited patient and her son at bedside to discuss bed offers for STR. The patient and her son chose [facility] for STR. CSW noted in [place]. CSW will con't to follow.  Ruth Gray, MSW, LCSW-A [phone]

## 2016-10-02 NOTE — Progress Notes (Signed)
Pharmacy Antibiotic Note  Ruth Gray is a 70 y.o. 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  Pharmacy has been consulted for electrolyte monitoring. Patient currently has poor PO intake.  Plan:  Electrolytes WNL today, no supplementation. Will recheck potassium tomorrow AM.   Height: 5\' 7"  (170.2 cm) Weight: 247 lb 8 oz (112.3 kg) IBW/kg (Calculated) : 61.6  Temp (24hrs), Avg:98.3 F (36.8 C), Min:98.3 F (36.8 C), Max:98.4 F (36.9 C)   Recent Labs Lab 09/26/16 0428 09/27/16 0420 09/28/16 0431 09/29/16 0312 09/29/16 0840 09/30/16 0433 09/30/16 0614 10/01/16 0425 10/02/16 0552  WBC 17.3* 16.6*  --   --  16.5* 14.7*  --   --  16.8*  CREATININE 0.77 0.62 0.64 0.62  --   --  0.58 0.58 0.63    Estimated Creatinine Clearance: 85.8 mL/min (by C-G formula based on SCr of 0.63 mg/dL).    Allergies  Allergen Reactions  . Ciprofloxacin Swelling  . Tramadol Nausea And Vomiting  . Penicillins Swelling and Rash    Has patient had a PCN reaction causing immediate rash, facial/tongue/throat swelling, SOB or lightheadedness with hypotension: {no Has patient had a PCN reaction causing severe rash involving mucus membranes or skin necrosis: no Has patient had a PCN reaction that required hospitalization no Has patient had a PCN reaction occurring within the last 10 years: no If all of the above answers are "NO", then may proceed with Cephalosporin use.      Thank you for allowing pharmacy to be a part of this patient's care.  13/11/17, PharmD Clinical Pharmacist  10/02/2016 7:56 AM

## 2016-10-02 NOTE — Progress Notes (Signed)
[facility] - [place] at [facility]   PATIENT NAME: Ruth Gray    MR#:  [id number]  DATE OF BIRTH:  26-Aug-1946  SUBJECTIVE:  Doing very well considering surgery and post op hypotension  REVIEW OF SYSTEMS:    Review of Systems  Constitutional: Negative.  Negative for chills, fever and malaise/fatigue.  HENT: Negative.  Negative for ear discharge, ear pain, hearing loss, nosebleeds and sore throat.   Eyes: Negative.  Negative for blurred vision and pain.  Respiratory: Negative.  Negative for cough, hemoptysis, shortness of breath and wheezing.   Cardiovascular: Negative.  Negative for chest pain, palpitations and leg swelling.  Gastrointestinal: Positive for abdominal pain. Negative for blood in stool, diarrhea, nausea and vomiting.  Genitourinary: Negative.  Negative for dysuria.  Musculoskeletal: Negative.  Negative for back pain.  Skin: Negative.   Neurological: Negative for dizziness, tremors, speech change, focal weakness, seizures and headaches.  Endo/Heme/Allergies: Negative.  Does not bruise/bleed easily.  Psychiatric/Behavioral: Negative.  Negative for depression, hallucinations and suicidal ideas.    Tolerating Diet:yes       DRUG ALLERGIES:   Allergies  Allergen Reactions  . Ciprofloxacin Swelling  . Tramadol Nausea And Vomiting  . Penicillins Swelling and Rash    Has patient had a PCN reaction causing immediate rash, facial/tongue/throat swelling, SOB or lightheadedness with hypotension: {no Has patient had a PCN reaction causing severe rash involving mucus membranes or skin necrosis: no Has patient had a PCN reaction that required hospitalization no Has patient had a PCN reaction occurring within the last 10 years: no If all of the above answers are "NO", then may proceed with Cephalosporin use.     VITALS:  Blood pressure 119/63, pulse (!) 101, temperature 98.4 F (36.9 C), temperature source Oral, resp. rate 18, height 5\' 7"  (1.702 m),  weight 112.3 kg (247 lb 8 oz), SpO2 95 %.  PHYSICAL EXAMINATION:   Physical Exam    LABORATORY PANEL:   CBC  Recent Labs Lab 10/02/16 0552  WBC 16.8*  HGB 8.2*  HCT 24.6*  PLT 339   ------------------------------------------------------------------------------------------------------------------  Chemistries   Recent Labs Lab 10/01/16 0425 10/02/16 0552  NA 138 137  K 3.4* 3.7  CL 106 107  CO2 29 28  GLUCOSE 116* 101*  BUN 21* 21*  CREATININE 0.58 0.63  CALCIUM 7.1* 7.1*  MG 2.1  --    ------------------------------------------------------------------------------------------------------------------  Cardiac Enzymes No results for input(s): TROPONINI in the last 168 hours. ------------------------------------------------------------------------------------------------------------------  RADIOLOGY:  No results found.   ASSESSMENT AND PLAN:   70 year old female initially presented with abdominal pain and subsequently had diverticular colonic obstruction and perforation status post laparotomy.  1. Diverticular colonic obstruction and perforation postoperative day #11: Management as per surgery Continue meropenem 2. History of rheumatoid arthritis: All oral medications on hold for now.      Management plans discussed with the patient and she is in agreement.  CODE STATUS: full  TOTAL TIME TAKING CARE OF THIS PATIENT: 24 minutes.  D/w surgery   POSSIBLE D/C 2-4 days, DEPENDING ON CLINICAL CONDITION.   Ettore Trebilcock M.D on 10/02/2016 at 11:42 AM  Between 7am to 6pm - Pager - [phone] After 6pm go to [email or web address] - [email or web address]  [facility] [place]  Office  [phone]  CC: Primary care physician; FITZGERALD, DAVID [phone], MD  Note: This dictation was prepared with Dragon dictation along with smaller phrase technology. Any transcriptional errors that result from this process are unintentional.

## 2016-10-02 NOTE — Progress Notes (Signed)
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  She is even more coherent today.  She states pain well controlled, no complaints today.   Vitals:   10/02/16 0606 10/02/16 1218  BP: 119/63 120/62  Pulse: (!) 101 98  Resp: 18 18  Temp: 98.4 F (36.9 C) 98.5 F (36.9 C)    I/O last 3 completed shifts: In: 2037.5 [P.O.:300; I.V.:1687.5; IV Piggyback:50] Out: 3375 [Urine:550; Stool:2825] Total I/O In: 120 [P.O.:120] Out: 550 [Urine:100; Stool:450]  PE:  Gen: on vent, responds to pain, NG tube in nare  GI: mucous fistulas and ostomy pink patent stool in RUQ, air and stool in ileostomy bag and RUQ fistula, midline incision clean and dry, no erythema   CBC Latest Ref Rng & Units 10/02/2016 09/30/2016 09/29/2016  WBC 3.6 - 11.0 K/uL 16.8(H) 14.7(H) 16.5(H)  Hemoglobin 12.0 - 16.0 g/dL 8.2(L) 8.0(L) 8.2(L)  Hematocrit 35.0 - 47.0 % 24.6(L) 24.7(L) 24.6(L)  Platelets 150 - 440 K/uL 339 274 290   CMP Latest Ref Rng & Units 10/02/2016 10/01/2016 09/30/2016  Glucose 65 - 99 mg/dL 811(B) 147(W) 295(A)  BUN 6 - 20 mg/dL 21(H) 08(M) 57(Q)  Creatinine 0.44 - 1.00 mg/dL 4.69 6.29 5.28  Sodium 135 - 145 mmol/L 137 138 141  Potassium 3.5 - 5.1 mmol/L 3.7 3.4(L) 3.1(L)  Chloride 101 - 111 mmol/L 107 106 109  CO2 22 - 32 mmol/L 28 29 30   Calcium 8.9 - 10.3 mg/dL 7.1(L) 7.1(L) 6.8(L)  Total Protein 6.5 - 8.1 g/dL - - -  Total Bilirubin 0.3 - 1.2 mg/dL - - -  Alkaline Phos 38 - 126 U/L - - -  AST 15 - 41 U/L - - -  ALT 14 - 54 U/L - - -     Assessment/Plan: 70 year old female with likely diverticular colonic obstruction and rupture POD# 11 and 7 from damage control laparotomy and then subsequent take back to the OR washout 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  Pain: continue Scheduled tylenol and  toradol for baseline pain, prn diluadid  Abd: continue soft diet, dry dressing to midline BID GU: continue foley for strict I&O monitoring PT consult for deconditioning Likely SNF placement next week

## 2016-10-03 LAB — POTASSIUM: POTASSIUM: 4 mmol/L (ref 3.5–5.1)

## 2016-10-03 MED ORDER — FUROSEMIDE 10 MG/ML IJ SOLN
20.0000 mg | Freq: Once | INTRAMUSCULAR | Status: AC
  Administered 2016-10-03: 20 mg via INTRAVENOUS
  Filled 2016-10-03: qty 2

## 2016-10-03 NOTE — Progress Notes (Signed)
Pharmacy Antibiotic Note  Ruth Gray is a 70 y.o. 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  Pharmacy has been consulted for electrolyte monitoring. Patient currently has poor PO intake.  Plan:  Electrolytes WNL today, no supplementation. Continue to follow, will recheck potassium 11/14  Height: 5\' 7"  (170.2 cm) Weight: 233 lb 11.2 oz (106 kg) IBW/kg (Calculated) : 61.6  Temp (24hrs), Avg:98.8 F (37.1 C), Min:98.5 F (36.9 C), Max:99 F (37.2 C)   Recent Labs Lab 09/27/16 0420 09/28/16 0431 09/29/16 0312 09/29/16 0840 09/30/16 0433 09/30/16 0614 10/01/16 0425 10/02/16 0552  WBC 16.6*  --   --  16.5* 14.7*  --   --  16.8*  CREATININE 0.62 0.64 0.62  --   --  0.58 0.58 0.63    Estimated Creatinine Clearance: 83.2 mL/min (by C-G formula based on SCr of 0.63 mg/dL).    Allergies  Allergen Reactions  . Ciprofloxacin Swelling  . Tramadol Nausea And Vomiting  . Penicillins Swelling and Rash    Has patient had a PCN reaction causing immediate rash, facial/tongue/throat swelling, SOB or lightheadedness with hypotension: {no Has patient had a PCN reaction causing severe rash involving mucus membranes or skin necrosis: no Has patient had a PCN reaction that required hospitalization no Has patient had a PCN reaction occurring within the last 10 years: no If all of the above answers are "NO", then may proceed with Cephalosporin use.      Thank you for allowing pharmacy to be a part of this patient's care.  13/11/17, PharmD Clinical Pharmacist  10/03/2016 8:03 AM

## 2016-10-03 NOTE — Progress Notes (Signed)
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  She has been doing well today.  She has been taking in regular diet. She states pain well controlled.   Vitals:   10/03/16 0502 10/03/16 1300  BP: 113/66 120/68  Pulse: (!) 104 99  Resp: 20 19  Temp: 99 F (37.2 C) 98.7 F (37.1 C)    I/O last 3 completed shifts: In: 3355 [P.O.:180; I.V.:3025; IV Piggyback:150] Out: 2900 [Urine:750; Stool:2150] Total I/O In: 608 [P.O.:240; I.V.:368] Out: 600 [Stool:600]  PE:  Gen: on vent, responds to pain, NG tube in nare  GI: mucous fistulas and ostomy pink patent with air and stool in ileostomy bag and RUQ fistula, midline incision clean and dry, no erythema, serous drainage from removal of penrose  CBC Latest Ref Rng & Units 10/02/2016 09/30/2016 09/29/2016  WBC 3.6 - 11.0 K/uL 16.8(H) 14.7(H) 16.5(H)  Hemoglobin 12.0 - 16.0 g/dL 8.2(L) 8.0(L) 8.2(L)  Hematocrit 35.0 - 47.0 % 24.6(L) 24.7(L) 24.6(L)  Platelets 150 - 440 K/uL 339 274 290   CMP Latest Ref Rng & Units 10/03/2016 10/02/2016 10/01/2016  Glucose 65 - 99 mg/dL - 947(S) 962(E)  BUN 6 - 20 mg/dL - 36(O) 29(U)  Creatinine 0.44 - 1.00 mg/dL - 7.65 4.65  Sodium 035 - 145 mmol/L - 137 138  Potassium 3.5 - 5.1 mmol/L 4.0 3.7 3.4(L)  Chloride 101 - 111 mmol/L - 107 106  CO2 22 - 32 mmol/L - 28 29  Calcium 8.9 - 10.3 mg/dL - 7.1(L) 7.1(L)  Total Protein 6.5 - 8.1 g/dL - - -  Total Bilirubin 0.3 - 1.2 mg/dL - - -  Alkaline Phos 38 - 126 U/L - - -  AST 15 - 41 U/L - - -  ALT 14 - 54 U/L - - -     Assessment/Plan: 70 year old female with likely diverticular colonic obstruction and rupture POD# 12 and 8 from damage control laparotomy and then subsequent take back to the OR washout 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  Pain: continue Scheduled  tylenol and toradol for baseline pain, prn diluadid  Abd: continue soft diet, dry dressing to midline BID GU: continue foley for strict I&O monitoring PT consult for deconditioning Likely SNF placement next week

## 2016-10-03 NOTE — Progress Notes (Signed)
[employer] - [place] [facility]   PATIENT NAME: Ruth Gray    MR#:  [id number]  DATE OF BIRTH:  August 16, 1946  SUBJECTIVE:   No acute issues overnight. Patient ate half of her breakfast this morning. She is sleepy. Her abdominal pain has improved.  REVIEW OF SYSTEMS:    Review of Systems  Constitutional: Negative.  Negative for chills, fever and malaise/fatigue.  HENT: Negative.  Negative for ear discharge, ear pain, hearing loss, nosebleeds and sore throat.   Eyes: Negative.  Negative for blurred vision and pain.  Respiratory: Negative.  Negative for cough, hemoptysis, shortness of breath and wheezing.   Cardiovascular: Positive for leg swelling. Negative for chest pain and palpitations.  Gastrointestinal: Negative for abdominal pain, blood in stool, diarrhea, nausea and vomiting.  Genitourinary: Negative.  Negative for dysuria.  Musculoskeletal: Negative.  Negative for back pain.  Skin: Negative.   Neurological: Negative for dizziness, tremors, speech change, focal weakness, seizures and headaches.  Endo/Heme/Allergies: Negative.  Does not bruise/bleed easily.  Psychiatric/Behavioral: Negative.  Negative for depression, hallucinations and suicidal ideas.    Tolerating Diet:yes       DRUG ALLERGIES:   Allergies  Allergen Reactions  . Ciprofloxacin Swelling  . Tramadol Nausea And Vomiting  . Penicillins Swelling and Rash    Has patient had a PCN reaction causing immediate rash, facial/tongue/throat swelling, SOB or lightheadedness with hypotension: {no Has patient had a PCN reaction causing severe rash involving mucus membranes or skin necrosis: no Has patient had a PCN reaction that required hospitalization no Has patient had a PCN reaction occurring within the last 10 years: no If all of the above answers are "NO", then may proceed with Cephalosporin use.     VITALS:  Blood pressure 113/66, pulse (!) 104, temperature 99 F (37.2 C), temperature  source Oral, resp. rate 20, height 5\' 7"  (1.702 m), weight 106 kg (233 lb 11.2 oz), SpO2 96 %.  PHYSICAL EXAMINATION:   Physical Exam  Constitutional: She is oriented to person, place, and time and well-developed, well-nourished, and in no distress. No distress.  HENT:  Head: Normocephalic.  Eyes: No scleral icterus.  Neck: Normal range of motion. Neck supple. No JVD present. No tracheal deviation present.  Cardiovascular: Normal rate, regular rhythm and normal heart sounds.  Exam reveals no gallop and no friction rub.   No murmur heard. Pulmonary/Chest: Effort normal and breath sounds normal. No respiratory distress. She has no wheezes. She has no rales. She exhibits no tenderness.  Abdominal: Soft. Bowel sounds are normal. She exhibits no distension and no mass. There is no tenderness. There is no rebound and no guarding.  Colostomy bags sutures without drainage/infection   Musculoskeletal: Normal range of motion. She exhibits edema.  Neurological: She is alert and oriented to person, place, and time.  Skin: Skin is warm. No rash noted. No erythema.  Psychiatric: Affect and judgment normal.      LABORATORY PANEL:   CBC  Recent Labs Lab 10/02/16 0552  WBC 16.8*  HGB 8.2*  HCT 24.6*  PLT 339   ------------------------------------------------------------------------------------------------------------------  Chemistries   Recent Labs Lab 10/01/16 0425 10/02/16 0552 10/03/16 0529  NA 138 137  --   K 3.4* 3.7 4.0  CL 106 107  --   CO2 29 28  --   GLUCOSE 116* 101*  --   BUN 21* 21*  --   CREATININE 0.58 0.63  --   CALCIUM 7.1* 7.1*  --   MG  2.1  --   --    ------------------------------------------------------------------------------------------------------------------  Cardiac Enzymes No results for input(s): TROPONINI in the last 168  hours. ------------------------------------------------------------------------------------------------------------------  RADIOLOGY:  No results found.   ASSESSMENT AND PLAN:   70 year old female initially presented with abdominal pain and subsequently had diverticular colonic obstruction and perforation status post laparotomy.  1. Diverticular colonic obstruction and perforation postoperative day #12: Management as per surgery Continue meropenem 2. History of rheumatoid arthritis: All oral medications on hold for now.   3/ LEE: Likely from low albumin and third spacing. Try elevating legs and order Britt Bottom level Try compression hose  Patient doing well almost ready for discharge. I will sign off. Please feel free to call me if you have any questions thinking   Management plans discussed with the patient and she is in agreement.  CODE STATUS: full  TOTAL TIME TAKING CARE OF THIS PATIENT: 24 minutes.  D/w surgery   POSSIBLE D/C 2 days, DEPENDING ON CLINICAL CONDITION.   Zeenat Jeanbaptiste M.D on 10/03/2016 at 11:16 AM  Between 7am to 6pm - Pager - [phone] After 6pm go to [email or web address] - Social research officer, government  [facility]  Office  [phone]  CC: Primary care physician; FITZGERALD, DAVID Demetrius Charity, MD  Note: This dictation was prepared with Dragon dictation along with smaller phrase technology. Any transcriptional errors that result from this process are unintentional.

## 2016-10-04 ENCOUNTER — Encounter: Payer: Self-pay | Admitting: Radiology

## 2016-10-04 ENCOUNTER — Inpatient Hospital Stay: Payer: Medicare Other

## 2016-10-04 LAB — CBC
HCT: 24.7 % — ABNORMAL LOW (ref 35.0–47.0)
Hemoglobin: 8.2 g/dL — ABNORMAL LOW (ref 12.0–16.0)
MCH: 31.4 pg (ref 26.0–34.0)
MCHC: 33 g/dL (ref 32.0–36.0)
MCV: 95.2 fL (ref 80.0–100.0)
PLATELETS: 444 10*3/uL — AB (ref 150–440)
RBC: 2.59 MIL/uL — ABNORMAL LOW (ref 3.80–5.20)
RDW: 17.4 % — AB (ref 11.5–14.5)
WBC: 18.7 10*3/uL — AB (ref 3.6–11.0)

## 2016-10-04 LAB — COMPREHENSIVE METABOLIC PANEL
ALBUMIN: 1.1 g/dL — AB (ref 3.5–5.0)
ALT: 27 U/L (ref 14–54)
ANION GAP: 3 — AB (ref 5–15)
AST: 67 U/L — AB (ref 15–41)
Alkaline Phosphatase: 103 U/L (ref 38–126)
BUN: 22 mg/dL — AB (ref 6–20)
CHLORIDE: 106 mmol/L (ref 101–111)
CO2: 27 mmol/L (ref 22–32)
Calcium: 7.3 mg/dL — ABNORMAL LOW (ref 8.9–10.3)
Creatinine, Ser: 0.78 mg/dL (ref 0.44–1.00)
GFR calc Af Amer: >60 mL/min (ref >60)
GFR calc non Af Amer: >60 mL/min (ref >60)
GLUCOSE: 63 mg/dL — AB (ref 65–99)
POTASSIUM: 4.2 mmol/L (ref 3.5–5.1)
SODIUM: 136 mmol/L (ref 135–145)
TOTAL PROTEIN: 5.1 g/dL — AB (ref 6.5–8.1)
Total Bilirubin: 0.8 mg/dL (ref 0.3–1.2)

## 2016-10-04 MED ORDER — IOPAMIDOL (ISOVUE-300) INJECTION 61%
15.0000 mL | INTRAVENOUS | Status: AC
Start: 2016-10-04 — End: 2016-10-04
  Administered 2016-10-04 (×2): 15 mL via ORAL

## 2016-10-04 MED ORDER — SODIUM CHLORIDE 0.9 % IV SOLN
INTRAVENOUS | Status: DC
  Administered 2016-10-05: 10:00:00 via INTRAVENOUS

## 2016-10-04 MED ORDER — IOPAMIDOL (ISOVUE-300) INJECTION 61%
100.0000 mL | Freq: Once | INTRAVENOUS | Status: AC | PRN
  Administered 2016-10-04: 100 mL via INTRAVENOUS

## 2016-10-04 NOTE — Progress Notes (Signed)
Physical Therapy Treatment Patient Details Name: Ruth Gray MRN: [id number] DOB: May 16, 1946 Today's Date: 10/04/2016    History of Present Illness Pt admitted on 10/29 secondary to acute diverticulitis. Pt with complaints of abdmonial pain with nausea/vomiting. PMH includes RA and smoking. Stay complicated by partial bowel obstruction followed by bowel perforation requiring Sx for colon resection and exploratory laparotomy on 10/31. She then transferred to CCU intubated from [facility], and required further SX for re-exploration on 11/4 with ileostomy placed. Pt then extubated on 09/29/16.     PT Comments    Pt is making limited progress towards goals this date. Pt continues to be motivated to perform therapy, however is very weak and with poor sitting balance. Pt able to sit at EOB and complete balance activities, however fatigues quickly, becomes nauseous, and needs to return supine. While seated, ostomy bag opened and leaked on patient. Once returned supine, RN notified to come re-dress wound. Will continue to progress as able.  Follow Up Recommendations  SNF     Equipment Recommendations       Recommendations for Other Services       Precautions / Restrictions Precautions Precautions: Fall Restrictions Weight Bearing Restrictions: No    Mobility  Bed Mobility Overal bed mobility: Needs Assistance;+2 for physical assistance Bed Mobility: Supine to Sit     Supine to sit: Mod assist;+2 for physical assistance     General bed mobility comments: assist for sequencing and using B hands on railing. Pt able to initiate B LEs off bed. Once seated at EOB, pt needs min assist +2 for sitting balance. Able to sit for approx 5 mintues prior to needing to return supine secondary to nausea and ostomy leaking.  Transfers                 General transfer comment: unable to attempt at this time secondary to pt very nauseous with all mobility and fatigue.  Ambulation/Gait                  Stairs            Wheelchair Mobility    Modified Rankin (Stroke Patients Only)       Balance                                    Cognition Arousal/Alertness: Awake/alert Behavior During Therapy: WFL for tasks assessed/performed Overall Cognitive Status: Within Functional Limits for tasks assessed                      Exercises Other Exercises Other Exercises: Supine ther-ex performed including B LE ankle pumps, quad sets, hip abd/add. SLRs, SAQ, and hip add squeezes. All ther-ex performed x 12 reps with min assist for B LE. Educated on written HEP Other Exercises: balance activities performed while seated at EOB. to promote indep sitting balance. Reaching out in forward directions along with ant/post weight shifting performed. Pt able to progress from min assist to supervision for EOB seated balance. Needs B hand support on bed.    General Comments        Pertinent Vitals/Pain Pain Assessment: No/denies pain    Home Living                      Prior Function            PT Goals (current goals can now  be found in the care plan section) Acute Rehab PT Goals Patient Stated Goal: to get stronger PT Goal Formulation: With patient Time For Goal Achievement: 10/15/16 Potential to Achieve Goals: Good Progress towards PT goals: Progressing toward goals    Frequency    Min 2X/week      PT Plan Current plan remains appropriate    Co-evaluation             End of Session   Activity Tolerance: Patient tolerated treatment well Patient left: in bed;with bed alarm set;with nursing/sitter in room;with family/visitor present     Time: 9758-8325 PT Time Calculation (min) (ACUTE ONLY): 28 min  Charges:  $Therapeutic Exercise: 8-22 mins $Therapeutic Activity: 8-22 mins                    G Codes:      Safi Culotta October 15, 2016, 11:03 AM  Elizabeth Palau, PT, DPT [phone]

## 2016-10-04 NOTE — Progress Notes (Signed)
Pt seen , limited examination performed. Patient was sleep and resting very comfortable For IR Drain placement tomorrow D/w nursing staff, no complaints VSS  PE NAD Abd: soft, no peritonitis  A/P continue current care No need for emergent surgical intervention

## 2016-10-04 NOTE — Progress Notes (Signed)
Pt alert. Short conversation. CH is available.   10/04/16 1310  Clinical Encounter Type  Visited With Patient and family together  Referral From Chaplain  Spiritual Encounters  Spiritual Needs Emotional  Stress Factors  Patient Stress Factors None identified  Family Stress Factors None identified

## 2016-10-04 NOTE — Progress Notes (Signed)
9 Days Post-Op   Subjective:  Patient reports that she is actually feeling better today. She has no complaints of abdominal pain. She has been tolerating a diet without nausea or vomiting.  Vital signs in last 24 hours: Temp:  [98.3 F (36.8 C)-98.7 F (37.1 C)] 98.3 F (36.8 C) (11/13 0531) Pulse Rate:  [99-106] 103 (11/13 0531) Resp:  [19-24] 24 (11/13 0531) BP: (114-120)/(60-68) 114/60 (11/13 0531) SpO2:  [96 %-100 %] 100 % (11/13 0531) Weight:  [107.4 kg (236 lb 11.2 oz)] 107.4 kg (236 lb 11.2 oz) (11/13 0500) Last BM Date: 10/03/16  Intake/Output from previous day: 11/12 0701 - 11/13 0700 In: 708 [P.O.:340; I.V.:368] Out: 1525 [Urine:750; Stool:775]  GI: Abdomen is soft, appropriately tender to palpation along her incisions. 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 There is stool in the ileostomy bag and dark fluid within the fistula bag. Midline incision is well approximated with staples without any evidence of erythema or purulent drainage.  Lab Results:  CBC  Recent Labs  10/02/16 0552 10/04/16 0534  WBC 16.8* 18.7*  HGB 8.2* 8.2*  HCT 24.6* 24.7*  PLT 339 444*   CMP     Component Value Date/Time   NA 136 10/04/2016 0534   K 4.2 10/04/2016 0534   CL 106 10/04/2016 0534   CO2 27 10/04/2016 0534   GLUCOSE 63 (L) 10/04/2016 0534   BUN 22 (H) 10/04/2016 0534   CREATININE 0.78 10/04/2016 0534   CALCIUM 7.3 (L) 10/04/2016 0534   PROT 5.1 (L) 10/04/2016 0534   ALBUMIN 1.1 (L) 10/04/2016 0534   AST 67 (H) 10/04/2016 0534   ALT 27 10/04/2016 0534   ALKPHOS 103 10/04/2016 0534   BILITOT 0.8 10/04/2016 0534   GFRNONAA >60 10/04/2016 0534   GFRAA >60 10/04/2016 0534   PT/INR No results for input(s): LABPROT, INR in the last 72 hours.  Studies/Results: No results found.  Assessment/Plan: 70 year old female postop day 13 from damage control surgery and postop day 9 from maturation of it and ileostomy and mucous fistulas. Clinically doing well.  However her white blood cell count today is continuing to elevate. Although she is without obvious abdominal symptoms she is set up for intra-abdominal abscesses. We will repeat abdominal CT scan today. Continue local wound care. Encourage physical therapy. Possible removal of Foley later today.   Ricarda Frame, MD FACS General Surgeon  10/04/2016

## 2016-10-04 NOTE — Progress Notes (Signed)
  Images reviewed with radiologist. Bilateral abdominal fluid collections consistent with abscess.  Plan for IR drainage today.  Discussed with family who voiced understanding.  Will follow closely.  Ricarda Frame, MD [facility] General Surgeon [facility]

## 2016-10-04 NOTE — Progress Notes (Signed)
Chaplain was making his rounds and visited with pt in room 218. Provided emotional support.    10/04/16 1700  Clinical Encounter Type  Visited With Patient  Visit Type Initial;Spiritual support  Referral From Nurse  Spiritual Encounters  Spiritual Needs Prayer

## 2016-10-05 ENCOUNTER — Inpatient Hospital Stay: Payer: Medicare Other

## 2016-10-05 LAB — PROTIME-INR
INR: 1.16
PROTHROMBIN TIME: 14.9 s (ref 11.4–15.2)

## 2016-10-05 LAB — GLUCOSE, CAPILLARY
GLUCOSE-CAPILLARY: 55 mg/dL — AB (ref 65–99)
GLUCOSE-CAPILLARY: 76 mg/dL (ref 65–99)
Glucose-Capillary: 56 mg/dL — ABNORMAL LOW (ref 65–99)
Glucose-Capillary: <10 mg/dL — CL (ref 65–99)

## 2016-10-05 LAB — APTT: APTT: 45 s — AB (ref 24–36)

## 2016-10-05 MED ORDER — DEXTROSE IN LACTATED RINGERS 5 % IV SOLN
INTRAVENOUS | Status: DC
  Administered 2016-10-05: 22:00:00 via INTRAVENOUS

## 2016-10-05 MED ORDER — DEXTROSE 50 % IV SOLN
50.0000 mL | INTRAVENOUS | Status: DC | PRN
  Administered 2016-10-05 – 2016-10-06 (×2): 50 mL via INTRAVENOUS
  Filled 2016-10-05: qty 50

## 2016-10-05 MED ORDER — DEXTROSE 50 % IV SOLN
INTRAVENOUS | Status: AC
  Filled 2016-10-05: qty 50

## 2016-10-05 MED ORDER — FENTANYL CITRATE (PF) 100 MCG/2ML IJ SOLN
INTRAMUSCULAR | Status: AC | PRN
  Administered 2016-10-05 (×3): 50 ug via INTRAVENOUS
  Administered 2016-10-05: 16:00:00 via INTRAVENOUS

## 2016-10-05 MED ORDER — MIDAZOLAM HCL 2 MG/2ML IJ SOLN
INTRAMUSCULAR | Status: AC | PRN
Start: 2016-10-05 — End: 2016-10-05
  Administered 2016-10-05: 16:00:00 via INTRAVENOUS
  Administered 2016-10-05 (×3): 1 mg via INTRAVENOUS

## 2016-10-05 MED ORDER — DEXTROSE 50 % IV SOLN
12.5000 g | Freq: Once | INTRAVENOUS | Status: AC
  Administered 2016-10-05: 12.5 g via INTRAVENOUS

## 2016-10-05 MED ORDER — FENTANYL CITRATE (PF) 100 MCG/2ML IJ SOLN
INTRAMUSCULAR | Status: AC
  Filled 2016-10-05: qty 4

## 2016-10-05 MED ORDER — MIDAZOLAM HCL 5 MG/5ML IJ SOLN
INTRAMUSCULAR | Status: AC
  Filled 2016-10-05: qty 5

## 2016-10-05 NOTE — Clinical Social Work Note (Signed)
Skyelar at [facility] [phone] ext: [phone])called CSW and left message stating that patient was authorized to go to [employer] and to call her back and she would provide auth number. CSW called Andalyn back and had to leave message as well. Auth from [facility] is usually good for 48 hours and then reauth has to be obtained.

## 2016-10-05 NOTE — Plan of Care (Signed)
Problem: Safety: Goal: Ability to remain free from injury will improve Outcome: Progressing Pt has remained safe and free from falls during my care.   Problem: Pain Managment: Goal: General experience of comfort will improve Outcome: Progressing Pt has remained pain free during my shift. She has complained of pain 5/10 in her knees this evening at the time her scheduled tylenol was due.

## 2016-10-05 NOTE — Procedures (Signed)
Under CT guidance, 9F catheters placed into LLQ and right subdiaphragmatic fluid collections. Hooked up to JP bulbs. No immediate complications.

## 2016-10-05 NOTE — Progress Notes (Signed)
S/p drain placement Feeling well VSS Taking PO  PE NAD Abd: soft, ostomies working and viable, no peritonitis, drains in place  Continue current care

## 2016-10-05 NOTE — Progress Notes (Signed)
Physical Therapy Treatment Patient Details Name: Ruth Gray MRN: [id number] DOB: 12-05-1945 Today's Date: 10/05/2016    History of Present Illness Pt admitted on 10/29 secondary to acute diverticulitis. Pt with complaints of abdmonial pain with nausea/vomiting. PMH includes RA and smoking. Stay complicated by partial bowel obstruction followed by bowel perforation requiring Sx for colon resection and exploratory laparotomy on 10/31. She then transferred to CCU intubated from [facility], and required further SX for re-exploration on 11/4 with ileostomy placed. Pt then extubated on 09/29/16.  Pt is s/p procedure on 11/14 for drainage of abscess, JP drain placed    PT Comments    Pt is s/p procedure today and is very lethargic. Needs +2 assist for bed mobility and complains of dizziness with sitting at EOB, only able to tolerate a few minutes. Needs constant assist to maintain sitting balance. Limited endurance with there-ex with multiple cues to say awake and attention to task. Pt prefers to keep head rotated towards R side.  Follow Up Recommendations  SNF     Equipment Recommendations       Recommendations for Other Services       Precautions / Restrictions Precautions Precautions: Fall Restrictions Weight Bearing Restrictions: No    Mobility  Bed Mobility Overal bed mobility: Needs Assistance;+2 for physical assistance Bed Mobility: Supine to Sit     Supine to sit: Max assist;+2 for physical assistance     General bed mobility comments: assist for sequencing and cues for attention to task. HEavy assistance required for sitting at EOB. Unable to sit without min assist for balance. Has increased difficulty following commands this date  Transfers                 General transfer comment: unable to attempt secondary to lethargy and pt complains of severe dizziness with movement  Ambulation/Gait                 Stairs            Wheelchair Mobility     Modified Rankin (Stroke Patients Only)       Balance                                    Cognition Arousal/Alertness: Lethargic Behavior During Therapy: WFL for tasks assessed/performed Overall Cognitive Status: Within Functional Limits for tasks assessed                      Exercises Other Exercises Other Exercises: supine ther-ex performed including B ankle pumps, quad sets, and SLRs. Pt performed all ther-ex x 12 reps. Pt easily fatigues this session, unable to tolerate further ther-ex.    General Comments        Pertinent Vitals/Pain Pain Assessment: No/denies pain    Home Living                      Prior Function            PT Goals (current goals can now be found in the care plan section) Acute Rehab PT Goals Patient Stated Goal: to get stronger PT Goal Formulation: With patient Time For Goal Achievement: 10/15/16 Potential to Achieve Goals: Good Progress towards PT goals: Progressing toward goals    Frequency    Min 2X/week      PT Plan Current plan remains appropriate    Co-evaluation  End of Session Equipment Utilized During Treatment: Gait belt Activity Tolerance: Patient limited by lethargy Patient left: in bed;with bed alarm set;with nursing/sitter in room;with family/visitor present     Time: 8527-7824 PT Time Calculation (min) (ACUTE ONLY): 23 min  Charges:  $Therapeutic Exercise: 8-22 mins $Therapeutic Activity: 8-22 mins                    G Codes:      Olesya Wike 10-10-2016, 5:14 PM  Elizabeth Palau, PT, DPT [phone]

## 2016-10-05 NOTE — Progress Notes (Signed)
CC: Bowel perforation Subjective: Patient without complaints this morning. Denies any abdominal pain. Still says she is weak.  Objective: Vital signs in last 24 hours: Temp:  [97.9 F (36.6 C)-98.4 F (36.9 C)] 97.9 F (36.6 C) (11/14 0631) Pulse Rate:  [98-101] 98 (11/14 0631) Resp:  [18-19] 18 (11/14 0631) BP: (115-124)/(66-70) 115/66 (11/14 0631) SpO2:  [98 %-100 %] 100 % (11/14 0631) Weight:  [108.4 kg (239 lb)] 108.4 kg (239 lb) (11/14 0815) Last BM Date: 10/05/16  Intake/Output from previous day: 11/13 0701 - 11/14 0700 In: 747.5 [P.O.:240; IV Piggyback:507.5] Out: 1450 [Urine:1200; Stool:250] Intake/Output this shift: Total I/O In: -  Out: 150 [Urine:150]  Physical exam:  Gen.: No acute distress Chest: Clear to auscultation Heart: Regular rhythm Abdomen: Soft, purple tender to palpation, nondistended. 2 mucous fistulas and one end ileostomy site intact with ostomy bag. All appear to be functioning properly. Midline staples intact without erythema or purulent drainage.  Lab Results: CBC   Recent Labs  10/04/16 0534  WBC 18.7*  HGB 8.2*  HCT 24.7*  PLT 444*   BMET  Recent Labs  10/03/16 0529 10/04/16 0534  NA  --  136  K 4.0 4.2  CL  --  106  CO2  --  27  GLUCOSE  --  63*  BUN  --  22*  CREATININE  --  0.78  CALCIUM  --  7.3*   PT/INR  Recent Labs  10/05/16 0447  LABPROT 14.9  INR 1.16   ABG No results for input(s): PHART, HCO3 in the last 72 hours.  Invalid input(s): PCO2, PO2  Studies/Results: Ct Abdomen Pelvis W Contrast  Result Date: 10/04/2016 CLINICAL DATA:  70 year old female postop day 13, status post ileostomy. Evaluate for abscess. EXAM: CT ABDOMEN AND PELVIS WITH CONTRAST TECHNIQUE: Multidetector CT imaging of the abdomen and pelvis was performed using the standard protocol following bolus administration of intravenous contrast. CONTRAST:  ISOVUE-300 IOPAMIDOL (ISOVUE-300) INJECTION 61% COMPARISON:  09/21/2016 FINDINGS:  Lower chest: Small pleural effusions are identified with overlying areas of consolidation in atelectasis involving the lower lobe. Hepatobiliary: No focal liver abnormality. The gallbladder appears normal. No biliary dilatation. Pancreas: Unremarkable. No pancreatic ductal dilatation or surrounding inflammatory changes. Spleen: Normal in size without focal abnormality. Adrenals/Urinary Tract: The adrenal glands appear normal. Unremarkable appearance of the kidneys. Catheter is identified within the urinary bladder. Stomach/Bowel: Normal appearance of the stomach. There is mild increase caliber of the small bowel loops which measure up to 3.2 cm, image 51 of series 2. Right lower quadrant ileostomy is identified. 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 Vascular/Lymphatic: Aortic atherosclerosis noted. Multiple sub cm upper abdominal lymph nodes are identified. No pelvic or inguinal adenopathy. Reproductive: Status post hysterectomy. No adnexal masses. Other: There is mesenteric edema and ascites identified within the abdomen and pelvis. Loculated fluid within the left hemi abdomen measure 4.7 x 8.7 x 4.8 cm. There is a large fluid collection within the right hemi abdomen which extends over the dome of liver. This measures approximately 20.8 cm in cranial caudal dimension, image 39 of series 5. Small fluid collection within the right lower quadrant of the abdomen is identified measuring 3.8 cm, image number 45 of series 5. Within the central pelvis there is a interloop fluid collection which measures 3.4 cm, image 73 of series 2. Interloop fluid collection within the left hemi abdomen is also noted measuring 5.4 cm, image  37 of series 2. Musculoskeletal: There is diffuse body wall edema identified. 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 No aggressive lytic or sclerotic bone lesions. IMPRESSION: 1. Postoperative appearance  of the bowel compatible with right lower quadrant ileostomy and mucous fistula formation. 2. Multifocal fluid collections are identified within the abdomen and pelvis compatible with multiple abscesses. 3. Increase caliber of the small bowel loops likely reflecting ileus. 4. Anasarca 5. Bilateral pleural effusions. These results will be called to the ordering clinician or representative by the Radiologist Assistant, and communication documented in the PACS or zVision Dashboard. Electronically Signed   By: Signa Kell M.D.   On: 10/04/2016 12:21    Anti-infectives: Anti-infectives    Start     Dose/Rate Route Frequency Ordered Stop   10/02/16 1400  meropenem (MERREM) IVPB SOLR 1 g     1 g 100 mL/hr over 30 Minutes Intravenous Every 8 hours 10/02/16 0951     10/02/16 0830  meropenem (MERREM) 1 g in sodium chloride 0.9 % 100 mL IVPB  Status:  Discontinued     1 g 200 mL/hr over 30 Minutes Intravenous Every 8 hours 10/02/16 0742 10/02/16 0951   09/23/16 2200  meropenem (MERREM) IVPB SOLR 1 g     1 g over 30 Minutes Intravenous Every 8 hours 09/23/16 1654 10/01/16 2320   09/22/16 1000  meropenem (MERREM) IVPB SOLR 1 g  Status:  Discontinued     1 g over 30 Minutes Intravenous Every 12 hours 09/22/16 0901 09/23/16 1655   09/21/16 1830  meropenem (MERREM) 1 g in sodium chloride 0.9 % 100 mL IVPB  Status:  Discontinued     1 g 200 mL/hr over 30 Minutes Intravenous Every 12 hours 09/21/16 1238 09/22/16 0858   09/21/16 1500  metroNIDAZOLE (FLAGYL) tablet 500 mg  Status:  Discontinued     500 mg Oral Every 8 hours 09/21/16 0909 09/21/16 0910   09/21/16 1500  metroNIDAZOLE (FLAGYL) IVPB 500 mg  Status:  Discontinued     500 mg 100 mL/hr over 60 Minutes Intravenous Every 8 hours 09/21/16 0910 09/21/16 1238   09/19/16 1800  aztreonam (AZACTAM) 2 g in dextrose 5 % 50 mL IVPB  Status:  Discontinued     2 g 100 mL/hr over 30 Minutes Intravenous Every 6 hours 09/19/16 1457 09/21/16 1238   09/19/16 1500   cefTRIAXone (ROCEPHIN) IVPB 2 g  Status:  Discontinued     2 g 100 mL/hr over 30 Minutes Intravenous  Once 09/19/16 1456 09/19/16 1457   09/19/16 1500  metroNIDAZOLE (FLAGYL) IVPB 500 mg  Status:  Discontinued     500 mg 100 mL/hr over 60 Minutes Intravenous Every 8 hours 09/19/16 1457 09/21/16 0909   09/19/16 1445  cefTRIAXone (ROCEPHIN) 2 g in dextrose 5 % 50 mL IVPB  Status:  Discontinued     2 g 100 mL/hr over 30 Minutes Intravenous  Once 09/19/16 1440 09/19/16 1455   09/19/16 1430  cefTRIAXone (ROCEPHIN) 2 g in dextrose 5 % 50 mL IVPB  Status:  Discontinued     2 g 100 mL/hr over 30 Minutes Intravenous  Once 09/19/16 1427 09/19/16 1435   09/19/16 1430  metroNIDAZOLE (FLAGYL) IVPB 500 mg  Status:  Discontinued     500 mg 100 mL/hr over 60 Minutes Intravenous  Once 09/19/16 1427 09/19/16 1435      Assessment/Plan:  70 year old female status post 2 surgeries for colonic perforation. Diagnosed with bilateral abdominal fluid collections  yesterday. On the schedule for interventional radiology for percutaneous drainage as soon as available. Encourage physical activity, incentive spirometer usage, and we will restart diet after drain placement.  Jacquese Cassarino T. Tonita Cong, MD, FACS  10/05/2016

## 2016-10-05 NOTE — Clinical Social Work Note (Signed)
Liya at [facility] called CSW and informed Berkley Harvey for patient to go to [facility] when time is: [id number]. Joseph at [facility] notified. York Spaniel MSW,LCSW [phone]

## 2016-10-05 NOTE — Progress Notes (Signed)
Nutrition Follow-up  DOCUMENTATION CODES:   Not applicable  INTERVENTION:  -Await diet progression post procedure; once diet advanced, continue Ensure supplement, Magic Cup, snacks   NUTRITION DIAGNOSIS:   Inadequate oral intake related to acute illness as evidenced by NPO status.  Continues  GOAL:   Patient will meet greater than or equal to 90% of their needs  MONITOR:   PO intake, Supplement acceptance, Labs, Weight trends, I & O's  REASON FOR ASSESSMENT:   Ventilator    ASSESSMENT:    Plan for IR drain placement today, plan for diet advancement post procedure. Previously pt tolerating Dysphagia III diet with recorded po intake >50% of meals on average +stool via ostomy Labs: reviewed Meds: reviewed  Diet Order:  Diet NPO time specified Except for: Sips with Meds  Skin:  Reviewed, no issues  Last BM:  11/9  Height:   Ht Readings from Last 1 Encounters:  09/21/16 5\' 7"  (1.702 m)    Weight:   Wt Readings from Last 1 Encounters:  10/05/16 239 lb (108.4 kg)   Filed Weights   10/03/16 0500 10/04/16 0500 10/05/16 0815  Weight: 233 lb 11.2 oz (106 kg) 236 lb 11.2 oz (107.4 kg) 239 lb (108.4 kg)    BMI:  Body mass index is 37.43 kg/m.  Estimated Nutritional Needs:   Kcal:  2000-2200 kcals  Protein:  >/= 105 g  Fluid:  >/= 2 L  EDUCATION NEEDS:   Education needs addressed  10/07/16 MS, RD, LDN [phone] Pager  [phone] Weekend/On-Call Pager

## 2016-10-06 ENCOUNTER — Inpatient Hospital Stay: Payer: Medicare Other

## 2016-10-06 DIAGNOSIS — E43 Unspecified severe protein-calorie malnutrition: Secondary | ICD-10-CM | POA: Insufficient documentation

## 2016-10-06 LAB — CBC
HEMATOCRIT: 26.4 % — AB (ref 35.0–47.0)
Hemoglobin: 8.9 g/dL — ABNORMAL LOW (ref 12.0–16.0)
MCH: 32.4 pg (ref 26.0–34.0)
MCHC: 33.6 g/dL (ref 32.0–36.0)
MCV: 96.3 fL (ref 80.0–100.0)
PLATELETS: 511 10*3/uL — AB (ref 150–440)
RBC: 2.74 MIL/uL — ABNORMAL LOW (ref 3.80–5.20)
RDW: 17.8 % — AB (ref 11.5–14.5)
WBC: 19.2 10*3/uL — AB (ref 3.6–11.0)

## 2016-10-06 LAB — MAGNESIUM: MAGNESIUM: 1.9 mg/dL (ref 1.7–2.4)

## 2016-10-06 LAB — COMPREHENSIVE METABOLIC PANEL
ALBUMIN: 1.1 g/dL — AB (ref 3.5–5.0)
ALT: 21 U/L (ref 14–54)
ANION GAP: 4 — AB (ref 5–15)
AST: 52 U/L — ABNORMAL HIGH (ref 15–41)
Alkaline Phosphatase: 132 U/L — ABNORMAL HIGH (ref 38–126)
BILIRUBIN TOTAL: 0.8 mg/dL (ref 0.3–1.2)
BUN: 20 mg/dL (ref 6–20)
CO2: 25 mmol/L (ref 22–32)
Calcium: 6.9 mg/dL — ABNORMAL LOW (ref 8.9–10.3)
Chloride: 105 mmol/L (ref 101–111)
Creatinine, Ser: 0.78 mg/dL (ref 0.44–1.00)
GFR calc non Af Amer: >60 mL/min (ref >60)
GLUCOSE: 82 mg/dL (ref 65–99)
POTASSIUM: 4.1 mmol/L (ref 3.5–5.1)
SODIUM: 134 mmol/L — AB (ref 135–145)
TOTAL PROTEIN: 5.5 g/dL — AB (ref 6.5–8.1)

## 2016-10-06 LAB — GLUCOSE, CAPILLARY
GLUCOSE-CAPILLARY: 90 mg/dL (ref 65–99)
GLUCOSE-CAPILLARY: 107 mg/dL — AB (ref 65–99)
GLUCOSE-CAPILLARY: 108 mg/dL — AB (ref 65–99)
GLUCOSE-CAPILLARY: 124 mg/dL — AB (ref 65–99)
Glucose-Capillary: 120 mg/dL — ABNORMAL HIGH (ref 65–99)
Glucose-Capillary: 140 mg/dL — ABNORMAL HIGH (ref 65–99)
Glucose-Capillary: 56 mg/dL — ABNORMAL LOW (ref 65–99)
Glucose-Capillary: 79 mg/dL (ref 65–99)
Glucose-Capillary: 82 mg/dL (ref 65–99)

## 2016-10-06 LAB — PROTIME-INR
INR: 1.31
PROTHROMBIN TIME: 16.4 s — AB (ref 11.4–15.2)

## 2016-10-06 LAB — CORTISOL-AM, BLOOD: Cortisol - AM: 19.7 ug/dL (ref 6.7–22.6)

## 2016-10-06 LAB — PHOSPHORUS: PHOSPHORUS: 3.2 mg/dL (ref 2.5–4.6)

## 2016-10-06 MED ORDER — VITAL 1.5 CAL PO LIQD
1000.0000 mL | ORAL | Status: DC
  Administered 2016-10-06 – 2016-10-07 (×2): 1000 mL

## 2016-10-06 MED ORDER — DEXTROSE 10 % IV SOLN
INTRAVENOUS | Status: DC
  Administered 2016-10-06 – 2016-10-13 (×9): via INTRAVENOUS

## 2016-10-06 MED ORDER — COSYNTROPIN 0.25 MG IJ SOLR
0.2500 mg | Freq: Once | INTRAMUSCULAR | Status: AC
Start: 2016-10-07 — End: 2016-10-07
  Administered 2016-10-07: 0.25 mg via INTRAVENOUS
  Filled 2016-10-06: qty 0.25

## 2016-10-06 MED ORDER — ALBUMIN HUMAN 25 % IV SOLN
12.5000 g | Freq: Once | INTRAVENOUS | Status: AC
  Administered 2016-10-06: 12.5 g via INTRAVENOUS
  Filled 2016-10-06: qty 50

## 2016-10-06 MED ORDER — CHLORHEXIDINE GLUCONATE 0.12 % MT SOLN
15.0000 mL | Freq: Two times a day (BID) | OROMUCOSAL | Status: DC
  Administered 2016-10-07 – 2016-10-13 (×12): 15 mL via OROMUCOSAL
  Filled 2016-10-06 (×11): qty 15

## 2016-10-06 MED ORDER — JEVITY 1.2 CAL PO LIQD: 1000.0000 mL | ORAL | Status: DC

## 2016-10-06 MED ORDER — DEXTROSE 50 % IV SOLN
12.5000 g | Freq: Once | INTRAVENOUS | Status: AC
  Administered 2016-10-06: 12.5 g via INTRAVENOUS

## 2016-10-06 NOTE — Progress Notes (Signed)
Pharmacy Antibiotic Note  Ruth Gray is a 70 y.o. 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  Pharmacy has been consulted for electrolyte monitoring.  Plan: Electrolytes WNL today (K 4.1, Mg 1.9, Phos 3.2). No supplementation needed at this time.   Will recheck labs on 11/17 with AM labs.  Height: 5\' 7"  (170.2 cm) Weight: 236 lb 9.6 oz (107.3 kg) IBW/kg (Calculated) : 61.6  Temp (24hrs), Avg:98.2 F (36.8 C), Min:98.2 F (36.8 C), Max:98.2 F (36.8 C)   Recent Labs Lab 09/29/16 0840 09/30/16 0433 09/30/16 0614 10/01/16 0425 10/02/16 0552 10/04/16 0534 10/06/16 0412  WBC 16.5* 14.7*  --   --  16.8* 18.7* 19.2*  CREATININE  --   --  0.58 0.58 0.63 0.78 0.78    Estimated Creatinine Clearance: 83.7 mL/min (by C-G formula based on SCr of 0.78 mg/dL).    Allergies  Allergen Reactions  . Ciprofloxacin Swelling  . Tramadol Nausea And Vomiting  . Penicillins Swelling and Rash    Has patient had a PCN reaction causing immediate rash, facial/tongue/throat swelling, SOB or lightheadedness with hypotension: {no Has patient had a PCN reaction causing severe rash involving mucus membranes or skin necrosis: no Has patient had a PCN reaction that required hospitalization no Has patient had a PCN reaction occurring within the last 10 years: no If all of the above answers are "NO", then may proceed with Cephalosporin use.      Thank you for allowing pharmacy to be a part of this patient's care.  10/08/16, PharmD, BCPS Clinical Pharmacist  10/06/2016 7:07 AM

## 2016-10-06 NOTE — Progress Notes (Signed)
At start of shift Pt. stated that she was ready to go to the nursing home, speech was slow at this time. At approx. 20:45 Pt. seemed drowsy, lethargic with AMS. Pt also dyspneic, SAT's 87 on RA. O2 2L via nasal cannula placed and rapid response called. Pt VS were stable except for respirations and SAT's. CBG checked and results were low, MD notified. D50 ampule admin and rechecked, BS up to 55. Pt more alert after med given. Pt. Refuse to drink fluids offered to help raise BS. D5LR ordered. Pt. appetite has been very poor. CBG Q 4hr checks ordered.  At 00:00 CBG check Pt. BS back down to 56. MD made aware, D10 cont. at 50 mls/hr and 12.5mg  D50 ampule ordered. BS 90 at 01:28.  Pt. more responsive at this time.

## 2016-10-06 NOTE — Progress Notes (Signed)
Nutrition Follow-up  DOCUMENTATION CODES:   Severe malnutrition in context of acute illness/injury  INTERVENTION:  -Dietitian consult received for TPN; discussed nutritional poc with MD [place]. Pt with functional GI tract, would recommend insertion of small bore feeding tube (10 french) for initiation of trial of EN in lieu of initiation of TPN. MD agreeable with this plan.  Discussed possibility of NG placement with pt, pt agreeable stating that she is "willing to do whatever it takes to get out of here." Recommend starting Vital 1.5 at rate of 20 ml/hr with goal of 60 ml/hr providing 2160 kcals, 98 g protein and 1095 mL of free water. Pt will likely need additional free water flushes for hydration on follow-up, currently on D10 for hypoglycemia.    NUTRITION DIAGNOSIS:   Inadequate oral intake related to acute illness as evidenced by NPO status.  Being addressed via nutrition support  GOAL:   Patient will meet greater than or equal to 90% of their needs  MONITOR:   PO intake, Supplement acceptance, Labs, Weight trends, I & O's  REASON FOR ASSESSMENT:   Ventilator    ASSESSMENT:   Pt very weak on visit today. Observed pt trying to self-feed, pt with difficulty bringing Ensure to her mouth. Diet advanced to Dysphagia III s/p IR drain placement for bilateral abdominal abscesses yesterday. Pt with poor appetite. Pt eats some of meal tray (some mashed potatoes, some magic cup) with max encouragement. Pt not interested in eating. Pt does not like the Ensure supplement, not interested in any food. Noted per RN, pt reports feeling strangled when eating solid foods as well  +stool output via ostomy, some nausea after breakfast this AM, none after lunch. No vomiting  Pt with episode of hypoglycemia during the night with decreased responsiveness, rapid response called.   Labs: sodium 134; FSBS 55-107 Meds: D10 at 50 ml/hr (408 kcals)  Nutrition-Focused physical exam completed.  Findings are mild to moderate fat depletion, mild to moderate muscle depletion, and moderate edema.    Diet Order:  DIET DYS 3 Room service appropriate? Yes with Assist; Fluid consistency: Thin  Skin:  Reviewed, no issues  Last BM:  11/9  Height:   Ht Readings from Last 1 Encounters:  09/21/16 5\' 7"  (1.702 m)    Weight:   Wt Readings from Last 1 Encounters:  10/06/16 236 lb 9.6 oz (107.3 kg)    Filed Weights   10/04/16 0500 10/05/16 0815 10/06/16 0500  Weight: 236 lb 11.2 oz (107.4 kg) 239 lb (108.4 kg) 236 lb 9.6 oz (107.3 kg)    BMI:  Body mass index is 37.06 kg/m.  Estimated Nutritional Needs:   Kcal:  2000-2200 kcals  Protein:  >/= 105 g  Fluid:  >/= 2 L  EDUCATION NEEDS:   Education needs addressed  10/08/16 MS, RD, LDN [phone] Pager  [phone] Weekend/On-Call Pager

## 2016-10-06 NOTE — Progress Notes (Signed)
11 Days Post-Op   Subjective:  Patient without complaints this morning. She had an episode of decreased responsiveness overnight secondary to hypoglycemia. She states she is not very hungry is not interested in eating.  Vital signs in last 24 hours: Temp:  [98.2 F (36.8 C)-98.3 F (36.8 C)] 98.3 F (36.8 C) (11/15 1222) Pulse Rate:  [103-120] 103 (11/15 1222) Resp:  [20-32] 20 (11/15 1222) BP: (108-145)/(55-102) 110/58 (11/15 1222) SpO2:  [92 %-100 %] 98 % (11/15 1222) FiO2 (%):  [28 %] 28 % (11/14 2129) Weight:  [107.3 kg (236 lb 9.6 oz)] 107.3 kg (236 lb 9.6 oz) (11/15 0500) Last BM Date: 10/06/16  Intake/Output from previous day: 11/14 0701 - 11/15 0700 In: 171 [I.V.:171] Out: 1640 [Urine:500; Drains:980; Stool:160]  GI: Abdomen is soft, appropriately tender to palpation at her incision sites and drain sites. Nondistended. Midline incision well proximally with staples. 3 colostomy bags in place draining appropriate fluid for each. JP drains in the bilateral abdomen draining seropurulent fluid.  Lab Results:  CBC  Recent Labs  10/04/16 0534 10/06/16 0412  WBC 18.7* 19.2*  HGB 8.2* 8.9*  HCT 24.7* 26.4*  PLT 444* 511*   CMP     Component Value Date/Time   NA 134 (L) 10/06/2016 0412   K 4.1 10/06/2016 0412   CL 105 10/06/2016 0412   CO2 25 10/06/2016 0412   GLUCOSE 82 10/06/2016 0412   BUN 20 10/06/2016 0412   CREATININE 0.78 10/06/2016 0412   CALCIUM 6.9 (L) 10/06/2016 0412   PROT 5.5 (L) 10/06/2016 0412   ALBUMIN 1.1 (L) 10/06/2016 0412   AST 52 (H) 10/06/2016 0412   ALT 21 10/06/2016 0412   ALKPHOS 132 (H) 10/06/2016 0412   BILITOT 0.8 10/06/2016 0412   GFRNONAA >60 10/06/2016 0412   GFRAA >60 10/06/2016 0412   PT/INR  Recent Labs  10/05/16 0447 10/06/16 0412  LABPROT 14.9 16.4*  INR 1.16 1.31    Studies/Results: Ct Image Guided Drainage By Percutaneous Catheter  Result Date: 10/05/2016 INDICATION: Bilateral peritoneal fluid collections.  EXAM: CT GUIDED DRAINAGE OF bilateral ABSCESSes. MEDICATIONS: The patient is currently admitted to the hospital and receiving intravenous antibiotics. The antibiotics were administered within an appropriate time frame prior to the initiation of the procedure. ANESTHESIA/SEDATION: 3.0 mg IV Versed 150 mcg IV Fentanyl Moderate Sedation Time:  75 The patient was continuously monitored during the procedure by the interventional radiology nurse under my direct supervision. COMPLICATIONS: None immediate. TECHNIQUE: Informed written consent was obtained from the patient after a thorough discussion of the procedural risks, benefits and alternatives. All questions were addressed. Maximal Sterile Barrier Technique was utilized including caps, mask, sterile gowns, sterile gloves, sterile drape, hand hygiene and skin antiseptic. A timeout was performed prior to the initiation of the procedure. PROCEDURE: The left lower quadrant was prepped with chlorhexidine in a sterile fashion, and a sterile drape was applied covering the operative field. Sterile gloves were used for the procedure. Local anesthesia was provided with 1% Lidocaine. Under CT guidance, 18 gauge needle was directed into left lower quadrant fluid collection. Guidewire was placed followup by progressively larger dilators. Eight French drainage catheter was placed and internally secured. Approximately 120 mL of serous fluid was removed and sample was sent to lab. Catheter was externally secured and hooked up to JP suction bulb drainage. The right upper quadrant of the abdomen was prepped and draped with chlorhexidine. Sterile drape was placed. Local anesthetic was applied. Under CT guidance, 18 gauge needle  was directed into right subdiaphragmatic fluid collection. Guidewire was placed followup by progressively larger dilators. Eight French drainage catheter was placed and internally secured. Approximately foreign and 20 mL of serous fluid was removed and sample was  sent to lab. Catheter was externally secured and hooked up to JP suction bulb drainage. FINDINGS: Follow-up CT imaging demonstrates both fluid collections to be significantly decompressed status post catheter placement. IMPRESSION: Under CT guidance, successful placement of percutaneous drainage catheters into fluid collections in left lower quadrant and right subdiaphragmatic regions. Electronically Signed   By: Lupita Raider, M.D.   On: 10/05/2016 14:56   Ct Image Guided Drainage By Percutaneous Catheter  Result Date: 10/05/2016 INDICATION: Bilateral peritoneal fluid collections. EXAM: CT GUIDED DRAINAGE OF bilateral ABSCESSes. MEDICATIONS: The patient is currently admitted to the hospital and receiving intravenous antibiotics. The antibiotics were administered within an appropriate time frame prior to the initiation of the procedure. ANESTHESIA/SEDATION: 3.0 mg IV Versed 150 mcg IV Fentanyl Moderate Sedation Time:  40 The patient was continuously monitored during the procedure by the interventional radiology nurse under my direct supervision. COMPLICATIONS: None immediate. TECHNIQUE: Informed written consent was obtained from the patient after a thorough discussion of the procedural risks, benefits and alternatives. All questions were addressed. Maximal Sterile Barrier Technique was utilized including caps, mask, sterile gowns, sterile gloves, sterile drape, hand hygiene and skin antiseptic. A timeout was performed prior to the initiation of the procedure. PROCEDURE: The left lower quadrant was prepped with chlorhexidine in a sterile fashion, and a sterile drape was applied covering the operative field. Sterile gloves were used for the procedure. Local anesthesia was provided with 1% Lidocaine. Under CT guidance, 18 gauge needle was directed into left lower quadrant fluid collection. Guidewire was placed followup by progressively larger dilators. Eight French drainage catheter was placed and internally  secured. Approximately 120 mL of serous fluid was removed and sample was sent to lab. Catheter was externally secured and hooked up to JP suction bulb drainage. The right upper quadrant of the abdomen was prepped and draped with chlorhexidine. Sterile drape was placed. Local anesthetic was applied. Under CT guidance, 18 gauge needle was directed into right subdiaphragmatic fluid collection. Guidewire was placed followup by progressively larger dilators. Eight French drainage catheter was placed and internally secured. Approximately foreign and 20 mL of serous fluid was removed and sample was sent to lab. Catheter was externally secured and hooked up to JP suction bulb drainage. FINDINGS: Follow-up CT imaging demonstrates both fluid collections to be significantly decompressed status post catheter placement. IMPRESSION: Under CT guidance, successful placement of percutaneous drainage catheters into fluid collections in left lower quadrant and right subdiaphragmatic regions. Electronically Signed   By: Lupita Raider, M.D.   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 Had IR placed drains yesterday for bilateral abdominal abscesses. Clinically unchanged. Patient has had diminished oral intake. At this point we will restart TPN. Continue local wound care. Encourage physical therapy.   Ricarda Frame, MD FACS General Surgeon  10/06/2016

## 2016-10-06 NOTE — Significant Event (Signed)
Rapid Response Event Note  Overview: Time Called: 2106 Arrival Time: 2108 Event Type: Neurologic  Initial Focused Assessment: Patient lethargic, minimally responsive to voice. Baseline A/O. Bedside RN reports decreased PO intake this evening. CBG reading Low.  Interventions: Vitals checked, CBG checked, oral care done. 1amp D50 given with orders for D5LR @ 19ml/hr per MD.  Pt aroused after 1amp D50, oriented to person, place, and time.   Plan of Care (if not transferred):  Will recheck CBG after D50 and Q4Hours and encourage PO intake.  Event Summary:   On-call surgeon notified at 2110    Outcome: Stayed in room and stabalized  Event End Time: 2123  Ruth Gray

## 2016-10-06 NOTE — Progress Notes (Signed)
Pt's scheduled CBG was taken and was 56. Pt was resistant to drinking juice so received a full amp of D5. Pt was encouraged to drink her scheduled Ensure. With assistance, pt was able to drink complete bottle. CBG recheck was 140. Will continue to monitor.

## 2016-10-06 NOTE — Progress Notes (Signed)
Pt. BS 79 at 0040 check. Result did not upload to [facility]. Pt more responsive.

## 2016-10-06 NOTE — Plan of Care (Signed)
Problem: Safety: Goal: Ability to remain free from injury will improve Outcome: Progressing Pt has remained free from falls during my shift.   Problem: Pain Managment: Goal: General experience of comfort will improve Outcome: Progressing Pt has remained free from pain during my shift.   Comments: Pt has been able to maintain her blood sugars since the morning reading of 56. Pt has to be strongly encouraged to eat meals and items feed must be soft. Pt feels strangled when eating solid foods. Pt is able to drink Ensure and will be encouraged to do so with meals. When asked, the pt states she has no appetite at this time.

## 2016-10-06 NOTE — Consult Note (Signed)
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  She is still very weak.  Son is not at bedside this AM.  Asked for son to come this PM or tomorrow AM for ongoing ostomy education.  Both pouches are intact with stool present in pouch.  Supplies at bedside.  Will change pouches and continue education when family at bedside.  WOC team will follow Maple Hudson RN BSN [facility] Pager [phone]

## 2016-10-07 LAB — BASIC METABOLIC PANEL
ANION GAP: 4 — AB (ref 5–15)
BUN: 17 mg/dL (ref 6–20)
CALCIUM: 6.8 mg/dL — AB (ref 8.9–10.3)
CO2: 26 mmol/L (ref 22–32)
Chloride: 104 mmol/L (ref 101–111)
Creatinine, Ser: 0.72 mg/dL (ref 0.44–1.00)
Glucose, Bld: 107 mg/dL — ABNORMAL HIGH (ref 65–99)
Potassium: 3.9 mmol/L (ref 3.5–5.1)
Sodium: 134 mmol/L — ABNORMAL LOW (ref 135–145)

## 2016-10-07 LAB — MAGNESIUM: Magnesium: 1.7 mg/dL (ref 1.7–2.4)

## 2016-10-07 LAB — ACTH STIMULATION, 3 TIME POINTS
CORTISOL 30 MIN: 10.9 ug/dL
Cortisol, 60 Min: 10.6 ug/dL
Cortisol, Base: 11.5 ug/dL

## 2016-10-07 LAB — GLUCOSE, CAPILLARY
GLUCOSE-CAPILLARY: 100 mg/dL — AB (ref 65–99)
GLUCOSE-CAPILLARY: 108 mg/dL — AB (ref 65–99)
GLUCOSE-CAPILLARY: 92 mg/dL (ref 65–99)
Glucose-Capillary: 105 mg/dL — ABNORMAL HIGH (ref 65–99)
Glucose-Capillary: 108 mg/dL — ABNORMAL HIGH (ref 65–99)
Glucose-Capillary: 99 mg/dL (ref 65–99)

## 2016-10-07 LAB — CBC
HEMATOCRIT: 23.5 % — AB (ref 35.0–47.0)
Hemoglobin: 7.5 g/dL — ABNORMAL LOW (ref 12.0–16.0)
MCH: 31 pg (ref 26.0–34.0)
MCHC: 32 g/dL (ref 32.0–36.0)
MCV: 96.7 fL (ref 80.0–100.0)
PLATELETS: 410 10*3/uL (ref 150–440)
RBC: 2.43 MIL/uL — ABNORMAL LOW (ref 3.80–5.20)
RDW: 17.7 % — AB (ref 11.5–14.5)
WBC: 14.6 10*3/uL — AB (ref 3.6–11.0)

## 2016-10-07 LAB — CYTOLOGY - NON PAP

## 2016-10-07 LAB — PHOSPHORUS: PHOSPHORUS: 2.4 mg/dL — AB (ref 2.5–4.6)

## 2016-10-07 LAB — CORTISOL-AM, BLOOD
CORTISOL - AM: 12 ug/dL (ref 6.7–22.6)
CORTISOL - AM: 20.8 ug/dL (ref 6.7–22.6)

## 2016-10-07 MED ORDER — ALUM & MAG HYDROXIDE-SIMETH 200-200-20 MG/5ML PO SUSP
30.0000 mL | Freq: Four times a day (QID) | ORAL | Status: DC | PRN
  Administered 2016-10-07 – 2016-10-10 (×2): 30 mL via ORAL
  Filled 2016-10-07 (×2): qty 30

## 2016-10-07 MED ORDER — VITAL 1.5 CAL PO LIQD
1000.0000 mL | ORAL | Status: DC
  Administered 2016-10-07 – 2016-10-12 (×4): 1000 mL

## 2016-10-07 MED ORDER — BOOST / RESOURCE BREEZE PO LIQD
1.0000 | Freq: Three times a day (TID) | ORAL | Status: DC
  Administered 2016-10-07 – 2016-10-11 (×5): 1 via ORAL
  Administered 2016-10-11: 10:00:00 via ORAL

## 2016-10-07 MED ORDER — NYSTATIN 100000 UNIT/GM EX POWD
Freq: Two times a day (BID) | CUTANEOUS | Status: DC
  Administered 2016-10-07 – 2016-10-13 (×10): via TOPICAL
  Filled 2016-10-07: qty 15

## 2016-10-07 NOTE — Progress Notes (Signed)
12 Days Post-Op   Subjective:  Patient without complaints this morning. Tolerated placement of feeding tube and initiation of tube feeds. Has not been moving much but is willing to work more with physical therapy.  Vital signs in last 24 hours: Temp:  [98.3 F (36.8 C)-98.7 F (37.1 C)] 98.5 F (36.9 C) (11/16 0540) Pulse Rate:  [103-113] 105 (11/16 0540) Resp:  [16-20] 18 (11/16 0540) BP: (109-110)/(55-58) 109/55 (11/16 0540) SpO2:  [98 %-100 %] 100 % (11/16 0540) Weight:  [107.1 kg (236 lb 1.6 oz)] 107.1 kg (236 lb 1.6 oz) (11/16 0434) Last BM Date: 10/07/16  Intake/Output from previous day: 11/15 0701 - 11/16 0700 In: 2019.5 [P.O.:640; I.V.:1217.5; NG/GT:96; IV Piggyback:66] Out: 1737 [Urine:1055; Drains:52; Stool:630]  GI: Abdomen is soft, nontender, nondistended. 3 ostomy appliances in place with appropriate output in each. Ileostomy in the left lower quadrant with appropriate increase with initiation of tube feeds. Midline staples in place without evidence of erythema or drainage. Bilateral JP drains with serosanguineous output.  Lab Results:  CBC  Recent Labs  10/06/16 0412 10/07/16 0416  WBC 19.2* 14.6*  HGB 8.9* 7.5*  HCT 26.4* 23.5*  PLT 511* 410   CMP     Component Value Date/Time   NA 134 (L) 10/07/2016 0416   K 3.9 10/07/2016 0416   CL 104 10/07/2016 0416   CO2 26 10/07/2016 0416   GLUCOSE 107 (H) 10/07/2016 0416   BUN 17 10/07/2016 0416   CREATININE 0.72 10/07/2016 0416   CALCIUM 6.8 (L) 10/07/2016 0416   PROT 5.5 (L) 10/06/2016 0412   ALBUMIN 1.1 (L) 10/06/2016 0412   AST 52 (H) 10/06/2016 0412   ALT 21 10/06/2016 0412   ALKPHOS 132 (H) 10/06/2016 0412   BILITOT 0.8 10/06/2016 0412   GFRNONAA >60 10/07/2016 0416   GFRAA >60 10/07/2016 0416   PT/INR  Recent Labs  10/05/16 0447 10/06/16 0412  LABPROT 14.9 16.4*  INR 1.16 1.31    Studies/Results: Dg Abd 1 View  Result Date: 10/06/2016 CLINICAL DATA:  Nasogastric tube placement.   Initial encounter. EXAM: ABDOMEN - 1 VIEW COMPARISON:  CT of the abdomen and pelvis performed 10/04/2016 FINDINGS: The patient's enteric tube is noted ending overlying the body of the stomach. The visualized bowel gas pattern is nonspecific. Mildly distended small bowel loops are seen. The stomach is partially filled with air and fluid. Postoperative change is noted along the midline abdominal wall. No acute osseous abnormalities are seen. IMPRESSION: 1. Enteric tube noted ending overlying the body of the stomach. 2. Nonspecific bowel gas pattern. Mildly distended small bowel loops seen. Stomach partially filled with fluid and air. Electronically Signed   By: Roanna Raider M.D.   On: 10/06/2016 20:41   Ct Image Guided Drainage By Percutaneous Catheter  Result Date: 10/05/2016 INDICATION: Bilateral peritoneal fluid collections. EXAM: CT GUIDED DRAINAGE OF bilateral ABSCESSes. MEDICATIONS: The patient is currently admitted to the hospital and receiving intravenous antibiotics. The antibiotics were administered within an appropriate time frame prior to the initiation of the procedure. ANESTHESIA/SEDATION: 3.0 mg IV Versed 150 mcg IV Fentanyl Moderate Sedation Time:  77 The patient was continuously monitored during the procedure by the interventional radiology nurse under my direct supervision. COMPLICATIONS: None immediate. TECHNIQUE: Informed written consent was obtained from the patient after a thorough discussion of the procedural risks, benefits and alternatives. All questions were addressed. Maximal Sterile Barrier Technique was utilized including caps, mask, sterile gowns, sterile gloves, sterile drape, hand hygiene and skin antiseptic.  A timeout was performed prior to the initiation of the procedure. PROCEDURE: The left lower quadrant was prepped with chlorhexidine in a sterile fashion, and a sterile drape was applied covering the operative field. Sterile gloves were used for the procedure. Local  anesthesia was provided with 1% Lidocaine. Under CT guidance, 18 gauge needle was directed into left lower quadrant fluid collection. Guidewire was placed followup by progressively larger dilators. Eight French drainage catheter was placed and internally secured. Approximately 120 mL of serous fluid was removed and sample was sent to lab. Catheter was externally secured and hooked up to JP suction bulb drainage. The right upper quadrant of the abdomen was prepped and draped with chlorhexidine. Sterile drape was placed. Local anesthetic was applied. Under CT guidance, 18 gauge needle was directed into right subdiaphragmatic fluid collection. Guidewire was placed followup by progressively larger dilators. Eight French drainage catheter was placed and internally secured. Approximately foreign and 20 mL of serous fluid was removed and sample was sent to lab. Catheter was externally secured and hooked up to JP suction bulb drainage. FINDINGS: Follow-up CT imaging demonstrates both fluid collections to be significantly decompressed status post catheter placement. IMPRESSION: Under CT guidance, successful placement of percutaneous drainage catheters into fluid collections in left lower quadrant and right subdiaphragmatic regions. Electronically Signed   By: Lupita Raider, M.D.   On: 10/05/2016 14:56   Ct Image Guided Drainage By Percutaneous Catheter  Result Date: 10/05/2016 INDICATION: Bilateral peritoneal fluid collections. EXAM: CT GUIDED DRAINAGE OF bilateral ABSCESSes. MEDICATIONS: The patient is currently admitted to the hospital and receiving intravenous antibiotics. The antibiotics were administered within an appropriate time frame prior to the initiation of the procedure. ANESTHESIA/SEDATION: 3.0 mg IV Versed 150 mcg IV Fentanyl Moderate Sedation Time:  16 The patient was continuously monitored during the procedure by the interventional radiology nurse under my direct supervision. COMPLICATIONS: None  immediate. TECHNIQUE: Informed written consent was obtained from the patient after a thorough discussion of the procedural risks, benefits and alternatives. All questions were addressed. Maximal Sterile Barrier Technique was utilized including caps, mask, sterile gowns, sterile gloves, sterile drape, hand hygiene and skin antiseptic. A timeout was performed prior to the initiation of the procedure. PROCEDURE: The left lower quadrant was prepped with chlorhexidine in a sterile fashion, and a sterile drape was applied covering the operative field. Sterile gloves were used for the procedure. Local anesthesia was provided with 1% Lidocaine. Under CT guidance, 18 gauge needle was directed into left lower quadrant fluid collection. Guidewire was placed followup by progressively larger dilators. Eight French drainage catheter was placed and internally secured. Approximately 120 mL of serous fluid was removed and sample was sent to lab. Catheter was externally secured and hooked up to JP suction bulb drainage. The right upper quadrant of the abdomen was prepped and draped with chlorhexidine. Sterile drape was placed. Local anesthetic was applied. Under CT guidance, 18 gauge needle was directed into right subdiaphragmatic fluid collection. Guidewire was placed followup by progressively larger dilators. Eight French drainage catheter was placed and internally secured. Approximately foreign and 20 mL of serous fluid was removed and sample was sent to lab. Catheter was externally secured and hooked up to JP suction bulb drainage. FINDINGS: Follow-up CT imaging demonstrates both fluid collections to be significantly decompressed status post catheter placement. IMPRESSION: Under CT guidance, successful placement of percutaneous drainage catheters into fluid collections in left lower quadrant and right subdiaphragmatic regions. Electronically Signed   By: Lupita Raider,  M.D.   On: 10/05/2016 14:56     Assessment/Plan: 70 year old female postop day 16 from damage control surgery and postop day 12 frommaturation of it and ileostomy and mucous fistulas. Had IR placed drains two days ago for bilateral abdominal abscesses. Clinically unchanged. Patient has continued diminished oral intake. Started tube feeds at request of nutrition over TPN, tolerated well. Labs much improved today. Encourage physical therapy.    Ricarda Frame, MD FACS General Surgeon  10/07/2016

## 2016-10-07 NOTE — Progress Notes (Signed)
Cortisyn IVP ordered X1; upon investigation, found that Cortisol level was to be drawn prior to administration and f/u level drawn 30' after adminstration; This was done; denies pain overnight; tolerating TF at 35ml/hr via Dobhoff tube; tube resecured to bridge of nose; HOB elevated to 30'; Ileostomy with good output; fistulas draining; Windy Carina, RN 7:01 AM 10/07/2016

## 2016-10-07 NOTE — Progress Notes (Signed)
Nutrition Follow-up  DOCUMENTATION CODES:   Severe malnutrition in context of acute illness/injury  INTERVENTION:  -Recommend continuing to titrate TF as tolerated; new goal of 50 ml/hr at present provides 1800 kcals, 82 g of protein. Pt is taking small amounts of po intake. If po intake does not improve, will adjust goal rate. At goal rate, pt will be receiving less than 2 ounces of volume in stomach every hour,. Pt also receiving kcals from D10 at present.  -Discussed other options for nutritional supplementation with pt and her son; does not like Ensure. Agreeable to trying Boost Breeze supplement instead, orders modified -Discussed pt complaint of gas with Charisse March RN, RN to discuss with MD   NUTRITION DIAGNOSIS:   Inadequate oral intake related to acute illness as evidenced by NPO status.  Being addressed via TF  GOAL:   Patient will meet greater than or equal to 90% of their needs  MONITOR:   PO intake, Supplement acceptance, Labs, Weight trends, I & O's  REASON FOR ASSESSMENT:   Ventilator    ASSESSMENT:   Pt working with PT on visit this AM, reports she is starting to feel better   10 french feeding tube successfully, pt tolerating Vital 1.5 at rate of 30 ml/hr this AM.   Pt continues with poor appetite. Does not want to drink Ensure. Ate some breakfast this AM.  Ileostomy with 630 mL stool; fistulas and drains with minimal output. No vomiting. Pt does complain of gas, burping  Labs: sodium 134, phosphorus 2.4 Meds: D10 at 50 ml/hr(408 kcals)  Diet Order:  DIET DYS 3 Room service appropriate? Yes with Assist; Fluid consistency: Thin  Skin:  Reviewed, no issues  Last BM:  11/16  Height:   Ht Readings from Last 1 Encounters:  09/21/16 5\' 7"  (1.702 m)    Weight:   Wt Readings from Last 1 Encounters:  10/07/16 236 lb 1.6 oz (107.1 kg)    Filed Weights   10/05/16 0815 10/06/16 0500 10/07/16 0434  Weight: 239 lb (108.4 kg) 236 lb 9.6 oz (107.3 kg) 236  lb 1.6 oz (107.1 kg)    BMI:  Body mass index is 36.98 kg/m.  Estimated Nutritional Needs:   Kcal:  2000-2200 kcals  Protein:  >/= 105 g  Fluid:  >/= 2 L  EDUCATION NEEDS:   Education needs addressed  10/09/16 MS, RD, LDN [phone] Pager  [phone] Weekend/On-Call Pager

## 2016-10-07 NOTE — Progress Notes (Signed)
Physical Therapy Treatment Patient Details Name: Ruth Gray MRN: [id number] DOB: 28-Aug-1946 Today's Date: 10/07/2016    History of Present Illness Pt admitted on 10/29 secondary to acute diverticulitis. Pt with complaints of abdmonial pain with nausea/vomiting. PMH includes RA and smoking. Stay complicated by partial bowel obstruction followed by bowel perforation requiring Sx for colon resection and exploratory laparotomy on 10/31. She then transferred to CCU intubated from [facility], and required further SX for re-exploration on 11/4 with ileostomy placed. Pt then extubated on 09/29/16.  Pt is s/p procedure on 11/14 for drainage of abscess, JP drain placed. Tube feeds started on 11/15 secondary to poor nutrition. Pt also with noted rapid response on 11/15 secondary to low glucose.    PT Comments    Pt is making gradual progress towards goals and is limited by poor nutritional intake, causing her to feel weak, per patient. Pt now started on tube feeds and is more alert this session. Pt agreeable to therapy and shows improvement with seated balance activities and tolerance in upright position. Still requires +2 assist for all mobility. Pt refuses to attempt standing trial this date despite encouragement. Gave pt and family written HEP. Will continue to progress.  Follow Up Recommendations  SNF     Equipment Recommendations       Recommendations for Other Services       Precautions / Restrictions Precautions Precautions: Fall Restrictions Weight Bearing Restrictions: No    Mobility  Bed Mobility Overal bed mobility: Needs Assistance;+2 for physical assistance Bed Mobility: Supine to Sit     Supine to sit: Mod assist     General bed mobility comments: assist for initiating movement. Pt able to grab bedrail and assist with moving B LEs off bed. Once seated at EOB, needs mod assist to maintain balance, however able to progress to only requiring cga. Able to sit EOB for extensive amount  of time this date.  Transfers                 General transfer comment: Pt refuses to attempt this date secondary to indigestion. Heavy encouragement performed  Ambulation/Gait                 Stairs            Wheelchair Mobility    Modified Rankin (Stroke Patients Only)       Balance                                    Cognition Arousal/Alertness: Awake/alert Behavior During Therapy: WFL for tasks assessed/performed Overall Cognitive Status: Within Functional Limits for tasks assessed                      Exercises Other Exercises Other Exercises: supine ther-ex performed including B ankle pumps, quad sets, SAQ, hip abd/add, heel slides, and SLRs. Pt performed all ther-ex x 15 reps. Pt needs only min assist for performance and pt appears motivated to participate. Written ther-ex given for continued performance with family in room. Other Exercises: Balance activities performed while seated at EOB. Pt performed reaching outside of BOS in all direction and able to grab pillow case with both hands x 5 reps on each side. Pt fatigues quickly and needs min assist to maintain balance between each rep. Able to progress to only requiring min assist for seated balance. Needs B hand support on bed.  General Comments        Pertinent Vitals/Pain Pain Assessment: Faces Faces Pain Scale: Hurts a little bit Pain Location: indigestion Pain Descriptors / Indicators: Discomfort Pain Intervention(s): Limited activity within patient's tolerance;Repositioned    Home Living                      Prior Function            PT Goals (current goals can now be found in the care plan section) Acute Rehab PT Goals Patient Stated Goal: to get stronger PT Goal Formulation: With patient Time For Goal Achievement: 10/15/16 Potential to Achieve Goals: Good Progress towards PT goals: Progressing toward goals    Frequency    Min  2X/week      PT Plan Current plan remains appropriate    Co-evaluation             End of Session   Activity Tolerance: Patient tolerated treatment well Patient left: in bed;with bed alarm set;with nursing/sitter in room;with family/visitor present     Time: 6010-9323 PT Time Calculation (min) (ACUTE ONLY): 38 min  Charges:  $Therapeutic Exercise: 23-37 mins $Therapeutic Activity: 8-22 mins                    G Codes:      Hillary Struss 29-Oct-2016, 11:06 AM  Elizabeth Palau, PT, DPT [phone]

## 2016-10-07 NOTE — Clinical Social Work Note (Signed)
Dobhoff tube feedings were started today for patient. Patient not yet ready for discharge. Will have to get reauth when patient is ready for discharge. [facility] is aware. York Spaniel MSW,LCSW [phone]

## 2016-10-08 LAB — GLUCOSE, CAPILLARY
GLUCOSE-CAPILLARY: 101 mg/dL — AB (ref 65–99)
GLUCOSE-CAPILLARY: 105 mg/dL — AB (ref 65–99)
Glucose-Capillary: 114 mg/dL — ABNORMAL HIGH (ref 65–99)
Glucose-Capillary: 107 mg/dL — ABNORMAL HIGH (ref 65–99)
Glucose-Capillary: 110 mg/dL — ABNORMAL HIGH (ref 65–99)

## 2016-10-08 LAB — MAGNESIUM: Magnesium: 1.8 mg/dL (ref 1.7–2.4)

## 2016-10-08 LAB — POTASSIUM: Potassium: 3.8 mmol/L (ref 3.5–5.1)

## 2016-10-08 NOTE — Progress Notes (Signed)
Pharmacy Antibiotic Note  Ruth Gray is a 70 y.o. 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  Pharmacy has been consulted for electrolyte monitoring.  Plan: Electrolytes WNL today (K 3.8, Mg 1.8). No supplementation needed at this time. Electrolytes have been WNL for > 7 days and tube feeds were restarted on 11/15.   Will recheck labs on 11/20 AM.  Height: 5\' 7"  (170.2 cm) Weight: 241 lb 4.8 oz (109.5 kg) IBW/kg (Calculated) : 61.6  Temp (24hrs), Avg:98.4 F (36.9 C), Min:98.2 F (36.8 C), Max:98.5 F (36.9 C)   Recent Labs Lab 10/02/16 0552 10/04/16 0534 10/06/16 0412 10/07/16 0416  WBC 16.8* 18.7* 19.2* 14.6*  CREATININE 0.63 0.78 0.78 0.72    Estimated Creatinine Clearance: 84.7 mL/min (by C-G formula based on SCr of 0.72 mg/dL).    Allergies  Allergen Reactions  . Ciprofloxacin Swelling  . Tramadol Nausea And Vomiting  . Penicillins Swelling and Rash    Has patient had a PCN reaction causing immediate rash, facial/tongue/throat swelling, SOB or lightheadedness with hypotension: {no Has patient had a PCN reaction causing severe rash involving mucus membranes or skin necrosis: no Has patient had a PCN reaction that required hospitalization no Has patient had a PCN reaction occurring within the last 10 years: no If all of the above answers are "NO", then may proceed with Cephalosporin use.      Thank you for allowing pharmacy to be a part of this patient's care.  10/09/16, PharmD, BCPS Clinical Pharmacist  10/08/2016 8:02 AM

## 2016-10-08 NOTE — Progress Notes (Signed)
Patient stated that she felt like she had to pee, assess urethral catheter line intact, noted minimal output from foley and sediment in catheter, bladder scanned patient - measurement resulted in 450. Notified prime doc received new order for catheter irrigation, irrigated pt foley catheter and urine output of was returned. Patient stated "I feel better" after catheter was emptied. Pt resting in bed continue to assess.

## 2016-10-08 NOTE — Care Management Important Message (Signed)
Important Message  Patient Details  Name: Ruth Gray MRN: [id number] Date of Birth: [birth year]   Medicare Important Message Given:  Yes    Chapman Fitch, RN 10/08/2016, 9:58 AM

## 2016-10-08 NOTE — Progress Notes (Signed)
13 Days Post-Op   Subjective:  Patient much more alert today. Much more conversant. States she feels like she needs to sit on the commode due to pressure in her pelvis. Continues to need maximal assistance for mobility but is willing to try more today. He is tolerating her tube feeds and also taking oral intake.  Vital signs in last 24 hours: Temp:  [98.2 F (36.8 C)-98.5 F (36.9 C)] 98.5 F (36.9 C) (11/17 0449) Pulse Rate:  [99-143] 104 (11/17 0449) Resp:  [16-20] 20 (11/17 0449) BP: (97-106)/(52-57) 97/57 (11/17 0449) SpO2:  [95 %-100 %] 98 % (11/17 0449) Weight:  [109.5 kg (241 lb 4.8 oz)] 109.5 kg (241 lb 4.8 oz) (11/17 0421) Last BM Date: 10/07/16  Intake/Output from previous day: 11/16 0701 - 11/17 0700 In: 2062 [P.O.:25; I.V.:917; NG/GT:1020; IV Piggyback:100] Out: 590 [Urine:350; Drains:40; Stool:200]  GI: Abdomen is soft, nontender, nondistended. Midline staples well approximated without erythema or drainage. 2 mucous fistulas and end ileostomy with ostomy bags in place. Appropriate ileostomy function and minimal drainage from mucous fistulas. JP drains bilaterally each draining serosanguineous fluid.  Lab Results:  CBC  Recent Labs  10/06/16 0412 10/07/16 0416  WBC 19.2* 14.6*  HGB 8.9* 7.5*  HCT 26.4* 23.5*  PLT 511* 410   CMP     Component Value Date/Time   NA 134 (L) 10/07/2016 0416   K 3.8 10/08/2016 0704   CL 104 10/07/2016 0416   CO2 26 10/07/2016 0416   GLUCOSE 107 (H) 10/07/2016 0416   BUN 17 10/07/2016 0416   CREATININE 0.72 10/07/2016 0416   CALCIUM 6.8 (L) 10/07/2016 0416   PROT 5.5 (L) 10/06/2016 0412   ALBUMIN 1.1 (L) 10/06/2016 0412   AST 52 (H) 10/06/2016 0412   ALT 21 10/06/2016 0412   ALKPHOS 132 (H) 10/06/2016 0412   BILITOT 0.8 10/06/2016 0412   GFRNONAA >60 10/07/2016 0416   GFRAA >60 10/07/2016 0416   PT/INR  Recent Labs  10/06/16 0412  LABPROT 16.4*  INR 1.31    Studies/Results: Dg Abd 1 View  Result Date:  10/06/2016 CLINICAL DATA:  Nasogastric tube placement.  Initial encounter. EXAM: ABDOMEN - 1 VIEW COMPARISON:  CT of the abdomen and pelvis performed 10/04/2016 FINDINGS: The patient's enteric tube is noted ending overlying the body of the stomach. The visualized bowel gas pattern is nonspecific. Mildly distended small bowel loops are seen. The stomach is partially filled with air and fluid. Postoperative change is noted along the midline abdominal wall. No acute osseous abnormalities are seen. IMPRESSION: 1. Enteric tube noted ending overlying the body of the stomach. 2. Nonspecific bowel gas pattern. Mildly distended small bowel loops seen. Stomach partially filled with fluid and air. Electronically Signed   By: Roanna Raider M.D.   On: 10/06/2016 20:41    Assessment/Plan: 70 year old female postop day 59from damage control surgery and postop day 20frommaturation of it and ileostomy and mucous fistulas. IR placed drains placed 3 days ago for bilateral abdominal abscesses that are now draining serous and was fluid. Clinically improved today. Patient has tolerated tube feeds and has markedly improved energy levels today. Encourage physical therapy and oral intake.    Ricarda Frame, MD FACS General Surgeon  10/08/2016

## 2016-10-08 NOTE — Progress Notes (Signed)
PT Cancellation Note  Patient Details Name: Ruth Gray MRN: [id number] DOB: [birth year]   Cancelled Treatment:    Reason Eval/Treat Not Completed: Other (comment). Treatment attempted, however pt refuses at this time secondary to lethargy. Will re-attempt at another time.   Zahid Carneiro 10/08/2016, 4:19 PM  Elizabeth Palau, PT, DPT [phone]

## 2016-10-09 LAB — BODY FLUID CULTURE
CULTURE: NO GROWTH
Culture: NO GROWTH

## 2016-10-09 LAB — CBC
HCT: 23.3 % — ABNORMAL LOW (ref 35.0–47.0)
HEMOGLOBIN: 7.8 g/dL — AB (ref 12.0–16.0)
MCH: 31.9 pg (ref 26.0–34.0)
MCHC: 33.3 g/dL (ref 32.0–36.0)
MCV: 95.7 fL (ref 80.0–100.0)
Platelets: 341 10*3/uL (ref 150–440)
RBC: 2.44 MIL/uL — AB (ref 3.80–5.20)
RDW: 17.9 % — ABNORMAL HIGH (ref 11.5–14.5)
WBC: 11.7 10*3/uL — ABNORMAL HIGH (ref 3.6–11.0)

## 2016-10-09 LAB — BASIC METABOLIC PANEL
ANION GAP: 1 — AB (ref 5–15)
BUN: 13 mg/dL (ref 6–20)
CHLORIDE: 103 mmol/L (ref 101–111)
CO2: 29 mmol/L (ref 22–32)
Calcium: 6.8 mg/dL — ABNORMAL LOW (ref 8.9–10.3)
Creatinine, Ser: 0.67 mg/dL (ref 0.44–1.00)
GFR calc Af Amer: >60 mL/min (ref >60)
GFR calc non Af Amer: >60 mL/min (ref >60)
GLUCOSE: 104 mg/dL — AB (ref 65–99)
POTASSIUM: 3.9 mmol/L (ref 3.5–5.1)
Sodium: 133 mmol/L — ABNORMAL LOW (ref 135–145)

## 2016-10-09 LAB — GLUCOSE, CAPILLARY
GLUCOSE-CAPILLARY: 110 mg/dL — AB (ref 65–99)
GLUCOSE-CAPILLARY: 107 mg/dL — AB (ref 65–99)
GLUCOSE-CAPILLARY: 109 mg/dL — AB (ref 65–99)
GLUCOSE-CAPILLARY: 103 mg/dL — AB (ref 65–99)
Glucose-Capillary: 119 mg/dL — ABNORMAL HIGH (ref 65–99)
Glucose-Capillary: 118 mg/dL — ABNORMAL HIGH (ref 65–99)

## 2016-10-09 NOTE — Progress Notes (Signed)
Subjective:   She feels good this morning. She is conversant and appears to be well oriented. She's not nauseated and complains of no significant abdominal pain. Since the JP drains were placed she appears to have much less abdominal discomfort. Her clinical progress has been reviewed from the last time I saw her in early November until the current time.  Vital signs in last 24 hours: Temp:  [97.7 F (36.5 C)-98.5 F (36.9 C)] 98.3 F (36.8 C) (11/18 0527) Pulse Rate:  [95-108] 99 (11/18 0527) Resp:  [16-18] 18 (11/18 0527) BP: (98-121)/(54-71) 102/56 (11/18 0527) SpO2:  [95 %-97 %] 97 % (11/18 0527) Weight:  [108.3 kg (238 lb 12.8 oz)] 108.3 kg (238 lb 12.8 oz) (11/18 0527) Last BM Date: 10/09/16  Intake/Output from previous day: 11/17 0701 - 11/18 0700 In: 2779.3 [P.O.:120; I.V.:1215.8; NG/GT:1233.5; IV Piggyback:150] Out: 2660 [Urine:2125; Drains:30; Stool:505]  Exam:  Her abdomen is soft. There is no significant drainage. Her ileostomy is working well. She does appear to have some stool from her mucous fistula in the right upper quadrant. Her lateral drains are draining serosanguineous material.  Lab Results:  CBC  Recent Labs  10/07/16 0416 10/09/16 0429  WBC 14.6* 11.7*  HGB 7.5* 7.8*  HCT 23.5* 23.3*  PLT 410 341   CMP     Component Value Date/Time   NA 133 (L) 10/09/2016 0429   K 3.9 10/09/2016 0429   CL 103 10/09/2016 0429   CO2 29 10/09/2016 0429   GLUCOSE 104 (H) 10/09/2016 0429   BUN 13 10/09/2016 0429   CREATININE 0.67 10/09/2016 0429   CALCIUM 6.8 (L) 10/09/2016 0429   PROT 5.5 (L) 10/06/2016 0412   ALBUMIN 1.1 (L) 10/06/2016 0412   AST 52 (H) 10/06/2016 0412   ALT 21 10/06/2016 0412   ALKPHOS 132 (H) 10/06/2016 0412   BILITOT 0.8 10/06/2016 0412   GFRNONAA >60 10/09/2016 0429   GFRAA >60 10/09/2016 0429   PT/INR No results for input(s): LABPROT, INR in the last 72 hours.  Studies/Results: No results found.  Assessment/Plan: Overall she  appears to be improved. We are contemplating moving her to skilled nursing sometime early in the week. She continues on enteral feedings and is eating some on her own. She is making satisfactory progress.

## 2016-10-10 LAB — CREATININE, SERUM
Creatinine, Ser: 0.64 mg/dL (ref 0.44–1.00)
GFR calc Af Amer: >60 mL/min (ref >60)
GFR calc non Af Amer: >60 mL/min (ref >60)

## 2016-10-10 LAB — GLUCOSE, CAPILLARY
GLUCOSE-CAPILLARY: 105 mg/dL — AB (ref 65–99)
GLUCOSE-CAPILLARY: 113 mg/dL — AB (ref 65–99)
GLUCOSE-CAPILLARY: 115 mg/dL — AB (ref 65–99)
GLUCOSE-CAPILLARY: 115 mg/dL — AB (ref 65–99)
GLUCOSE-CAPILLARY: 120 mg/dL — AB (ref 65–99)
GLUCOSE-CAPILLARY: 121 mg/dL — AB (ref 65–99)
Glucose-Capillary: 111 mg/dL — ABNORMAL HIGH (ref 65–99)

## 2016-10-10 NOTE — Progress Notes (Signed)
Subjective:   She continues to feel well. She's not had any new symptoms. She does not have any abdominal pain. She's not nauseated has been taking some oral food. She continues on enteral feedings through the Dobbhoff tube.  Vital signs in last 24 hours: Temp:  [97.5 F (36.4 C)-98.2 F (36.8 C)] 97.5 F (36.4 C) (11/19 0524) Pulse Rate:  [100-110] 103 (11/19 0524) Resp:  [17-18] 18 (11/19 0524) BP: (103-112)/(61-70) 112/66 (11/19 0524) SpO2:  [93 %-99 %] 93 % (11/19 0524) Weight:  [100.2 kg (221 lb)] 100.2 kg (221 lb) (11/19 0537) Last BM Date:  (ostomy puting out liquid stool)  Intake/Output from previous day: 11/18 0701 - 11/19 0700 In: 2854 [P.O.:240; I.V.:1220; NG/GT:1204; IV Piggyback:100] Out: 3701 [Urine:3025; Drains:26; Stool:650]  Exam:  She has some minimal drainage in the midline. There is no sign of any significant infection. The ostomies look healthy.  Lab Results:  CBC  Recent Labs  10/09/16 0429  WBC 11.7*  HGB 7.8*  HCT 23.3*  PLT 341   CMP     Component Value Date/Time   NA 133 (L) 10/09/2016 0429   K 3.9 10/09/2016 0429   CL 103 10/09/2016 0429   CO2 29 10/09/2016 0429   GLUCOSE 104 (H) 10/09/2016 0429   BUN 13 10/09/2016 0429   CREATININE 0.64 10/10/2016 0440   CALCIUM 6.8 (L) 10/09/2016 0429   PROT 5.5 (L) 10/06/2016 0412   ALBUMIN 1.1 (L) 10/06/2016 0412   AST 52 (H) 10/06/2016 0412   ALT 21 10/06/2016 0412   ALKPHOS 132 (H) 10/06/2016 0412   BILITOT 0.8 10/06/2016 0412   GFRNONAA >60 10/10/2016 0440   GFRAA >60 10/10/2016 0440   PT/INR No results for input(s): LABPROT, INR in the last 72 hours.  Studies/Results: No results found.  Assessment/Plan: I talk with her about transitioning to oral intake. Our goal would be to get her to skilled nursing facility and try to get her off enteral feedings as soon as possible. Overall she's doing remarkably well for the severity of her illness.

## 2016-10-11 ENCOUNTER — Inpatient Hospital Stay: Payer: Medicare Other

## 2016-10-11 LAB — GLUCOSE, CAPILLARY
GLUCOSE-CAPILLARY: 114 mg/dL — AB (ref 65–99)
GLUCOSE-CAPILLARY: 123 mg/dL — AB (ref 65–99)
GLUCOSE-CAPILLARY: 98 mg/dL (ref 65–99)
Glucose-Capillary: 112 mg/dL — ABNORMAL HIGH (ref 65–99)
Glucose-Capillary: 92 mg/dL (ref 65–99)

## 2016-10-11 LAB — MAGNESIUM: MAGNESIUM: 1.7 mg/dL (ref 1.7–2.4)

## 2016-10-11 LAB — POTASSIUM: POTASSIUM: 3.9 mmol/L (ref 3.5–5.1)

## 2016-10-11 LAB — PHOSPHORUS: PHOSPHORUS: 2.6 mg/dL (ref 2.5–4.6)

## 2016-10-11 MED ORDER — BOOST / RESOURCE BREEZE PO LIQD: 1.0000 | Freq: Every day | ORAL | Status: DC

## 2016-10-11 MED ORDER — ENSURE ENLIVE PO LIQD: 237.0000 mL | Freq: Two times a day (BID) | ORAL | Status: DC

## 2016-10-11 NOTE — Progress Notes (Signed)
Received patient care handoff report from Bernette Redbird., RN [phone], 10/11/16  ileostomy pouch/base and dressing to lower abdomen changed.

## 2016-10-11 NOTE — Progress Notes (Signed)
Physical Therapy Treatment Patient Details Name: Ruth Gray MRN: [id number] DOB: [birth year] Today's Date: 10/11/2016    History of Present Illness Pt admitted on 10/29 secondary to acute diverticulitis. Pt with complaints of abdmonial pain with nausea/vomiting. PMH includes RA and smoking. Stay complicated by partial bowel obstruction followed by bowel perforation requiring Sx for colon resection and exploratory laparotomy on 10/31. She then transferred to CCU intubated from [facility], and required further SX for re-exploration on 11/4 with ileostomy placed. Pt then extubated on 09/29/16.  Pt is s/p procedure on 11/14 for drainage of abscess, JP drain placed. Tube feeds started on 11/15 secondary to poor nutrition. Pt also with noted rapid response on 11/15 secondary to low glucose.    PT Comments    Pt is making gradual progress towards goals. Still requires +2 assist for all mobility. Able to ambulate to recliner this date with slow technique. Pt fatigues quickly with all exertion. Seated balance much improved this date and pt able to performed improved there-ex endurance. Pt appears motivated to perform therapy.  Follow Up Recommendations  SNF     Equipment Recommendations       Recommendations for Other Services       Precautions / Restrictions Precautions Precautions: Fall Restrictions Weight Bearing Restrictions: No    Mobility  Bed Mobility Overal bed mobility: Needs Assistance Bed Mobility: Supine to Sit     Supine to sit: Min assist     General bed mobility comments: Pt able to initiate movement of B LE off bed, however still requires assistance for trunk support. Once seated at EOB, pt able to sit with cga. Safe technique performed  Transfers Overall transfer level: Needs assistance Equipment used: Rolling walker (2 wheeled) Transfers: Sit to/from Stand Sit to Stand: Mod assist;+2 physical assistance         General transfer comment: 2 attempts for standing,  requiring B foot placement by therapist and feet being blocked as they tend to slide out in front. Once standing, pt with slight post sway, able to self correct with cues. Able to perform pre gait movement, lifting up each leg and weight shifting  Ambulation/Gait Ambulation/Gait assistance: Mod assist;+2 physical assistance Ambulation Distance (Feet): 2 Feet Assistive device: Rolling walker (2 wheeled) Gait Pattern/deviations: Step-to pattern     General Gait Details: Slow step to gait pattern performed at EOB. Pt needs heavy verbal cues for sequencing and stepping towards chair. Safe technique performed   Stairs            Wheelchair Mobility    Modified Rankin (Stroke Patients Only)       Balance                                    Cognition Arousal/Alertness: Awake/alert Behavior During Therapy: WFL for tasks assessed/performed Overall Cognitive Status: Within Functional Limits for tasks assessed                      Exercises Other Exercises Other Exercises: supine ther-ex performed including B ankle pumps, SLRs, hip abd/add, and SAQ. All ther-ex performed x 12 reps with cga. Safe technique performed.    General Comments        Pertinent Vitals/Pain Pain Assessment: No/denies pain    Home Living                      Prior Function  PT Goals (current goals can now be found in the care plan section) Acute Rehab PT Goals Patient Stated Goal: to get stronger PT Goal Formulation: With patient Time For Goal Achievement: 10/15/16 Potential to Achieve Goals: Good Progress towards PT goals: Progressing toward goals    Frequency    Min 2X/week      PT Plan Current plan remains appropriate    Co-evaluation             End of Session Equipment Utilized During Treatment: Gait belt Activity Tolerance: Patient tolerated treatment well Patient left: in chair;with chair alarm set     Time: 8110-3159 PT  Time Calculation (min) (ACUTE ONLY): 23 min  Charges:  $Gait Training: 8-22 mins $Therapeutic Exercise: 8-22 mins                    G Codes:      Laureano Hetzer 19-Oct-2016, 10:52 AM  Elizabeth Palau, PT, DPT [phone]

## 2016-10-11 NOTE — Progress Notes (Signed)
Pharmacy Antibiotic Note  Ruth Gray is a 70 y.o. 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  Pharmacy has been consulted for electrolyte monitoring.  Plan: All electrolytes wnl.  Will recheck labs on 11/22.   Height: 5\' 7"  (170.2 cm) Weight: 220 lb 4.8 oz (99.9 kg) IBW/kg (Calculated) : 61.6  Temp (24hrs), Avg:98.1 F (36.7 C), Min:98 F (36.7 C), Max:98.2 F (36.8 C)   Recent Labs Lab 10/06/16 0412 10/07/16 0416 10/09/16 0429 10/10/16 0440  WBC 19.2* 14.6* 11.7*  --   CREATININE 0.78 0.72 0.67 0.64    Estimated Creatinine Clearance: 80.6 mL/min (by C-G formula based on SCr of 0.64 mg/dL).    Allergies  Allergen Reactions  . Ciprofloxacin Swelling  . Tramadol Nausea And Vomiting  . Penicillins Swelling and Rash    Has patient had a PCN reaction causing immediate rash, facial/tongue/throat swelling, SOB or lightheadedness with hypotension: {no Has patient had a PCN reaction causing severe rash involving mucus membranes or skin necrosis: no Has patient had a PCN reaction that required hospitalization no Has patient had a PCN reaction occurring within the last 10 years: no If all of the above answers are "NO", then may proceed with Cephalosporin use.      Thank you for allowing pharmacy to be a part of this patient's care.  10/12/16, PharmD, BCPS Clinical Pharmacist  10/11/2016 3:02 PM

## 2016-10-11 NOTE — Progress Notes (Signed)
Nutrition Follow-up  DOCUMENTATION CODES:   Severe malnutrition in context of acute illness/injury  INTERVENTION:  -Recommend continuing current TF at goal rate via dobhoff tube until pt demonstrating increased po intake by attempting to eat more than bites at meal time -Pt may benefit from appetite stimulant -Pt reports she prefers Ensure over the Boost, but reports she prefers the smaller one (not sure what pt is referring to). Will alternate between Ensure and Boost, continue Magic Cup -Pt continues on D10 infusion at 50 ml/hr which was started for hypoglycemia last week; recommend trial of discontinuation  NUTRITION DIAGNOSIS:   Inadequate oral intake related to acute illness as evidenced by NPO status.  Being addressed via TF  GOAL:   Patient will meet greater than or equal to 90% of their needs  MONITOR:   PO intake, Supplement acceptance, Labs, Weight trends, I & O's  REASON FOR ASSESSMENT:   Ventilator    ASSESSMENT:    Tolerating Vital 1.5 at rate of 50 ml/hr via 10 french dobhoff. No signs of GI intolerance, 385 mL stool documented via ileostomy in 24 hours  Pt reports appetite improving, however not reflected in po intake. Pt did not eat anything at breakfast this AM, bites only at lunch. Pt not drinking supplements   Labs and meds reviewed  Diet Order:  DIET DYS 3 Room service appropriate? Yes; Fluid consistency: Thin  Skin:   (no pressure ulcers documented)  Last BM:  11/20  Height:   Ht Readings from Last 1 Encounters:  09/21/16 5\' 7"  (1.702 m)    Weight:   Wt Readings from Last 1 Encounters:  10/11/16 220 lb 4.8 oz (99.9 kg)    Filed Weights   10/09/16 0527 10/10/16 0537 10/11/16 0341  Weight: 238 lb 12.8 oz (108.3 kg) 221 lb (100.2 kg) 220 lb 4.8 oz (99.9 kg)    BMI:  Body mass index is 34.5 kg/m.  Estimated Nutritional Needs:   Kcal:  2000-2200 kcals  Protein:  >/= 105 g  Fluid:  >/= 2 L  EDUCATION NEEDS:   Education needs  addressed  10/13/16 MS, RD, LDN [phone] Pager  [phone] Weekend/On-Call Pager

## 2016-10-11 NOTE — Progress Notes (Signed)
16 Days Post-Op  Subjective: Patient tolerating tube feeds via a Dobbhoff tube she is asking about placement at a nursing facility. He has no other complaints  Objective: Vital signs in last 24 hours: Temp:  [98 F (36.7 C)-98.2 F (36.8 C)] 98 F (36.7 C) (11/20 0542) Pulse Rate:  [100-104] 104 (11/20 0542) Resp:  [20-22] 20 (11/20 0542) BP: (118)/(61-67) 118/67 (11/20 0542) SpO2:  [98 %] 98 % (11/20 0542) Weight:  [220 lb 4.8 oz (99.9 kg)] 220 lb 4.8 oz (99.9 kg) (11/20 0341) Last BM Date: 10/10/16  Intake/Output from previous day: 11/19 0701 - 11/20 0700 In: 2955.7 [P.O.:120; I.V.:958.8; NG/GT:1208.8; IV Piggyback:50] Out: 3810 [Urine:3425; Stool:385] Intake/Output this shift: Total I/O In: 240 [P.O.:240] Out: -   Physical exam:  Abdomen is soft multiple ostomies present wound is clean nontender abdomen nontender calves is awake alert and oriented  Lab Results: CBC   Recent Labs  10/09/16 0429  WBC 11.7*  HGB 7.8*  HCT 23.3*  PLT 341   BMET  Recent Labs  10/09/16 0429 10/10/16 0440 10/11/16 0535  NA 133*  --   --   K 3.9  --  3.9  CL 103  --   --   CO2 29  --   --   GLUCOSE 104*  --   --   BUN 13  --   --   CREATININE 0.67 0.64  --   CALCIUM 6.8*  --   --    PT/INR No results for input(s): LABPROT, INR in the last 72 hours. ABG No results for input(s): PHART, HCO3 in the last 72 hours.  Invalid input(s): PCO2, PO2  Studies/Results: No results found.  Anti-infectives: Anti-infectives    Start     Dose/Rate Route Frequency Ordered Stop   10/02/16 1400  meropenem (MERREM) IVPB SOLR 1 g     1 g 100 mL/hr over 30 Minutes Intravenous Every 8 hours 10/02/16 0951     10/02/16 0830  meropenem (MERREM) 1 g in sodium chloride 0.9 % 100 mL IVPB  Status:  Discontinued     1 g 200 mL/hr over 30 Minutes Intravenous Every 8 hours 10/02/16 0742 10/02/16 0951   09/23/16 2200  meropenem (MERREM) IVPB SOLR 1 g     1 g over 30 Minutes Intravenous Every 8  hours 09/23/16 1654 10/01/16 2320   09/22/16 1000  meropenem (MERREM) IVPB SOLR 1 g  Status:  Discontinued     1 g over 30 Minutes Intravenous Every 12 hours 09/22/16 0901 09/23/16 1655   09/21/16 1830  meropenem (MERREM) 1 g in sodium chloride 0.9 % 100 mL IVPB  Status:  Discontinued     1 g 200 mL/hr over 30 Minutes Intravenous Every 12 hours 09/21/16 1238 09/22/16 0858   09/21/16 1500  metroNIDAZOLE (FLAGYL) tablet 500 mg  Status:  Discontinued     500 mg Oral Every 8 hours 09/21/16 0909 09/21/16 0910   09/21/16 1500  metroNIDAZOLE (FLAGYL) IVPB 500 mg  Status:  Discontinued     500 mg 100 mL/hr over 60 Minutes Intravenous Every 8 hours 09/21/16 0910 09/21/16 1238   09/19/16 1800  aztreonam (AZACTAM) 2 g in dextrose 5 % 50 mL IVPB  Status:  Discontinued     2 g 100 mL/hr over 30 Minutes Intravenous Every 6 hours 09/19/16 1457 09/21/16 1238   09/19/16 1500  cefTRIAXone (ROCEPHIN) IVPB 2 g  Status:  Discontinued     2 g 100 mL/hr over  30 Minutes Intravenous  Once 09/19/16 1456 09/19/16 1457   09/19/16 1500  metroNIDAZOLE (FLAGYL) IVPB 500 mg  Status:  Discontinued     500 mg 100 mL/hr over 60 Minutes Intravenous Every 8 hours 09/19/16 1457 09/21/16 0909   09/19/16 1445  cefTRIAXone (ROCEPHIN) 2 g in dextrose 5 % 50 mL IVPB  Status:  Discontinued     2 g 100 mL/hr over 30 Minutes Intravenous  Once 09/19/16 1440 09/19/16 1455   09/19/16 1430  cefTRIAXone (ROCEPHIN) 2 g in dextrose 5 % 50 mL IVPB  Status:  Discontinued     2 g 100 mL/hr over 30 Minutes Intravenous  Once 09/19/16 1427 09/19/16 1435   09/19/16 1430  metroNIDAZOLE (FLAGYL) IVPB 500 mg  Status:  Discontinued     500 mg 100 mL/hr over 60 Minutes Intravenous  Once 09/19/16 1427 09/19/16 1435      Assessment/Plan: s/p Procedure(s): EXPLORATORY LAPAROTOMY and right colon resection COLOSTOMY   Patient 16 days status post exploratory laparoscopy and ostomy placement for perforation. She's feeling better every day and  tolerating her tube feeds I would like to start some full liquids today and see if she tolerates that if so that we could possibly remove the Dobbhoff tube and get her to a skilled nursing facility once she has improved some more. disContinue the Foley catheter today  [name], MD, FACS  10/11/2016

## 2016-10-12 LAB — GLUCOSE, CAPILLARY
GLUCOSE-CAPILLARY: 106 mg/dL — AB (ref 65–99)
GLUCOSE-CAPILLARY: 115 mg/dL — AB (ref 65–99)
GLUCOSE-CAPILLARY: 127 mg/dL — AB (ref 65–99)
GLUCOSE-CAPILLARY: 89 mg/dL (ref 65–99)
Glucose-Capillary: 135 mg/dL — ABNORMAL HIGH (ref 65–99)
Glucose-Capillary: 102 mg/dL — ABNORMAL HIGH (ref 65–99)

## 2016-10-12 LAB — CBC WITH DIFFERENTIAL/PLATELET
BASOS ABS: 0 10*3/uL (ref 0–0.1)
Basophils Relative: 0 %
EOS ABS: 1.2 10*3/uL — AB (ref 0–0.7)
EOS PCT: 11 %
HCT: 23.1 % — ABNORMAL LOW (ref 35.0–47.0)
HEMOGLOBIN: 7.8 g/dL — AB (ref 12.0–16.0)
LYMPHS ABS: 2.3 10*3/uL (ref 1.0–3.6)
Lymphocytes Relative: 20 %
MCH: 32 pg (ref 26.0–34.0)
MCHC: 33.6 g/dL (ref 32.0–36.0)
MCV: 95.2 fL (ref 80.0–100.0)
Monocytes Absolute: 1 10*3/uL — ABNORMAL HIGH (ref 0.2–0.9)
Monocytes Relative: 9 %
NEUTROS PCT: 60 %
Neutro Abs: 6.8 10*3/uL — ABNORMAL HIGH (ref 1.4–6.5)
PLATELETS: 314 10*3/uL (ref 150–440)
RBC: 2.42 MIL/uL — AB (ref 3.80–5.20)
RDW: 18.3 % — ABNORMAL HIGH (ref 11.5–14.5)
WBC: 11.3 10*3/uL — AB (ref 3.6–11.0)

## 2016-10-12 NOTE — Progress Notes (Signed)
Physical Therapy Treatment Patient Details Name: Ruth Gray MRN: [id number] DOB: 1946/11/09 Today's Date: 10/12/2016    History of Present Illness Pt admitted on 10/29 secondary to acute diverticulitis. Pt with complaints of abdmonial pain with nausea/vomiting. PMH includes RA and smoking. Stay complicated by partial bowel obstruction followed by bowel perforation requiring Sx for colon resection and exploratory laparotomy on 10/31. She then transferred to CCU intubated from [facility], and required further SX for re-exploration on 11/4 with ileostomy placed. Pt then extubated on 09/29/16.  Pt is s/p procedure on 11/14 for drainage of abscess, JP drain placed. Tube feeds started on 11/15 secondary to poor nutrition. Pt also with noted rapid response on 11/15 secondary to low glucose.    PT Comments    Pt is making gradual progress towards goals with ability to ambulate to recliner. Pt demonstrates good endurance with all there-ex and appears motivated to perform physical therapy. Improved technique with all OOB mobility, however still requires +2 assist at this time. Good endurance with all there-ex.  Follow Up Recommendations  SNF     Equipment Recommendations       Recommendations for Other Services       Precautions / Restrictions Precautions Precautions: Fall Restrictions Weight Bearing Restrictions: No    Mobility  Bed Mobility Overal bed mobility: Needs Assistance Bed Mobility: Supine to Sit     Supine to sit: Min assist     General bed mobility comments: Improved technique this date for bed mobility able to initiate trunk support to sit at EOB. Once seated, able to sit with cga. Still requires +2 assist for transition in position.  Transfers Overall transfer level: Needs assistance Equipment used: Rolling walker (2 wheeled) Transfers: Sit to/from Stand Sit to Stand: Mod assist;+2 physical assistance         General transfer comment: 2 attempts for standing, 1st  attempt, unable to stand or lift buttocks. 2nd attempt with improved technique and pt able to stand with slight post leaning noted. Able to self correct with verbal cues.  Ambulation/Gait Ambulation/Gait assistance: Mod assist;+2 physical assistance Ambulation Distance (Feet): 3 Feet Assistive device: Rolling walker (2 wheeled) Gait Pattern/deviations: Step-to pattern     General Gait Details: slow step to gait pattern with safe technique. Pt fatigues very quickly with all endurance.    Stairs            Wheelchair Mobility    Modified Rankin (Stroke Patients Only)       Balance                                    Cognition Arousal/Alertness: Awake/alert Behavior During Therapy: WFL for tasks assessed/performed Overall Cognitive Status: Within Functional Limits for tasks assessed                      Exercises Other Exercises Other Exercises: supine ther-ex performed including B quad sets, SLRs, hip abd/add, and SAQ. All ther-ex performed x 15 reps with cga. Safe technique performed.    General Comments        Pertinent Vitals/Pain Pain Assessment: No/denies pain    Home Living                      Prior Function            PT Goals (current goals can now be found in the care plan  section) Acute Rehab PT Goals Patient Stated Goal: to get stronger PT Goal Formulation: With patient Time For Goal Achievement: 10/15/16 Potential to Achieve Goals: Good Progress towards PT goals: Progressing toward goals    Frequency    Min 2X/week      PT Plan Current plan remains appropriate    Co-evaluation             End of Session Equipment Utilized During Treatment: Gait belt Activity Tolerance: Patient tolerated treatment well Patient left: in chair;with chair alarm set     Time: 2505-3976 PT Time Calculation (min) (ACUTE ONLY): 23 min  Charges:  $Gait Training: 8-22 mins $Therapeutic Exercise: 8-22 mins                     G Codes:      Kobee Medlen 18-Oct-2016, 1:11 PM  Elizabeth Palau, PT, DPT [phone]

## 2016-10-12 NOTE — Clinical Social Work Note (Signed)
Late entry for 11/20: CSW met with patient the afternoon of 11/20 and provided supportive listening. Patient expressed that she had emotionally tired of being connected to tubes and remaining in the hospital. She stated that she would like to begin the next journey by getting to rehab as soon as she can. She wanted to progress as quickly as she could to meet that goal.   Today 11/21: CSW met with patient and her dobhoff had been discontinued and she has begun to eat more. Patient stated that she was very happy about this. CSW informed by MD that we were looking at discharge for tomorrow and CSW relayed this to patient. Patient was very happy to hear this. CSW has resubmitted information to [facility] in order to obtain reauth.  Shela Leff MSW,LCSW [phone]

## 2016-10-12 NOTE — Progress Notes (Signed)
Nutrition Follow-up  DOCUMENTATION CODES:   Severe malnutrition in context of acute illness/injury  INTERVENTION:  -Pt likes [employer], continue at [employer]; will also order prn and pt may request additional -continue Ensure and Boost Breeze -Modify pt snacks and pt does not like the chicken salad -Cater to pt preferences, family bringing in food -Calorie count in progress -TF order discontinued as NG tube removed, no access  NUTRITION DIAGNOSIS:   Inadequate oral intake related to acute illness as evidenced by NPO status.  Being addressed  GOAL:   Patient will meet greater than or equal to 90% of their needs  MONITOR:   PO intake, Supplement acceptance, Labs, Weight trends, I & O's  REASON FOR ASSESSMENT:   Ventilator    ASSESSMENT:    Pt out of bed to chair today with PT. Per son, plan to discharge to [facility] tomorrow if pt able to eat 1000 calories (50% of food per Zaneta RN)  Dobhoff removed today. Pt tolerating some po. Eating bites of [employer] milkshake on visit today. Pt ate some breakfast and magic cup this AM. Ate 30% of dinner last night. Pt took bites of chicken sandwich at lunch today  Labs:  Glucose Profile:   Recent Labs  10/12/16 0513 10/12/16 0739 10/12/16 1127  GLUCAP 115* 127* 135*  Meds: D10 at 50 ml/hr  Diet Order:  DIET DYS 3 Room service appropriate? Yes; Fluid consistency: Thin  Skin:  Reviewed, no issues  Last BM:  11/21  Height:   Ht Readings from Last 1 Encounters:  09/21/16 5\' 7"  (1.702 m)    Weight:   Wt Readings from Last 1 Encounters:  10/12/16 219 lb 12.8 oz (99.7 kg)    BMI:  Body mass index is 34.43 kg/m.  Estimated Nutritional Needs:   Kcal:  2000-2200 kcals  Protein:  >/= 105 g  Fluid:  >/= 2 L  EDUCATION NEEDS:   Education needs addressed  10/14/16 MS, RD, LDN [phone] Pager  [phone] Weekend/On-Call Pager

## 2016-10-12 NOTE — Progress Notes (Signed)
Patient up with physical therapy to chair, leakage around stoma occurred. pouch change to Right upper and lower stoma sites. Patient tolerating intake of food well, and wanted MD to be made aware.  Primary RN notified MD of patient calorie intake. Patient put back in bed with 4+ assist. Pt resting in bed continue to assist.

## 2016-10-13 LAB — GLUCOSE, CAPILLARY
GLUCOSE-CAPILLARY: 95 mg/dL (ref 65–99)
GLUCOSE-CAPILLARY: 98 mg/dL (ref 65–99)
GLUCOSE-CAPILLARY: 78 mg/dL (ref 65–99)
Glucose-Capillary: 92 mg/dL (ref 65–99)
Glucose-Capillary: 93 mg/dL (ref 65–99)

## 2016-10-13 LAB — PHOSPHORUS: Phosphorus: 4 mg/dL (ref 2.5–4.6)

## 2016-10-13 LAB — MAGNESIUM: MAGNESIUM: 1.7 mg/dL (ref 1.7–2.4)

## 2016-10-13 LAB — POTASSIUM: Potassium: 4 mmol/L (ref 3.5–5.1)

## 2016-10-13 MED ORDER — GUAIFENESIN-DM 100-10 MG/5ML PO SYRP
5.0000 mL | ORAL_SOLUTION | ORAL | Status: DC | PRN
  Administered 2016-10-13: 5 mL via ORAL
  Filled 2016-10-13: qty 5

## 2016-10-13 MED ORDER — NYSTATIN 100000 UNIT/GM EX POWD: Freq: Two times a day (BID) | CUTANEOUS | 0 refills | Status: DC

## 2016-10-13 MED ORDER — HYDROCODONE-ACETAMINOPHEN 5-300 MG PO TABS: 1.0000 | ORAL_TABLET | ORAL | 0 refills | Status: DC | PRN

## 2016-10-13 MED ORDER — BOOST / RESOURCE BREEZE PO LIQD: 1.0000 | Freq: Every day | ORAL | 1 refills | Status: DC

## 2016-10-13 MED ORDER — MEROPENEM-SODIUM CHLORIDE 1 GM/50ML IV SOLR: 1.0000 g | INTRAVENOUS | 1 refills | Status: DC

## 2016-10-13 MED ORDER — ONDANSETRON HCL 4 MG PO TABS: 4.0000 mg | ORAL_TABLET | Freq: Four times a day (QID) | ORAL | 0 refills | Status: DC | PRN

## 2016-10-13 NOTE — Discharge Instructions (Signed)
Routine ostomy care Dysphagia diet Follow-up with [facility] in 2 weeks May shower

## 2016-10-13 NOTE — Clinical Social Work Placement (Signed)
   CLINICAL SOCIAL WORK PLACEMENT  NOTE  Date:  10/13/2016  Patient Details  Name: Ruth Gray MRN: [id number] Date of Birth: 05-Sep-1946  Clinical Social Work is seeking post-discharge placement for this patient at the Skilled  Nursing Facility level of care (*CSW will initial, date and re-position this form in  chart as items are completed):  Yes   Patient/family provided with [facility] Clinical Social Work Department's list of facilities offering this level of care within the geographic area requested by the patient (or if unable, by the patient's family).  Yes   Patient/family informed of their freedom to choose among providers that offer the needed level of care, that participate in Medicare, Medicaid or managed care program needed by the patient, have an available bed and are willing to accept the patient.  Yes   Patient/family informed of [facility]'s ownership interest in [facility] and [facility], as well as of the fact that they are under no obligation to receive care at these facilities.  PASRR submitted to EDS on       PASRR number received on       Existing PASRR number confirmed on 10/01/16     FL2 transmitted to all facilities in geographic area requested by pt/family on 10/01/16     FL2 transmitted to all facilities within larger geographic area on       Patient informed that his/her managed care company has contracts with or will negotiate with certain facilities, including the following:        Yes   Patient/family informed of bed offers received.  Patient chooses bed at  [facility])     Physician recommends and patient chooses bed at  [facility])    Patient to be transferred to  ([facility]) on 10/13/16.  Patient to be transferred to facility by  ([facility])     Patient family notified on 10/13/16 of transfer.  Name of family member notified:  patient and son     PHYSICIAN       Additional Comment:     _______________________________________________ York Spaniel, LCSW 10/13/2016, 11:35 AM

## 2016-10-13 NOTE — Consult Note (Addendum)
WOC consult requested for post-op ostomy teaching. This was previously performed with the son at the bedside on 11/10; refer to [facility]' progress note.  Pt plans to discharge to a SNF today, according to the social worker via phone call this am.  She will have assistance with pouch emptying and application, and that facility can continue to perform teaching sessions with the patient and family member before she is discharged home from that location. Please re-consult if further assistance is needed.  Thank-you,  Cammie Mcgee MSN, RN, CWOCN, [place], CNS [phone]

## 2016-10-13 NOTE — Clinical Social Work Note (Signed)
Patient is to discharge today to [facility] as anticipated. Reauth obtained by CSW: [id number]. Discharge information sent to [facility]. Nurse is aware that patient will need to have a couple of days worth of ostomy supplies sent with her and that she will need a follow up appointment with Dr. Gavin Potters for her rimicade infusions. [facility] is aware of this and will arrange for facility to get her to her follow up appointments. CSW contacted patient's son to make him aware and patient to transport via EMS. York Spaniel MSW,LCSW [phone]

## 2016-10-13 NOTE — Progress Notes (Signed)
Pharmacy Antibiotic Note  Ruth Gray is a 70 y.o. 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  Pharmacy has been consulted for electrolyte monitoring.  Plan: All electrolytes wnl.  Will recheck labs on 11/24.   Height: 5\' 7"  (170.2 cm) Weight: 206 lb 1.6 oz (93.5 kg) IBW/kg (Calculated) : 61.6  Temp (24hrs), Avg:98.2 F (36.8 C), Min:97.9 F (36.6 C), Max:98.5 F (36.9 C)   Recent Labs Lab 10/07/16 0416 10/09/16 0429 10/10/16 0440 10/12/16 0540  WBC 14.6* 11.7*  --  11.3*  CREATININE 0.72 0.67 0.64  --     Estimated Creatinine Clearance: 78 mL/min (by C-G formula based on SCr of 0.64 mg/dL).    Allergies  Allergen Reactions  . Ciprofloxacin Swelling  . Tramadol Nausea And Vomiting  . Penicillins Swelling and Rash    Has patient had a PCN reaction causing immediate rash, facial/tongue/throat swelling, SOB or lightheadedness with hypotension: {no Has patient had a PCN reaction causing severe rash involving mucus membranes or skin necrosis: no Has patient had a PCN reaction that required hospitalization no Has patient had a PCN reaction occurring within the last 10 years: no If all of the above answers are "NO", then may proceed with Cephalosporin use.      Thank you for allowing pharmacy to be a part of this patient's care.  10/14/16, PharmD Clinical Pharmacist  10/13/2016 7:43 AM

## 2016-10-13 NOTE — Progress Notes (Signed)
18 Days Post-Op  Subjective: Patient feels well today and is ready for transfer tolerating a diet  Objective: Vital signs in last 24 hours: Temp:  [97.9 F (36.6 C)-98.5 F (36.9 C)] 97.9 F (36.6 C) (11/22 0407) Pulse Rate:  [96-100] 100 (11/22 0407) Resp:  [18-20] 18 (11/22 0407) BP: (111-115)/(58-59) 115/59 (11/22 0407) SpO2:  [97 %] 97 % (11/22 0407) Weight:  [206 lb 1.6 oz (93.5 kg)] 206 lb 1.6 oz (93.5 kg) (11/22 0415) Last BM Date: 10/11/16  Intake/Output from previous day: 11/21 0701 - 11/22 0700 In: 1398 [P.O.:180; I.V.:1118; IV Piggyback:100] Out: 3825 [Urine:3550; Stool:275] Intake/Output this shift: Total I/O In: 120 [P.O.:120] Out: -   Physical exam:  Multiple ostomies wound is clean soft nontender abdomen place PICC line in place  Lab Results: CBC   Recent Labs  10/12/16 0540  WBC 11.3*  HGB 7.8*  HCT 23.1*  PLT 314   BMET  Recent Labs  10/11/16 0535 10/13/16 0412  K 3.9 4.0   PT/INR No results for input(s): LABPROT, INR in the last 72 hours. ABG No results for input(s): PHART, HCO3 in the last 72 hours.  Invalid input(s): PCO2, PO2  Studies/Results: Dg Chest Port 1 View  Result Date: 10/11/2016 CLINICAL DATA:  PICC placement.  Initial encounter. EXAM: PORTABLE CHEST 1 VIEW COMPARISON:  Chest radiograph performed 09/26/2016 FINDINGS: The patient's left PICC is noted ending about the distal SVC. A right IJ line is also noted ending about the distal SVC. An enteric tube is noted ending coiled within the stomach. Minimal right basilar atelectasis is noted. A small right pleural effusion is suspected. No pneumothorax is seen. The cardiomediastinal silhouette is normal in size. No acute osseous abnormalities are identified. IMPRESSION: 1. Left PICC noted ending about the distal SVC. 2. Minimal right basilar atelectasis noted. Suspect small right pleural effusion. Electronically Signed   By: Roanna Raider M.D.   On: 10/11/2016 19:02     Anti-infectives: Anti-infectives    Start     Dose/Rate Route Frequency Ordered Stop   10/13/16 0000  meropenem (MERREM) 1 GM/50ML SOLR     1 g 100 mL/hr over 30 Minutes Intravenous Every 24 hours 10/13/16 1046     10/02/16 1400  meropenem (MERREM) IVPB SOLR 1 g     1 g 100 mL/hr over 30 Minutes Intravenous Every 8 hours 10/02/16 0951     10/02/16 0830  meropenem (MERREM) 1 g in sodium chloride 0.9 % 100 mL IVPB  Status:  Discontinued     1 g 200 mL/hr over 30 Minutes Intravenous Every 8 hours 10/02/16 0742 10/02/16 0951   09/23/16 2200  meropenem (MERREM) IVPB SOLR 1 g     1 g over 30 Minutes Intravenous Every 8 hours 09/23/16 1654 10/01/16 2320   09/22/16 1000  meropenem (MERREM) IVPB SOLR 1 g  Status:  Discontinued     1 g over 30 Minutes Intravenous Every 12 hours 09/22/16 0901 09/23/16 1655   09/21/16 1830  meropenem (MERREM) 1 g in sodium chloride 0.9 % 100 mL IVPB  Status:  Discontinued     1 g 200 mL/hr over 30 Minutes Intravenous Every 12 hours 09/21/16 1238 09/22/16 0858   09/21/16 1500  metroNIDAZOLE (FLAGYL) tablet 500 mg  Status:  Discontinued     500 mg Oral Every 8 hours 09/21/16 0909 09/21/16 0910   09/21/16 1500  metroNIDAZOLE (FLAGYL) IVPB 500 mg  Status:  Discontinued     500 mg 100 mL/hr  over 60 Minutes Intravenous Every 8 hours 09/21/16 0910 09/21/16 1238   09/19/16 1800  aztreonam (AZACTAM) 2 g in dextrose 5 % 50 mL IVPB  Status:  Discontinued     2 g 100 mL/hr over 30 Minutes Intravenous Every 6 hours 09/19/16 1457 09/21/16 1238   09/19/16 1500  cefTRIAXone (ROCEPHIN) IVPB 2 g  Status:  Discontinued     2 g 100 mL/hr over 30 Minutes Intravenous  Once 09/19/16 1456 09/19/16 1457   09/19/16 1500  metroNIDAZOLE (FLAGYL) IVPB 500 mg  Status:  Discontinued     500 mg 100 mL/hr over 60 Minutes Intravenous Every 8 hours 09/19/16 1457 09/21/16 0909   09/19/16 1445  cefTRIAXone (ROCEPHIN) 2 g in dextrose 5 % 50 mL IVPB  Status:  Discontinued     2 g 100 mL/hr  over 30 Minutes Intravenous  Once 09/19/16 1440 09/19/16 1455   09/19/16 1430  cefTRIAXone (ROCEPHIN) 2 g in dextrose 5 % 50 mL IVPB  Status:  Discontinued     2 g 100 mL/hr over 30 Minutes Intravenous  Once 09/19/16 1427 09/19/16 1435   09/19/16 1430  metroNIDAZOLE (FLAGYL) IVPB 500 mg  Status:  Discontinued     500 mg 100 mL/hr over 60 Minutes Intravenous  Once 09/19/16 1427 09/19/16 1435      Assessment/Plan: s/p Procedure(s): EXPLORATORY LAPAROTOMY and right colon resection COLOSTOMY   Patient doing very well planning transfer to SNF  Today  [name], MD, FACS  10/13/2016

## 2016-10-13 NOTE — Discharge Summary (Signed)
Physician Discharge Summary  Patient ID: Ruth Gray MRN: [id number] DOB/AGE: [birth year] 70 y.o.  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 Her pain was worsening and she was taken the operating room where resection was performed with multiple ostomies. Operatively she was in the ICU on a ventilator 4. Of time and as her condition improved she was moved to the MedSurg floor where ostomy nurse consultation was performed and a Dobbhoff tube was placed for enteral feedings her strength has been improving and she is now tolerating a dysphagia diet and the Dobbhoff tube is been removed. Her staples are being removed today prior to transfer to a cyst filled nursing facility. Overall patient is doing quite well she has instructions to shower and for physical therapy she can be up and about with assistance. She will follow-up with [facility] next week.  Consults: Internal medicine , intensivist  Disposition: 01-Home or Self Care     Medication List    STOP taking these medications   metroNIDAZOLE 500 MG tablet Commonly known as:  FLAGYL   sulfamethoxazole-trimethoprim 800-160 MG tablet Commonly known as:  BACTRIM DS,SEPTRA  DS     TAKE these medications   CALCIUM + D3 PO Take by mouth.   feeding supplement Liqd Take 1 Container by mouth daily at 3 pm.   Hydrocodone-Acetaminophen 5-300 MG Tabs Commonly known as:  VICODIN Take 1 tablet by mouth every 4 (four) hours as needed.   meropenem 1 GM/50ML Solr Commonly known as:  MERREM Inject 50 mLs (1 g total) into the vein daily.   METHOTREXATE (PF) [place] Inject into the skin.   nystatin powder Commonly known as:  MYCOSTATIN/NYSTOP Apply topically 2 (two) times daily.   ondansetron 4 MG tablet Commonly known as:  ZOFRAN Take 1 tablet (4 mg total) by mouth every 6 (six) hours as needed for nausea.   REMICADE IV Inject into the vein once a week.       Contact information for follow-up providers    Dionne Milo, MD Follow up in 2 week(s).   Specialty:  Surgery Contact information: [address] [place] [address] [phone]            Contact information for after-discharge care    Destination    [facility] Follow up.   Specialty:  Skilled Nursing Facility Contact information: [address] [place] [address] [phone]                  [name], MD, FACS

## 2016-10-13 NOTE — Care Management Important Message (Signed)
Important Message  Patient Details  Name: Ruth Gray MRN: [id number] Date of Birth: October 01, 1946   Medicare Important Message Given:  Yes    Chapman Fitch, RN 10/13/2016, 12:07 PM

## 2016-10-22 DIAGNOSIS — I82409 Acute embolism and thrombosis of unspecified deep veins of unspecified lower extremity: Secondary | ICD-10-CM

## 2016-10-22 HISTORY — DX: Acute embolism and thrombosis of unspecified deep veins of unspecified lower extremity: I82.409

## 2016-10-26 ENCOUNTER — Other Ambulatory Visit: Payer: Self-pay

## 2016-10-26 DIAGNOSIS — E538 Deficiency of other specified B group vitamins: Secondary | ICD-10-CM | POA: Insufficient documentation

## 2016-10-26 DIAGNOSIS — M810 Age-related osteoporosis without current pathological fracture: Secondary | ICD-10-CM | POA: Insufficient documentation

## 2016-10-26 IMAGING — MR MR KNEE*R* W/O CM
6 series · 36 of 40 positions shown · non-contrast
Comparison: Plain films right knee 05/23/2016.

CLINICAL DATA: The patient heard and felt a pop in her right knee
on 05/23/2016 after a twisting injury when stepping out of the
shower. Continued pain.

EXAM:
MRI OF THE RIGHT KNEE WITHOUT CONTRAST
TECHNIQUE: Multiplanar, multisequence MR imaging of the knee was performed. No
intravenous contrast was administered.

[Series 3: PD fat-sat · axial · 3.0mm · 0.50mm/px · z∈[-61,+52]mm · 9 of 35 slices shown (1 of 4)]
[im 1/35]
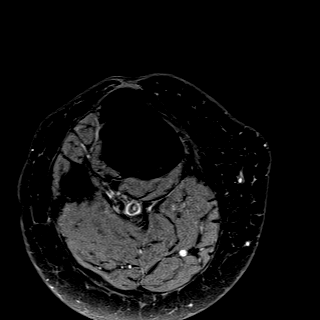
[im 5/35]
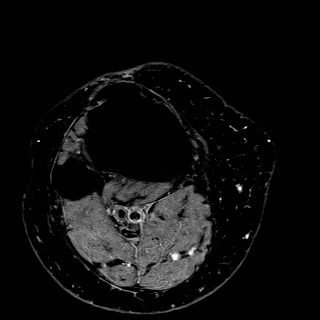
[im 9/35]
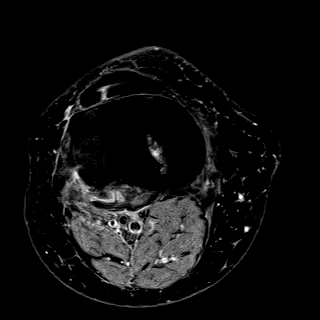
[im 13/35]
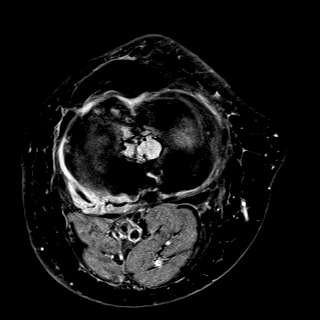
[im 18/35]
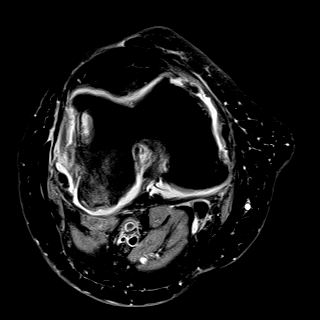
[im 22/35]
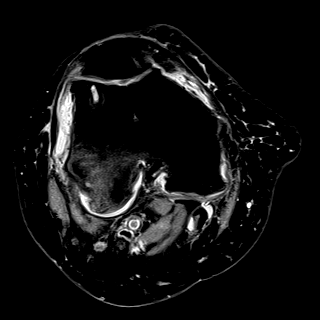
[im 26/35]
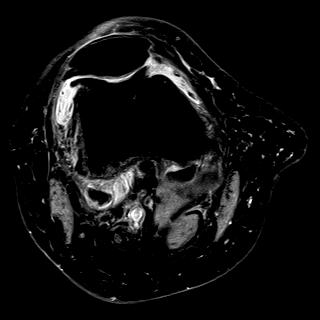
[im 30/35]
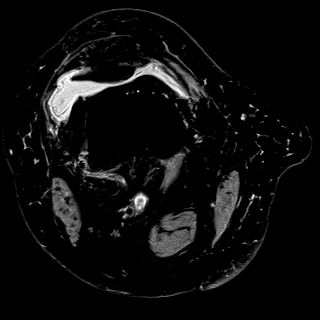
[im 35/35]
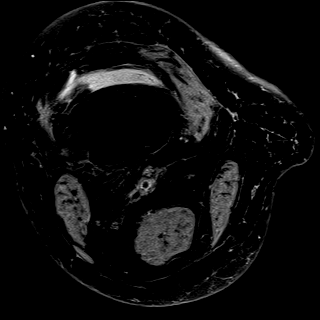

[Series 4: T1 · coronal · 3.0mm · 0.50mm/px · 3 of 28 slices shown]
[im 1/28]
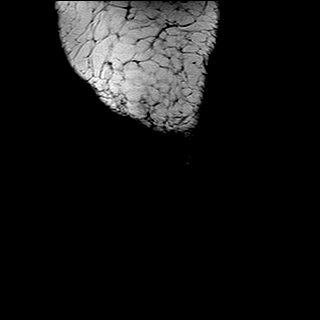
[im 5/28]
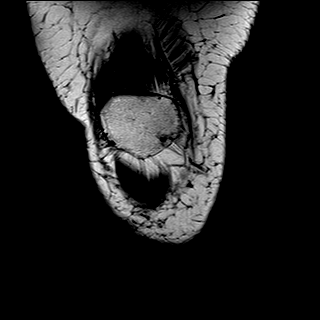
[im 10/28]
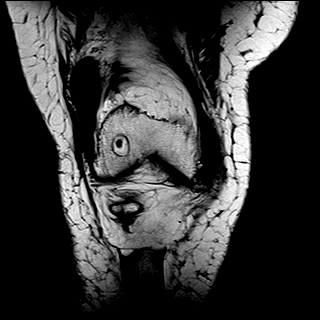

[Series 5: PD fat-sat · sagittal · 3.0mm · 0.50mm/px · 8 of 30 slices shown (2 of 4)]
[im 1/30]
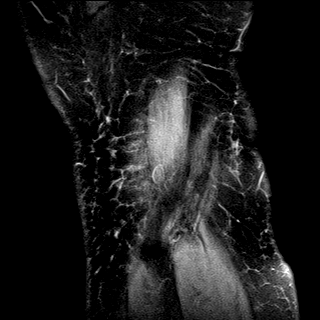
[im 5/30]
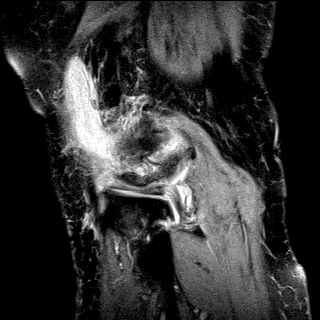
[im 9/30]
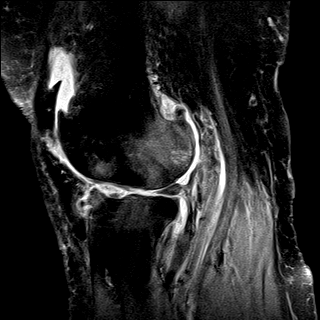
[im 13/30]
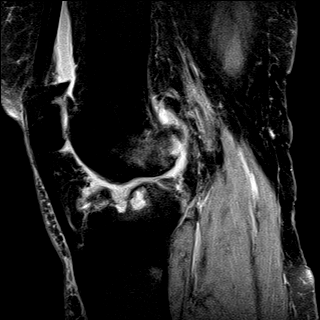
[im 17/30]
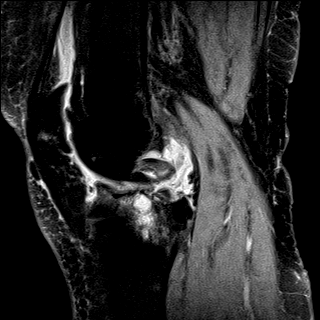
[im 21/30]
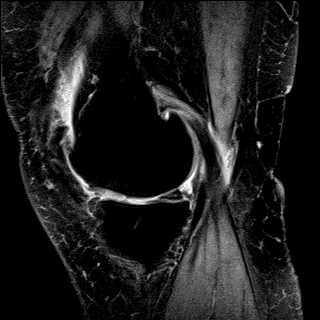
[im 25/30]
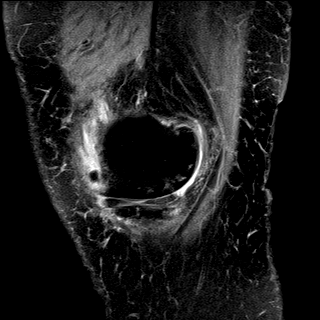
[im 30/30]
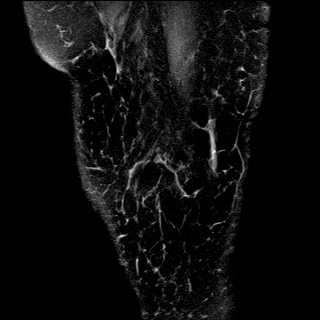

[Series 6: T2 fat-sat · coronal · 3.0mm · 0.31mm/px · 7 of 28 slices shown]
[im 1/28]
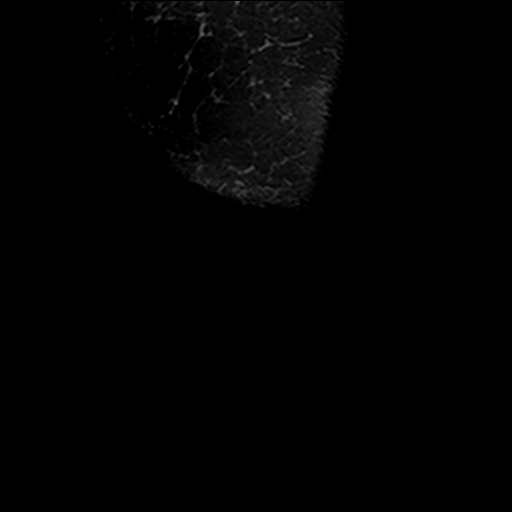
[im 5/28]
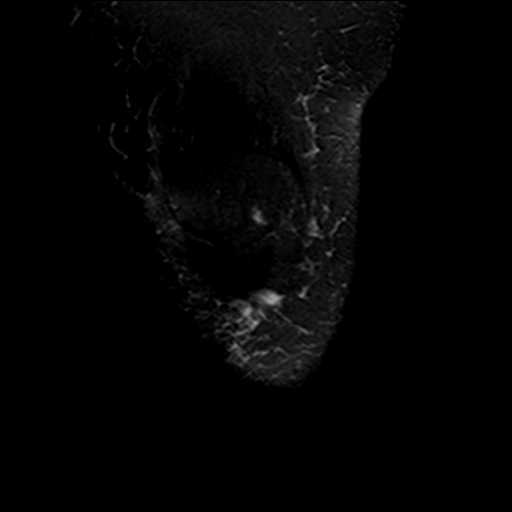
[im 10/28]
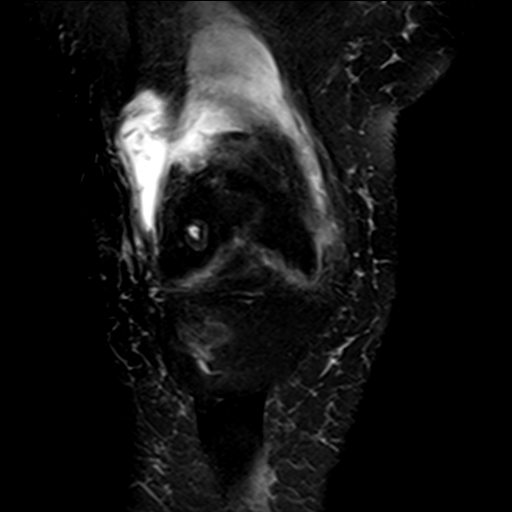
[im 14/28]
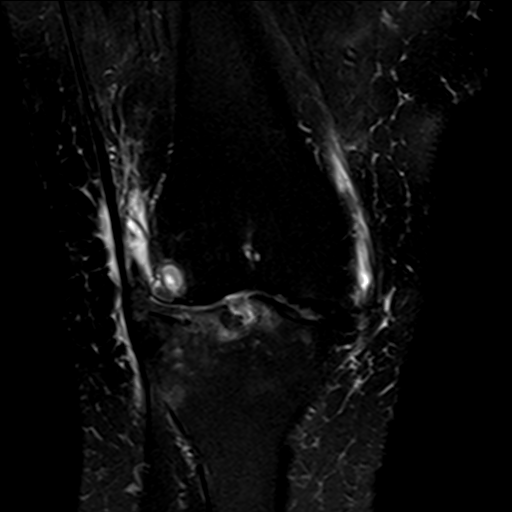
[im 19/28]
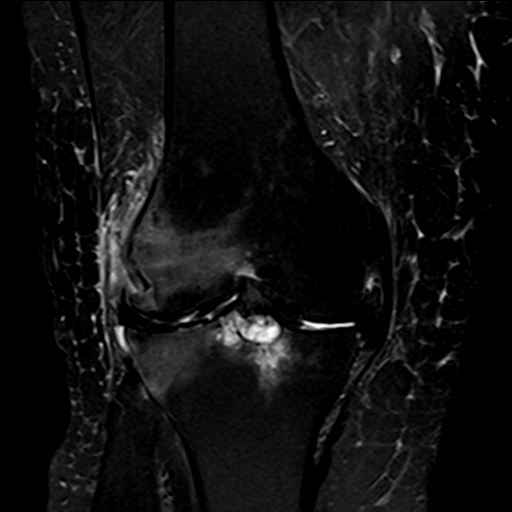
[im 23/28]
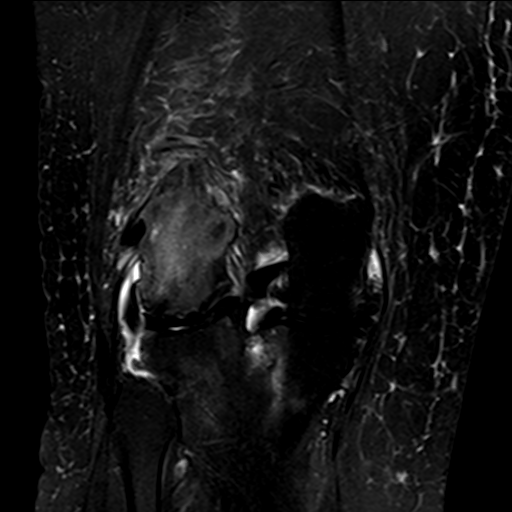
[im 28/28]
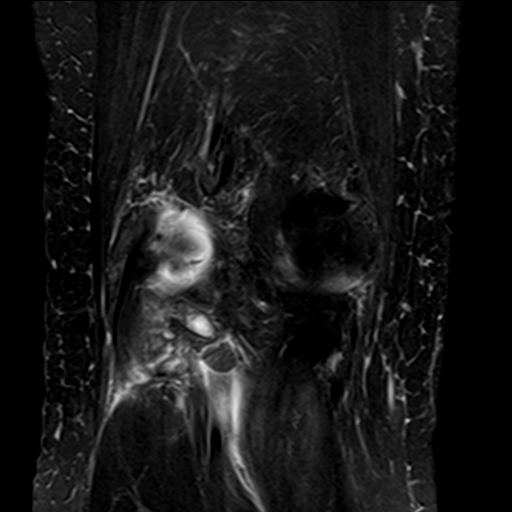

[Series 7: PD fat-sat · coronal · 3.0mm · 0.50mm/px · 7 of 28 slices shown (3 of 4)]
[im 1/28]
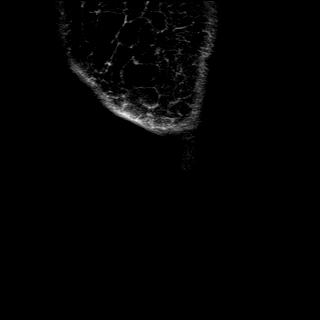
[im 5/28]
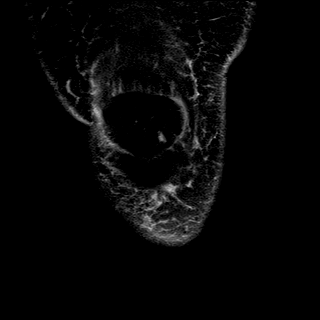
[im 10/28]
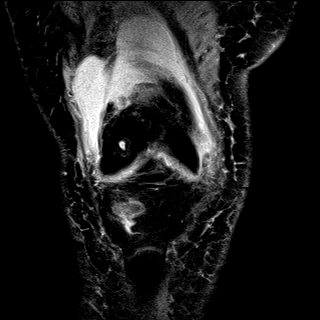
[im 14/28]
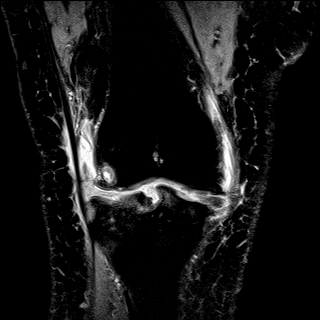
[im 19/28]
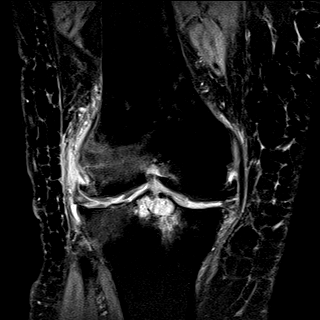
[im 23/28]
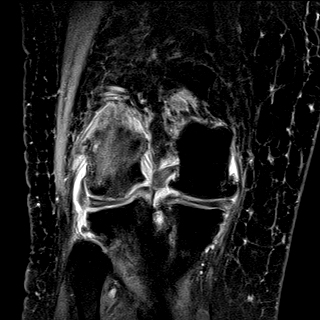
[im 28/28]
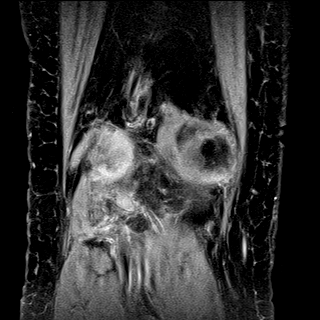

[Series 8: PD fat-sat · oblique · 2.0mm · 0.62mm/px · 2 of 9 slices shown (4 of 4)]
[im 1/9]
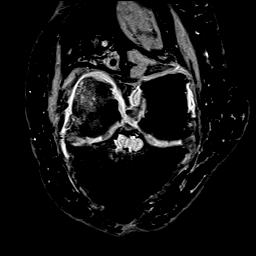
[im 9/9]
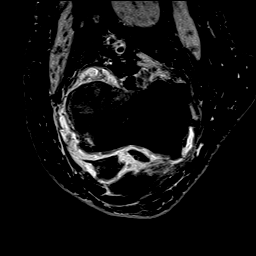

[36 of 40 positions shown; findings below may reference images not displayed]

FINDINGS: MENISCI

Medial meniscus: Extensive degenerative signal is seen in the
posterior horn and body. The bulk of the anterior horn is not
visualized consistent with degenerative maceration.

Lateral meniscus: Extensive degenerative signal is seen throughout.
The bulk of the anterior horn is not visualized consistent with
degenerative maceration.

LIGAMENTS

Cruciates: Intact. Mucoid degeneration of the ACL is present.
Associated cyst formation in the tibial eminences is noted.

Collaterals:  Intact.

CARTILAGE

Patellofemoral:  Appears denuded throughout.

Medial: Severely degenerated with areas of denuding along the tibial
plateau.

Lateral:  Severely degenerated.

Joint:  Small joint effusion.

Popliteal Fossa:  Very small Baker's cyst.

Extensor Mechanism:  Intact.

Bones: Bulky tricompartmental osteophytosis is seen. Marked
subchondral edema about the lateral compartment is identified. No
fracture or worrisome marrow lesion.

Other: None
IMPRESSION: Negative for evidence of trauma.

Severe tricompartmental osteoarthritis with degenerative maceration
of the anterior horns of both medial and lateral meniscus.

Mucoid degeneration the ACL without tear.

## 2016-10-27 ENCOUNTER — Encounter: Payer: Self-pay | Admitting: General Surgery

## 2016-10-27 ENCOUNTER — Telehealth: Payer: Self-pay | Admitting: General Surgery

## 2016-10-27 ENCOUNTER — Ambulatory Visit (INDEPENDENT_AMBULATORY_CARE_PROVIDER_SITE_OTHER): Payer: Medicare Other | Admitting: General Surgery

## 2016-10-27 VITALS — BP 131/85 | HR 137 | Temp 98.4°F | Ht 67.0 in

## 2016-10-27 DIAGNOSIS — Z4889 Encounter for other specified surgical aftercare: Secondary | ICD-10-CM

## 2016-10-27 NOTE — Progress Notes (Signed)
Outpatient Surgical Follow Up  10/27/2016  Ruth Gray is an 70 y.o. female.   Chief Complaint  Patient presents with  . Routine Post Op    Exploratory Laparotomy and Right Colon Resection- HX of Sepsis    HPI: 70 year old female returns to clinic for hospital follow-up from a recent prolonged hospital stay secondary to colonic perforation. Patient is currently being treated for decreased sources. Her primary complaints are of her care from [facility] were she's had numerous leaks of her ostial bags and gone days without having her dressing change. She has 2 IR placed drains currently and her abdomen that has had zero output for the last 2 days. Patient reports that her appetite is gradually improving in that she has been tolerating nutritional supplements. She's been having good ostomy output and denies any nausea or vomiting. She continues to have weakness and requires assistance but is improving. She denies any current fevers, chills, nausea, vomiting, chest pain, shortness breath.  Past Medical History:  Diagnosis Date  . Collagen vascular disease (HCC)   . Rheumatoid arteritis     Past Surgical History:  Procedure Laterality Date  . ABDOMINAL HYSTERECTOMY    . CENTRAL VENOUS CATHETER INSERTION  09/21/2016   Procedure: INSERTION CENTRAL LINE ADULT;  Surgeon: Tiney Rouge III, MD;  Location: [facility];  Service: General;;  . COLON RESECTION  09/21/2016   Procedure: COLON RESECTION- Ascending and Sigmoid;  Surgeon: Tiney Rouge III, MD;  Location: [facility] OR[facility];  Service: General;;  . COLOSTOMY N/A 09/25/2016   Procedure: COLOSTOMY;  Surgeon: Tiney Rouge III, MD;  Location: [facility] OR[facility];  Service: General;  Laterality: N/A;  . FOOT SURGERY    . LAPAROTOMY N/A 09/21/2016   Procedure: EXPLORATORY LAPAROTOMY;  Surgeon: Tiney Rouge III, MD;  Location: [facility] OR[facility];  Service: General;  Laterality: N/A;  . LAPAROTOMY N/A 09/25/2016   Procedure: EXPLORATORY LAPAROTOMY and right colon resection;  Surgeon:  Tiney Rouge III, MD;  Location: [facility] OR[facility];  Service: General;  Laterality: N/A;  . REPLACEMENT TOTAL KNEE Left     Family History  Problem Relation Age of Onset  . Breast cancer Other 36  . Prostate cancer Father   . Ovarian cancer Sister   . Kidney cancer Brother   . Lung cancer Sister     Social History:  reports that she has been smoking Cigarettes.  She has been smoking about 0.50 packs per day. She has never used smokeless tobacco. She reports that she does not drink alcohol. Her drug history is not on file.  Allergies:  Allergies  Allergen Reactions  . Ciprofloxacin Swelling  . Tramadol Nausea And Vomiting  . Penicillins Swelling and Rash    Has patient had a PCN reaction causing immediate rash, facial/tongue/throat swelling, SOB or lightheadedness with hypotension: {no Has patient had a PCN reaction causing severe rash involving mucus membranes or skin necrosis: no Has patient had a PCN reaction that required hospitalization no Has patient had a PCN reaction occurring within the last 10 years: no If all of the above answers are "NO", then may proceed with Cephalosporin use.     Medications reviewed.    ROS A multipoint review of systems was completed. All pertinent positives and negatives are documented within the history of present illness the remainder are negative.   BP 131/85   Pulse (!) 137   Temp 98.4 F (36.9 C) (Oral)   Ht 5\' 7"  (1.702 m)   Physical Exam Gen.: No acute distress  Neck: Supple nontender Chest: Clear to auscultation Heart: Tachycardic Abdomen: Soft, purple tender to palpation at incision sites, nondistended. Bilateral IR placed drains with empty bulbs. There is no spreading erythema from the incision sites. The right upper quadrant 1 has skin excoriation from the adhesive going up onto her right breast. Her midline is mostly well approximate. There is a 2 cm area of opening around her umbilicus the tunnel center fascia. Her fascia probes intact  and there is serous fluid coming from it. The left upper quadrant mucous fistula and right upper quadrant mucous fistula have ostomy bags in place with minimal output. The right lower quadrant end ileostomy has an ostomy bag in place that is pink, patent, productive of stool.    No results found for this or any previous visit (from the past 48 hour(s)). No results found.  Assessment/Plan:  1. Aftercare following surgery 70 year old female who is now status post multiple export her laparotomy for bowel perforation ending in in an end ileostomy, multiple mucous fistulas. IR placed drains removed today clinic. There was some difficulty in removing the sutures that were with the drains but they did eventually come out. Discussed appropriate wound care with the patient and her family. We'll use a petroleum based gauze to the skin excoriation and the drain placements should have daily dressing changes of plain gauze until the drainage ceases. Midline wound should also have a daily to twice daily change with plain gauze until it completely heals. Due to leakage from her ostomy site discussed giving her seen by the ostomy nurse at the hospital. All questions were answered to the patient's and patient's family satisfaction. They'll follow-up in 2 weeks for additional wound check.     Ricarda Frame, MD FACS General Surgeon  10/27/2016,9:47 AM

## 2016-10-27 NOTE — Telephone Encounter (Signed)
Leanne Lovely from [facility] left a voice message regarding patients appointment this morning and has some questions. Please call her at [phone]

## 2016-10-27 NOTE — Telephone Encounter (Signed)
Returned phone call to [facility] at this time. Kacey left at 1600. Spoke with current nurse for patient. Appointment information and location given for next future appointment.  Asked for Jonette Eva to call back in am if she needs anything further.

## 2016-10-27 NOTE — Patient Instructions (Signed)
The Ostomy nurse will be in touch with you . Please see your follow up appointment listed below. Please call our office if you have any questions. Please continue to do daily dressing changes to drain sites. Use petroleum gauze to area of skin breakdown under right breast. Please follow standard Ostomy care to Ostomies.

## 2016-10-29 ENCOUNTER — Emergency Department: Payer: Medicare Other

## 2016-10-29 ENCOUNTER — Emergency Department
Admission: EM | Admit: 2016-10-29 | Discharge: 2016-10-29 | Disposition: A | Discharge status: Home / Self Care | Payer: Medicare Other | Attending: Emergency Medicine | Admitting: Emergency Medicine

## 2016-10-29 DIAGNOSIS — Z7982 Long term (current) use of aspirin: Secondary | ICD-10-CM | POA: Diagnosis not present

## 2016-10-29 DIAGNOSIS — Z79899 Other long term (current) drug therapy: Secondary | ICD-10-CM | POA: Insufficient documentation

## 2016-10-29 DIAGNOSIS — M7989 Other specified soft tissue disorders: Secondary | ICD-10-CM | POA: Diagnosis present

## 2016-10-29 DIAGNOSIS — F17210 Nicotine dependence, cigarettes, uncomplicated: Secondary | ICD-10-CM | POA: Insufficient documentation

## 2016-10-29 DIAGNOSIS — I82412 Acute embolism and thrombosis of left femoral vein: Secondary | ICD-10-CM | POA: Diagnosis not present

## 2016-10-29 LAB — CBC WITH DIFFERENTIAL/PLATELET
BASOS PCT: 1 %
Basophils Absolute: 0.1 10*3/uL (ref 0–0.1)
EOS ABS: 0.6 10*3/uL (ref 0–0.7)
Eosinophils Relative: 8 %
HCT: 28.5 % — ABNORMAL LOW (ref 35.0–47.0)
Hemoglobin: 9.6 g/dL — ABNORMAL LOW (ref 12.0–16.0)
Lymphocytes Relative: 23 %
Lymphs Abs: 1.8 10*3/uL (ref 1.0–3.6)
MCH: 31.5 pg (ref 26.0–34.0)
MCHC: 33.8 g/dL (ref 32.0–36.0)
MCV: 93.3 fL (ref 80.0–100.0)
MONO ABS: 0.3 10*3/uL (ref 0.2–0.9)
MONOS PCT: 4 %
NEUTROS PCT: 64 %
Neutro Abs: 5.3 10*3/uL (ref 1.4–6.5)
PLATELETS: 154 10*3/uL (ref 150–440)
RBC: 3.06 MIL/uL — ABNORMAL LOW (ref 3.80–5.20)
RDW: 16.3 % — AB (ref 11.5–14.5)
WBC: 8.1 10*3/uL (ref 3.6–11.0)

## 2016-10-29 LAB — COMPREHENSIVE METABOLIC PANEL
ALBUMIN: 2.4 g/dL — AB (ref 3.5–5.0)
ALT: 13 U/L — ABNORMAL LOW (ref 14–54)
ANION GAP: 7 (ref 5–15)
AST: 25 U/L (ref 15–41)
Alkaline Phosphatase: 96 U/L (ref 38–126)
BUN: 22 mg/dL — ABNORMAL HIGH (ref 6–20)
CHLORIDE: 99 mmol/L — AB (ref 101–111)
CO2: 23 mmol/L (ref 22–32)
Calcium: 8.4 mg/dL — ABNORMAL LOW (ref 8.9–10.3)
Creatinine, Ser: 1.4 mg/dL — ABNORMAL HIGH (ref 0.44–1.00)
GFR calc Af Amer: 43 mL/min — ABNORMAL LOW (ref >60)
GFR calc non Af Amer: 37 mL/min — ABNORMAL LOW (ref >60)
GLUCOSE: 118 mg/dL — AB (ref 65–99)
POTASSIUM: 4 mmol/L (ref 3.5–5.1)
SODIUM: 129 mmol/L — AB (ref 135–145)
Total Bilirubin: 0.4 mg/dL (ref 0.3–1.2)
Total Protein: 7.5 g/dL (ref 6.5–8.1)

## 2016-10-29 MED ORDER — SODIUM CHLORIDE 0.9 % IV BOLUS (SEPSIS): 500.0000 mL | Freq: Once | INTRAVENOUS | Status: DC

## 2016-10-29 MED ORDER — HYDROCODONE-ACETAMINOPHEN 5-325 MG PO TABS
2.0000 | ORAL_TABLET | Freq: Once | ORAL | Status: AC
  Administered 2016-10-29: 2 via ORAL
  Filled 2016-10-29: qty 2

## 2016-10-29 MED ORDER — APIXABAN 5 MG PO TABS: 5.0000 mg | ORAL_TABLET | Freq: Two times a day (BID) | ORAL | 0 refills | Status: DC

## 2016-10-29 MED ORDER — SODIUM CHLORIDE 0.9 % IV BOLUS (SEPSIS)
500.0000 mL | Freq: Once | INTRAVENOUS | Status: AC
  Administered 2016-10-29: 500 mL via INTRAVENOUS

## 2016-10-29 MED ORDER — APIXABAN 5 MG PO TABS
5.0000 mg | ORAL_TABLET | Freq: Once | ORAL | Status: AC
  Administered 2016-10-29: 5 mg via ORAL
  Filled 2016-10-29: qty 1

## 2016-10-29 NOTE — Progress Notes (Signed)
LCSW met with patients Son Saralyn Pilar and had other son Darrick on the phone. It was explained to LCSW mother has another 2 weeks left of PT at [facility] and they were very concerned that Mom is not getting the best of care ( example given Mom colostomy bag erupted and leaked and she was left for 3 hours without any assistance). Patients son explained they were at [facility] for 1 week and she developed a bed ulcer and that's why they went to [facility]. He is present at least 2-3 x day at Facility.  In discussion with family I discussed with them to directly report to Director of Nursing and Operations and raise their current concerns and request increased care for their Mom. Patient sons were encouraged to advocate for their mother- Patient was also encouraged to voice her concerns to immediate techs and nurses.  LCSW explained that each facility has a number, that family members can call and report their concerns to ( also present in their admission packages) and that they can leave a detailed anonymous message.  LCSW provided family with list of facilities and encourage them to call to see if the facilities have bed availability and suggested the family tour the facility first.   In conclusion the family has elected to remain at [facility] and will speak to D.O.N and increase their visits over the next 2 weeks and request higher level of care/and or they themselves come more often and stay longer.  No further needs   [employer] LCSW [phone]

## 2016-10-29 NOTE — ED Notes (Signed)
Pt awaiting transport back to [facility]

## 2016-10-29 NOTE — Discharge Instructions (Signed)
Patient will require outpatient follow-up with her primary physician for deep venous thrombosis. Return emergency Department for any bleeding complications, chest pain, shortness of breath, syncopal episode, or any other new concerns.  Please return immediately if condition worsens. Please contact her primary physician or the physician you were given for referral. If you have any specialist physicians involved in her treatment and plan please also contact them. Thank you for using [facility].

## 2016-10-29 NOTE — ED Notes (Signed)
Pt and pt's son states they do not want pt to return to [facility], as they do not feel that they take good care of her. Social worker contacted and states she will be in to talk to the son

## 2016-10-29 NOTE — ED Triage Notes (Signed)
Pt from [facility] via EMS, reports L leg swelling x 24hrs, previous knee replacement 2012. Pulses present, leg warm to touch

## 2016-10-29 NOTE — ED Provider Notes (Signed)
Time Seen: Approximately 1530  I have reviewed the triage notes  Chief Complaint: Leg Swelling   History of Present Illness: Ruth Gray is a 70 y.o. female who presents with a 2 day history of some left leg swelling. Patient's had a previous knee replacement surgery back in 2012. She's also had some colon surgery for diverticulitis and bowel perforation, etc. Patient's had extensive rehabilitation after surgery. Her son states that she has had a persistently elevated heart rate and low blood pressure and that seems to be not new findings for her. She also has a history of anemia. She denies any chest pain or shortness of breath or pleuritic discomfort. She denies any right lower extremity swelling or any acute trauma.   Past Medical History:  Diagnosis Date  . Collagen vascular disease (HCC)   . Rheumatoid arteritis     Patient Active Problem List   Diagnosis Date Noted  . B12 deficiency 10/26/2016  . Osteoporosis 10/26/2016  . Protein-calorie malnutrition, severe 10/06/2016  . Acute respiratory failure (HCC)   . Perforation of cecum due to diverticulitis   . SBO (small bowel obstruction)   . Diverticulitis of large intestine without perforation or abscess without bleeding   . Acute diverticulitis 09/19/2016  . Abdominal pain 09/19/2016  . Intractable nausea and vomiting 09/19/2016  . Rheumatoid arthritis (HCC) 09/19/2016  . 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 Acute pain of right knee 06/08/2016  . Primary osteoarthritis of right knee 06/08/2016  . Coronary artery calcification seen on CAT scan 04/30/2016  . Tachycardia 01/20/2015  . Encounter for long-term (current) use of other medications 03/14/2014  . Osteoarthritis 03/07/2014    Past Surgical History:  Procedure Laterality Date  . ABDOMINAL HYSTERECTOMY    . CENTRAL VENOUS CATHETER INSERTION  09/21/2016   Procedure: INSERTION CENTRAL LINE ADULT;  Surgeon: Tiney Rougealph Ely III, MD;  Location: [facility];   Service: General;;  . COLON RESECTION  09/21/2016   Procedure: COLON RESECTION- Ascending and Sigmoid;  Surgeon: Tiney Rougealph Ely III, MD;  Location: [facility];  Service: General;;  . COLOSTOMY N/A 09/25/2016   Procedure: COLOSTOMY;  Surgeon: Tiney Rougealph Ely III, MD;  Location: [facility];  Service: General;  Laterality: N/A;  . FOOT SURGERY    . LAPAROTOMY N/A 09/21/2016   Procedure: EXPLORATORY LAPAROTOMY;  Surgeon: Tiney Rougealph Ely III, MD;  Location: [facility];  Service: General;  Laterality: N/A;  . LAPAROTOMY N/A 09/25/2016   Procedure: EXPLORATORY LAPAROTOMY and right colon resection;  Surgeon: Tiney Rougealph Ely III, MD;  Location: [facility];  Service: General;  Laterality: N/A;  . REPLACEMENT TOTAL KNEE Left     Past Surgical History:  Procedure Laterality Date  . ABDOMINAL HYSTERECTOMY    . CENTRAL VENOUS CATHETER INSERTION  09/21/2016   Procedure: INSERTION CENTRAL LINE ADULT;  Surgeon: Tiney Rougealph Ely III, MD;  Location: [facility];  Service: General;;  . COLON RESECTION  09/21/2016   Procedure: COLON RESECTION- Ascending and Sigmoid;  Surgeon: Tiney Rougealph Ely III, MD;  Location: [facility];  Service: General;;  . COLOSTOMY N/A 09/25/2016   Procedure: COLOSTOMY;  Surgeon: Tiney Rougealph Ely III, MD;  Location: [facility];  Service: General;  Laterality: N/A;  . FOOT SURGERY    . LAPAROTOMY N/A 09/21/2016   Procedure: EXPLORATORY LAPAROTOMY;  Surgeon: Tiney Rougealph Ely III, MD;  Location: [facility];  Service: General;  Laterality: N/A;  . LAPAROTOMY N/A 09/25/2016   Procedure: EXPLORATORY LAPAROTOMY and right colon resection;  Surgeon: Tiney Rougealph Ely III,  MD;  Location: [facility];  Service: General;  Laterality: N/A;  . REPLACEMENT TOTAL KNEE Left     Current Outpatient Rx  . Order #: [id number] Class: Historical Med  . Order #: [id number] Class: Historical Med  . Order #: [id number] Class: Historical Med  . Order #: [id number] Class: Historical Med  . Order #: [id number] Class: Historical Med  . Order #: [id number] Class: Historical Med  . Order #:  [id number] Class: Historical Med  . Order #: [id number] Class: Historical Med  . Order #: [id number] Class: Normal  . Order #: [id number] Class: Historical Med  . Order #: [id number] Class: Print  . Order #: [id number] Class: Historical Med  . Order #: [id number] Class: Historical Med  . Order #: [id number] Class: Normal  . Order #: [id number] Class: Historical Med  . Order #: [id number] Class: Historical Med  . Order #: [id number] Class: Normal  . Order #: [id number] Class: Normal    Allergies:  Ciprofloxacin; Tramadol; and Penicillins  Family History: Family History  Problem Relation Age of Onset  . Breast cancer Other 36  . Prostate cancer Father   . Ovarian cancer Sister   . Kidney cancer Brother   . Lung cancer Sister     Social History: Social History  Substance Use Topics  . Smoking status: Current Every Day Smoker    Packs/day: 0.50    Types: Cigarettes  . Smokeless tobacco: Never Used  . Alcohol use No     Review of Systems:   10 point review of systems was performed and was otherwise negative:  Constitutional: No fever Eyes: No visual disturbances ENT: No sore throat, ear pain Cardiac: No chest pain Respiratory: No shortness of breath, wheezing, or stridor Abdomen: No abdominal pain, no vomiting, No diarrhea Endocrine: No weight loss, No night sweats Extremities: Swelling and exclusively in the left lower extremity and review the patient's past history shows that she has had deep venous thrombosis disease without evidence of pulmonary embolism. Skin: No rashes, easy bruising Neurologic: No focal weakness, trouble with speech or swollowing Urologic: No dysuria, Hematuria, or urinary frequency She denies any melena or hematochezia.  Physical Exam:  ED Triage Vitals  Enc Vitals Group     BP 10/29/16 1506 97/66     Pulse Rate 10/29/16 1507 (!) 115     Resp 10/29/16 1506 20     Temp 10/29/16 1503 98.2 F (36.8 C)     Temp src --      SpO2 10/29/16 1507 98 %     Weight  10/29/16 1503 173 lb (78.5 kg)     Height 10/29/16 1503 5\' 6"  (1.676 m)     Head Circumference --      Peak Flow --      Pain Score 10/29/16 1503 8     Pain Loc --      Pain Edu? --      Excl. in GC? --     General: Awake , Alert , and Oriented times 3; GCS 15 Head: Normal cephalic , atraumatic Eyes: Pupils equal , round, reactive to light Nose/Throat: No nasal drainage, patent upper airway without erythema or exudate.  Neck: Supple, Full range of motion, No anterior adenopathy or palpable thyroid masses Lungs: Clear to ascultation without wheezes , rhonchi, or rales Heart: Tachycardia, regular rhythm without murmurs , gallops , or rubs Abdomen: Soft, non tender without rebound, guarding , or rigidity; bowel sounds positive and symmetric in all 4 quadrants. No organomegaly .    Lower postoperative scars with what appears to be some draining colostomy  sites. No visible melena    Extremities: Unilateral left lower extremity swelling with a well healing well aligned scars. Positive calf tenderness. Negative Homans sign. Neurologic: normal ambulation, Motor symmetric without deficits, sensory intact Skin: warm, dry, no rashes   Labs:   All laboratory work was reviewed including any pertinent negatives or positives listed below:  Labs Reviewed  CBC WITH DIFFERENTIAL/PLATELET  COMPREHENSIVE METABOLIC PANEL  Laboratory work was reviewed and showed no clinically significant abnormalities.    Radiology:  "Dg Abd 1 View  Result Date: 10/06/2016 CLINICAL DATA:  Nasogastric tube placement.  Initial encounter. EXAM: ABDOMEN - 1 VIEW COMPARISON:  CT of the abdomen and pelvis performed 10/04/2016 FINDINGS: The patient's enteric tube is noted ending overlying the body of the stomach. The visualized bowel gas pattern is nonspecific. Mildly distended small bowel loops are seen. The stomach is partially filled with air and fluid. Postoperative change is noted along the midline abdominal wall. No  acute osseous abnormalities are seen. IMPRESSION: 1. Enteric tube noted ending overlying the body of the stomach. 2. Nonspecific bowel gas pattern. Mildly distended small bowel loops seen. Stomach partially filled with fluid and air. Electronically Signed   By: Roanna Raider M.D.   On: 10/06/2016 20:41   Ct Abdomen Pelvis W Contrast  Result Date: 10/04/2016 CLINICAL DATA:  70 year old female postop day 13, status post ileostomy. Evaluate for abscess. EXAM: CT ABDOMEN AND PELVIS WITH CONTRAST TECHNIQUE: Multidetector CT imaging of the abdomen and pelvis was performed using the standard protocol following bolus administration of intravenous contrast. CONTRAST:  ISOVUE-300 IOPAMIDOL (ISOVUE-300) INJECTION 61% COMPARISON:  09/21/2016 FINDINGS: Lower chest: Small pleural effusions are identified with overlying areas of consolidation in atelectasis involving the lower lobe. Hepatobiliary: No focal liver abnormality. The gallbladder appears normal. No biliary dilatation. Pancreas: Unremarkable. No pancreatic ductal dilatation or surrounding inflammatory changes. Spleen: Normal in size without focal abnormality. Adrenals/Urinary Tract: The adrenal glands appear normal. Unremarkable appearance of the kidneys. Catheter is identified within the urinary bladder. Stomach/Bowel: Normal appearance of the stomach. There is mild increase caliber of the small bowel loops which measure up to 3.2 cm, image 51 of series 2. Right lower quadrant ileostomy is identified. 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 Vascular/Lymphatic: Aortic atherosclerosis noted. Multiple sub cm upper abdominal lymph nodes are identified. No pelvic or inguinal adenopathy. Reproductive: Status post hysterectomy. No adnexal masses. Other: There is mesenteric edema and ascites identified within the abdomen and pelvis. Loculated fluid within the left hemi abdomen measure 4.7 x 8.7 x  4.8 cm. There is a large fluid collection within the right hemi abdomen which extends over the dome of liver. This measures approximately 20.8 cm in cranial caudal dimension, image 39 of series 5. Small fluid collection within the right lower quadrant of the abdomen is identified measuring 3.8 cm, image number 45 of series 5. Within the central pelvis there is a interloop fluid collection which measures 3.4 cm, image 73 of series 2. Interloop fluid collection within the left hemi abdomen is also noted measuring 5.4 cm, image 37 of series 2. Musculoskeletal: There is diffuse body wall edema identified. 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 No aggressive lytic or sclerotic bone lesions. IMPRESSION: 1. Postoperative appearance of the bowel compatible with right lower quadrant ileostomy and mucous fistula formation. 2. Multifocal fluid collections are identified within the abdomen and pelvis compatible with multiple abscesses. 3. Increase  caliber of the small bowel loops likely reflecting ileus. 4. Anasarca 5. Bilateral pleural effusions. These results will be called to the ordering clinician or representative by the Radiologist Assistant, and communication documented in the PACS or zVision Dashboard. Electronically Signed   By: Signa Kell M.D.   On: 10/04/2016 12:21   US Venous Img Lower Unilateral Left  Result Date: 10/29/2016 CLINICAL DATA:  Left leg swelling. EXAM: LEFT LOWER EXTREMITY VENOUS DOPPLER ULTRASOUND TECHNIQUE: Gray-scale sonography with graded compression, as well as color Doppler and duplex ultrasound, were performed to evaluate the deep venous system from the level of the common femoral vein through the popliteal and proximal calf veins. Spectral Doppler was utilized to evaluate flow at rest and with distal augmentation maneuvers. COMPARISON:  None. FINDINGS: Right common femoral vein is patent without thrombus. There is occlusive thrombus throughout the left lower  extremity. There is thrombus in the left common femoral vein, left profunda femoral vein, left saphenofemoral junction, left femoral vein, left popliteal vein and the visualized left posterior tibial veins and left peroneal veins. The left common femoral vein is enlarged and this is suggestive for acute thrombus. IMPRESSION: Positive for deep venous thrombosis throughout the left lower extremity. Clot extends from the left calf to the left common femoral vein. Cannot exclude extension into the left iliac veins. Electronically Signed   By: Richarda Overlie M.D.   On: 10/29/2016 17:02   Dg Chest Port 1 View  Result Date: 10/11/2016 CLINICAL DATA:  PICC placement.  Initial encounter. EXAM: PORTABLE CHEST 1 VIEW COMPARISON:  Chest radiograph performed 09/26/2016 FINDINGS: The patient's left PICC is noted ending about the distal SVC. A right IJ line is also noted ending about the distal SVC. An enteric tube is noted ending coiled within the stomach. Minimal right basilar atelectasis is noted. A small right pleural effusion is suspected. No pneumothorax is seen. The cardiomediastinal silhouette is normal in size. No acute osseous abnormalities are identified. IMPRESSION: 1. Left PICC noted ending about the distal SVC. 2. Minimal right basilar atelectasis noted. Suspect small right pleural effusion. Electronically Signed   By: Roanna Raider M.D.   On: 10/11/2016 19:02   Ct Image Guided Drainage By Percutaneous Catheter  Result Date: 10/05/2016 INDICATION: Bilateral peritoneal fluid collections. EXAM: CT GUIDED DRAINAGE OF bilateral ABSCESSes. MEDICATIONS: The patient is currently admitted to the hospital and receiving intravenous antibiotics. The antibiotics were administered within an appropriate time frame prior to the initiation of the procedure. ANESTHESIA/SEDATION: 3.0 mg IV Versed 150 mcg IV Fentanyl Moderate Sedation Time:  36 The patient was continuously monitored during the procedure by the interventional  radiology nurse under my direct supervision. COMPLICATIONS: None immediate. TECHNIQUE: Informed written consent was obtained from the patient after a thorough discussion of the procedural risks, benefits and alternatives. All questions were addressed. Maximal Sterile Barrier Technique was utilized including caps, mask, sterile gowns, sterile gloves, sterile drape, hand hygiene and skin antiseptic. A timeout was performed prior to the initiation of the procedure. PROCEDURE: The left lower quadrant was prepped with chlorhexidine in a sterile fashion, and a sterile drape was applied covering the operative field. Sterile gloves were used for the procedure. Local anesthesia was provided with 1% Lidocaine. Under CT guidance, 18 gauge needle was directed into left lower quadrant fluid collection. Guidewire was placed followup by progressively larger dilators. Eight French drainage catheter was placed and internally secured. Approximately 120 mL of serous fluid was removed and sample was sent to lab. Catheter was  externally secured and hooked up to JP suction bulb drainage. The right upper quadrant of the abdomen was prepped and draped with chlorhexidine. Sterile drape was placed. Local anesthetic was applied. Under CT guidance, 18 gauge needle was directed into right subdiaphragmatic fluid collection. Guidewire was placed followup by progressively larger dilators. Eight French drainage catheter was placed and internally secured. Approximately foreign and 20 mL of serous fluid was removed and sample was sent to lab. Catheter was externally secured and hooked up to JP suction bulb drainage. FINDINGS: Follow-up CT imaging demonstrates both fluid collections to be significantly decompressed status post catheter placement. IMPRESSION: Under CT guidance, successful placement of percutaneous drainage catheters into fluid collections in left lower quadrant and right subdiaphragmatic regions. Electronically Signed   By: Lupita Raider, M.D.   On: 10/05/2016 14:56   Ct Image Guided Drainage By Percutaneous Catheter  Result Date: 10/05/2016 INDICATION: Bilateral peritoneal fluid collections. EXAM: CT GUIDED DRAINAGE OF bilateral ABSCESSes. MEDICATIONS: The patient is currently admitted to the hospital and receiving intravenous antibiotics. The antibiotics were administered within an appropriate time frame prior to the initiation of the procedure. ANESTHESIA/SEDATION: 3.0 mg IV Versed 150 mcg IV Fentanyl Moderate Sedation Time:  4 The patient was continuously monitored during the procedure by the interventional radiology nurse under my direct supervision. COMPLICATIONS: None immediate. TECHNIQUE: Informed written consent was obtained from the patient after a thorough discussion of the procedural risks, benefits and alternatives. All questions were addressed. Maximal Sterile Barrier Technique was utilized including caps, mask, sterile gowns, sterile gloves, sterile drape, hand hygiene and skin antiseptic. A timeout was performed prior to the initiation of the procedure. PROCEDURE: The left lower quadrant was prepped with chlorhexidine in a sterile fashion, and a sterile drape was applied covering the operative field. Sterile gloves were used for the procedure. Local anesthesia was provided with 1% Lidocaine. Under CT guidance, 18 gauge needle was directed into left lower quadrant fluid collection. Guidewire was placed followup by progressively larger dilators. Eight French drainage catheter was placed and internally secured. Approximately 120 mL of serous fluid was removed and sample was sent to lab. Catheter was externally secured and hooked up to JP suction bulb drainage. The right upper quadrant of the abdomen was prepped and draped with chlorhexidine. Sterile drape was placed. Local anesthetic was applied. Under CT guidance, 18 gauge needle was directed into right subdiaphragmatic fluid collection. Guidewire was placed followup by  progressively larger dilators. Eight French drainage catheter was placed and internally secured. Approximately foreign and 20 mL of serous fluid was removed and sample was sent to lab. Catheter was externally secured and hooked up to JP suction bulb drainage. FINDINGS: Follow-up CT imaging demonstrates both fluid collections to be significantly decompressed status post catheter placement. IMPRESSION: Under CT guidance, successful placement of percutaneous drainage catheters into fluid collections in left lower quadrant and right subdiaphragmatic regions. Electronically Signed   By: Lupita Raider, M.D.   On: 10/05/2016 14:56  "  I personally reviewed the radiologic studies     ED Course: * Patient presents with some mild tachycardia which is felt was due to dehydration though review of her records shows that she does have persistent tachycardia going back to mid November. The patient's does not appear to have significant anemia. Patient was started on Elaquis here in emergency department. She does not express any clinical signs or symptoms consistent with a pulmonary embolism and I felt inpatient management was not necessary at this time.  Clinical Course      Assessment Left lower extremity deep venous thrombosis      Plan:  Outpatient management " Discharge Medication List as of 10/29/2016  5:30 PM    START taking these medications   Details  apixaban (ELIQUIS) 5 MG TABS tablet Take 1 tablet (5 mg total) by mouth 2 (two) times daily., Starting Fri 10/29/2016, Print      " Patient was advised to return immediately if condition worsens. Patient was advised to follow up with their primary care physician or other specialized physicians involved in their outpatient care. The patient and/or family member/power of attorney had laboratory results reviewed at the bedside. All questions and concerns were addressed and appropriate discharge instructions were distributed by the nursing  staff. Jennye Moccasin, MD 10/29/16 [phone]

## 2016-11-03 ENCOUNTER — Ambulatory Visit (INDEPENDENT_AMBULATORY_CARE_PROVIDER_SITE_OTHER): Payer: Medicare Other | Admitting: Surgery

## 2016-11-03 ENCOUNTER — Encounter: Payer: Self-pay | Admitting: Surgery

## 2016-11-03 VITALS — BP 113/78 | HR 122 | Temp 98.8°F | Ht 66.0 in | Wt 173.0 lb

## 2016-11-03 DIAGNOSIS — Z09 Encounter for follow-up examination after completed treatment for conditions other than malignant neoplasm: Secondary | ICD-10-CM

## 2016-11-03 NOTE — Progress Notes (Signed)
Surgical f/u on 10/31 she underwent exploratory laparotomy for perforated bowel. Intraoperatively the cecum was perforated and there was also an area of the sigmoid colon that was stenotic from diverticulitis. Dr. Michela Pitcher resected the descending colon and give the patient and end ileostomy on the right lower quadrant. He also brought a mucous fistula from the descending colon in the right upper quadrant. He did resect some of the sigmoid and brought a 10 leak as fistula on the left lower quadrant. A rectal stump was left in place. She is debilitated and had a prolonged postoperative course. More recently was diagnosed with a DVT which she was started on anticoagulation.  He does feel dehydrated and is putting out good amounts from the ileostomy. She is taking PO. Currently in a NH  PE NAD Dry mucosa Abd: soft, NT, three ostomy bas in place no evidence of complication, small 2 cms open woun midline w good granulation tissue, No peritonitis  A/P Dehydration from ileostomy D/W HN and we will order 1 lt LR NH MD will re-evaluate pt in am, no need for surgical intervention but we do need to have close f/u from medical perspective and NH MD will assess and address medical issues.

## 2016-11-05 ENCOUNTER — Telehealth: Payer: Self-pay

## 2016-11-05 NOTE — Telephone Encounter (Signed)
Spoke with Belinda at this time. The patient was supposed to be scheduled for an appointment with an ostomy nurse. They haven't heard anything from them or from [place]. It's been 10 days already and they are starting to get worried. Belinda from [facility] [place] has been changing them for the patient. She is calling to see if we can get this appointment set up. Please call Belinda at [phone] and advice.

## 2016-11-05 NOTE — Telephone Encounter (Signed)
Called Belinda at [phone] from [facility] [place]. However, I had to leave a message letting her know that Ruth Gray, Ostomy RN will be contacting her on 11/16/2016. Christella Noa stated that she would tell Massie Bougie and his son.

## 2016-11-09 ENCOUNTER — Telehealth: Payer: Self-pay | Admitting: General Surgery

## 2016-11-09 ENCOUNTER — Ambulatory Visit (INDEPENDENT_AMBULATORY_CARE_PROVIDER_SITE_OTHER): Payer: Medicare Other | Admitting: Surgery

## 2016-11-09 ENCOUNTER — Encounter: Payer: Self-pay | Admitting: Surgery

## 2016-11-09 VITALS — BP 109/68 | HR 119 | Temp 98.4°F | Ht 66.0 in | Wt 178.0 lb

## 2016-11-09 DIAGNOSIS — Z932 Ileostomy status: Secondary | ICD-10-CM

## 2016-11-09 DIAGNOSIS — K5720 Diverticulitis of large intestine with perforation and abscess without bleeding: Secondary | ICD-10-CM

## 2016-11-09 DIAGNOSIS — Z934 Other artificial openings of gastrointestinal tract status: Secondary | ICD-10-CM

## 2016-11-09 DIAGNOSIS — K5732 Diverticulitis of large intestine without perforation or abscess without bleeding: Secondary | ICD-10-CM

## 2016-11-09 NOTE — Telephone Encounter (Signed)
Massie Bougie has called from [facility] in reference to patient. The ostomy was changed twice on her shift yesterday and she will be changing this again today for the second time. The drainage is leaking on the ostomy bag causing it not to stay. The two other ostomy bags are remaining empty, however the bottom with the stoma is the one having the problem.  Please provide information to [facility]. She thinks the patient should be seen.  Please call Belinda at [phone].

## 2016-11-09 NOTE — Telephone Encounter (Signed)
Spoke with Belinda at this time. She states she is not so much worried with the ostomy bag leaking as she is with the amount of fluid that is draining from the incision. She denies any redness at this time. Denies fever.  Spoke to Dr.Piscoya and patient is added to afternoon schedule.

## 2016-11-09 NOTE — Patient Instructions (Addendum)
Please see specific instructions for doctors orders on the Report Of Consultation Sheet.  Please see your follow up appointment listed below. Please call our office if you have any questions or concerns.

## 2016-11-09 NOTE — Progress Notes (Signed)
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 Michela Pitcher for perforated diverticulitis.  Nursing home called today due to concerns for bleeding/drainage from midline incision as well as leakage around the ileostomy.  Patient reports that she has been feeling well without any fevers, chills, nausea, or vomiting.  The ileostomy is working well without high output concerns.  She has been tolerating po diet and eating better and has been improving her ambulation.  She reports minimal drainage from the mucous fistulas and these have ostomy appliances over both.  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  Mucosa appears viable and intact.  RLQ end ileostomy with healthy mucosa as well, with a 2x1 cm area inferior to stoma with excoriated skin.  Midline incision with 2 open wounds, upper measuring 3x2 cm and lower measuing 1 cm diameter.  Both wounds meet under skin in a large subcutaneous cavity.  Some purulent fluid expressed from inferior portion but otherwise no evidence of infection and no surrounding erythema.  No bleeding either.  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  Rather than placing ostomy appliances, can cover each with Telfa (non-adhesive) gauze and tape and change dressing daily or as needed.  If significant output, can change back to smaller ostomy appliances. --RLQ end ileostomy needs better care.  Today,  have used Mastisol applied to the surrounding skin to help with adhesion and sealing and placed new appliance.  The opening of the appliance needs to be cut to shape of the stoma so no skin is exposed.  If Mastisol is not enough adhesive, may need Stomahesive Paste.  Overall, need to assure good seal to prevent further excoriation. --Midline incision needs packing of upper and lower wounds.  Upper wound can be packed with regular gauze, pushing the gauze downwards toward the inferior  wound.  The lower wound can be packed with 1/2 inch iodoform gauze, pushing the gauze upward toward the upper wound.  This should be changed twice daily. --Patient will follow up next week to reassess her wounds and ostomy.   79, MD [facility]

## 2016-11-17 ENCOUNTER — Other Ambulatory Visit: Payer: Self-pay

## 2016-11-17 ENCOUNTER — Ambulatory Visit (INDEPENDENT_AMBULATORY_CARE_PROVIDER_SITE_OTHER): Payer: Medicare Other | Admitting: Surgery

## 2016-11-17 ENCOUNTER — Encounter: Payer: Self-pay | Admitting: Surgery

## 2016-11-17 VITALS — BP 106/70 | HR 121 | Temp 99.0°F | Ht 66.0 in | Wt 173.0 lb

## 2016-11-17 DIAGNOSIS — K5720 Diverticulitis of large intestine with perforation and abscess without bleeding: Secondary | ICD-10-CM

## 2016-11-17 NOTE — Patient Instructions (Addendum)
We will see you back in 2 weeks as scheduled below.  I will try to find out who Home Care nursing and Physical Therapy are with and contact them directly. They should be contacting you shortly. If you do not hear from them by Friday, please call our office.  If you have any questions or concerns at all, please call our office.

## 2016-11-17 NOTE — Progress Notes (Signed)
11/17/2016  HPI: Patient is status post exploratory laparotomy with partial colon resection and creation of end ileostomy and mucous fistula asked of the ascending colon and descending colon with Dr. Michela Pitcher for perforated diverticulitis. She presents today for follow-up appointment. She was seen on 12/19 due to some concerns of drainage from her midline incision as well as leakage around the ileostomy site. She has now been discharged from her nursing home and is at her house with assistance of her son. She reports that she still having some leakage around the ostomy which has improved and otherwise continues to have drainage over the midline incision. Otherwise denies having any fevers, chills, chest pain, shortness of breath, nausea, vomiting.  Vital signs: BP 106/70   Pulse (!) 121   Temp 99 F (37.2 C) (Oral)   Ht 5\' 6"  (1.676 m)   Wt 78.5 kg (173 lb)   BMI 27.92 kg/m    Physical Exam: Constitutional: No acute distress Abdomen: Soft, nondistended, nontender to palpation. Patient has left upper and right upper quadrant mucous fistulas were which are viable with pink mucosa and minimal drainage. Patient has a right lower quadrant end ileostomy with healthy mucosa as well area there is a small area inferior to the stoma with the denuded skin but otherwise no evidence of infection. The patient's midline incision consistent of 2 wounds as in the prior exam with the upper wound being bigger than the lower wound. There is serosanguineous drainage with no further purulence. 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 -Her midline incision was repacked  with 4 x 4 gauze and the top and 2 x 2 gauze on the bottom in order to improve absorption of her drainage. This will be changed twice a day and possibly 3 times a day depending on the amount of drainage. -The upper left and right mucous fistulas can continue having Telfa dressings to minimize adherence of the gauze to the mucosa. -Patient may follow-up in 2 weeks to reassess her wounds and ostomy.   79, MD [facility]

## 2016-11-19 ENCOUNTER — Telehealth: Payer: Self-pay

## 2016-11-19 NOTE — Telephone Encounter (Signed)
Revonda Standard called from home health requesting an order to be put in for this patient. Harveen has an abdominal wound and needs to be educated on wound care and colotomy care. Revonda Standard would like to see the patient twice a week for nine weeks. All in all, Revonda Standard is wanting orders to be put in for Occupational therapy, a Child psychotherapist, and home health aid twice a week for nine weeks to help the patient bathe and provide wound care. Please call Revonda Standard back at [phone]

## 2016-11-23 NOTE — Telephone Encounter (Signed)
Spoke with Revonda Standard afternoon of 12/29 and verbal orders given for all services mentioned. Last office note faxed to [facility] with current dressing change instructions.   Awaiting written orders to be sent at this time.

## 2016-11-24 ENCOUNTER — Telehealth: Payer: Self-pay | Admitting: General Surgery

## 2016-11-24 ENCOUNTER — Telehealth: Payer: Self-pay

## 2016-11-24 NOTE — Telephone Encounter (Signed)
Spoke with [facility] Nurse) for patient who explained that patient is having increased abdominal pain and will not let LPN touch her abdomen to clean any of her incisions today. She also has an area between 3-7 o'clock at current Ostomy site that is pulling away from abdominal wall and purulent drainage from bottom of midline incision.  Spoke with Dr. Excell Seltzer regarding this and he advised that patient needs to be seen in Emergency Room so that on-call surgeon may come down and look at patient's abdomen and abdominal pain can be addressed.

## 2016-11-24 NOTE — Telephone Encounter (Signed)
Spoke with patient's son at this time. He states that Revonda Standard, the nurse came out and packed the mucous fistulas as well as midline incision and states that patient was caused great pain during this. He says that she is not having any abdominal pain or drainage currently and the ostomy appears the same as it has been since patient was discharged from hospital. Denies nausea/vomiting and fever/chills.  I explained to patient's son that if he does not feel that patient is in distress then he can bring her to her scheduled appointment with Dr. Tonita Cong on 12/02/16. But he is encouraged to call with any questions or concerns that he develops.  He verbalizes understanding of this.

## 2016-11-24 NOTE — Telephone Encounter (Signed)
Luisa Hart called and I read him the nurse notes recommending Ruth Gray to go to the ER.  He is now calling back stating that there has been a confusion between the nurse at home care and [place]. Please call patrick back and advice.

## 2016-11-24 NOTE — Telephone Encounter (Signed)
Returned phone call to [place] at this time. Unable to leave voicemail at this time as voicemail is not set up.

## 2016-11-24 NOTE — Telephone Encounter (Signed)
Patients son, Rowene Suto, called and stated that the home health nurse was concerned regarding drainage from her EXPLORATORY LAPAROTOMY and right colon resection. The drainage is pink in color, no fever and no pain. Please call Luisa Hart at [phone]

## 2016-11-26 ENCOUNTER — Telehealth: Payer: Self-pay

## 2016-11-26 DIAGNOSIS — K5720 Diverticulitis of large intestine with perforation and abscess without bleeding: Secondary | ICD-10-CM

## 2016-11-26 NOTE — Telephone Encounter (Signed)
Spoke with patient at this time. Patient and son had some concerns due to the incision closing up on the lower end and unable to pack this area. We discussed placing a dressing over the area and not to force packing into small opening as he had been doing but to continue packing it from the top. We discussed this is part of the healing process and eventually the top will take less packing until it closes completely. Patient was reminded of appointment 12/02/16 @ 11:00. Patient verbalized understanding.

## 2016-11-26 NOTE — Telephone Encounter (Signed)
Luisa Hart (son) called stating that his mothers' (patient) hole where the bottom drain is at is so small that he can't get the Q- tip in anymore, he is having to use back side of the Q-tip to pack the wound in. He is afraid that the wound will be all the way closed by monday. Please call patients son and advice.

## 2016-11-29 DIAGNOSIS — I824Z2 Acute embolism and thrombosis of unspecified deep veins of left distal lower extremity: Secondary | ICD-10-CM | POA: Insufficient documentation

## 2016-11-30 ENCOUNTER — Other Ambulatory Visit: Payer: Self-pay | Admitting: Infectious Diseases

## 2016-11-30 DIAGNOSIS — D72825 Bandemia: Secondary | ICD-10-CM

## 2016-11-30 DIAGNOSIS — K5720 Diverticulitis of large intestine with perforation and abscess without bleeding: Secondary | ICD-10-CM

## 2016-11-30 NOTE — Telephone Encounter (Signed)
Patient's son, Luisa Hart has called and states that he has some concerns.   The patient is having fecal matter coming out of upper right ostomy that just started today.   He would like a call back to discuss this.   Phone number--Patrick [phone].

## 2016-11-30 NOTE — Telephone Encounter (Signed)
Spoke with patient's son, Ruth Gray. He states that patient is having a bowel movements from her rectum and is concerned. I did explain to patient's son that this was completely normal and to expect this periodically throughout the time that she has the Colostomy in place. Son verbalizes understanding of this.   Ruth Gray also states that patient was seen at Dr. Jarrett Ables office for [facility] yesterday and due to the elevated White Count, he has requested a CT scan which will be done tomorrow per patient. He had ordered with Contrast but patient's son is concerned about this due to her Kidney Function.  Spoke with Dr. Tonita Cong about this and we has ordered to change the CT scan to ONLY Oral Contrast. This has been done and Central Scheduling and CT Scan has been notified of this change. I also explained this to patient's son.  We will follow-up in office with patient as soon as CT has been completed on 12/02/16.

## 2016-11-30 NOTE — Telephone Encounter (Signed)
Returned phone to patient's Son, Luisa Hart at this time. No answer. Unable to leave message as voicemail is not set-up.

## 2016-12-02 ENCOUNTER — Ambulatory Visit
Admission: RE | Admit: 2016-12-02 | Discharge: 2016-12-02 | Disposition: A | Discharge status: Home / Self Care | Payer: Medicare Other | Source: Ambulatory Visit | Attending: Infectious Diseases | Admitting: Infectious Diseases

## 2016-12-02 ENCOUNTER — Encounter: Payer: Self-pay | Admitting: General Surgery

## 2016-12-02 ENCOUNTER — Encounter: Payer: Self-pay | Admitting: Surgery

## 2016-12-02 ENCOUNTER — Ambulatory Visit (INDEPENDENT_AMBULATORY_CARE_PROVIDER_SITE_OTHER): Payer: Medicare Other | Admitting: General Surgery

## 2016-12-02 ENCOUNTER — Inpatient Hospital Stay
Admission: EM | Admit: 2016-12-02 | Discharge: 2016-12-06 | DRG: 857 | Disposition: A | Discharge status: Home Health | Payer: Medicare Other | Attending: General Surgery | Admitting: General Surgery

## 2016-12-02 VITALS — BP 123/72 | HR 118 | Temp 98.2°F | Ht 66.0 in | Wt 173.0 lb

## 2016-12-02 DIAGNOSIS — L02211 Cutaneous abscess of abdominal wall: Secondary | ICD-10-CM | POA: Diagnosis present

## 2016-12-02 DIAGNOSIS — F17210 Nicotine dependence, cigarettes, uncomplicated: Secondary | ICD-10-CM | POA: Diagnosis present

## 2016-12-02 DIAGNOSIS — K5720 Diverticulitis of large intestine with perforation and abscess without bleeding: Secondary | ICD-10-CM

## 2016-12-02 DIAGNOSIS — I708 Atherosclerosis of other arteries: Secondary | ICD-10-CM

## 2016-12-02 DIAGNOSIS — M069 Rheumatoid arthritis, unspecified: Secondary | ICD-10-CM | POA: Diagnosis present

## 2016-12-02 DIAGNOSIS — K219 Gastro-esophageal reflux disease without esophagitis: Secondary | ICD-10-CM | POA: Diagnosis present

## 2016-12-02 DIAGNOSIS — Z801 Family history of malignant neoplasm of trachea, bronchus and lung: Secondary | ICD-10-CM | POA: Diagnosis not present

## 2016-12-02 DIAGNOSIS — Z888 Allergy status to other drugs, medicaments and biological substances status: Secondary | ICD-10-CM

## 2016-12-02 DIAGNOSIS — M47896 Other spondylosis, lumbar region: Secondary | ICD-10-CM | POA: Insufficient documentation

## 2016-12-02 DIAGNOSIS — R1084 Generalized abdominal pain: Secondary | ICD-10-CM

## 2016-12-02 DIAGNOSIS — Z90710 Acquired absence of both cervix and uterus: Secondary | ICD-10-CM | POA: Diagnosis not present

## 2016-12-02 DIAGNOSIS — M6281 Muscle weakness (generalized): Secondary | ICD-10-CM

## 2016-12-02 DIAGNOSIS — Z803 Family history of malignant neoplasm of breast: Secondary | ICD-10-CM | POA: Diagnosis not present

## 2016-12-02 DIAGNOSIS — Z932 Ileostomy status: Secondary | ICD-10-CM | POA: Diagnosis not present

## 2016-12-02 DIAGNOSIS — K632 Fistula of intestine: Secondary | ICD-10-CM

## 2016-12-02 DIAGNOSIS — T814XXA Infection following a procedure, initial encounter: Principal | ICD-10-CM | POA: Diagnosis present

## 2016-12-02 DIAGNOSIS — Z8051 Family history of malignant neoplasm of kidney: Secondary | ICD-10-CM

## 2016-12-02 DIAGNOSIS — Z880 Allergy status to penicillin: Secondary | ICD-10-CM

## 2016-12-02 DIAGNOSIS — Z9889 Other specified postprocedural states: Secondary | ICD-10-CM | POA: Insufficient documentation

## 2016-12-02 DIAGNOSIS — Z881 Allergy status to other antibiotic agents status: Secondary | ICD-10-CM | POA: Diagnosis not present

## 2016-12-02 DIAGNOSIS — Z8041 Family history of malignant neoplasm of ovary: Secondary | ICD-10-CM | POA: Diagnosis not present

## 2016-12-02 DIAGNOSIS — I700 Atherosclerosis of aorta: Secondary | ICD-10-CM | POA: Insufficient documentation

## 2016-12-02 HISTORY — DX: Perforation of intestine (nontraumatic): K63.1

## 2016-12-02 LAB — CBC WITH DIFFERENTIAL/PLATELET
BASOS PCT: 0 %
Basophils Absolute: 0 10*3/uL (ref 0–0.1)
EOS ABS: 0.1 10*3/uL (ref 0–0.7)
Eosinophils Relative: 1 %
HCT: 30.5 % — ABNORMAL LOW (ref 35.0–47.0)
HEMOGLOBIN: 9.8 g/dL — AB (ref 12.0–16.0)
Lymphocytes Relative: 17 %
Lymphs Abs: 1.6 10*3/uL (ref 1.0–3.6)
MCH: 30.1 pg (ref 26.0–34.0)
MCHC: 32.1 g/dL (ref 32.0–36.0)
MCV: 93.9 fL (ref 80.0–100.0)
MONOS PCT: 11 %
Monocytes Absolute: 1 10*3/uL — ABNORMAL HIGH (ref 0.2–0.9)
NEUTROS ABS: 6.4 10*3/uL (ref 1.4–6.5)
NEUTROS PCT: 71 %
Platelets: 476 10*3/uL — ABNORMAL HIGH (ref 150–440)
RBC: 3.25 MIL/uL — AB (ref 3.80–5.20)
RDW: 18.6 % — ABNORMAL HIGH (ref 11.5–14.5)
WBC: 9 10*3/uL (ref 3.6–11.0)

## 2016-12-02 LAB — COMPREHENSIVE METABOLIC PANEL
ALBUMIN: 2.6 g/dL — AB (ref 3.5–5.0)
ALK PHOS: 74 U/L (ref 38–126)
ALT: 10 U/L — AB (ref 14–54)
ANION GAP: 8 (ref 5–15)
AST: 30 U/L (ref 15–41)
BILIRUBIN TOTAL: 0.9 mg/dL (ref 0.3–1.2)
BUN: 13 mg/dL (ref 6–20)
CALCIUM: 8.5 mg/dL — AB (ref 8.9–10.3)
CO2: 25 mmol/L (ref 22–32)
CREATININE: 0.94 mg/dL (ref 0.44–1.00)
Chloride: 102 mmol/L (ref 101–111)
GFR calc Af Amer: >60 mL/min (ref >60)
GFR calc non Af Amer: >60 mL/min (ref >60)
GLUCOSE: 119 mg/dL — AB (ref 65–99)
Potassium: 3.4 mmol/L — ABNORMAL LOW (ref 3.5–5.1)
SODIUM: 135 mmol/L (ref 135–145)
TOTAL PROTEIN: 7.9 g/dL (ref 6.5–8.1)

## 2016-12-02 LAB — PROTIME-INR
INR: 1.47
PROTHROMBIN TIME: 18 s — AB (ref 11.4–15.2)

## 2016-12-02 LAB — MAGNESIUM: Magnesium: 1.7 mg/dL (ref 1.7–2.4)

## 2016-12-02 LAB — APTT: aPTT: 44 seconds — ABNORMAL HIGH (ref 24–36)

## 2016-12-02 LAB — PHOSPHORUS: Phosphorus: 3.6 mg/dL (ref 2.5–4.6)

## 2016-12-02 MED ORDER — HEPARIN SODIUM (PORCINE) 5000 UNIT/ML IJ SOLN
5000.0000 [IU] | Freq: Three times a day (TID) | INTRAMUSCULAR | Status: AC
  Administered 2016-12-02 – 2016-12-03 (×4): 5000 [IU] via SUBCUTANEOUS
  Filled 2016-12-02 (×4): qty 1

## 2016-12-02 MED ORDER — DIPHENHYDRAMINE HCL 12.5 MG/5ML PO ELIX
12.5000 mg | ORAL_SOLUTION | Freq: Four times a day (QID) | ORAL | Status: DC | PRN
  Administered 2016-12-04: 12.5 mg via ORAL
  Filled 2016-12-02: qty 5

## 2016-12-02 MED ORDER — VANCOMYCIN HCL IN DEXTROSE 1-5 GM/200ML-% IV SOLN
1000.0000 mg | INTRAVENOUS | Status: AC
  Administered 2016-12-02: 1000 mg via INTRAVENOUS
  Filled 2016-12-02: qty 200

## 2016-12-02 MED ORDER — MEROPENEM-SODIUM CHLORIDE 1 GM/50ML IV SOLR
1.0000 g | INTRAVENOUS | Status: AC
  Administered 2016-12-02: 1 g via INTRAVENOUS
  Filled 2016-12-02: qty 50

## 2016-12-02 MED ORDER — SODIUM CHLORIDE 0.9 % IV SOLN: 1.0000 g | Freq: Three times a day (TID) | INTRAVENOUS | Status: DC

## 2016-12-02 MED ORDER — ONDANSETRON 4 MG PO TBDP: 4.0000 mg | ORAL_TABLET | Freq: Four times a day (QID) | ORAL | Status: DC | PRN

## 2016-12-02 MED ORDER — VANCOMYCIN HCL IN DEXTROSE 750-5 MG/150ML-% IV SOLN
750.0000 mg | Freq: Two times a day (BID) | INTRAVENOUS | Status: DC
  Administered 2016-12-02 – 2016-12-04 (×4): 750 mg via INTRAVENOUS
  Filled 2016-12-02 (×9): qty 150

## 2016-12-02 MED ORDER — MORPHINE SULFATE (PF) 4 MG/ML IV SOLN: 4.0000 mg | INTRAVENOUS | Status: DC | PRN

## 2016-12-02 MED ORDER — PANTOPRAZOLE SODIUM 40 MG IV SOLR
40.0000 mg | Freq: Every day | INTRAVENOUS | Status: DC
  Administered 2016-12-02 – 2016-12-05 (×4): 40 mg via INTRAVENOUS
  Filled 2016-12-02 (×4): qty 40

## 2016-12-02 MED ORDER — ONDANSETRON HCL 4 MG/2ML IJ SOLN: 4.0000 mg | Freq: Four times a day (QID) | INTRAMUSCULAR | Status: DC | PRN

## 2016-12-02 MED ORDER — CHLORHEXIDINE GLUCONATE CLOTH 2 % EX PADS
6.0000 | MEDICATED_PAD | Freq: Once | CUTANEOUS | Status: AC
  Administered 2016-12-03: 6 via TOPICAL

## 2016-12-02 MED ORDER — CHLORHEXIDINE GLUCONATE CLOTH 2 % EX PADS
6.0000 | MEDICATED_PAD | Freq: Once | CUTANEOUS | Status: AC
  Administered 2016-12-02: 6 via TOPICAL

## 2016-12-02 MED ORDER — HYDRALAZINE HCL 20 MG/ML IJ SOLN: 10.0000 mg | INTRAMUSCULAR | Status: DC | PRN

## 2016-12-02 MED ORDER — MEROPENEM-SODIUM CHLORIDE 1 GM/50ML IV SOLR: 1.0000 g | Freq: Three times a day (TID) | INTRAVENOUS | Status: DC

## 2016-12-02 MED ORDER — MEROPENEM-SODIUM CHLORIDE 1 GM/50ML IV SOLR
1.0000 g | Freq: Three times a day (TID) | INTRAVENOUS | Status: DC
  Administered 2016-12-02 – 2016-12-05 (×8): 1 g via INTRAVENOUS
  Filled 2016-12-02 (×10): qty 50

## 2016-12-02 MED ORDER — DIPHENHYDRAMINE HCL 50 MG/ML IJ SOLN
12.5000 mg | Freq: Four times a day (QID) | INTRAMUSCULAR | Status: DC | PRN
  Filled 2016-12-02: qty 1

## 2016-12-02 MED ORDER — LACTATED RINGERS IV SOLN
INTRAVENOUS | Status: DC
  Administered 2016-12-02 – 2016-12-03 (×3): via INTRAVENOUS

## 2016-12-02 NOTE — ED Triage Notes (Signed)
Pt arrived via POV. Pt sent by Dr. Tonita Cong for direct admission to repair colostomy.

## 2016-12-02 NOTE — Patient Instructions (Addendum)
We will send you to our Emergency Room so that we can start antibiotics and IV fluids and then direct admit you to the Hospital from there. The surgeon is calling the ER to discuss this with them.

## 2016-12-02 NOTE — Progress Notes (Addendum)
Patient admitted and patient is alert and oriented, ambulates with a walker.Vital signs stable, Patient signed consent for surgery. Patient on a regular diet for dinner and to have nothing by mouth tonight after midnight.

## 2016-12-02 NOTE — H&P (Signed)
Patient ID: Ruth Gray, female   DOB: November 14, 1946, 71 y.o.   MRN: [id number]  History of Present Illness Ruth Gray is a 72 y.o. 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 Michela Pitcher for perforated diverticulitis 10/31. She has had a prolonged course that required nursing home placement. Now comes into the office and was seen by my partner Dr. Tonita Cong early today. He reports high output from the ileostomy and some drainage from the midline wound. Has some subjective fevers. Taking Po , no emesis Ct scan reviewed anterior midline collection c/w abscess. INR 1.4 Past Medical History Past Medical History:  Diagnosis Date  . Collagen vascular disease (HCC)   . Rheumatoid arteritis       Past Surgical History:  Procedure Laterality Date  . ABDOMINAL HYSTERECTOMY    . CENTRAL VENOUS CATHETER INSERTION  09/21/2016   Procedure: INSERTION CENTRAL LINE ADULT;  Surgeon: Tiney Rouge III, MD;  Location: [facility] ORS;  Service: General;;  . COLON RESECTION  09/21/2016   Procedure: COLON RESECTION- Ascending and Sigmoid;  Surgeon: Tiney Rouge III, MD;  Location: [facility] ORS;  Service: General;;  . COLOSTOMY N/A 09/25/2016   Procedure: COLOSTOMY;  Surgeon: Tiney Rouge III, MD;  Location: [facility] ORS;  Service: General;  Laterality: N/A;  . FOOT SURGERY    . LAPAROTOMY N/A 09/21/2016   Procedure: EXPLORATORY LAPAROTOMY;  Surgeon: Tiney Rouge III, MD;  Location: [facility] ORS;  Service: General;  Laterality: N/A;  . LAPAROTOMY N/A 09/25/2016   Procedure: EXPLORATORY LAPAROTOMY and right colon resection;  Surgeon: Tiney Rouge III, MD;  Location: [facility] ORS;  Service: General;  Laterality: N/A;  . REPLACEMENT TOTAL KNEE Left     Allergies  Allergen Reactions  . Ciprofloxacin Swelling  . Penicillins Swelling and Rash    Has patient had a PCN reaction causing immediate rash, facial/tongue/throat swelling,  SOB or lightheadedness with hypotension: yes Has patient had a PCN reaction causing severe rash involving mucus membranes or skin necrosis: no Has patient had a PCN reaction that required hospitalization no Has patient had a PCN reaction occurring within the last 10 years: no If all of the above answers are "NO", then may proceed with Cephalosporin use.   . Tramadol Nausea And Vomiting    Current Facility-Administered Medications  Medication Dose Route Frequency Provider Last Rate Last Dose  . Chlorhexidine Gluconate Cloth 2 % PADS 6 each  6 each Topical Once Leafy Ro, MD       And  . Chlorhexidine Gluconate Cloth 2 % PADS 6 each  6 each Topical Once [place] F Kurstyn Larios, MD      . diphenhydrAMINE (BENADRYL) 12.5 MG/5ML elixir 12.5 mg  12.5 mg Oral Q6H PRN Ricarda Frame, MD       Or  . diphenhydrAMINE (BENADRYL) injection 12.5 mg  12.5 mg Intravenous Q6H PRN Ricarda Frame, MD      . heparin injection 5,000 Units  5,000 Units Subcutaneous Q8H Ricarda Frame, MD      . hydrALAZINE (APRESOLINE) injection 10 mg  10 mg Intravenous Q2H PRN Ricarda Frame, MD      . lactated ringers infusion   Intravenous Continuous Ricarda Frame, MD 125 mL/hr at 12/02/16 1425    . meropenem (MERREM) 1 g in sodium chloride 0.9 % 100 mL IVPB  1 g Intravenous Q8H Scott Sofie Rower, RPH      .  meropenem (MERREM) IVPB SOLR 1 g  1 g Intravenous STAT Valentina Gu, RPH      . morphine 4 MG/ML injection 4 mg  4 mg Intravenous Q4H PRN Ricarda Frame, MD      . ondansetron (ZOFRAN-ODT) disintegrating tablet 4 mg  4 mg Oral Q6H PRN Ricarda Frame, MD       Or  . ondansetron [facility]) injection 4 mg  4 mg Intravenous Q6H PRN Ricarda Frame, MD      . pantoprazole (PROTONIX) injection 40 mg  40 mg Intravenous QHS Ricarda Frame, MD      . vancomycin (VANCOCIN) IVPB 1000 mg/200 mL premix  1,000 mg Intravenous STAT Valentina Gu, RPH 200 mL/hr at 12/02/16 1425 1,000 mg at 12/02/16 1425  . vancomycin (VANCOCIN) IVPB  750 mg/150 ml premix  750 mg Intravenous Q12H Valentina Gu, [facility]       Current Outpatient Prescriptions  Medication Sig Dispense Refill  . acetaminophen (TYLENOL) 500 MG tablet Take 1,000 mg by mouth every 8 (eight) hours as needed.     Marland Kitchen apixaban (ELIQUIS) 5 MG TABS tablet Take 1 tablet (5 mg total) by mouth 2 (two) times daily. 60 tablet 0  . calcium carbonate (OSCAL) 1500 (600 Ca) MG TABS tablet Take 600 mg of elemental calcium by mouth daily with breakfast.    . carbonyl iron (FEOSOL) 45 MG TABS tablet Take 45 mg by mouth daily.     Marland Kitchen inFLIXimab (REMICADE) 100 MG injection Inject into the vein.      Family History Family History  Problem Relation Age of Onset  . Breast cancer Other 36  . Prostate cancer Father   . Ovarian cancer Sister   . Kidney cancer Brother   . Lung cancer Sister       Social History Social History  Substance Use Topics  . Smoking status: Current Every Day Smoker    Packs/day: 0.50    Types: Cigarettes  . Smokeless tobacco: Never Used  . Alcohol use No       ROS neg  Physical Exam Height 5\' 7"  (1.702 m), weight 80.7 kg (178 lb).  CONSTITUTIONAL:Elderly and debilitated female that is dehydrated EYES: Pupils equal, round, and reactive to light, Sclera non-icteric. EARS, NOSE, MOUTH AND THROAT: The oropharynx is clear. Oral mucosa is pink and moist. Hearing is intact to voice.  NECK: Trachea is midline, and there is no jugular venous distension. Thyroid is without palpable abnormalities. LYMPH NODES:  Lymph nodes in the neck are not enlarged. RESPIRATORY:  Lungs are clear, and breath sounds are equal bilaterally. Normal respiratory effort without pathologic use of accessory muscles. CARDIOVASCULAR: Heart is regular without murmurs, gallops, or rubs. GI: The abdomen is  soft, mild midline tenderness, ileostomy functioning and viable mucous fistulas. Multiple opening with some purulent drainage. MUSCULOSKELETAL:  Normal muscle strength and tone in  all four extremities.    SKIN: Skin turgor is normal. There are no pathologic skin lesions.  NEUROLOGIC:  Motor and sensation is grossly normal.  Cranial nerves are grossly intact. PSYCH:  Alert and oriented to person, place and time. Affect is normal.  Data Reviewed   I have personally reviewed the patient's imaging and medical records.    Assessment/Plan Admission abdominal wall abscess. We'll go ahead and admit her place her in broad-spectrum antibiotics and resuscitated her tonight. And no need for immediate surgical intervention at this point. We'll go ahead and schedule her for an I&D under anesthesia tomorrow given  that she is very sensitive and I anticipate that I may need to do a mild debridement. She understands and wishes to proceed. The patient was performed of the risks, benefits and possible complications of the procedure. All questions were answered Sterling Big, MD FACS  Maxamilian Amadon F Nikkolas Coomes 12/02/2016, 3:20 PM

## 2016-12-02 NOTE — Progress Notes (Signed)
Outpatient Surgical Follow Up  12/02/2016  Ruth Gray is an 70 y.o. female.   Chief Complaint  Patient presents with  . Routine Post Op    Right Colon Resection (10/05/16)- Dr. Michela Pitcher    HPI: 71 year old female returns to clinic for follow-up from her recent colon resection secondary to perforation. She has been feeling ill lately and has had a decreased appetite with increase in nausea. She has been subjectively febrile at home but has not been taking her temperature. She is having stool output from her right upper quadrant mucous fistula and copious output from her end ileostomy. She continues to have drainage from her midline that is purulent in appearance. She states she is somewhat better than she was a month ago but worse and she was 2 weeks ago. She denies any chest pain, shortness of breath.  Past Medical History:  Diagnosis Date  . Collagen vascular disease (HCC)   . Rheumatoid arteritis     Past Surgical History:  Procedure Laterality Date  . ABDOMINAL HYSTERECTOMY    . CENTRAL VENOUS CATHETER INSERTION  09/21/2016   Procedure: INSERTION CENTRAL LINE ADULT;  Surgeon: Tiney Rouge III, MD;  Location: [facility];  Service: General;;  . COLON RESECTION  09/21/2016   Procedure: COLON RESECTION- Ascending and Sigmoid;  Surgeon: Tiney Rouge III, MD;  Location: [facility];  Service: General;;  . COLOSTOMY N/A 09/25/2016   Procedure: COLOSTOMY;  Surgeon: Tiney Rouge III, MD;  Location: [facility];  Service: General;  Laterality: N/A;  . FOOT SURGERY    . LAPAROTOMY N/A 09/21/2016   Procedure: EXPLORATORY LAPAROTOMY;  Surgeon: Tiney Rouge III, MD;  Location: [facility];  Service: General;  Laterality: N/A;  . LAPAROTOMY N/A 09/25/2016   Procedure: EXPLORATORY LAPAROTOMY and right colon resection;  Surgeon: Tiney Rouge III, MD;  Location: [facility];  Service: General;  Laterality: N/A;  . REPLACEMENT TOTAL KNEE Left     Family History  Problem Relation Age of Onset  . Breast cancer Other 36  .  Prostate cancer Father   . Ovarian cancer Sister   . Kidney cancer Brother   . Lung cancer Sister     Social History:  reports that she has been smoking Cigarettes.  She has been smoking about 0.50 packs per day. She has never used smokeless tobacco. She reports that she does not drink alcohol or use drugs.  Allergies:  Allergies  Allergen Reactions  . Ciprofloxacin Swelling  . Penicillins Swelling and Rash    Has patient had a PCN reaction causing immediate rash, facial/tongue/throat swelling, SOB or lightheadedness with hypotension: {no Has patient had a PCN reaction causing severe rash involving mucus membranes or skin necrosis: no Has patient had a PCN reaction that required hospitalization no Has patient had a PCN reaction occurring within the last 10 years: no If all of the above answers are "NO", then may proceed with Cephalosporin use.   . Tramadol Nausea And Vomiting    Medications reviewed.    ROS A multipoint review of systems was completed. All pertinent positives and negatives are documented in the history of present illness and remainder are negative.   BP 123/72   Pulse (!) 118   Temp 98.2 F (36.8 C) (Oral)   Ht 5\' 6"  (1.676 m)   Wt 78.5 kg (173 lb)   BMI 27.92 kg/m   Physical Exam Gen.: Resting in bed in obvious discomfort Neck: Supple and nontender Chest: Clear to auscultation bilaterally Heart: Tachycardic  Abdomen: Soft, tender to palpation at all incision sites, nondistended. Right lower quadrant ileostomy functioning normally. Right upper quadrant mucous fistula with stool output. Midline incision with multiple openings and purulent output. Left lower quadrant mucous fistula with minimal mucus output. Extremity: Bilateral extremities with 1+ edema but moves them well.    No results found for this or any previous visit (from the past 48 hour(s)). Ct Abdomen Pelvis Wo Contrast  Result Date: 12/02/2016 CLINICAL DATA:  Elevated WBC, status post  perforated diverticulitis and ileostomy EXAM: CT ABDOMEN AND PELVIS WITHOUT CONTRAST TECHNIQUE: Multidetector CT imaging of the abdomen and pelvis was performed following the standard protocol without IV contrast. COMPARISON:  CT scan 10/04/2016 FINDINGS: Lower chest: Lung bases shows mild bronchiectasis in right lower lobe. No acute findings. Atherosclerotic calcifications of coronary arteries and thoracic aorta. Heart size within normal limits. No pericardial effusion. Tiny hiatal hernia is noted. Hepatobiliary: The unenhanced liver shows no biliary ductal dilatation. Probable mild geographic fatty infiltration. Pancreas: Unenhanced pancreas is mild atrophic fatty replaced without focal abnormality. Spleen: Unenhanced spleen is normal. Adrenals/Urinary Tract: No adrenal gland mass. Unenhanced kidneys are symmetrical in size. No nephrolithiasis. No hydronephrosis or hydroureter. No calcified ureteral calculi. No calcified calculi are noted within empty urinary bladder. Stomach/Bowel: Again noted postsurgical changes with ileostomy in right lower quadrant. No small bowel obstruction. Contrast material noted within ileostomy bag without evidence of small bowel stricture or obstruction. Stable postsurgical changes post colonic mucous fistula formation in right and left anterior abdomen. No thickening of colonic wall to suggest acute colitis or diverticulitis. Vascular/Lymphatic: Atherosclerotic calcifications of abdominal aorta and iliac arteries. Atherosclerotic calcifications of SMA. No adenopathy. Reproductive: The patient is status post hysterectomy. No pelvic mass or adenopathy. Other: Axial image 58 there is a midline deep anterior abdominal wall collection with some complex fluid and air bubbles. This is best seen in sagittal image 83 measures about 6.7 cm cranial caudally by 2.1 cm AP diameter. Apparently there is an opening probable fistulous or draining site at level of umbilicus. Please see sagittal image  82. Clinical correlation is necessary to exclude abdominal wall draining abscess. The underlying abdominal fascia appears intact. There is no evidence of connection with intraabdominal cavity. Musculoskeletal: Extensive degenerative changes are noted lumbar spine. Degenerative changes are noted thoracic spine. Significant disc space flattening endplate sclerotic changes anterior and posterior spurring at L2-L3 level. Significant disc space flattening with vacuum disc phenomenon at L3-L4 and L4-L5 level. Mild spinal canal stenosis due to posterior spurring at L2-L3 level. IMPRESSION: 1. Axial image 58 there is a midline deep anterior abdominal wall collection with some complex fluid and air bubbles. This is best seen in sagittal image 83 measures about 6.7 cm cranial caudally by 2.1 cm AP diameter. Apparently there is an opening probable fistulous or draining site at level of umbilicus. Please see sagittal image 82. Clinical correlation is necessary to exclude abdominal wall draining abscess. 2. Stable postsurgical changes with ileostomy in right lower quadrant. Stable postsurgical changes with colonic mucous fistula. No evidence of small bowel or colonic obstruction. No evidence of acute colitis or diverticulitis. 3. No hydronephrosis or hydroureter. 4. Extensive atherosclerotic calcifications of abdominal aorta iliac arteries and SMA. 5. Extensive degenerative changes lumbar spine. Electronically Signed   By: Natasha Mead M.D.   On: 12/02/2016 10:13    Assessment/Plan:  Mid abdominal abscess: 71 year old female in the postoperative period from a complicated perforated colon. Now with what appears to be an abdominal wall abscess to  her midline. It is partially draining near her umbilicus but it is continuing to pull and I believe this is what is making her feel ill. This is likely cause of her leukocytosis noted by her primary care provider earlier this week that prompted the CT scan to be obtained. Patient has  not been taking much by mouth inset she constantly feels dry. She is continuing to have copious ileostomy output which is normal per ileostomy. The stool output from her mucous fistula is likely just decompression of her transverse colon that is still full of stool. Given her clinical appearance and how she is currently feeling discussed that a direct admission was warranted. However, there are no available beds in the hospital at this time. Discussed with the ER that we will transfer to the ER so they can start her on IV fluids and IV antibiotics to treat her infection. She'll then be admitted with a likely due to surgical drainage of her midline abscess tomorrow. She understands that her care will be taken over by my partner Dr. Everlene Farrier once she is fully admitted.    Ricarda Frame, MD FACS General Surgeon  12/02/2016,12:19 PM

## 2016-12-02 NOTE — Progress Notes (Signed)
Pharmacy Antibiotic Note  Ruth Gray is a 71 y.o. female s/p recent colon resection admitted on 12/02/2016 with mid abdominal abscess at sight of surgical wound.  Pharmacy has been consulted for vancomycin and meropnem dosing.  Plan: Vancomycin 1000 mg iv stat then vancomycin 750 IV every 12 hours with stacked dosing and a trough with the 5th dose.  Goal trough 15-20 mcg/mL.  Meropenem 1 g iv q 8 hours.   Height: 5\' 7"  (170.2 cm) Weight: 178 lb (80.7 kg) IBW/kg (Calculated) : 61.6  Temp (24hrs), Avg:98.2 F (36.8 C), Min:98.2 F (36.8 C), Max:98.2 F (36.8 C)   Recent Labs Lab 12/02/16 1315  WBC 9.0  CREATININE 0.94    Estimated Creatinine Clearance: 61.7 mL/min (by C-G formula based on SCr of 0.94 mg/dL).    Allergies  Allergen Reactions  . Ciprofloxacin Swelling  . Penicillins Swelling and Rash    Has patient had a PCN reaction causing immediate rash, facial/tongue/throat swelling, SOB or lightheadedness with hypotension: yes Has patient had a PCN reaction causing severe rash involving mucus membranes or skin necrosis: no Has patient had a PCN reaction that required hospitalization no Has patient had a PCN reaction occurring within the last 10 years: no If all of the above answers are "NO", then may proceed with Cephalosporin use.   . Tramadol Nausea And Vomiting    Antimicrobials this admission: Vancomycin 1/11 >> Meropenem 1/11 >>  Dose adjustments this admission:   Microbiology results:  Thank you for allowing pharmacy to be a part of this patient's care.  3/11 D 12/02/2016 2:29 PM

## 2016-12-03 ENCOUNTER — Inpatient Hospital Stay: Payer: Medicare Other | Admitting: Certified Registered"

## 2016-12-03 ENCOUNTER — Encounter: Payer: Self-pay | Admitting: *Deleted

## 2016-12-03 ENCOUNTER — Encounter
Admission: EM | Disposition: A | Discharge status: Home Health | Payer: Self-pay | Source: Home / Self Care | Attending: General Surgery

## 2016-12-03 DIAGNOSIS — L02211 Cutaneous abscess of abdominal wall: Secondary | ICD-10-CM

## 2016-12-03 HISTORY — PX: DEBRIDEMENT OF ABDOMINAL WALL ABSCESS: SHX6396

## 2016-12-03 LAB — BASIC METABOLIC PANEL
ANION GAP: 3 — AB (ref 5–15)
BUN: 11 mg/dL (ref 6–20)
CALCIUM: 7.9 mg/dL — AB (ref 8.9–10.3)
CO2: 26 mmol/L (ref 22–32)
CREATININE: 0.95 mg/dL (ref 0.44–1.00)
Chloride: 107 mmol/L (ref 101–111)
GFR, EST NON AFRICAN AMERICAN: 60 mL/min — AB (ref >60)
Glucose, Bld: 111 mg/dL — ABNORMAL HIGH (ref 65–99)
Potassium: 3.9 mmol/L (ref 3.5–5.1)
SODIUM: 136 mmol/L (ref 135–145)

## 2016-12-03 LAB — CBC
HCT: 24.6 % — ABNORMAL LOW (ref 35.0–47.0)
Hemoglobin: 8.1 g/dL — ABNORMAL LOW (ref 12.0–16.0)
MCH: 31.2 pg (ref 26.0–34.0)
MCHC: 32.8 g/dL (ref 32.0–36.0)
MCV: 95 fL (ref 80.0–100.0)
PLATELETS: 310 10*3/uL (ref 150–440)
RBC: 2.59 MIL/uL — AB (ref 3.80–5.20)
RDW: 17.7 % — ABNORMAL HIGH (ref 11.5–14.5)
WBC: 8.5 10*3/uL (ref 3.6–11.0)

## 2016-12-03 LAB — SURGICAL PCR SCREEN
MRSA, PCR: NEGATIVE
STAPHYLOCOCCUS AUREUS: NEGATIVE

## 2016-12-03 LAB — MAGNESIUM: MAGNESIUM: 1.6 mg/dL — AB (ref 1.7–2.4)

## 2016-12-03 LAB — PHOSPHORUS: PHOSPHORUS: 2.9 mg/dL (ref 2.5–4.6)

## 2016-12-03 SURGERY — DEBRIDEMENT OF ABDOMINAL WALL ABSCESS
Anesthesia: General | Wound class: Contaminated

## 2016-12-03 MED ORDER — ONDANSETRON HCL 4 MG/2ML IJ SOLN
INTRAMUSCULAR | Status: AC
  Filled 2016-12-03: qty 2

## 2016-12-03 MED ORDER — OXYCODONE HCL 5 MG PO TABS: 5.0000 mg | ORAL_TABLET | Freq: Once | ORAL | Status: DC | PRN

## 2016-12-03 MED ORDER — DEXAMETHASONE SODIUM PHOSPHATE 10 MG/ML IJ SOLN
INTRAMUSCULAR | Status: DC | PRN
  Administered 2016-12-03: 5 mg via INTRAVENOUS

## 2016-12-03 MED ORDER — KETOROLAC TROMETHAMINE 15 MG/ML IJ SOLN
INTRAMUSCULAR | Status: DC | PRN
  Administered 2016-12-03: 15 mg via INTRAVENOUS

## 2016-12-03 MED ORDER — LIDOCAINE HCL (PF) 2 % IJ SOLN
INTRAMUSCULAR | Status: DC | PRN
  Administered 2016-12-03: 50 mg

## 2016-12-03 MED ORDER — PROPOFOL 10 MG/ML IV BOLUS
INTRAVENOUS | Status: DC | PRN
  Administered 2016-12-03: 20 mg via INTRAVENOUS
  Administered 2016-12-03: 120 mg via INTRAVENOUS

## 2016-12-03 MED ORDER — MAGNESIUM SULFATE 2 GM/50ML IV SOLN
2.0000 g | Freq: Once | INTRAVENOUS | Status: AC
  Administered 2016-12-03: 2 g via INTRAVENOUS
  Filled 2016-12-03: qty 50

## 2016-12-03 MED ORDER — GLYCOPYRROLATE 0.2 MG/ML IJ SOLN
INTRAMUSCULAR | Status: DC | PRN
  Administered 2016-12-03: 0.1 mg via INTRAVENOUS

## 2016-12-03 MED ORDER — ONDANSETRON HCL 4 MG/2ML IJ SOLN
INTRAMUSCULAR | Status: DC | PRN
  Administered 2016-12-03: 4 mg via INTRAVENOUS

## 2016-12-03 MED ORDER — LACTATED RINGERS IV SOLN
INTRAVENOUS | Status: DC
Start: 2016-12-03 — End: 2016-12-06
  Administered 2016-12-03 – 2016-12-05 (×3): via INTRAVENOUS

## 2016-12-03 MED ORDER — FENTANYL CITRATE (PF) 100 MCG/2ML IJ SOLN
INTRAMUSCULAR | Status: AC
  Filled 2016-12-03: qty 2

## 2016-12-03 MED ORDER — PHENYLEPHRINE 40 MCG/ML (10ML) SYRINGE FOR IV PUSH (FOR BLOOD PRESSURE SUPPORT)
PREFILLED_SYRINGE | INTRAVENOUS | Status: AC
  Filled 2016-12-03: qty 10

## 2016-12-03 MED ORDER — APIXABAN 2.5 MG PO TABS
5.0000 mg | ORAL_TABLET | Freq: Two times a day (BID) | ORAL | Status: DC
  Administered 2016-12-04 – 2016-12-06 (×5): 5 mg via ORAL
  Filled 2016-12-03 (×3): qty 1
  Filled 2016-12-03: qty 2
  Filled 2016-12-03 (×4): qty 1

## 2016-12-03 MED ORDER — PHENYLEPHRINE HCL 10 MG/ML IJ SOLN
INTRAMUSCULAR | Status: DC | PRN
  Administered 2016-12-03 (×5): 80 ug via INTRAVENOUS

## 2016-12-03 MED ORDER — FENTANYL CITRATE (PF) 100 MCG/2ML IJ SOLN
INTRAMUSCULAR | Status: DC | PRN
  Administered 2016-12-03: 25 ug via INTRAVENOUS
  Administered 2016-12-03: 50 ug via INTRAVENOUS
  Administered 2016-12-03: 25 ug via INTRAVENOUS

## 2016-12-03 MED ORDER — PROPOFOL 10 MG/ML IV BOLUS
INTRAVENOUS | Status: AC
  Filled 2016-12-03: qty 20

## 2016-12-03 MED ORDER — KETOROLAC TROMETHAMINE 30 MG/ML IJ SOLN
INTRAMUSCULAR | Status: AC
  Filled 2016-12-03: qty 1

## 2016-12-03 MED ORDER — LIDOCAINE 2% (20 MG/ML) 5 ML SYRINGE
INTRAMUSCULAR | Status: AC
  Filled 2016-12-03: qty 5

## 2016-12-03 MED ORDER — OXYCODONE HCL 5 MG/5ML PO SOLN: 5.0000 mg | Freq: Once | ORAL | Status: DC | PRN

## 2016-12-03 MED ORDER — FENTANYL CITRATE (PF) 100 MCG/2ML IJ SOLN: 25.0000 ug | INTRAMUSCULAR | Status: DC | PRN

## 2016-12-03 MED ORDER — BOOST / RESOURCE BREEZE PO LIQD
1.0000 | Freq: Three times a day (TID) | ORAL | Status: DC
  Administered 2016-12-03 – 2016-12-06 (×9): 1 via ORAL

## 2016-12-03 SURGICAL SUPPLY — 19 items
BLADE CLIPPER SURG (BLADE) ×2 IMPLANT
BLADE SURG 15 STRL LF DISP TIS (BLADE) ×1 IMPLANT
BLADE SURG 15 STRL SS (BLADE) ×2
BNDG CMPR 75X41 PLY HI ABS (GAUZE/BANDAGES/DRESSINGS) ×1
BNDG STRETCH 4X75 STRL LF (GAUZE/BANDAGES/DRESSINGS) ×1 IMPLANT
CANISTER SUCT 3000ML (MISCELLANEOUS) ×2 IMPLANT
DRSG TEGADERM 4X4.75 (GAUZE/BANDAGES/DRESSINGS) ×6 IMPLANT
ELECT REM PT RETURN 9FT ADLT (ELECTROSURGICAL) ×2
ELECTRODE REM PT RTRN 9FT ADLT (ELECTROSURGICAL) ×1 IMPLANT
GLOVE BIO SURGEON STRL SZ7 (GLOVE) ×10 IMPLANT
GOWN STRL REUS W/ TWL LRG LVL3 (GOWN DISPOSABLE) ×2 IMPLANT
GOWN STRL REUS W/TWL LRG LVL3 (GOWN DISPOSABLE) ×8
NEEDLE HYPO 22GX1.5 SAFETY (NEEDLE) ×2 IMPLANT
NS IRRIG 1000ML POUR BTL (IV SOLUTION) ×2 IMPLANT
PACK BASIN MINOR ARMC (MISCELLANEOUS) ×2 IMPLANT
SCRUB POVIDONE IODINE 4 OZ (MISCELLANEOUS) ×2 IMPLANT
SOL PREP PVP 2OZ (MISCELLANEOUS) ×2
SOLUTION PREP PVP 2OZ (MISCELLANEOUS) ×1 IMPLANT
SPONGE LAP 18X18 5 PK (GAUZE/BANDAGES/DRESSINGS) ×2 IMPLANT

## 2016-12-03 NOTE — Progress Notes (Signed)
Initial Nutrition Assessment  DOCUMENTATION CODES:   Not applicable  INTERVENTION:  Continue Boost Breeze po TID as ordered by Surgery, each supplement provides 250 kcal and 9 grams of protein.  Patient would also benefit from multivitamin with minerals daily in setting of increased ileostomy losses.   Will monitor intake to determine if patient may benefit from more calorie- and protein-dense oral nutrition supplement such as Ensure Enlive.  NUTRITION DIAGNOSIS:   Inadequate oral intake related to poor appetite as evidenced by per patient/family report, 7.4 percent weight loss over 1 month.  GOAL:   Patient will meet greater than or equal to 90% of their needs  MONITOR:   PO intake, Supplement acceptance, Labs, Weight trends, I & O's  REASON FOR ASSESSMENT:   Malnutrition Screening Tool    ASSESSMENT:   71 y.o. 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 Michela Pitcher for perforated diverticulitis 10/31. Patient presents with high output from the ileostomy and some drainage from the midline wound.   -Patient now s/p I&D of complex abdominal wall abscess, excisional debridement of skin today (1/12).   Spoke with patient at bedside. She reports her appetite was good yesterday and she believes it will remain good today. Her son is bringing her lunch and she plans on ordering dinner tonight. However, patient reports appetite had been poor since her surgery at end of October. She has only been eating 2 "small" meals per day per patient.   Patient reports she is not the one that takes care of her ileostomy, but reports her son was emptying the bag about 2 times a day recently. No output recorded yet this admission.  Patient reports UBW 208 lbs. Per chart patient was 187 lbs prior to operation. She lost 14 lbs (7.4% body weight) over 1 month after surgery, which is  significant for time frame.   Medications reviewed and include: pantoprazole, vancomycin, LR @ 50 ml/hr.   Labs reviewed: Glucose 111, Anion gap 3, Magnesium 1.6.   Nutrition-Focused physical exam completed. Findings are no fat depletion, no muscle depletion, and no edema.   Without more details on patient's intake she does not meet criteria for malnutrition. She is at high risk for malnutrition in setting of significant weight loss over one month that patient has not yet regained.   Diet Order:  Diet regular Room service appropriate? Yes; Fluid consistency: Thin  Skin:  Reviewed, no issues  Last BM:  12/03/2016  Height:   Ht Readings from Last 1 Encounters:  12/03/16 5\' 7"  (1.702 m)    Weight:   Wt Readings from Last 1 Encounters:  12/03/16 178 lb (80.7 kg)    Ideal Body Weight:  61.4 kg  BMI:  Body mass index is 27.88 kg/m.  Estimated Nutritional Needs:   Kcal:  1645-1915 (MSJ x 1.2-1.4)  Protein:  95-113 grams (1.2-1.4 grams/kg)  Fluid:  >/= 2 L/day (25 ml/kg)  EDUCATION NEEDS:   No education needs identified at this time  01/31/17, MS, RD, LDN Pager: [phone] After Hours Pager: [phone]

## 2016-12-03 NOTE — Transfer of Care (Signed)
Immediate Anesthesia Transfer of Care Note  Patient: Ruth Gray  Procedure(s) Performed: Procedure(s): DEBRIDEMENT OF ABDOMINAL WALL ABSCESS (N/A)  Patient Location: PACU  Anesthesia Type:General  Level of Consciousness: awake  Airway & Oxygen Therapy: Patient Spontanous Breathing and Patient connected to face mask oxygen  Post-op Assessment: Report given to RN and Post -op Vital signs reviewed and stable  Post vital signs: Reviewed  Last Vitals:  Vitals:   12/03/16 1155 12/03/16 1156  BP: 106/69   Pulse:  (!) (P) 101  Resp: 16   Temp: 36.3 C (P) 36.3 C    Last Pain:  Vitals:   12/03/16 1012  TempSrc: Tympanic  PainSc:          Complications: No apparent anesthesia complications

## 2016-12-03 NOTE — Anesthesia Preprocedure Evaluation (Signed)
Anesthesia Evaluation  Patient identified by MRN, date of birth, ID band Patient awake    Reviewed: Allergy & Precautions, H&P , NPO status , Patient's Chart, lab work & pertinent test results  History of Anesthesia Complications Negative for: history of anesthetic complications  Airway Mallampati: III  TM Distance: <3 FB Neck ROM: limited    Dental no notable dental hx. (+) Poor Dentition, Missing, Chipped, Upper Dentures   Pulmonary neg shortness of breath, Current Smoker,    Pulmonary exam normal breath sounds clear to auscultation       Cardiovascular Exercise Tolerance: Good (-) angina+ CAD  (-) Past MI and (-) DOE Normal cardiovascular exam Rhythm:regular Rate:Normal     Neuro/Psych negative neurological ROS  negative psych ROS   GI/Hepatic negative GI ROS, Neg liver ROS, neg GERD  ,  Endo/Other  negative endocrine ROS  Renal/GU      Musculoskeletal  (+) Arthritis ,   Abdominal   Peds  Hematology negative hematology ROS (+)   Anesthesia Other Findings Past Medical History: No date: Collagen vascular disease (HCC) 08/2016: Perforation of colon (HCC) 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  Past Surgical History: No date: ABDOMINAL HYSTERECTOMY 09/21/2016: CENTRAL VENOUS CATHETER INSERTION     Comment: Procedure: INSERTION CENTRAL LINE ADULT;                Surgeon: Tiney Rouge III, MD;  Location: [facility]               ORS;  Service: General;; 09/21/2016: COLON RESECTION     Comment: Procedure: COLON RESECTION- Ascending and               Sigmoid;  Surgeon: Tiney Rouge III, MD;                Location: [facility] ORS;  Service: General;; 09/25/2016: COLOSTOMY N/A     Comment: Procedure: COLOSTOMY;  Surgeon: Tiney Rouge III,              MD;  Location: [facility] ORS;  Service: General;                Laterality: N/A; No date: FOOT SURGERY 09/21/2016: LAPAROTOMY N/A     Comment: Procedure: EXPLORATORY LAPAROTOMY;  Surgeon:           Tiney Rouge III, MD;  Location: [facility] ORS;                Service: General;  Laterality: N/A; 09/25/2016: LAPAROTOMY N/A     Comment: Procedure: EXPLORATORY LAPAROTOMY and right               colon resection;  Surgeon: Tiney Rouge III, MD;                Location: [facility] ORS;  Service: General;                Laterality: N/A; No date: REPLACEMENT TOTAL KNEE Left  BMI    Body Mass Index:  27.88 kg/m      Reproductive/Obstetrics negative OB ROS                             Anesthesia Physical Anesthesia Plan  ASA: III  Anesthesia Plan: General ETT   Post-op Pain Management:    Induction:   Airway Management Planned:   Additional Equipment:   Intra-op Plan:   Post-operative Plan:   Informed Consent: I have reviewed the patients History and Physical, chart,  labs and discussed the procedure including the risks, benefits and alternatives for the proposed anesthesia with the patient or authorized representative who has indicated his/her understanding and acceptance.   Dental Advisory Given  Plan Discussed with: Anesthesiologist, CRNA and Surgeon  Anesthesia Plan Comments:         Anesthesia Quick Evaluation

## 2016-12-03 NOTE — Clinical Social Work Note (Signed)
CSW consulted by nursing for potential rehab needs and Rn CM was consulted by nursing for potential home health needs. Please re-consult CSW if needed. York Spaniel MSW,LCSW [phone]

## 2016-12-03 NOTE — Anesthesia Procedure Notes (Signed)
Procedure Name: LMA Insertion Performed by: Gabriel Paulding Pre-anesthesia Checklist: Patient identified, Patient being monitored, Timeout performed, Emergency Drugs available and Suction available Patient Re-evaluated:Patient Re-evaluated prior to inductionOxygen Delivery Method: Circle system utilized Preoxygenation: Pre-oxygenation with 100% oxygen Intubation Type: IV induction Ventilation: Mask ventilation without difficulty LMA: LMA inserted LMA Size: 3.5 Tube type: Oral Number of attempts: 1 Placement Confirmation: positive ETCO2 and breath sounds checked- equal and bilateral Tube secured with: Tape Dental Injury: Teeth and Oropharynx as per pre-operative assessment        

## 2016-12-03 NOTE — Op Note (Signed)
PROCEDURES: 1/ I/D of complex abdominal wall abscess 2. Excisional Debridement of skin but this tissue and fascia of the abdominal wall measuring 33 cm  Pre-operative Diagnosis: Abdominal wall abscess  Post-operative Diagnosis: Same  Surgeon: Merri Ray Pabon   Anesthesia: General endotracheal anesthesia  ASA Class: 3   Surgeon: Sterling Big , MD FACS  Anesthesia: Gen. with endotracheal tube  Findings: Complex abdominal wall abscess with some devitalized midline fascia No evidence of evisceration  Estimated Blood Loss: 5cc         Drains: none         Specimens: none        Complications: None        Procedure Details  The patient was seen again in the Holding Room. The benefits, complications, treatment options, and expected outcomes were discussed with the patient. The risks of bleeding, infection, recurrence of symptoms, failure to resolve symptoms,  bowel injury, any of which could require further surgery were reviewed with the patient.   The patient was taken to Operating Room, identified as Ruth Gray and the procedure verified.  A Time Out was held and the above information confirmed.   Prior to the induction of general anesthesia, antibiotic prophylaxis was administered. VTE prophylaxis was in place. General endotracheal anesthesia was then administered and tolerated well. After the induction, the abdomen was prepped with Chloraprep and draped in the sterile fashion. The patient was positioned in the supine position.  Using a right angle were able to identify the tract from the midline there were 2 spots on the lower midline using electrocautery we were unable to remove the cavity and there was significant amount of possible we obtained cultures. He also noticed that there was some devitalized fascia and also some cotton PDS sutures. We lysed some loculations with electrocautery and the suction device. Using a curet and we'll see Metzenbaum scissors with perform an  excisional debridement of abdominal wall all the way to the fascia. There was no evidence of evisceration and no evidence of necrotizing infection. It was used to obtain good hemostasis. Wet-to-dry was placed and also all the 3 ostomies were isolated. We placed Mastisol and 3 separate colostomy wafers and backs to isolate the mucous fistulas and the end ileostomy away from the open wound.   Needle and laparotomy count were correct and there were no immediate occasions  Sterling Big, MD, FACS

## 2016-12-03 NOTE — Progress Notes (Signed)
Preoperative Review   Patient is met in the preoperative holding area. The history is reviewed in the chart and with the patient. I personally reviewed the options and rationale as well as the risks of this procedure that have been previously discussed with the patient. All questions asked by the patient and/or family were answered to their satisfaction.  Patient agrees to proceed with this procedure at this time.  Ruth Gray M.D. FACS   

## 2016-12-04 LAB — BASIC METABOLIC PANEL
ANION GAP: 5 (ref 5–15)
BUN: 10 mg/dL (ref 6–20)
CALCIUM: 7.9 mg/dL — AB (ref 8.9–10.3)
CHLORIDE: 105 mmol/L (ref 101–111)
CO2: 25 mmol/L (ref 22–32)
CREATININE: 0.82 mg/dL (ref 0.44–1.00)
GFR calc non Af Amer: >60 mL/min (ref >60)
Glucose, Bld: 108 mg/dL — ABNORMAL HIGH (ref 65–99)
Potassium: 4.1 mmol/L (ref 3.5–5.1)
Sodium: 135 mmol/L (ref 135–145)

## 2016-12-04 LAB — MAGNESIUM: MAGNESIUM: 2 mg/dL (ref 1.7–2.4)

## 2016-12-04 LAB — VANCOMYCIN, TROUGH: VANCOMYCIN TR: 27 ug/mL — AB (ref 15–20)

## 2016-12-04 MED ORDER — VANCOMYCIN HCL IN DEXTROSE 750-5 MG/150ML-% IV SOLN
750.0000 mg | INTRAVENOUS | Status: DC
  Administered 2016-12-05: 750 mg via INTRAVENOUS
  Filled 2016-12-04 (×2): qty 150

## 2016-12-04 NOTE — Progress Notes (Signed)
Pharmacy Antibiotic Note  Ruth Gray is a 71 y.o. female s/p recent colon resection admitted on 12/02/2016 with mid abdominal abscess at sight of surgical wound.  Pharmacy has been consulted for vancomycin and meropnem dosing.  Plan: Trough is elevated. Will extend interval to 750mg  IV Q18hr with next dosen on 1/14 at 0700.   Meropenem 1 g iv q 8 hours.   Height: 5\' 7"  (170.2 cm) Weight: 178 lb (80.7 kg) IBW/kg (Calculated) : 61.6  Temp (24hrs), Avg:97.9 F (36.6 C), Min:97.6 F (36.4 C), Max:98 F (36.7 C)   Recent Labs Lab 12/02/16 1315 12/03/16 0546 12/04/16 0659 12/04/16 2027  WBC 9.0 8.5  --   --   CREATININE 0.94 0.95 0.82  --   VANCOTROUGH  --   --   --  27*    Estimated Creatinine Clearance: 70.7 mL/min (by C-G formula based on SCr of 0.82 mg/dL).    Allergies  Allergen Reactions  . Ciprofloxacin Swelling  . Penicillins Swelling and Rash    Has patient had a PCN reaction causing immediate rash, facial/tongue/throat swelling, SOB or lightheadedness with hypotension: yes Has patient had a PCN reaction causing severe rash involving mucus membranes or skin necrosis: no Has patient had a PCN reaction that required hospitalization no Has patient had a PCN reaction occurring within the last 10 years: no If all of the above answers are "NO", then may proceed with Cephalosporin use.   . Tramadol Nausea And Vomiting    Antimicrobials this admission: Vancomycin 1/11 >> Meropenem 1/11 >>  Dose adjustments this admission: 1/13 Vancomycin transitioned to 750mg  IV Q18hr.   Microbiology results:  Thank you for allowing pharmacy to be a part of this patient's care.  Simpson,Michael L 12/04/2016 9:00 PM

## 2016-12-04 NOTE — Progress Notes (Signed)
12/04/2016  Subjective: Patient is 1 Day Post-Op s/p I&D of complex abdominal wall abscess.  No acute events overnight.  Patient in good spirits and reports decreased pain.  Vital signs: Temp:  [97.3 F (36.3 C)-98.5 F (36.9 C)] 98 F (36.7 C) (01/13 0848) Pulse Rate:  [75-103] 81 (01/13 0848) Resp:  [13-24] 16 (01/13 0848) BP: (81-107)/(42-71) 91/51 (01/13 0848) SpO2:  [95 %-100 %] 99 % (01/13 0848) Weight:  [80.7 kg (178 lb)] 80.7 kg (178 lb) (01/12 1012)   Intake/Output: 01/12 0701 - 01/13 0700 In: 1447 [P.O.:25; I.V.:1182; IV Piggyback:240] Out: -  Last BM Date: 12/03/16  Physical Exam: Constitutional: No acute distress Abdomen:  Midline incision open with subcutaneous tissue exposed.  Healthy tissue, no further purulence or infection.  Dry gauze applied for packing, covered with abd/tape.  Ileostomy with stool in bag and viable mucosa.  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  Labs:   Recent Labs  12/02/16 1315 12/03/16 0546  WBC 9.0 8.5  HGB 9.8* 8.1*  HCT 30.5* 24.6*  PLT 476* 310    Recent Labs  12/03/16 0546 12/04/16 0659  NA 136 135  K 3.9 4.1  CL 107 105  CO2 26 25  GLUCOSE 111* 108*  BUN 11 10  CREATININE 0.95 0.82  CALCIUM 7.9* 7.9*    Recent Labs  12/02/16 1315  LABPROT 18.0*  INR 1.47    Imaging: No results found.  Assessment/Plan: 71 yo female s/p I&D of abdominal wall abscess.  --Continue regular diet today --Dressing changes twice daily. --Continue IV abx today.  Likely change to po tomorrow.  Waiting for cultures from [place]. --Possible discharge to home in 1-2 days.   Howie Ill, MD [facility]

## 2016-12-04 NOTE — Evaluation (Signed)
Physical Therapy Evaluation Patient Details Name: Ruth Gray MRN: [id number] DOB: 23-Mar-1946 Today's Date: 12/04/2016   History of Present Illness  presented to ER secondary to increased ostomy output, drainage from abdominal incision site; admitted wthi abdominal wall abscess.  Status post I & D 1/12 for drainage/clean out.  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  Clinical Impression  Upon evaluation, patient alert and oriented; follows all commands and demonstrates good insight/safety awareness.  Able to complete bed mobility indep; sit/stand, basic transfers and gait (>250') with RW, sup/mod indep.  Good LE strength/power with functional activities; fair cadence/gait speed.  No overt buckling, LOB or safety concern noted.  Mild SOB with exertion; HR elevation to 130s with gait (requiring seated rest break for recovery). Will plan to trial stairs next session to assess ability to progress towards bedroom in home environment. Would benefit from skilled PT to address above deficits and promote optimal return to PLOF; recommend transition to outpatient PT upon discharge from acute hospitalization.    Follow Up Recommendations Outpatient PT    Equipment Recommendations       Recommendations for Other Services       Precautions / Restrictions Precautions Precautions: Fall Precaution Comments: abdominal incision, ostomy Restrictions Weight Bearing Restrictions: No      Mobility  Bed Mobility Overal bed mobility: Independent                Transfers Overall transfer level: Modified independent Equipment used: Rolling walker (2 wheeled)             General transfer comment: sit/stand with RW, minimal use of UEs to assist; good LE strength/power  Ambulation/Gait Ambulation/Gait assistance: Supervision Ambulation Distance (Feet):  300 Feet Assistive device: Rolling walker (2 wheeled)   Gait velocity: 10' walk time, 6-7 seconds   General Gait Details: reciprocal stepping pattern with good step height/length, good cadence/gait speed.  No buckling, LOB or significant safety concern noted.  Stairs            Wheelchair Mobility    Modified Rankin (Stroke Patients Only)       Balance Overall balance assessment: Needs assistance Sitting-balance support: No upper extremity supported;Feet supported Sitting balance-Leahy Scale: Normal     Standing balance support: Bilateral upper extremity supported Standing balance-Leahy Scale: Good Standing balance comment: standing functional reach at least 6", patient using contralateral UE support for external stabilization as needed (good insight into limits of stability)                             Pertinent Vitals/Pain Pain Assessment: No/denies pain    Home Living Family/patient expects to be discharged to:: Private residence Living Arrangements: Children Available Help at Discharge: Family;Available 24 hours/day Type of Home: House Home Access: Stairs to enter Entrance Stairs-Rails: Left Entrance Stairs-Number of Steps: 4 Home Layout: Multi-level (split level) Home Equipment: Walker - 2 wheels;Cane - single point;Hospital bed Additional Comments: Has been living in lower leve of home with hospital bed and 1/2 bath since discharge from STR end of December, 2017    Prior Function           Comments: At baseline (prior to surgery on 10/31), indep with out assist device; since return home from STR, mod indep with RW for basic household mobility.  Reports persistent difficulty with stairs  Hand Dominance        Extremity/Trunk Assessment   Upper Extremity Assessment Upper Extremity Assessment: Overall WFL for tasks assessed    Lower Extremity Assessment Lower Extremity Assessment: Overall WFL for tasks assessed        Communication   Communication: No difficulties  Cognition Arousal/Alertness: Awake/alert Behavior During Therapy: WFL for tasks assessed/performed Overall Cognitive Status: Within Functional Limits for tasks assessed                      General Comments      Exercises     Assessment/Plan    PT Assessment Patient needs continued PT services  PT Problem List Decreased strength;Decreased activity tolerance;Decreased balance;Decreased mobility;Decreased skin integrity          PT Treatment Interventions Gait training;Stair training;Functional mobility training;Therapeutic activities;DME instruction;Therapeutic exercise;Balance training;Patient/family education    PT Goals (Current goals can be found in the Care Plan section)  Acute Rehab PT Goals Patient Stated Goal: to be able to get to my bed upstairs PT Goal Formulation: With patient Time For Goal Achievement: 12/18/16 Potential to Achieve Goals: Good    Frequency Min 2X/week   Barriers to discharge        Co-evaluation               End of Session Equipment Utilized During Treatment: Gait belt Activity Tolerance: Patient tolerated treatment well Patient left: in chair;with call bell/phone within reach (pad in place, box not available; RN informed/aware and to place box as available) Nurse Communication: Mobility status         Time: 5188-4166 PT Time Calculation (min) (ACUTE ONLY): 13 min   Charges:   PT Evaluation $PT Eval Low Complexity: 1 Procedure     PT G Codes:        Vidur Knust H. Manson Passey, PT, DPT, NCS 12/04/16, 12:13 PM [phone]

## 2016-12-05 MED ORDER — SULFAMETHOXAZOLE-TRIMETHOPRIM 800-160 MG PO TABS
1.0000 | ORAL_TABLET | Freq: Two times a day (BID) | ORAL | Status: DC
  Administered 2016-12-05 – 2016-12-06 (×3): 1 via ORAL
  Filled 2016-12-05 (×3): qty 1

## 2016-12-05 MED ORDER — VANCOMYCIN HCL IN DEXTROSE 750-5 MG/150ML-% IV SOLN
750.0000 mg | INTRAVENOUS | Status: DC
  Administered 2016-12-05: 750 mg via INTRAVENOUS
  Filled 2016-12-05 (×2): qty 150

## 2016-12-05 MED ORDER — HYDROCODONE-ACETAMINOPHEN 5-325 MG PO TABS
1.0000 | ORAL_TABLET | ORAL | Status: DC | PRN
  Administered 2016-12-05: 1 via ORAL
  Filled 2016-12-05: qty 1

## 2016-12-05 NOTE — Progress Notes (Signed)
Patient dressing changed twice this shift. Lots of stool out of colostomy bag and bag leaking at the site.

## 2016-12-05 NOTE — Progress Notes (Signed)
POD # 2 s/p I/D abd wall abscess Doing much better Cultures gram + and gram Neg pending biogram AVSS  PE NAD Abd: ostomies viable and patent. Ileostomy working well, midline wound healing well. No peritonitis  A/p Doing well Transition to Bactrim ( allergic to PCN and cipro) We will follow biogram and make sure bactrim will cover any potential Gram + mobilize

## 2016-12-05 NOTE — Progress Notes (Signed)
Pt. Moved from room 228 to 230 in order to accommodate a surgical Pt that needs to be on airborne precautions.

## 2016-12-06 MED ORDER — SULFAMETHOXAZOLE-TRIMETHOPRIM 800-160 MG PO TABS: 1.0000 | ORAL_TABLET | Freq: Two times a day (BID) | ORAL | 0 refills | Status: DC

## 2016-12-06 MED ORDER — HYDROCODONE-ACETAMINOPHEN 5-325 MG PO TABS: 1.0000 | ORAL_TABLET | ORAL | 0 refills | Status: DC | PRN

## 2016-12-06 MED ORDER — PANTOPRAZOLE SODIUM 40 MG PO TBEC: 40.0000 mg | DELAYED_RELEASE_TABLET | Freq: Every day | ORAL | Status: DC

## 2016-12-06 NOTE — Progress Notes (Signed)
Subjective:   She is feeling much better today. She does not have any significant complaints. She's remained afebrile. She's tolerating a regular diet with carbohydrate modification. Her ostomy is functioning well. She does not have any wound problems other than her abscess drainage. She is being evaluated by wound care today.  Vital signs in last 24 hours: Temp:  [98 F (36.7 C)-98.9 F (37.2 C)] 98.4 F (36.9 C) (01/15 0833) Pulse Rate:  [88-100] 99 (01/15 0833) Resp:  [17-19] 18 (01/15 0833) BP: (92-117)/(56-62) 117/60 (01/15 0833) SpO2:  [95 %-100 %] 100 % (01/15 0833) Last BM Date: 12/05/16  Intake/Output from previous day: 01/14 0701 - 01/15 0700 In: 1814 [P.O.:662; I.V.:1002; IV Piggyback:150] Out: 550 [Urine:450; Stool:100]  Exam:  Her abdomen looks good. Her ostomies are clean with healthy and functioning.  Lab Results:  CBC No results for input(s): WBC, HGB, HCT, PLT in the last 72 hours. CMP     Component Value Date/Time   NA 135 12/04/2016 0659   K 4.1 12/04/2016 0659   CL 105 12/04/2016 0659   CO2 25 12/04/2016 0659   GLUCOSE 108 (H) 12/04/2016 0659   BUN 10 12/04/2016 0659   CREATININE 0.82 12/04/2016 0659   CALCIUM 7.9 (L) 12/04/2016 0659   PROT 7.9 12/02/2016 1315   ALBUMIN 2.6 (L) 12/02/2016 1315   AST 30 12/02/2016 1315   ALT 10 (L) 12/02/2016 1315   ALKPHOS 74 12/02/2016 1315   BILITOT 0.9 12/02/2016 1315   GFRNONAA >60 12/04/2016 0659   GFRAA >60 12/04/2016 0659   PT/INR No results for input(s): LABPROT, INR in the last 72 hours.  Studies/Results: No results found.  Assessment/Plan: We will await wound care recommendations and tentatively plan discharge later today. She's doing remarkably well. I discussed this plan with her in detail and we will anticipate home health involvement in her dressing changes.

## 2016-12-06 NOTE — Progress Notes (Signed)
Alert and oriented. Vital signs stable . No signs of acute distress. Discharge instructions given. Patient verbalized understanding. No other issues noted at this time.    

## 2016-12-06 NOTE — Discharge Summary (Signed)
Patient ID: Ruth Gray MRN: [id number] DOB/AGE: January 18, 1946 71 y.o.  Admit date: 12/02/2016 Discharge date: 12/06/2016  Discharge Diagnoses:  Wound infection  Procedures Performed: Incision and drainage abdominal wound  Discharged Condition: fair  Hospital Course: She was admitted with wound drainage and obvious wound infection. She has a complicated previous course with a complex bowel resection. She been doing well recently but developed some significant symptoms requiring further evaluation. On January 12 she was taken to surgery where she underwent a incision and drainage procedure with evaluation service fashion. No dehiscence or evisceration was noted. She's continued to improve over the last several days. Her wound looks good and she's followed by the wound care center. We will plan her discharge today with follow-up in our office next week. She is in agreement with this plan.  Discharge Orders: Discharge Instructions    Diet - low sodium heart healthy    Complete by:  As directed       Disposition: 01-Home or Self Care  Discharge Medications:  Current Facility-Administered Medications:  .  apixaban (ELIQUIS) tablet 5 mg, 5 mg, Oral, BID, Diego F Pabon, MD, 5 mg at 12/06/16 1058 .  diphenhydrAMINE (BENADRYL) 12.5 MG/5ML elixir 12.5 mg, 12.5 mg, Oral, Q6H PRN, 12.5 mg at 12/04/16 2305 **OR** diphenhydrAMINE (BENADRYL) injection 12.5 mg, 12.5 mg, Intravenous, Q6H PRN, Ricarda Frame, MD .  feeding supplement (BOOST / RESOURCE BREEZE) liquid 1 Container, 1 Container, Oral, TID BM, Leafy Ro, MD, 1 Container at 12/06/16 1000 .  hydrALAZINE (APRESOLINE) injection 10 mg, 10 mg, Intravenous, Q2H PRN, Ricarda Frame, MD .  HYDROcodone-acetaminophen (NORCO/VICODIN) 5-325 MG per tablet 1-2 tablet, 1-2 tablet, Oral, Q4H PRN, Ricarda Frame, MD, 1 tablet at 12/05/16 2146 .  morphine 4 MG/ML injection 4 mg, 4 mg, Intravenous, Q4H PRN, Ricarda Frame, MD .  ondansetron  (ZOFRAN-ODT) disintegrating tablet 4 mg, 4 mg, Oral, Q6H PRN **OR** ondansetron (ZOFRAN) injection 4 mg, 4 mg, Intravenous, Q6H PRN, Ricarda Frame, MD .  pantoprazole (PROTONIX) EC tablet 40 mg, 40 mg, Oral, QHS, Ricarda Frame, MD .  sulfamethoxazole-trimethoprim (BACTRIM DS,SEPTRA DS) 800-160 MG per tablet 1 tablet, 1 tablet, Oral, Q12H, Leafy Ro, MD, 1 tablet at 12/06/16 1058  Follwup: Follow-up Information    [facility] Follow up in 1 week(s).   Contact information: [address] [place] [address] [phone]          Signed: Tiney Rouge III 12/06/2016, 12:23 PM

## 2016-12-06 NOTE — Consult Note (Addendum)
WOC Nurse ostomy consult note Stoma type/location: LLQ mucus fistula LUQ mucus fistula RLQ end Ileostomy Patient and son manage her ostomy and wound care. Is unsure of supplies at home. Given 4 pouches, barriers and barrier rings Stomal assessment/size: not assessed.  Pouch in place.  Peristomal assessment: not assessed Treatment options for stomal/peristomal skin: barrier ring Output mucus and stool Ostomy pouching: 2pc. 2 1/4" pouches used.  Education provided: None Enrolled patient in [employer] DC program: Already enrolled Will not follow at this time.  Please re-consult if needed.  Maple Hudson RN BSN CWON Pager [phone]

## 2016-12-06 NOTE — Progress Notes (Signed)
PHARMACIST - PHYSICIAN COMMUNICATION   CONCERNING: IV to Oral Route Change Policy  RECOMMENDATION: This patient is receiving pantoprazole by the intravenous route.  Based on criteria approved by the Pharmacy and Therapeutics Committee, the intravenous medication(s) is/are being converted to the equivalent oral dose form(s).   DESCRIPTION: These criteria include:  The patient is eating (either orally or via tube) and/or has been taking other orally administered medications for a least 24 hours  The patient has no evidence of active gastrointestinal bleeding or impaired GI absorption (gastrectomy, short bowel, patient on TNA or NPO).  If you have questions about this conversion, please contact the Pharmacy Department  []   [phone] )  ( [phone] [x]   [phone] )  [facility] []   [phone] )  [facility] []   [phone] )  [facility] []   [phone] )  [facility]   ( [phone], [facility] 12/06/2016 8:19 AM

## 2016-12-06 NOTE — Care Management Important Message (Signed)
Important Message  Patient Details  Name: Ruth Gray MRN: [id number] Date of Birth: 1946/04/02   Medicare Important Message Given:  Yes    Chapman Fitch, RN 12/06/2016, 12:36 PM

## 2016-12-06 NOTE — Anesthesia Postprocedure Evaluation (Signed)
Anesthesia Post Note  Patient: Ruth Gray  Procedure(s) Performed: Procedure(s) (LRB): DEBRIDEMENT OF ABDOMINAL WALL ABSCESS (N/A)  Patient location during evaluation: PACU Anesthesia Type: General Level of consciousness: awake and alert Pain management: pain level controlled Vital Signs Assessment: post-procedure vital signs reviewed and stable Respiratory status: spontaneous breathing, nonlabored ventilation, respiratory function stable and patient connected to nasal cannula oxygen Cardiovascular status: blood pressure returned to baseline and stable Postop Assessment: no signs of nausea or vomiting Anesthetic complications: no     Last Vitals:  Vitals:   12/06/16 0439 12/06/16 0833  BP: (!) 92/56 117/60  Pulse: 88 99  Resp: 17 18  Temp:  36.9 C    Last Pain:  Vitals:   12/06/16 0833  TempSrc: Oral  PainSc:                  [name]

## 2016-12-06 NOTE — Care Management (Signed)
Patient admitted post Incision and drainage abdominal wound.  Patient lives at home with son.  Son provides transportation.  Patient states that she was a walker, WC, cane, crutches, and hospital bed in the home.  PCP Clydie Braun.  Patient currently open with [facility] for Ot, PT, RN, and aide.  [place] with [facility] notified of admission and plan for discharge today.  Resumption orders have been placed.  Son to transport at discharge.  RNCM signing off.

## 2016-12-07 LAB — AEROBIC/ANAEROBIC CULTURE (SURGICAL/DEEP WOUND)

## 2016-12-07 LAB — AEROBIC/ANAEROBIC CULTURE W GRAM STAIN (SURGICAL/DEEP WOUND)

## 2016-12-08 ENCOUNTER — Telehealth: Payer: Self-pay

## 2016-12-08 NOTE — Telephone Encounter (Signed)
Patients son called to say the patients ostomy bag came loose and she has stool that seeped over onto the wound. He was instructed to clean the wound with soap and water as best as he can.  We discussed watching for signs and symptoms of infection, fever, redness, swelling and pain. He was instructed to call office if there are any concerns. I let him know the office is closed and if there are any problems to call the doctor on call or go to the emergency room. He verbalized understanding.

## 2016-12-10 ENCOUNTER — Other Ambulatory Visit: Payer: Self-pay

## 2016-12-13 ENCOUNTER — Ambulatory Visit (INDEPENDENT_AMBULATORY_CARE_PROVIDER_SITE_OTHER): Payer: Medicare Other | Admitting: Surgery

## 2016-12-13 ENCOUNTER — Encounter: Payer: Self-pay | Admitting: Surgery

## 2016-12-13 ENCOUNTER — Telehealth: Payer: Self-pay

## 2016-12-13 VITALS — BP 105/71 | HR 150 | Temp 97.9°F | Ht 67.0 in | Wt 158.4 lb

## 2016-12-13 DIAGNOSIS — L02211 Cutaneous abscess of abdominal wall: Secondary | ICD-10-CM

## 2016-12-13 NOTE — Patient Instructions (Signed)
You can try Stoma ahesive to see if this helps. Please continue wet to dry dressing changes daily. Please see your follow up appointment listed below. Please call our office if you have any questions or concerns.

## 2016-12-13 NOTE — Telephone Encounter (Signed)
Left Message at this time for [facility] to call office.

## 2016-12-13 NOTE — Progress Notes (Signed)
12/13/2016  HPI: 71 year old female status post incision and drainage of complex abdominal wall abscess on 1/12 with Dr. Everlene Farrier. She was discharged from the hospital 1/15 in good condition with gauze dressing changes. Patient is accompanied by her son. He reports that the wound has been doing well although she did have an episode late last week of spillage from the ostomy onto the wound. He was able to wash the wound well with soap and water. Otherwise denies any other issues at the moment. No fevers, no chills.  Vital signs: BP 105/71   Pulse (!) 150   Temp 97.9 F (36.6 C) (Oral)   Ht 5\' 7"  (1.702 m)   Wt 71.8 kg (158 lb 6.4 oz)   BMI 24.81 kg/m    Physical Exam: Constitutional: No acute distress Abdomen: Soft, nondistended, appropriately tender to palpation. Patient has an ileostomy in the right lower quadrant which is healthy viable. Patient has right upper and left upper quadrant mucous fistulas which are viable as well. There is no spillage of stool contents out of the ostomies. The midline incision is clean dry and intact with no evidence of infection. It is getting smaller and healing appropriately from the inside out. This healthy granulation tissue with no necrosis. Dry gauze dressing packing reapplied.  Assessment/Plan: 71 year old female status post incision and drainage of complex abdominal wall abscess.  -Patient can continue dry gauze dressing changes daily. -Given that there still having some spillage accidents from the ostomy appliance have recommended the patient use Stomahesive in order to create a better seal around the appliance. -Patient will follow-up next week for another wound check with Dr. 78.   Everlene Farrier, MD [facility]

## 2016-12-13 NOTE — Telephone Encounter (Signed)
Spoke with Mikel at this time. Ok to resume home health.

## 2016-12-13 NOTE — Telephone Encounter (Signed)
Otho Najjar is a Nurse, learning disability from [employer]. She is requesting a call back on the patient to okay her to resume nursing care. Please contact Mikel at [phone]

## 2016-12-14 ENCOUNTER — Telehealth: Payer: Self-pay

## 2016-12-14 NOTE — Telephone Encounter (Signed)
Annette Stable is a physical therapist calling to give a verbal order for the patient. He would like to see her twice a week for four weeks. The orders will be sent to [place] per Bill. Please call Bill to confirm the verbal order per his request.

## 2016-12-14 NOTE — Telephone Encounter (Signed)
Returned phone call to [place] at this time. No answer. Left voicemail with verbal orders on it at this time and asked for return phone call with any questions.

## 2016-12-16 ENCOUNTER — Telehealth: Payer: Self-pay

## 2016-12-16 NOTE — Telephone Encounter (Signed)
Stephanie at [facility] wants the patient  to do occupation therapy 1 time a week for 2 weeks, and then 2 times a week for 4 weeks. She is needing a verbal orders for this. Please call Judeth Cornfield at [phone] and advice.

## 2016-12-16 NOTE — Telephone Encounter (Signed)
Phone call made to [place] at this time. Left voicemail with verbal orders for the occupational therapy that she has requested for this patient. Fax number given so that written orders may be faxed over for physician signature.  Encouraged a return phone call if anything further is needed for patient.

## 2016-12-20 ENCOUNTER — Ambulatory Visit (INDEPENDENT_AMBULATORY_CARE_PROVIDER_SITE_OTHER): Payer: Medicare Other | Admitting: Surgery

## 2016-12-20 ENCOUNTER — Telehealth: Payer: Self-pay

## 2016-12-20 ENCOUNTER — Encounter: Payer: Self-pay | Admitting: Surgery

## 2016-12-20 VITALS — BP 123/71 | HR 82 | Temp 98.1°F | Resp 20 | Ht 67.0 in | Wt 158.4 lb

## 2016-12-20 DIAGNOSIS — Z09 Encounter for follow-up examination after completed treatment for conditions other than malignant neoplasm: Secondary | ICD-10-CM

## 2016-12-20 NOTE — Telephone Encounter (Signed)
Patient's son called in at this time and is extremely frustrated but states that he is out of 2 1/4" Convex wafers. He is placing last one on patient right now. He states that the order received last week was the incorrect product and will not fit bags that he has.  Call was made to [facility] (RN with [facility]) - [phone] at this time. No answer. Left voicemail asking for return phone call.  Call made to [facility] [phone]. Spoke with Tasia. She states that order for supplies is to be delivered tomorrow. But she is notifying the Home Health nurse Mikel and will have her get some supplies and set up a time to come by their house today to give supplies from office to hold them over until new supplies arrive.  I will notify patient of this at her appointment this morning.

## 2016-12-20 NOTE — Patient Instructions (Signed)
Please follow-up in 2 weeks as scheduled. Call our office with any questions or concerns prior to this appointment.

## 2016-12-20 NOTE — Progress Notes (Signed)
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 Michela Pitcher for perforated diverticulitis 10/31. S/P I/D abdominal wall abscess  Doing much better Eating and doing wet/dry to open midline wound Ostomy functioning AVSS  PE NAD Abd: soft, NT, midline wound healing well, good granulation tissue, Ostomies pink and patent. No peritonitis  A/p Doing well Continue wound care and nutritional support F/U 2 weeks per family request for another wound check No surgical intervention

## 2017-01-04 ENCOUNTER — Ambulatory Visit (INDEPENDENT_AMBULATORY_CARE_PROVIDER_SITE_OTHER): Payer: Medicare Other | Admitting: General Surgery

## 2017-01-04 ENCOUNTER — Encounter: Payer: Self-pay | Admitting: General Surgery

## 2017-01-04 VITALS — BP 107/70 | HR 90 | Temp 98.4°F | Ht 67.0 in | Wt 157.6 lb

## 2017-01-04 DIAGNOSIS — Z4889 Encounter for other specified surgical aftercare: Secondary | ICD-10-CM

## 2017-01-04 NOTE — Progress Notes (Signed)
Outpatient Surgical Follow Up  01/04/2017  Ruth Gray is an 71 y.o. female.   Chief Complaint  Patient presents with  . Routine Post Op    Laparotomy with Right Colon Resection (09/28/16)- Dr. Michela Pitcher and I&D of Abdominal Wall Abscess (12/03/16)- Dr. Everlene Farrier    HPI: 71 year old female returns to clinic for follow-up from multiple recent surgeries. Patient reports she continues to improve and has not required pain medication for approximately a week. She is eating very well and denies any nausea or vomiting. She denies any fevers, chills, chest pain, shortness of breath. 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 She continues to have swelling to her left lower extremity secondary to a DVT but states that 2 is improving. She also passed a little bit of blood with mucus per rectum recently. This is been since starting the anticoagulation for her DVT.  Past Medical History:  Diagnosis Date  . Collagen vascular disease (HCC)   . Perforation of colon (HCC) 08/2016  . Rheumatoid arteritis     Past Surgical History:  Procedure Laterality Date  . ABDOMINAL HYSTERECTOMY    . CENTRAL VENOUS CATHETER INSERTION  09/21/2016   Procedure: INSERTION CENTRAL LINE ADULT;  Surgeon: Tiney Rouge III, MD;  Location: [facility];  Service: General;;  . COLON RESECTION  09/21/2016   Procedure: COLON RESECTION- Ascending and Sigmoid;  Surgeon: Tiney Rouge III, MD;  Location: [facility];  Service: General;;  . COLOSTOMY N/A 09/25/2016   Procedure: COLOSTOMY;  Surgeon: Tiney Rouge III, MD;  Location: [facility];  Service: General;  Laterality: N/A;  . DEBRIDEMENT OF ABDOMINAL WALL ABSCESS N/A 12/03/2016   Procedure: DEBRIDEMENT OF ABDOMINAL WALL ABSCESS;  Surgeon: Leafy Ro, MD;  Location: [facility];  Service: General;  Laterality: N/A;  . FOOT SURGERY    . LAPAROTOMY N/A 09/21/2016   Procedure: EXPLORATORY LAPAROTOMY;  Surgeon: Tiney Rouge III, MD;  Location: [facility];   Service: General;  Laterality: N/A;  . LAPAROTOMY N/A 09/25/2016   Procedure: EXPLORATORY LAPAROTOMY and right colon resection;  Surgeon: Tiney Rouge III, MD;  Location: [facility];  Service: General;  Laterality: N/A;  . REPLACEMENT TOTAL KNEE Left     Family History  Problem Relation Age of Onset  . Breast cancer Other 36  . Prostate cancer Father   . Ovarian cancer Sister   . Kidney cancer Brother   . Lung cancer Sister     Social History:  reports that she has been smoking Cigarettes.  She has been smoking about 0.50 packs per day. She has never used smokeless tobacco. She reports that she does not drink alcohol or use drugs.  Allergies:  Allergies  Allergen Reactions  . Ciprofloxacin Swelling  . Penicillins Swelling and Rash    Has patient had a PCN reaction causing immediate rash, facial/tongue/throat swelling, SOB or lightheadedness with hypotension: yes Has patient had a PCN reaction causing severe rash involving mucus membranes or skin necrosis: no Has patient had a PCN reaction that required hospitalization no Has patient had a PCN reaction occurring within the last 10 years: no If all of the above answers are "NO", then may proceed with Cephalosporin use.   . Tramadol Nausea And Vomiting    Medications reviewed.    ROS A multipoint review of systems was completed. All pertinent positives and negatives are documented within the history of present illness and remainder are negative.   BP 107/70  Pulse 90   Temp 98.4 F (36.9 C) (Oral)   Ht 5\' 7"  (1.702 m)   Wt 71.5 kg (157 lb 9.6 oz)   BMI 24.68 kg/m   Physical Exam Gen.: No acute distress Neck: Supple nontender Chest: Clear to auscultation Heart: Regular rhythm Abdomen: Soft, minimally tender to palpation at her incision sites, nondistended. Midline incision with opening of the skin with healthy-appearing granulation tissue down to the fascia. No evidence of fascial defect. Right lower quadrant ileostomy  that is present and productive. 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 Extremities: Left lower extremity with possible edema but nontender calf.    No results found for this or any previous visit (from the past 48 hour(s)). No results found.  Assessment/Plan:  1. Aftercare following surgery 71 year old female with a complex surgical history. Had an I&D of her abdominal wound last month that is healing well. Patient is improving rapidly. Had a long conversation with the patient and her son about her possible future surgeries. However, counseled her that we would not even entertain the idea of performing surgery for many months. They voice understanding. Encouraged him to continue to encourage oral intake and physical therapy. They're to continue the wet-to-dry dressing changes to her abdominal wound as it is healing nicely. They will follow-up in clinic to see me in 5 weeks for additional wound check or sooner should there be any concerns of possible infection or other complication.     79, MD FACS General Surgeon  01/04/2017,9:43 AM

## 2017-01-04 NOTE — Patient Instructions (Signed)
We will see you back in 5 weeks. Please see appointment below.  Your job is to continue to eat as much protein and as many calories as you can to get your strength back up and to continue the healing process.   Continue with dressing changes twice daily with "damp" guaze and dry guaze on top.  Call with any questions or concerns.  High-Protein and High-Calorie Diet Introduction Eating high-protein and high-calorie foods can help you to gain weight, heal after an injury, and recover after an illness or surgery. What is my plan? The specific amount of daily protein and calories you need depends on:  Your body weight.  The reason this diet is recommended for you. Generally, a high-protein, high-calorie diet involves:  Eating 250-500 extra calories each day.  Making sure that 10-35% of your daily calories come from protein. Talk to your health care provider about how much protein and how many calories you need each day. Follow the diet as directed by your health care provider. What do I need to know about this diet?  Ask your health care provider if you should take a nutritional supplement.  Try to eat six small meals each day instead of three large meals.  Eat a balanced diet, including one food that is high in protein at each meal.  Keep nutritious snacks handy, such as nuts, trail mixes, dried fruit, and yogurt.  If you have kidney disease or diabetes, eating too much protein may put extra stress on your kidneys. Talk to your health care provider if you have either of those conditions. What are some high-protein foods? Grains  Quinoa. Bulgur wheat. Vegetables  Soybeans. Peas. Meats and Other Protein Sources  Beef, pork, and poultry. Fish and seafood. Eggs. Tofu. Textured vegetable protein (TVP). Peanut butter. Nuts and seeds. Dried beans. Protein powders. Dairy  Whole milk. Whole-milk yogurt. Powdered milk. Cheese. [employer]. Eggnog. Beverages  High-protein supplement  drinks. Soy milk. Other  Protein bars. The items listed above may not be a complete list of recommended foods or beverages. Contact your dietitian for more options.  What are some high-calorie foods? Grains  Pasta. Quick breads. Muffins. Pancakes. Ready-to-eat cereal. Vegetables  Vegetables cooked in oil or butter. Fried potatoes. Fruits  Dried fruit. Fruit leather. Canned fruit in syrup. Fruit juice. Avocados. Meats and Other Protein Sources  Peanut butter. Nuts and seeds. Dairy  Heavy cream. Whipped cream. Cream cheese. Sour cream. Ice cream. Custard. Pudding. Beverages  Meal-replacement beverages. Nutrition shakes. Fruit juice. Sugar-sweetened soft drinks. Condiments  Salad dressing. Mayonnaise. Alfredo sauce. Fruit preserves or jelly. Honey. Syrup. Sweets/Desserts  Cake. Cookies. Pie. Pastries. Candy bars. Chocolate. Fats and Oils  Butter or margarine. Oil. Gravy. Other  Meal-replacement bars. The items listed above may not be a complete list of recommended foods or beverages. Contact your dietitian for more options.  What are some tips for including high-protein and high-calorie foods in my diet?  Add whole milk, half-and-half, or heavy cream to cereal, pudding, soup, or hot cocoa.  Add whole milk to instant breakfast drinks.  Add peanut butter to oatmeal or smoothies.  Add powdered milk to baked goods, smoothies, or milkshakes.  Add powdered milk, cream, or butter to mashed potatoes.  Add cheese to cooked vegetables.  Make whole-milk yogurt parfaits. Top them with granola, fruit, or nuts.  Add cottage cheese to your fruit.  Add avocados, cheese, or both to sandwiches or salads.  Add meat, poultry, or seafood to rice, pasta, casseroles,  salads, and soups.  Use mayonnaise when making egg salad, chicken salad, or tuna salad.  Use peanut butter as a topping for pretzels, celery, or crackers.  Add beans to casseroles, dips, and spreads.  Add pureed beans to  sauces and soups.  Replace calorie-free drinks with calorie-containing drinks, such as milk and fruit juice. This information is not intended to replace advice given to you by your health care provider. Make sure you discuss any questions you have with your health care provider. Document Released: 11/08/2005 Document Revised: 04/15/2016 Document Reviewed: 04/23/2014  2017 Elsevier

## 2017-02-01 ENCOUNTER — Telehealth: Payer: Self-pay

## 2017-02-01 NOTE — Telephone Encounter (Signed)
Patient and patient's son, Ruth Gray called in stating that patient is having sharp pain lasting 1 -1.5 minutes up to 4 times each day to area of mucous Fistula. Denies any other abdominal pain, fever/chills, Nausea/vomiting. Colostomy is working well and mucous fistulas are continuing to produce mucous.  I explained to the patient and son that this is most likely due to nerve pain and we will assess this further at visit on Monday. She is encouraged to keep her appointment as scheduled on 02/07/17 and is encouraged to call with any worsening or changed symptoms. Both verbalize understanding of this.

## 2017-02-07 ENCOUNTER — Telehealth: Payer: Self-pay

## 2017-02-07 ENCOUNTER — Ambulatory Visit (INDEPENDENT_AMBULATORY_CARE_PROVIDER_SITE_OTHER): Payer: Medicare Other | Admitting: General Surgery

## 2017-02-07 ENCOUNTER — Encounter: Payer: Self-pay | Admitting: General Surgery

## 2017-02-07 VITALS — BP 110/73 | HR 79 | Temp 97.6°F | Ht 67.0 in | Wt 162.6 lb

## 2017-02-07 DIAGNOSIS — Z4889 Encounter for other specified surgical aftercare: Secondary | ICD-10-CM

## 2017-02-07 NOTE — Telephone Encounter (Signed)
Spoke with patient at this time to let her know that I reached out to  Amy @ [facility] in [place] and someone would be calling her. Patients contact information was provided. I will be faxing over last office visit note and order to [facility] tomorrow @ Fax # [phone] Patient verbalized understanding.

## 2017-02-07 NOTE — Patient Instructions (Signed)
I will call [facility] and let them know you will be moving to [place]. Someone from their office will contact you. Please see your follow up appointment listed below. Please continue the wet to dry dressing changes daily. Please call our office if you have questions or concerns.

## 2017-02-07 NOTE — Progress Notes (Signed)
Outpatient Surgical Follow Up  02/07/2017  Ruth Gray is an 71 y.o. female.   Chief Complaint  Patient presents with  . Routine Post Op    Laparotomy W/Right Colon Resection-I&D Abdominal Wall abscess 12/03/16-r.[place]    HPI: 71 year old female returns to clinic for follow up 2 months s/p incision and drainage of abdominal wall abscess. She has continued to recover and improve. Patient reports only requiring 2 pain pills over the last 5 weeks she was seen last. She is no longer requiring a wheelchair and is using a walker. Although she states she's about done with a walker. She has been performing daily wound care to her abdominal wound which has been healing nicely. She denies any fevers, chills, nausea, vomiting, chest pain, shortness of breath. She does have the occasional twinge of abdominal pain that shoots across her midabdomen at the site of her wound and ostomy. Otherwise she is without complaints. Her son is about to leave the area for admission trip to [place] and she'll be relocating to [place] to live with an additional son. She desires to continue to follow up with [place] for her wound checks. She is currently thinking about whether not to undergo surgery for ostomy reversal.  Past Medical History:  Diagnosis Date  . Collagen vascular disease (HCC)   . Perforation of colon (HCC) 08/2016  . Rheumatoid arteritis     Past Surgical History:  Procedure Laterality Date  . ABDOMINAL HYSTERECTOMY    . CENTRAL VENOUS CATHETER INSERTION  09/21/2016   Procedure: INSERTION CENTRAL LINE ADULT;  Surgeon: Tiney Rouge III, MD;  Location: [facility];  Service: General;;  . COLON RESECTION  09/21/2016   Procedure: COLON RESECTION- Ascending and Sigmoid;  Surgeon: Tiney Rouge III, MD;  Location: [facility];  Service: General;;  . COLOSTOMY N/A 09/25/2016   Procedure: COLOSTOMY;  Surgeon: Tiney Rouge III, MD;  Location: [facility];  Service: General;  Laterality: N/A;  . DEBRIDEMENT OF ABDOMINAL WALL  ABSCESS N/A 12/03/2016   Procedure: DEBRIDEMENT OF ABDOMINAL WALL ABSCESS;  Surgeon: Leafy Ro, MD;  Location: [facility];  Service: General;  Laterality: N/A;  . FOOT SURGERY    . LAPAROTOMY N/A 09/21/2016   Procedure: EXPLORATORY LAPAROTOMY;  Surgeon: Tiney Rouge III, MD;  Location: [facility];  Service: General;  Laterality: N/A;  . LAPAROTOMY N/A 09/25/2016   Procedure: EXPLORATORY LAPAROTOMY and right colon resection;  Surgeon: Tiney Rouge III, MD;  Location: [facility];  Service: General;  Laterality: N/A;  . REPLACEMENT TOTAL KNEE Left     Family History  Problem Relation Age of Onset  . Breast cancer Other 36  . Prostate cancer Father   . Ovarian cancer Sister   . Kidney cancer Brother   . Lung cancer Sister     Social History:  reports that she has been smoking Cigarettes.  She has been smoking about 0.50 packs per day. She has never used smokeless tobacco. She reports that she does not drink alcohol or use drugs.  Allergies:  Allergies  Allergen Reactions  . Ciprofloxacin Swelling  . Penicillins Swelling and Rash    Has patient had a PCN reaction causing immediate rash, facial/tongue/throat swelling, SOB or lightheadedness with hypotension: yes Has patient had a PCN reaction causing severe rash involving mucus membranes or skin necrosis: no Has patient had a PCN reaction that required hospitalization no Has patient had a PCN reaction occurring within the last 10 years: no If all of the  above answers are "NO", then may proceed with Cephalosporin use.   . Tramadol Nausea And Vomiting    Medications reviewed.    ROS A multipoint review of systems was completed, all pertinent positives and negatives are documented within the history of present illness the remainder are negative   BP 110/73   Pulse 79   Temp 97.6 F (36.4 C) (Oral)   Ht 5\' 7"  (1.702 m)   Wt 73.8 kg (162 lb 9.6 oz)   BMI 25.47 kg/m   Physical Exam Gen.: No acute distress Chest: Clear to  auscultation Heart: Regular rhythm Abdomen: Soft, nontender, nondistended. Into ileostomy present in the right lower quadrant that is functioning properly. 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 Midline wound almost completely healed. Approximately 2 x 2 centimeter area skin opening with healthy-appearing granulation tissue down to the fascia.    No results found for this or any previous visit (from the past 48 hour(s)). No results found.  Assessment/Plan:  1. Aftercare following surgery 71 year old female status post abdominal catastrophe and abdominal wall abscess drainage. Doing very well. Discussed continuing local wound care and continue to work with physical therapy. She'll return to clinic in 5 weeks for additional wound check. Again discussed that no additional surgeries would be discussed for planned until she is fully healed her abdominal wound. She voiced understanding. Follow-up in 5 weeks.     79, MD FACS General Surgeon  02/07/2017,9:13 AM

## 2017-02-08 NOTE — Telephone Encounter (Signed)
error 

## 2017-02-08 NOTE — Telephone Encounter (Signed)
Physician order written and will be in chart. Faxed with last office notes, demographics, and insurance card, and Op note.

## 2017-02-25 ENCOUNTER — Ambulatory Visit
Admission: RE | Admit: 2017-02-25 | Discharge: 2017-02-25 | Disposition: A | Discharge status: Home / Self Care | Payer: Medicare Other | Source: Ambulatory Visit | Attending: Infectious Diseases | Admitting: Infectious Diseases

## 2017-02-25 ENCOUNTER — Other Ambulatory Visit: Payer: Self-pay | Admitting: Infectious Diseases

## 2017-02-25 DIAGNOSIS — D709 Neutropenia, unspecified: Secondary | ICD-10-CM | POA: Insufficient documentation

## 2017-02-25 DIAGNOSIS — I82432 Acute embolism and thrombosis of left popliteal vein: Secondary | ICD-10-CM | POA: Diagnosis not present

## 2017-02-25 DIAGNOSIS — I824Z2 Acute embolism and thrombosis of unspecified deep veins of left distal lower extremity: Secondary | ICD-10-CM | POA: Diagnosis present

## 2017-02-25 DIAGNOSIS — I82442 Acute embolism and thrombosis of left tibial vein: Secondary | ICD-10-CM | POA: Insufficient documentation

## 2017-02-25 DIAGNOSIS — I82412 Acute embolism and thrombosis of left femoral vein: Secondary | ICD-10-CM | POA: Diagnosis not present

## 2017-03-07 ENCOUNTER — Other Ambulatory Visit: Payer: Self-pay

## 2017-03-15 ENCOUNTER — Other Ambulatory Visit: Payer: Self-pay

## 2017-03-15 ENCOUNTER — Ambulatory Visit (INDEPENDENT_AMBULATORY_CARE_PROVIDER_SITE_OTHER): Payer: Medicare Other | Admitting: General Surgery

## 2017-03-15 VITALS — Ht 67.0 in

## 2017-03-15 DIAGNOSIS — T8189XD Other complications of procedures, not elsewhere classified, subsequent encounter: Secondary | ICD-10-CM

## 2017-03-15 DIAGNOSIS — T8189XA Other complications of procedures, not elsewhere classified, initial encounter: Secondary | ICD-10-CM | POA: Insufficient documentation

## 2017-03-15 NOTE — Patient Instructions (Signed)
Continue you with you're dressing changes, starting dry dressings now.  We will see you back in office in 5 weeks, see your appointment below.

## 2017-03-15 NOTE — Progress Notes (Signed)
Outpatient Surgical Follow Up  03/15/2017  Ruth Gray is an 71 y.o. female.   No chief complaint on file.   HPI: 71 year old female who is well-known to the surgery service returns to clinic for additional wound follow-up. She had a competent hospital stay due to perforation of the sigmoid colon and the cecum from diverticulitis. This was then further located by a fascial wound infection requiring a take back to the operating room from the clinic. She has been performing home wound care. She notes she is eating well and having normal ostomy function. Her wound continues to very slowly heal. 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 Otherwise doing well and appears to be getting stronger.  Past Medical History:  Diagnosis Date  . Collagen vascular disease (HCC)   . Perforation of colon (HCC) 08/2016  . Rheumatoid arteritis     Past Surgical History:  Procedure Laterality Date  . ABDOMINAL HYSTERECTOMY    . CENTRAL VENOUS CATHETER INSERTION  09/21/2016   Procedure: INSERTION CENTRAL LINE ADULT;  Surgeon: Tiney Rouge III, MD;  Location: [facility];  Service: General;;  . COLON RESECTION  09/21/2016   Procedure: COLON RESECTION- Ascending and Sigmoid;  Surgeon: Tiney Rouge III, MD;  Location: [facility];  Service: General;;  . COLOSTOMY N/A 09/25/2016   Procedure: COLOSTOMY;  Surgeon: Tiney Rouge III, MD;  Location: [facility];  Service: General;  Laterality: N/A;  . DEBRIDEMENT OF ABDOMINAL WALL ABSCESS N/A 12/03/2016   Procedure: DEBRIDEMENT OF ABDOMINAL WALL ABSCESS;  Surgeon: Leafy Ro, MD;  Location: [facility];  Service: General;  Laterality: N/A;  . FOOT SURGERY    . LAPAROTOMY N/A 09/21/2016   Procedure: EXPLORATORY LAPAROTOMY;  Surgeon: Tiney Rouge III, MD;  Location: [facility];  Service: General;  Laterality: N/A;  . LAPAROTOMY N/A 09/25/2016   Procedure: EXPLORATORY LAPAROTOMY and right colon resection;  Surgeon: Tiney Rouge  III, MD;  Location: [facility];  Service: General;  Laterality: N/A;  . REPLACEMENT TOTAL KNEE Left     Family History  Problem Relation Age of Onset  . Breast cancer Other 36  . Prostate cancer Father   . Ovarian cancer Sister   . Kidney cancer Brother   . Lung cancer Sister     Social History:  reports that she has been smoking Cigarettes.  She has been smoking about 0.50 packs per day. She has never used smokeless tobacco. She reports that she does not drink alcohol or use drugs.  Allergies:  Allergies  Allergen Reactions  . Ciprofloxacin Swelling  . Penicillins Swelling and Rash    Has patient had a PCN reaction causing immediate rash, facial/tongue/throat swelling, SOB or lightheadedness with hypotension: yes Has patient had a PCN reaction causing severe rash involving mucus membranes or skin necrosis: no Has patient had a PCN reaction that required hospitalization no Has patient had a PCN reaction occurring within the last 10 years: no If all of the above answers are "NO", then may proceed with Cephalosporin use.   . Tramadol Nausea And Vomiting    Medications reviewed.    ROS A multipoint review of systems was completed, all pertinent positives and negatives are documented within the history of present illness the remainder were negative   Ht 5\' 7"  (1.702 m)   Physical Exam Gen.: No acute distress Chest: Clear to auscultation Heart: Regular rate and rhythm Abdomen: Soft, nontender, nondistended. Complex wounds to the midline  and entire abdomen. Right lower quadrant ileostomy is functioning well. Right upper quadrant and left upper quadrant mucous fistulas with mucous present on the bandage and healthy-appearing mucosa. Midline wound well approximated with the exception of a 3 x 4 cm area of opening that goes down to the level of the fascia. There is healthy-appearing granulation tissue with fibrinous exudate but no evidence of purulence.    No results found for  this or any previous visit (from the past 48 hour(s)). No results found.  Assessment/Plan:  1. Non-healing surgical wound, subsequent encounter 71 year old female with complex abdominal wound from an abdominal catastrophe. Healing very well. Her nutritional state and energy level is improving. Again had a long conversation with the patient that in order to reverse her would be a difficult surgery and would require hospitalization. Also discussed that to fully provide together would take multiple surgeries. She voiced understanding. At this point discussed that she needs to completely heal her midline prior to surgery even being entertained. She voiced understanding. Plan to change wound care from wet-to-dry dressings to dry to dry dressings to her midline. She is to follow-up in clinic in 5 weeks for additional wound check or sooner should there be any signs of infection.     Ruth Frame, MD FACS General Surgeon  03/15/2017,2:04 PM

## 2017-04-12 ENCOUNTER — Other Ambulatory Visit: Payer: Self-pay

## 2017-04-21 ENCOUNTER — Encounter: Payer: Self-pay | Admitting: General Surgery

## 2017-04-21 ENCOUNTER — Ambulatory Visit (INDEPENDENT_AMBULATORY_CARE_PROVIDER_SITE_OTHER): Payer: Medicare Other | Admitting: General Surgery

## 2017-04-21 VITALS — BP 109/67 | HR 84 | Temp 97.8°F | Wt 157.0 lb

## 2017-04-21 DIAGNOSIS — T8189XD Other complications of procedures, not elsewhere classified, subsequent encounter: Secondary | ICD-10-CM

## 2017-04-21 NOTE — Progress Notes (Signed)
Outpatient Surgical Follow Up  04/21/2017  Ruth Gray is an 71 y.o. female.   Chief Complaint  Patient presents with  . Routine Post Op    Laparotomy W/ Right Colon Resection    HPI: 71 year old female who is well-known to the surgery service returns to clinic for additional wound follow-up. She had a complicated hospital stay due to perforation of the sigmoid colon and the cecum from diverticulitis in October 2017. This was then further complicated by a fascial wound infection requiring a take back to the operating room from the clinic January 2018. She has been performing home wound care. She notes she is eating better and having good ostomy function. Her wound continues to very slowly heal, currently performing dry to dry dressing changes. 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 She reports that the leg swelling and pain has improved since starting on the anticoagulation. She denies any current fevers, chills, nausea, vomiting, chest pain, shortness breath. She is however having upper respiratory congestion with cough.  Past Medical History:  Diagnosis Date  . Collagen vascular disease (HCC)   . Perforation of colon (HCC) 08/2016  . Rheumatoid arteritis     Past Surgical History:  Procedure Laterality Date  . ABDOMINAL HYSTERECTOMY    . CENTRAL VENOUS CATHETER INSERTION  09/21/2016   Procedure: INSERTION CENTRAL LINE ADULT;  Surgeon: Tiney Rouge III, MD;  Location: [facility];  Service: General;;  . COLON RESECTION  09/21/2016   Procedure: COLON RESECTION- Ascending and Sigmoid;  Surgeon: Tiney Rouge III, MD;  Location: [facility];  Service: General;;  . COLOSTOMY N/A 09/25/2016   Procedure: COLOSTOMY;  Surgeon: Tiney Rouge III, MD;  Location: [facility];  Service: General;  Laterality: N/A;  . DEBRIDEMENT OF  ABDOMINAL WALL ABSCESS N/A 12/03/2016   Procedure: DEBRIDEMENT OF ABDOMINAL WALL ABSCESS;  Surgeon: Leafy Ro, MD;  Location: [facility];  Service: General;  Laterality: N/A;  . FOOT SURGERY    . LAPAROTOMY N/A 09/21/2016   Procedure: EXPLORATORY LAPAROTOMY;  Surgeon: Tiney Rouge III, MD;  Location: [facility];  Service: General;  Laterality: N/A;  . LAPAROTOMY N/A 09/25/2016   Procedure: EXPLORATORY LAPAROTOMY and right colon resection;  Surgeon: Tiney Rouge III, MD;  Location: [facility];  Service: General;  Laterality: N/A;  . REPLACEMENT TOTAL KNEE Left     Family History  Problem Relation Age of Onset  . Breast cancer Other 36  . Prostate cancer Father   . Ovarian cancer Sister   . Kidney cancer Brother   . Lung cancer Sister     Social History:  reports that she has been smoking Cigarettes.  She has been smoking about 0.50 packs per day. She has never used smokeless tobacco. She reports that she does not drink alcohol or use drugs.  Allergies:  Allergies  Allergen Reactions  . Ciprofloxacin Swelling  . Penicillins Swelling and Rash    Has patient had a PCN reaction causing immediate rash, facial/tongue/throat swelling, SOB or lightheadedness with hypotension: yes Has patient had a PCN reaction causing severe rash involving mucus membranes or skin necrosis: no Has patient had a PCN reaction that required hospitalization no Has patient had a PCN reaction occurring within the last 10 years: no If all of the above answers are "NO", then may proceed with Cephalosporin  use.   . Tramadol Nausea And Vomiting    Medications reviewed.    ROS A multipoint review of systems was completed, all pertinent positives and negatives are documented within the history of present illness and remainder are negative   BP 109/67   Pulse 84   Temp 97.8 F (36.6 C) (Oral)   Wt 71.2 kg (157 lb)   BMI 24.59 kg/m   Physical Exam Gen.: No acute distress Neck: Supple and nontender Chest: Coarse  breath sounds throughout but equal bilaterally Heart: Regular rate and rhythm Abdomen: Soft, nontender, nondistended midline wound present with the most caudad 2 cm opened down to the level of fascia. Healthy-appearing granulation tissue throughout. Minimally tender at the area the umbilicus but without signs of erythema or drainage. Functional left lower quadrant ileostomy with gas and stool within the bag. Mucous fistulas to the right and left upper quadrant with minimal mucus on the dressings. Questionable fascial defect in the left lower quadrant.    No results found for this or any previous visit (from the past 48 hour(s)). No results found.  Assessment/Plan:  1. Non-healing surgical wound, subsequent encounter 71 year old female with a complicated surgical past returns to clinic for wound check. Continues to improve. Discussed with the patient again the possibility of surgical reversal and she states today for the first time, that she wants to have the area fixed. With this in mind discussed with the patient that we would see her back again in 5 weeks for an additional wound check. At that time, assuming improvement, we will order a CT scan to better delineate her internal anatomy and abdominal wall. Surgical planning would occur after that. She understands it will be a difficult surgery and require prolonged hospital stay. She also understands that it will likely require multiple surgeries to fix it. Patient to return in 5 weeks for additional wound check.  A total of 15 minutes was use illicit encounter with greater than 50% of it used for counseling and coordination of care.     Ricarda Frame, MD FACS General Surgeon  04/21/2017,10:11 AM

## 2017-04-21 NOTE — Patient Instructions (Addendum)
Please continue to change your dressing as aften as possible if needed to.  We will see you back in 7 weeks to see how much more you have improved.

## 2017-06-13 ENCOUNTER — Other Ambulatory Visit: Payer: Self-pay

## 2017-06-17 ENCOUNTER — Ambulatory Visit (INDEPENDENT_AMBULATORY_CARE_PROVIDER_SITE_OTHER): Payer: Medicare Other | Admitting: General Surgery

## 2017-06-17 ENCOUNTER — Encounter: Payer: Self-pay | Admitting: General Surgery

## 2017-06-17 VITALS — BP 117/69 | HR 66 | Temp 97.7°F | Ht 67.0 in | Wt 161.6 lb

## 2017-06-17 DIAGNOSIS — Z932 Ileostomy status: Secondary | ICD-10-CM | POA: Diagnosis not present

## 2017-06-17 NOTE — Patient Instructions (Signed)
We will see you back in 1 month.  Please call with any questions or concerns prior to this appointment.

## 2017-06-17 NOTE — Progress Notes (Signed)
Outpatient Surgical Follow Up  06/17/2017  Ruth Gray is an 71 y.o. female.   Chief Complaint  Patient presents with  . Follow-up    abdominal wound care    HPI: Patient well-known to the Department returns for continued follow-up of her multiple abdominal wounds. She has a well-functioning right lower quadrant ileostomy. She has a right upper quadrant and left upper quadrant mucous fistula that appear healthy and are functioning normally. She has a slowly healing periumbilical wound that is residual from her infected midline. It has continued to decrease in size. Patient has been regaining weight, energy, independent function. She reports she is doing very well. She denies any active complaints today. She denies any fevers, chills, nausea, vomiting, chest pain, shortness of breath, diarrhea, constipation. She has been performing dry dressing changes. She does report occasional bleeding from the right upper quadrant mucous fistula site. This is related to irritation from the colostomy appliance if not applied correctly.  Past Medical History:  Diagnosis Date  . Collagen vascular disease (HCC)   . Perforation of colon (HCC) 08/2016  . Rheumatoid arteritis     Past Surgical History:  Procedure Laterality Date  . ABDOMINAL HYSTERECTOMY    . CENTRAL VENOUS CATHETER INSERTION  09/21/2016   Procedure: INSERTION CENTRAL LINE ADULT;  Surgeon: Tiney Rouge III, MD;  Location: [facility];  Service: General;;  . COLON RESECTION  09/21/2016   Procedure: COLON RESECTION- Ascending and Sigmoid;  Surgeon: Tiney Rouge III, MD;  Location: [facility];  Service: General;;  . COLOSTOMY N/A 09/25/2016   Procedure: COLOSTOMY;  Surgeon: Tiney Rouge III, MD;  Location: [facility];  Service: General;  Laterality: N/A;  . DEBRIDEMENT OF ABDOMINAL WALL ABSCESS N/A 12/03/2016   Procedure: DEBRIDEMENT OF ABDOMINAL WALL ABSCESS;  Surgeon: Leafy Ro, MD;  Location: [facility];  Service: General;  Laterality: N/A;  . FOOT  SURGERY    . LAPAROTOMY N/A 09/21/2016   Procedure: EXPLORATORY LAPAROTOMY;  Surgeon: Tiney Rouge III, MD;  Location: [facility];  Service: General;  Laterality: N/A;  . LAPAROTOMY N/A 09/25/2016   Procedure: EXPLORATORY LAPAROTOMY and right colon resection;  Surgeon: Tiney Rouge III, MD;  Location: [facility];  Service: General;  Laterality: N/A;  . REPLACEMENT TOTAL KNEE Left     Family History  Problem Relation Age of Onset  . Breast cancer Other 36  . Prostate cancer Father   . Ovarian cancer Sister   . Kidney cancer Brother   . Lung cancer Sister     Social History:  reports that she has been smoking Cigarettes.  She has been smoking about 0.50 packs per day. She has never used smokeless tobacco. She reports that she does not drink alcohol or use drugs.  Allergies:  Allergies  Allergen Reactions  . Ciprofloxacin Swelling  . Penicillins Swelling and Rash    Has patient had a PCN reaction causing immediate rash, facial/tongue/throat swelling, SOB or lightheadedness with hypotension: yes Has patient had a PCN reaction causing severe rash involving mucus membranes or skin necrosis: no Has patient had a PCN reaction that required hospitalization no Has patient had a PCN reaction occurring within the last 10 years: no If all of the above answers are "NO", then may proceed with Cephalosporin use.   . Tramadol Nausea And Vomiting    Medications reviewed.    ROS A multipoint review of systems was completed, all pertinent positives and negatives are documented within the history of present illness and the  remainder are negative.   BP 117/69   Pulse 66   Temp 97.7 F (36.5 C) (Oral)   Ht 5\' 7"  (1.702 m)   Wt 73.3 kg (161 lb 9.6 oz)   BMI 25.31 kg/m   Physical Exam Gen.: No acute distress Neck: Supple and nontender Chest: Clear to auscultation Heart: Regular rate and rhythm Abdomen: Soft, nontender, nondistended. Healthy appearing mucosa to bilateral upper quadrant mucous  fistulas and right lower quadrant ileostomy. Midline incision fully healed with the exception of an infraumbilical 1 x 2 x 2 cm opening of healthy-appearing granulation tissue.    No results found for this or any previous visit (from the past 48 hour(s)). No results found.  Assessment/Plan:  1. Ileostomy in place [facility]) 71 year old female with an end ileostomy and multiple mucous fistulas as in the result of an abdominal catastrophe. Doing very well. Midline wound is almost completely healed. Again discussed with the patient the anticipated surgery required to reverse her problems. Again reiterated that her midline wound needs to be fully healed before we plan to do an additional surgery. She voiced understanding. Discussed that during which she is healing it is possible that she'll be ready for surgery by October. She'll follow up with clinic in 1-2 months for another abdominal exam to determine whether not she is ready scheduled for surgery. All questions answered to the patient's satisfaction.  A total of 15 minutes was used to listen count of greater than 50% of it used for counseling her coordination of care.   November, MD FACS General Surgeon  06/17/2017,12:41 PM

## 2017-07-20 ENCOUNTER — Encounter: Payer: Self-pay | Admitting: General Surgery

## 2017-07-20 ENCOUNTER — Ambulatory Visit (INDEPENDENT_AMBULATORY_CARE_PROVIDER_SITE_OTHER): Payer: Medicare Other | Admitting: General Surgery

## 2017-07-20 VITALS — BP 126/79 | HR 69 | Temp 97.7°F | Wt 159.0 lb

## 2017-07-20 DIAGNOSIS — Z932 Ileostomy status: Secondary | ICD-10-CM

## 2017-07-20 NOTE — Progress Notes (Signed)
Outpatient Surgical Follow Up  07/20/2017  Ruth Gray is an 71 y.o. female.   Chief Complaint  Patient presents with  . Follow-up    Abdominal Exam and Discuss Ileostomy Reversal    HPI: 71 year old female who is well-known the surgery department returns to clinic for evaluation of her midline wound and to discuss her ostomy reversal surgery. Patient reports doing very well. She denies any pain, fevers, chills, nausea, vomiting, chest pain, shortness of breath. She has been increasing her activity and doing very well. Her only complaint is of occasional blood on her dressings when she goes to change the packing. Ostomy is functioning well but she does have some irritation from the ostomy appliance.  Past Medical History:  Diagnosis Date  . Collagen vascular disease (HCC)   . Perforation of colon (HCC) 08/2016  . Rheumatoid arteritis     Past Surgical History:  Procedure Laterality Date  . ABDOMINAL HYSTERECTOMY    . CENTRAL VENOUS CATHETER INSERTION  09/21/2016   Procedure: INSERTION CENTRAL LINE ADULT;  Surgeon: Tiney Rouge III, MD;  Location: [facility];  Service: General;;  . COLON RESECTION  09/21/2016   Procedure: COLON RESECTION- Ascending and Sigmoid;  Surgeon: Tiney Rouge III, MD;  Location: [facility];  Service: General;;  . COLOSTOMY N/A 09/25/2016   Procedure: COLOSTOMY;  Surgeon: Tiney Rouge III, MD;  Location: [facility];  Service: General;  Laterality: N/A;  . DEBRIDEMENT OF ABDOMINAL WALL ABSCESS N/A 12/03/2016   Procedure: DEBRIDEMENT OF ABDOMINAL WALL ABSCESS;  Surgeon: Leafy Ro, MD;  Location: [facility];  Service: General;  Laterality: N/A;  . FOOT SURGERY    . LAPAROTOMY N/A 09/21/2016   Procedure: EXPLORATORY LAPAROTOMY;  Surgeon: Tiney Rouge III, MD;  Location: [facility];  Service: General;  Laterality: N/A;  . LAPAROTOMY N/A 09/25/2016   Procedure: EXPLORATORY LAPAROTOMY and right colon resection;  Surgeon: Tiney Rouge III, MD;  Location: [facility];  Service: General;   Laterality: N/A;  . REPLACEMENT TOTAL KNEE Left     Family History  Problem Relation Age of Onset  . Breast cancer Other 36  . Prostate cancer Father   . Ovarian cancer Sister   . Kidney cancer Brother   . Lung cancer Sister     Social History:  reports that she has been smoking Cigarettes.  She has been smoking about 0.50 packs per day. She has never used smokeless tobacco. She reports that she does not drink alcohol or use drugs.  Allergies:  Allergies  Allergen Reactions  . Ciprofloxacin Swelling  . Penicillins Swelling and Rash    Has patient had a PCN reaction causing immediate rash, facial/tongue/throat swelling, SOB or lightheadedness with hypotension: yes Has patient had a PCN reaction causing severe rash involving mucus membranes or skin necrosis: no Has patient had a PCN reaction that required hospitalization no Has patient had a PCN reaction occurring within the last 10 years: no If all of the above answers are "NO", then may proceed with Cephalosporin use.   . Tramadol Nausea And Vomiting    Medications reviewed.    ROS A multipoint review of systems was completed, all pertinent positives and negatives are documented within the history of present illness and remainder are negative   BP 126/79   Pulse 69   Temp 97.7 F (36.5 C) (Oral)   Wt 72.1 kg (159 lb)   BMI 24.90 kg/m   Physical Exam Gen.: No acute distress Neck: Supple and nontender Chest: Clear  to auscultation Heart: Regular rhythm Abdomen: Soft, nontender, nondistended. Right and left upper quadrant mucous fistulas with visible mucosa but no evidence of infection. Right lower quadrant end ileostomy is pink, patent, productive of stool. Midline wound almost completely healed. There is a 1.5 x 1 x 1 cm opening at the umbilicus with healthy-appearing granulation tissue and no evidence of infection.    No results found for this or any previous visit (from the past 48 hour(s)). No results  found.  Assessment/Plan:  1. Ileostomy in place [facility]) 71 year old female with a complicated surgical past and a functioning end ileostomy. Doing very well. Discussed changes and wound care. She is now ready to discuss her ostomy reversal surgeries. Discussed the operative plan that would include reconnecting her: And her small bowel, and to protect the anastomoses with a loop ileostomy proximal this. She understands that we'll be a complicated open surgery and require an additional trip to the operating room approximately 8 weeks later to reverse her loop ileostomy once we knew her anastomoses were healed. Her son, who is her primary transportation and caregiver, requests planning for surgery the first week in October. I voiced that she will need to return to clinic in 1 month for an additional wound check but we will tentatively put her on the schedule to be performed with myself and my partner Dr. Excell Seltzer for the first week in October.  A total of 15 minutes was used on this encounter with greater than 50% of it used for counseling and coordination of care.   Ricarda Frame, MD FACS General Surgeon  07/20/2017,10:03 AM

## 2017-07-20 NOTE — Patient Instructions (Signed)
We will see you back one last time before we could schedule your surgery on 08/24/2017.

## 2017-07-20 NOTE — Addendum Note (Signed)
Addended by: Ricarda Frame T on: 07/20/2017 11:17 AM   Modules accepted: Orders, SmartSet

## 2017-07-21 ENCOUNTER — Telehealth: Payer: Self-pay

## 2017-07-21 ENCOUNTER — Telehealth: Payer: Self-pay | Admitting: General Surgery

## 2017-07-21 NOTE — Telephone Encounter (Signed)
Medication (methotrexate 50 MG/2ML injection) Clearance was faxed to Dr. Saverio Danker. Blood thinner medication (Eliquis) Clearance was faxed to Dr. Sampson Goon. Awaiting on clearances for patient to have her surgery on 08/24/2017.  Awaiting on response.

## 2017-07-21 NOTE — Telephone Encounter (Signed)
Pt advised of pre op date/time and sx date. Sx: 08/24/17 with Dr Chelsea Aus assisting-Open ileostomy reversal x 3.  Pre op: 08/15/17 @ 9:00am--office.   Patient made aware to call [phone], between 1-3:00pm the day before surgery, to find out what time to arrive.

## 2017-07-28 NOTE — Telephone Encounter (Signed)
I received medication clearance for the Methotrexate injection. I will contact the patient with the information provided by Dr. Gavin Potters once I receive a reply from Dr. Sampson Goon and Clearance from her Eliquis.

## 2017-08-01 ENCOUNTER — Telehealth: Payer: Self-pay | Admitting: *Deleted

## 2017-08-01 ENCOUNTER — Other Ambulatory Visit: Payer: Self-pay | Admitting: Infectious Diseases

## 2017-08-01 DIAGNOSIS — Z122 Encounter for screening for malignant neoplasm of respiratory organs: Secondary | ICD-10-CM

## 2017-08-01 DIAGNOSIS — Z87891 Personal history of nicotine dependence: Secondary | ICD-10-CM

## 2017-08-01 DIAGNOSIS — Z1231 Encounter for screening mammogram for malignant neoplasm of breast: Secondary | ICD-10-CM

## 2017-08-01 NOTE — Telephone Encounter (Signed)
I received clearance for patient's Eliquis and Methotrexate Injection clearances.  Then I faxed the forms to Pre-[facility]mit so they could obtain a copy.

## 2017-08-01 NOTE — Telephone Encounter (Signed)
Called Dr. Jarrett Ables office and asked if they could send a message to him in reference to bridging the patient to Lovenox or Heparin and if he would order it. Awaiting on response.

## 2017-08-01 NOTE — Telephone Encounter (Signed)
Notified patient that annual lung cancer screening low dose CT scan is due currently or will be in near future. Confirmed that patient is within the age range of 55-77, and asymptomatic, (no signs or symptoms of lung cancer). Patient denies illness that would prevent curative treatment for lung cancer if found. Verified smoking history, (former, quit Sept 2017, 31.25 pack year). The shared decision making visit was done 05/29/15. Patient is agreeable for CT scan being scheduled.

## 2017-08-03 ENCOUNTER — Ambulatory Visit
Admission: RE | Admit: 2017-08-03 | Discharge: 2017-08-03 | Disposition: A | Discharge status: Home / Self Care | Payer: Medicare Other | Source: Ambulatory Visit | Attending: Infectious Diseases | Admitting: Infectious Diseases

## 2017-08-03 DIAGNOSIS — Z1231 Encounter for screening mammogram for malignant neoplasm of breast: Secondary | ICD-10-CM | POA: Diagnosis not present

## 2017-08-10 NOTE — Telephone Encounter (Signed)
Brandy returned my call and she stated that Dr. Sampson Goon was out of the office until next week. Therefore, she was not able to give me a response today. She will call me back with an answer about the Lovenox.

## 2017-08-10 NOTE — Telephone Encounter (Signed)
Called Dr. Jarrett Ables office and left a message with the front desk for Ruth Gray (Dr. Jarrett Ables nurse) so he could order the bridge for her Eliquis. Since Dr. Tonita Cong did not know if Lovenox was therapeutic or prophylactic. Awaiting on her call.

## 2017-08-15 ENCOUNTER — Ambulatory Visit
Admission: RE | Admit: 2017-08-15 | Discharge: 2017-08-15 | Disposition: A | Discharge status: Home / Self Care | Payer: Medicare Other | Source: Ambulatory Visit | Attending: General Surgery | Admitting: General Surgery

## 2017-08-15 ENCOUNTER — Encounter
Admission: RE | Admit: 2017-08-15 | Discharge: 2017-08-15 | Disposition: A | Discharge status: Home / Self Care | Payer: Medicare Other | Source: Ambulatory Visit | Attending: General Surgery | Admitting: General Surgery

## 2017-08-15 DIAGNOSIS — Z01818 Encounter for other preprocedural examination: Secondary | ICD-10-CM | POA: Insufficient documentation

## 2017-08-15 DIAGNOSIS — Z932 Ileostomy status: Secondary | ICD-10-CM | POA: Diagnosis not present

## 2017-08-15 DIAGNOSIS — Z01810 Encounter for preprocedural cardiovascular examination: Secondary | ICD-10-CM

## 2017-08-15 DIAGNOSIS — Z01812 Encounter for preprocedural laboratory examination: Secondary | ICD-10-CM | POA: Insufficient documentation

## 2017-08-15 DIAGNOSIS — I700 Atherosclerosis of aorta: Secondary | ICD-10-CM | POA: Insufficient documentation

## 2017-08-15 HISTORY — DX: Anemia, unspecified: D64.9

## 2017-08-15 HISTORY — DX: Acute embolism and thrombosis of unspecified deep veins of unspecified lower extremity: I82.409

## 2017-08-15 LAB — BASIC METABOLIC PANEL
ANION GAP: 4 — AB (ref 5–15)
BUN: 15 mg/dL (ref 6–20)
CHLORIDE: 104 mmol/L (ref 101–111)
CO2: 29 mmol/L (ref 22–32)
Calcium: 9.2 mg/dL (ref 8.9–10.3)
Creatinine, Ser: 1.15 mg/dL — ABNORMAL HIGH (ref 0.44–1.00)
GFR calc Af Amer: 55 mL/min — ABNORMAL LOW (ref >60)
GFR, EST NON AFRICAN AMERICAN: 47 mL/min — AB (ref >60)
GLUCOSE: 85 mg/dL (ref 65–99)
POTASSIUM: 3.8 mmol/L (ref 3.5–5.1)
Sodium: 137 mmol/L (ref 135–145)

## 2017-08-15 LAB — CBC
HEMATOCRIT: 32.4 % — AB (ref 35.0–47.0)
HEMOGLOBIN: 10.8 g/dL — AB (ref 12.0–16.0)
MCH: 32 pg (ref 26.0–34.0)
MCHC: 33.5 g/dL (ref 32.0–36.0)
MCV: 95.7 fL (ref 80.0–100.0)
Platelets: 110 10*3/uL — ABNORMAL LOW (ref 150–440)
RBC: 3.38 MIL/uL — AB (ref 3.80–5.20)
RDW: 14.6 % — ABNORMAL HIGH (ref 11.5–14.5)
WBC: 6.1 10*3/uL (ref 3.6–11.0)

## 2017-08-15 LAB — HEMOGLOBIN A1C
Hgb A1c MFr Bld: 5.7 % — ABNORMAL HIGH (ref 4.8–5.6)
Mean Plasma Glucose: 116.89 mg/dL

## 2017-08-15 LAB — SURGICAL PCR SCREEN
MRSA, PCR: NEGATIVE
Staphylococcus aureus: NEGATIVE

## 2017-08-15 NOTE — Patient Instructions (Signed)
Your procedure is scheduled on: August 24, 2017 Report to Same Day Surgery on the 2nd floor in the [facility]. To find out your arrival time, please call [phone] between 1PM - 3PM on: August 23, 2017  REMEMBER: Instructions that are not followed completely may result in serious medical risk up to and including death; or upon the discretion of your surgeon and anesthesiologist your surgery may need to be rescheduled.  Do not eat food or drink liquids after midnight. No gum chewing or hard candies.  You may however, drink CLEAR liquids up to 2 hours before you are scheduled to arrive at the hospital for your procedure.  Do not drink clear liquids within 2 hours of your scheduled arrival to the hospital as this may lead to your procedure being delayed or rescheduled.  Clear liquids include: - water  - apple juice without pulp - clear gatorade - black coffee or tea (NO milk, creamers, sugars) DO NOT drink anything not on this list.  Type 1 and Type 2 diabetics should only drink water.  No Alcohol for 24 hours before or after surgery.  No Smoking for 24 hours prior to surgery.  Notify your doctor if there is any change in your medical condition (cold, fever, infection).  Do not wear jewelry, make-up, hairpins, clips or nail polish.  Do not wear lotions, powders, or perfumes.   Do not shave 48 hours prior to surgery. Men may shave face and neck.  Contacts and dentures may not be worn into surgery.  Do not bring valuables to the hospital. [facility] is not responsible for any belongings or valuables.   TAKE THESE MEDICATIONS THE MORNING OF SURGERY WITH A SIP OF WATER: METOPROLOL   Use CHG Soap or wipes as directed on instruction sheet.  Follow recommendations from Cardiologist, Pulmonologist or PCP regarding stopping Aspirin, Coumadin, Plavix, Eliquis, Pradaxa, or Pletal.  Stop Anti-inflammatories such as Advil, Aleve, Ibuprofen, Motrin, Naproxen, Naprosyn, Goodie  powder, or aspirin products. (May take Tylenol or Acetaminophen and Celebrex if needed.) (PT WILL STOP ELIQUIS 5 DAYS BEFORE SURGERY  AND METHOTREXATE  7 DAYS BEFORE SURGERYPER MD INSTRUCTION ) PT WILL DR Tonita Cong ON 08-17-2017 AND WILL GET INSTRUCTION ON LOVENOX BRIDGE  Stop supplements until after surgery FOLIC . (May continue Vitamin D, Vitamin B, and multivitamin.)  If you are being admitted to the hospital overnight, leave your suitcase in the car. After surgery it may be brought to your room.  If you are being discharged the day of surgery, you will not be allowed to drive home. You will need someone to drive you home and stay with you that night.   If you are taking public transportation, you will need to have a responsible adult to with you.  Please call the number above if you have any questions about these instructions.

## 2017-08-17 ENCOUNTER — Ambulatory Visit
Admission: RE | Admit: 2017-08-17 | Discharge: 2017-08-17 | Disposition: A | Discharge status: Home / Self Care | Payer: Medicare Other | Source: Ambulatory Visit | Attending: Oncology | Admitting: Oncology

## 2017-08-17 ENCOUNTER — Ambulatory Visit (INDEPENDENT_AMBULATORY_CARE_PROVIDER_SITE_OTHER): Payer: Medicare Other | Admitting: General Surgery

## 2017-08-17 ENCOUNTER — Encounter: Payer: Self-pay | Admitting: General Surgery

## 2017-08-17 VITALS — BP 126/79 | HR 74 | Temp 98.3°F | Ht 67.0 in | Wt 163.2 lb

## 2017-08-17 DIAGNOSIS — I700 Atherosclerosis of aorta: Secondary | ICD-10-CM | POA: Diagnosis not present

## 2017-08-17 DIAGNOSIS — J432 Centrilobular emphysema: Secondary | ICD-10-CM | POA: Diagnosis not present

## 2017-08-17 DIAGNOSIS — J438 Other emphysema: Secondary | ICD-10-CM | POA: Diagnosis not present

## 2017-08-17 DIAGNOSIS — Z122 Encounter for screening for malignant neoplasm of respiratory organs: Secondary | ICD-10-CM | POA: Insufficient documentation

## 2017-08-17 DIAGNOSIS — K5730 Diverticulosis of large intestine without perforation or abscess without bleeding: Secondary | ICD-10-CM | POA: Diagnosis not present

## 2017-08-17 DIAGNOSIS — I2510 Atherosclerotic heart disease of native coronary artery without angina pectoris: Secondary | ICD-10-CM | POA: Diagnosis not present

## 2017-08-17 DIAGNOSIS — Z932 Ileostomy status: Secondary | ICD-10-CM | POA: Diagnosis not present

## 2017-08-17 DIAGNOSIS — Z87891 Personal history of nicotine dependence: Secondary | ICD-10-CM | POA: Insufficient documentation

## 2017-08-17 NOTE — Telephone Encounter (Signed)
Called patient and had to leave her a voicemail to call me back.  Please read to her the previous message.

## 2017-08-17 NOTE — Patient Instructions (Addendum)
We have spoken today about reversing your Ostomy and creating a Loop Ileostomy. We will arrange this to be done on 08/24/17 by Dr. Tonita Cong at [facility].   Plan on being in the hospital between 5-7 days after surgery. You will be started on a liquid diet and then advanced as tolerated prior to going home.  You will STOP taking your Eliquis beginning on Friday, 08/19/17 and this will restarted in the hospital.  Please see further information below. If you have any questions or concerns, please call our office.  End Colostomy Reversal An end colostomy reversal is surgery that reverses an end colostomy. The large intestine is disconnected from the opening in the abdomen (stoma). Then it is reconnected to the large intestine inside the body. A stoma and pouch are no longer needed. Bowel movements can resume through the rectum. LET [facility] CARE PROVIDER KNOW ABOUT:  Allergies to food or medicine.  Medicines taken, including vitamins, health supplements, herbs, eye drops, over-the-counter medicines, and creams.  Use of steroids (by mouth or creams).  Previous problems with anesthetics or numbing medicines.  History of bleeding problems or blood clots.  Previous surgery.  Other health problems, including diabetes and kidney problems.  Possibility of pregnancy, if this applies. RISKS AND COMPLICATIONS General surgical complications may include the following:  Reaction to anesthetics.  Damage to surrounding nerves, tissues, or structures.  Blood clot.  Bleeding.  Scarring. Specific risks for colostomy reversal, while rare, may include:  Intestinal paralysis (ileus). This is a normal part of recovery. It usually goes away in 3-7 days. However, it can last longer in some people.  Leaking at the joined part of the intestine (anastomotic leak).  Infection of the surgical cut (incision) or the place where the stoma was located.  A collection of pus (abscess) in the abdomen or  pelvis.  Intestinal blockage.  Narrowing at the joined part of the intestine (stricture).  Urinary and sexual dysfunction. BEFORE THE PROCEDURE It is important to follow your health care provider's instructions prior to your procedure. This will help you to avoid complications. Steps before your procedure may include:  A physical exam, rectal exam, X-rays, colonoscopy, and other procedures.  Chemotherapy or radiation therapy, if the stoma was created due to cancer.  A review of the procedure, the anesthetic being used, and what to expect after the procedure. You may be asked to:  Stop taking certain medicines for several days prior to your procedure. These may include blood thinners (such as aspirin).  Take certain medicines, such as antibiotics or stool softeners.  Avoid eating and drinking after midnight the night before the procedure. This will help you to avoid complications from the anesthetic.  Quit smoking. Smoking increases the chances of a healing problem after your procedure. PROCEDURE You will be given medicine that makes you sleep (general anesthetic). The procedure may be done as open surgery, with a large incision. It may also be done as laparoscopic surgery, with several smaller incisions. The surgeon will stitch or staple the intestine ends back together. This surgery takes several hours. AFTER THE PROCEDURE  You will be given pain medicine.  Slowly increase your diet and movement as directed by your health care provider.  You should arrange for someone to help you with activities at home while you recover.   This information is not intended to replace advice given to you by your health care provider. Make sure you discuss any questions you have with your health care provider.  Document Released: 01/31/2012 Document Revised: 03/25/2015 Document Reviewed: 01/31/2012 Elsevier Interactive Patient Education 2016 [employer].   Loop Ileostomy Creation Loop  ileostomy is a procedure to permanently or temporarily redirect part of the small intestine (ileum) to an external opening (stoma) in the abdomen. This means that waste passes through the stoma and into an external bag (ostomy pouch) instead of through the rest of the intestines and the rectum (bowel). This procedure may be necessary when the bowel is diseased or partially removed. Tell a health care provider about:  Any allergies you have.  All medicines you are taking, including vitamins, herbs, eye drops, creams, and over-the-counter medicines.  Any problems you or family members have had with anesthetic medicines.  Any blood disorders you have.  Any surgeries you have had.  Any medical conditions you have.  Whether you are pregnant or may be pregnant. What are the risks? Generally, this is a safe procedure. However, problems may occur, including:  Infection.  Bleeding.  Allergic reactions to medicines.  Damage to other structures or organs.  Skin irritation around the stoma.  Narrowing or collapsing of the stoma.  Blockage of the intestine (ileus).  Tissue bulging through a weak spot in the abdominal muscles (hernia).  Part of the small intestine coming out through the stoma (prolapse).  Difficulty absorbing nutrients from foods.  Dehydration.  What happens before the procedure?  You will have a physical exam, which may include a rectal exam.  You may have tests, such as: ? Blood tests. ? Stool tests. ? X-rays. ? Colonoscopy.  Follow instructions from your health care provider about eating or drinking restrictions.  Ask your health care provider about: ? Changing or stopping your regular medicines. This is especially important if you are taking diabetes medicines or blood thinners. ? Taking medicines such as aspirin and ibuprofen. These medicines can thin your blood. Do not take these medicines before your procedure if your health care provider instructs you  not to.  Ask your health care provider how your surgical site will be marked or identified.  You may be given antibiotic medicine to help prevent infection.  Do not use tobacco products, including cigarettes, chewing tobacco, or e-cigarettes. If you need help quitting, ask your health care provider.  Plan to have someone take you home after the procedure.  If you will be going home right after the procedure, plan to have someone with you for 24 hours. What happens during the procedure?  To reduce your risk of infection: ? Your health care team will wash or sanitize their hands. ? Your skin will be washed with soap.  An IV tube will be inserted into one of your veins.  You will be given a medicine to make you fall asleep (general anesthetic). You may also be given a medicine to help you relax (sedative).  An incision will be made in your abdomen.  An incision will be made in part of your ileum to create an opening.  The opening in your ileum will be attached to your abdomen with stitches (sutures) to make the stoma.  An ostomy pouch will be attached to your stoma.  Your stoma will be covered with a bandage (dressing). The procedure may vary among health care providers and hospitals. What happens after the procedure?  You may continue to receive fluids and medicines through an IV tube.  You will have some pain. Medicines will be available to help you.  Your blood pressure, heart rate, breathing  rate, and blood oxygen level will be monitored often until the medicines you were given have worn off.  You may not be able to drink fluids normally or eat solid food until at least 24 hours after your procedure. You may be given ice chips to suck on until you are able to drink fluids normally.  You may have fluid draining from your stoma and your rectum.  You will be taught how to care for your stoma and ostomy pouch.  Your stoma may be dark-colored, swollen, and bruised.  You may  have to wear compression stockings. These stockings help to prevent blood clots and reduce swelling in your legs.  Do not drive for 24 hours if you received a sedative. This information is not intended to replace advice given to you by your health care provider. Make sure you discuss any questions you have with your health care provider. Document Released: 06/21/2011 Document Revised: 12/04/2015 Document Reviewed: 08/29/2015 Elsevier Interactive Patient Education  [employer].

## 2017-08-17 NOTE — Telephone Encounter (Signed)
Ruth Gray (patient's son) called me back and I explained what his mother is to do with her Eliquis and taking Lovenox. He understood and had no further questions.

## 2017-08-17 NOTE — Progress Notes (Signed)
Outpatient Surgical Follow Up  08/17/2017  Ruth Gray is an 71 y.o. female.   Chief Complaint  Patient presents with  . Follow-up    Discuss Ileostomy Reversal    HPI: 71 year old female who is well-known the surgery service returns to clinic today for repeat exam prior to her scheduled ostomy reversal next week. Patient reports she continues to do well. She is eating well and having normal ostomy output. She denies any fevers, chills, nausea, vomiting, chest pain, shortness of breath. She continues to have an anterior abdominal wound that is healing well. She did however have a left ankle injury and is now in a walking boot. She states she'll be abdomen walking boot prior to surgery next week. She has no other changes since her last visit.  Past Medical History:  Diagnosis Date  . Anemia   . Collagen vascular disease (HCC)   . DVT (deep venous thrombosis) (HCC)    left leg  . Perforation of colon (HCC) 08/2016  . Rheumatoid arteritis     Past Surgical History:  Procedure Laterality Date  . ABDOMINAL HYSTERECTOMY    . CENTRAL VENOUS CATHETER INSERTION  09/21/2016   Procedure: INSERTION CENTRAL LINE ADULT;  Surgeon: Tiney Rouge III, MD;  Location: [facility] ORS;  Service: General;;  . COLON RESECTION  09/21/2016   Procedure: COLON RESECTION- Ascending and Sigmoid;  Surgeon: Tiney Rouge III, MD;  Location: [facility] ORS;  Service: General;;  . COLOSTOMY N/A 09/25/2016   Procedure: COLOSTOMY;  Surgeon: Tiney Rouge III, MD;  Location: [facility] ORS;  Service: General;  Laterality: N/A;  . DEBRIDEMENT OF ABDOMINAL WALL ABSCESS N/A 12/03/2016   Procedure: DEBRIDEMENT OF ABDOMINAL WALL ABSCESS;  Surgeon: Leafy Ro, MD;  Location: [facility] ORS;  Service: General;  Laterality: N/A;  . FOOT SURGERY Left   . LAPAROTOMY N/A 09/21/2016   Procedure: EXPLORATORY LAPAROTOMY;  Surgeon: Tiney Rouge III, MD;  Location: [facility] ORS;  Service: General;  Laterality: N/A;  . LAPAROTOMY N/A 09/25/2016   Procedure: EXPLORATORY  LAPAROTOMY and right colon resection;  Surgeon: Tiney Rouge III, MD;  Location: [facility] ORS;  Service: General;  Laterality: N/A;  . REPLACEMENT TOTAL KNEE Left     Family History  Problem Relation Age of Onset  . Breast cancer Other 36  . Prostate cancer Father   . Ovarian cancer Sister   . Kidney cancer Brother   . Lung cancer Sister     Social History:  reports that she quit smoking about 13 months ago. Her smoking use included Cigarettes. She smoked 0.50 packs per day. She has never used smokeless tobacco. She reports that she does not drink alcohol or use drugs.  Allergies:  Allergies  Allergen Reactions  . Ciprofloxacin Swelling  . Penicillins Swelling and Rash    Has patient had a PCN reaction causing immediate rash, facial/tongue/throat swelling, SOB or lightheadedness with hypotension: yes Has patient had a PCN reaction causing severe rash involving mucus membranes or skin necrosis: no Has patient had a PCN reaction that required hospitalization no Has patient had a PCN reaction occurring within the last 10 years: no If all of the above answers are "NO", then may proceed with Cephalosporin use.   . Tramadol Nausea And Vomiting    Medications reviewed.    ROS A multipoint review of systems was completed, all pertinent positives and negatives are documented within the history of present illness and remainder are negative   BP 126/79   Pulse 74  Temp 98.3 F (36.8 C) (Oral)   Ht 5\' 7"  (1.702 m)   Wt 74 kg (163 lb 3.2 oz)   BMI 25.56 kg/m   Physical Exam Gen.: No acute distress Neck: Supple and nontender Chest: Clear to auscultation Heart: Regular rate and rhythm Abdomen: Soft, nontender, nondistended. In ileostomy present in the right lower quadrant that is pink, patent, productive of stool. 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 Small infraumbilical wound with healthy-appearing granulation  tissue. Extremities: Moves all extremities well. Left foot in a walking boot.    No results found for this or any previous visit (from the past 48 hour(s)). Ct Chest Lung Cancer Screening Low Dose Wo Contrast  Result Date: 08/17/2017 CLINICAL DATA:  71 year old female former smoker (quit in September 2017) with 31 pack-year history of smoking. Lung cancer screening examination. EXAM: CT CHEST WITHOUT CONTRAST LOW-DOSE FOR LUNG CANCER SCREENING TECHNIQUE: Multidetector CT imaging of the chest was performed following the standard protocol without IV contrast. COMPARISON:  Low-dose lung cancer screening chest CT 08/09/2016. FINDINGS: Cardiovascular: Heart size is normal. There is no significant pericardial fluid, thickening or pericardial calcification. There is aortic atherosclerosis, as well as atherosclerosis of the great vessels of the mediastinum and the coronary arteries, including calcified atherosclerotic plaque in the left main, left anterior descending, left circumflex and right coronary arteries. Mediastinum/Nodes: No pathologically enlarged mediastinal or hilar lymph nodes. Please note that accurate exclusion of hilar adenopathy is limited on noncontrast CT scans. Esophagus is unremarkable in appearance. No axillary lymphadenopathy. Lungs/Pleura: Several small pulmonary nodules are again noted throughout the lungs bilaterally, similar in size, number and distribution to the prior study. The largest of these is in the lateral aspect of the left lower lobe (axial image 196 of series 3) with a volume derived mean diameter of only 4.0 mm. No larger more suspicious appearing pulmonary nodules or masses are noted. No acute consolidative airspace disease. No pleural effusions. Mild diffuse bronchial wall thickening with mild centrilobular and paraseptal emphysema. Upper Abdomen: Aortic atherosclerosis. Colonic diverticulosis noted in the region of the splenic flexure of the colon. Musculoskeletal: There  are no aggressive appearing lytic or blastic lesions noted in the visualized portions of the skeleton. IMPRESSION: 1. Lung-RADS 2S, benign appearance or behavior. Continue annual screening with low-dose chest CT without contrast in 12 months. 2. The "S" modifier above refers to potentially clinically significant non lung cancer related findings. Specifically, there is aortic atherosclerosis, in addition to left main and 3 vessel coronary artery disease. Please note that although the presence of coronary artery calcium documents the presence of coronary artery disease, the severity of this disease and any potential stenosis cannot be assessed on this non-gated CT examination. Assessment for potential risk factor modification, dietary therapy or pharmacologic therapy may be warranted, if clinically indicated. 3. Mild diffuse bronchial wall thickening with mild centrilobular and paraseptal emphysema; imaging findings suggestive of underlying COPD. 4. Colonic diverticulosis. Aortic Atherosclerosis (ICD10-I70.0) and Emphysema (ICD10-J43.9). Electronically Signed   By: 08/11/2016 M.D.   On: 08/17/2017 16:47    Assessment/Plan:  1. Ileostomy in place [facility]) 71 year old female with, good surgical history returns to clinic today to discuss her ostomy reversal. Again discussed the surgical plan in detail. The plan is to reconnect her mucous fistulas to her end ileostomy and a restore bowel continuity. Discussed that given the multiple anastomoses that are anticipated to be made that a loop ileostomy was made proximal  to this. Described the risks, benefits, alternatives. Patient voiced understanding and desires to proceed. Plan as originally planned to proceed to the operating room next Wednesday, October 3. She understands that she will likely be in the hospital 7-10 days afterwards. All questions were answered the patient's satisfaction.  A total of 15 minutes was used on this encounter and greater than 50% of  it used for counseling her coordination of care.   Ricarda Frame, MD FACS General Surgeon  08/17/2017,5:05 PM

## 2017-08-17 NOTE — Telephone Encounter (Signed)
Called Dr. Jarrett Ables office and spoke with Steward Drone (RN) and she stated that Dr. Sampson Goon wanted the patient to stop her Eliquis 3 days prior (08/20/2017) to her surgery and bridge with Lovenox starting on (08/21/2017) and hold it on the day of surgery. Then patient is to restart her Eliquis.   Patient's Lovenox was called in to her pharmacy, per Dr. Jarrett Ables nurse.

## 2017-08-22 ENCOUNTER — Encounter: Payer: Self-pay | Admitting: *Deleted

## 2017-08-23 MED ORDER — CLINDAMYCIN PHOSPHATE 900 MG/50ML IV SOLN
900.0000 mg | INTRAVENOUS | Status: AC
  Administered 2017-08-24: 900 mg via INTRAVENOUS

## 2017-08-23 MED ORDER — GENTAMICIN SULFATE 40 MG/ML IJ SOLN
5.0000 mg/kg | INTRAMUSCULAR | Status: AC
  Administered 2017-08-24: 360.4 mg via INTRAVENOUS
  Filled 2017-08-23: qty 9

## 2017-08-24 ENCOUNTER — Encounter: Payer: Self-pay | Admitting: *Deleted

## 2017-08-24 ENCOUNTER — Encounter
Admission: RE | Disposition: A | Discharge status: Home Health | Payer: Self-pay | Source: Ambulatory Visit | Attending: General Surgery

## 2017-08-24 ENCOUNTER — Inpatient Hospital Stay: Payer: Medicare Other | Admitting: Certified Registered"

## 2017-08-24 ENCOUNTER — Inpatient Hospital Stay
Admission: RE | Admit: 2017-08-24 | Discharge: 2017-08-31 | DRG: 330 | Disposition: A | Discharge status: Home Health | Payer: Medicare Other | Source: Ambulatory Visit | Attending: General Surgery | Admitting: General Surgery

## 2017-08-24 DIAGNOSIS — R000 Tachycardia, unspecified: Secondary | ICD-10-CM | POA: Diagnosis not present

## 2017-08-24 DIAGNOSIS — Z880 Allergy status to penicillin: Secondary | ICD-10-CM

## 2017-08-24 DIAGNOSIS — Z8042 Family history of malignant neoplasm of prostate: Secondary | ICD-10-CM

## 2017-08-24 DIAGNOSIS — K660 Peritoneal adhesions (postprocedural) (postinfection): Secondary | ICD-10-CM | POA: Diagnosis present

## 2017-08-24 DIAGNOSIS — Z803 Family history of malignant neoplasm of breast: Secondary | ICD-10-CM

## 2017-08-24 DIAGNOSIS — Z86718 Personal history of other venous thrombosis and embolism: Secondary | ICD-10-CM

## 2017-08-24 DIAGNOSIS — Z87891 Personal history of nicotine dependence: Secondary | ICD-10-CM

## 2017-08-24 DIAGNOSIS — Z8041 Family history of malignant neoplasm of ovary: Secondary | ICD-10-CM | POA: Diagnosis not present

## 2017-08-24 DIAGNOSIS — T8130XA Disruption of wound, unspecified, initial encounter: Secondary | ICD-10-CM | POA: Diagnosis not present

## 2017-08-24 DIAGNOSIS — Z90710 Acquired absence of both cervix and uterus: Secondary | ICD-10-CM

## 2017-08-24 DIAGNOSIS — Z432 Encounter for attention to ileostomy: Principal | ICD-10-CM

## 2017-08-24 DIAGNOSIS — Z8051 Family history of malignant neoplasm of kidney: Secondary | ICD-10-CM

## 2017-08-24 DIAGNOSIS — I00 Rheumatic fever without heart involvement: Secondary | ICD-10-CM | POA: Diagnosis present

## 2017-08-24 DIAGNOSIS — Z932 Ileostomy status: Secondary | ICD-10-CM

## 2017-08-24 DIAGNOSIS — Z801 Family history of malignant neoplasm of trachea, bronchus and lung: Secondary | ICD-10-CM | POA: Diagnosis not present

## 2017-08-24 DIAGNOSIS — Z881 Allergy status to other antibiotic agents status: Secondary | ICD-10-CM

## 2017-08-24 DIAGNOSIS — D649 Anemia, unspecified: Secondary | ICD-10-CM | POA: Diagnosis present

## 2017-08-24 DIAGNOSIS — Z96652 Presence of left artificial knee joint: Secondary | ICD-10-CM | POA: Diagnosis present

## 2017-08-24 HISTORY — PX: LAPAROTOMY: SHX154

## 2017-08-24 HISTORY — PX: ILEOSTOMY CLOSURE: SHX1784

## 2017-08-24 HISTORY — PX: ILEOSTOMY: SHX1783

## 2017-08-24 LAB — CBC
HCT: 29.9 % — ABNORMAL LOW (ref 35.0–47.0)
Hemoglobin: 10.1 g/dL — ABNORMAL LOW (ref 12.0–16.0)
MCH: 32.1 pg (ref 26.0–34.0)
MCHC: 33.9 g/dL (ref 32.0–36.0)
MCV: 94.9 fL (ref 80.0–100.0)
PLATELETS: 205 10*3/uL (ref 150–440)
RBC: 3.15 MIL/uL — AB (ref 3.80–5.20)
RDW: 15.2 % — ABNORMAL HIGH (ref 11.5–14.5)
WBC: 4.1 10*3/uL (ref 3.6–11.0)

## 2017-08-24 LAB — CREATININE, SERUM
Creatinine, Ser: 1.18 mg/dL — ABNORMAL HIGH (ref 0.44–1.00)
GFR calc Af Amer: 53 mL/min — ABNORMAL LOW (ref >60)
GFR calc non Af Amer: 46 mL/min — ABNORMAL LOW (ref >60)

## 2017-08-24 SURGERY — CLOSURE, ILEOSTOMY
Anesthesia: General | Wound class: Clean Contaminated

## 2017-08-24 MED ORDER — ROCURONIUM BROMIDE 50 MG/5ML IV SOLN
INTRAVENOUS | Status: AC
  Filled 2017-08-24: qty 1

## 2017-08-24 MED ORDER — LIDOCAINE HCL (PF) 1 % IJ SOLN
INTRAMUSCULAR | Status: DC | PRN
  Administered 2017-08-24: 15 mL

## 2017-08-24 MED ORDER — BUPIVACAINE HCL (PF) 0.5 % IJ SOLN
INTRAMUSCULAR | Status: AC
  Filled 2017-08-24: qty 30

## 2017-08-24 MED ORDER — CLINDAMYCIN PHOSPHATE 900 MG/50ML IV SOLN
900.0000 mg | Freq: Three times a day (TID) | INTRAVENOUS | Status: DC
  Administered 2017-08-24 – 2017-08-25 (×3): 900 mg via INTRAVENOUS
  Filled 2017-08-24 (×5): qty 50

## 2017-08-24 MED ORDER — ONDANSETRON HCL 4 MG/2ML IJ SOLN
INTRAMUSCULAR | Status: AC
Start: 2017-08-24 — End: 2017-08-24
  Filled 2017-08-24: qty 2

## 2017-08-24 MED ORDER — FAMOTIDINE 20 MG PO TABS
ORAL_TABLET | ORAL | Status: AC
  Administered 2017-08-24: 20 mg via ORAL
  Filled 2017-08-24: qty 1

## 2017-08-24 MED ORDER — ACETAMINOPHEN 10 MG/ML IV SOLN
INTRAVENOUS | Status: AC
  Filled 2017-08-24: qty 100

## 2017-08-24 MED ORDER — GENTAMICIN SULFATE 40 MG/ML IJ SOLN
5.0000 mg/kg | INTRAVENOUS | Status: DC
  Filled 2017-08-24: qty 9.25

## 2017-08-24 MED ORDER — DIPHENHYDRAMINE HCL 12.5 MG/5ML PO ELIX
12.5000 mg | ORAL_SOLUTION | Freq: Four times a day (QID) | ORAL | Status: DC | PRN
  Filled 2017-08-24: qty 5

## 2017-08-24 MED ORDER — MIDAZOLAM HCL 2 MG/2ML IJ SOLN
INTRAMUSCULAR | Status: DC | PRN
  Administered 2017-08-24 (×2): 1 mg via INTRAVENOUS

## 2017-08-24 MED ORDER — LIDOCAINE HCL (CARDIAC) 20 MG/ML IV SOLN
INTRAVENOUS | Status: DC | PRN
  Administered 2017-08-24: 60 mg via INTRAVENOUS

## 2017-08-24 MED ORDER — CLINDAMYCIN PHOSPHATE 900 MG/50ML IV SOLN
INTRAVENOUS | Status: AC
  Filled 2017-08-24: qty 50

## 2017-08-24 MED ORDER — METOPROLOL TARTRATE 5 MG/5ML IV SOLN
5.0000 mg | Freq: Four times a day (QID) | INTRAVENOUS | Status: DC
  Administered 2017-08-24 – 2017-08-25 (×4): 5 mg via INTRAVENOUS
  Filled 2017-08-24 (×5): qty 5

## 2017-08-24 MED ORDER — ONDANSETRON HCL 4 MG PO TABS: 4.0000 mg | ORAL_TABLET | Freq: Four times a day (QID) | ORAL | Status: DC | PRN

## 2017-08-24 MED ORDER — ENOXAPARIN SODIUM 40 MG/0.4ML ~~LOC~~ SOLN
40.0000 mg | Freq: Once | SUBCUTANEOUS | Status: AC
  Administered 2017-08-24: 40 mg via SUBCUTANEOUS
  Filled 2017-08-24: qty 0.4

## 2017-08-24 MED ORDER — CHLORHEXIDINE GLUCONATE CLOTH 2 % EX PADS: 6.0000 | MEDICATED_PAD | Freq: Once | CUTANEOUS | Status: DC

## 2017-08-24 MED ORDER — PHENYLEPHRINE HCL 10 MG/ML IJ SOLN
INTRAMUSCULAR | Status: AC
Start: 2017-08-24 — End: 2017-08-24
  Filled 2017-08-24: qty 1

## 2017-08-24 MED ORDER — FENTANYL CITRATE (PF) 100 MCG/2ML IJ SOLN
25.0000 ug | INTRAMUSCULAR | Status: DC | PRN
  Administered 2017-08-24 (×4): 25 ug via INTRAVENOUS

## 2017-08-24 MED ORDER — PROPOFOL 10 MG/ML IV BOLUS
INTRAVENOUS | Status: AC
  Filled 2017-08-24: qty 40

## 2017-08-24 MED ORDER — DEXTROSE IN LACTATED RINGERS 5 % IV SOLN
INTRAVENOUS | Status: DC
  Administered 2017-08-24 – 2017-08-30 (×16): via INTRAVENOUS

## 2017-08-24 MED ORDER — METOPROLOL TARTRATE 25 MG PO TABS: 12.5000 mg | ORAL_TABLET | Freq: Two times a day (BID) | ORAL | Status: DC

## 2017-08-24 MED ORDER — LACTATED RINGERS IV SOLN
INTRAVENOUS | Status: DC
  Administered 2017-08-24 (×3): via INTRAVENOUS

## 2017-08-24 MED ORDER — ENOXAPARIN SODIUM 40 MG/0.4ML ~~LOC~~ SOLN
40.0000 mg | SUBCUTANEOUS | Status: DC
  Administered 2017-08-25 – 2017-08-28 (×4): 40 mg via SUBCUTANEOUS
  Filled 2017-08-24 (×4): qty 0.4

## 2017-08-24 MED ORDER — MORPHINE SULFATE (PF) 4 MG/ML IV SOLN
4.0000 mg | INTRAVENOUS | Status: DC | PRN
  Administered 2017-08-24 – 2017-08-25 (×5): 4 mg via INTRAVENOUS
  Filled 2017-08-24 (×5): qty 1

## 2017-08-24 MED ORDER — DEXAMETHASONE SODIUM PHOSPHATE 10 MG/ML IJ SOLN
INTRAMUSCULAR | Status: DC | PRN
  Administered 2017-08-24: 10 mg via INTRAVENOUS

## 2017-08-24 MED ORDER — DIPHENHYDRAMINE HCL 50 MG/ML IJ SOLN: 12.5000 mg | Freq: Four times a day (QID) | INTRAMUSCULAR | Status: DC | PRN

## 2017-08-24 MED ORDER — FENTANYL CITRATE (PF) 100 MCG/2ML IJ SOLN
INTRAMUSCULAR | Status: AC
  Administered 2017-08-24: 25 ug via INTRAVENOUS
  Filled 2017-08-24: qty 2

## 2017-08-24 MED ORDER — OXYCODONE HCL 5 MG/5ML PO SOLN: 5.0000 mg | Freq: Once | ORAL | Status: DC | PRN

## 2017-08-24 MED ORDER — PROPOFOL 10 MG/ML IV BOLUS
INTRAVENOUS | Status: DC | PRN
  Administered 2017-08-24: 120 mg via INTRAVENOUS

## 2017-08-24 MED ORDER — MIDAZOLAM HCL 2 MG/2ML IJ SOLN
INTRAMUSCULAR | Status: AC
  Filled 2017-08-24: qty 2

## 2017-08-24 MED ORDER — ACETAMINOPHEN 10 MG/ML IV SOLN
INTRAVENOUS | Status: DC | PRN
  Administered 2017-08-24: 1000 mg via INTRAVENOUS

## 2017-08-24 MED ORDER — FAMOTIDINE 20 MG PO TABS
20.0000 mg | ORAL_TABLET | Freq: Once | ORAL | Status: AC
  Administered 2017-08-24: 20 mg via ORAL

## 2017-08-24 MED ORDER — FENTANYL CITRATE (PF) 250 MCG/5ML IJ SOLN
INTRAMUSCULAR | Status: AC
  Filled 2017-08-24: qty 5

## 2017-08-24 MED ORDER — BUPIVACAINE HCL (PF) 0.5 % IJ SOLN
INTRAMUSCULAR | Status: DC | PRN
  Administered 2017-08-24: 15 mL

## 2017-08-24 MED ORDER — HYDRALAZINE HCL 20 MG/ML IJ SOLN: 10.0000 mg | INTRAMUSCULAR | Status: DC | PRN

## 2017-08-24 MED ORDER — ONDANSETRON HCL 4 MG/2ML IJ SOLN
4.0000 mg | Freq: Four times a day (QID) | INTRAMUSCULAR | Status: DC | PRN
  Administered 2017-08-26 – 2017-08-29 (×5): 4 mg via INTRAVENOUS
  Filled 2017-08-24 (×6): qty 2

## 2017-08-24 MED ORDER — ONDANSETRON HCL 4 MG/2ML IJ SOLN
INTRAMUSCULAR | Status: DC | PRN
  Administered 2017-08-24: 4 mg via INTRAVENOUS

## 2017-08-24 MED ORDER — SUGAMMADEX SODIUM 200 MG/2ML IV SOLN
INTRAVENOUS | Status: DC | PRN
  Administered 2017-08-24: 148 mg via INTRAVENOUS

## 2017-08-24 MED ORDER — LIDOCAINE HCL (PF) 1 % IJ SOLN
INTRAMUSCULAR | Status: AC
  Filled 2017-08-24: qty 30

## 2017-08-24 MED ORDER — FENTANYL CITRATE (PF) 100 MCG/2ML IJ SOLN
INTRAMUSCULAR | Status: DC | PRN
  Administered 2017-08-24 (×5): 50 ug via INTRAVENOUS

## 2017-08-24 MED ORDER — LIDOCAINE HCL (PF) 2 % IJ SOLN
INTRAMUSCULAR | Status: AC
  Filled 2017-08-24: qty 4

## 2017-08-24 MED ORDER — PROMETHAZINE HCL 25 MG/ML IJ SOLN: 6.2500 mg | INTRAMUSCULAR | Status: DC | PRN

## 2017-08-24 MED ORDER — MEPERIDINE HCL 50 MG/ML IJ SOLN: 6.2500 mg | INTRAMUSCULAR | Status: DC | PRN

## 2017-08-24 MED ORDER — ROCURONIUM BROMIDE 100 MG/10ML IV SOLN
INTRAVENOUS | Status: DC | PRN
  Administered 2017-08-24 (×3): 20 mg via INTRAVENOUS
  Administered 2017-08-24: 50 mg via INTRAVENOUS

## 2017-08-24 MED ORDER — DEXAMETHASONE SODIUM PHOSPHATE 10 MG/ML IJ SOLN
INTRAMUSCULAR | Status: AC
  Filled 2017-08-24: qty 1

## 2017-08-24 MED ORDER — OXYCODONE HCL 5 MG PO TABS: 5.0000 mg | ORAL_TABLET | Freq: Once | ORAL | Status: DC | PRN

## 2017-08-24 SURGICAL SUPPLY — 38 items
CANISTER SUCT 1200ML W/VALVE (MISCELLANEOUS) ×4 IMPLANT
CATH FOL LEG HOLDER (MISCELLANEOUS) ×2 IMPLANT
CHLORAPREP W/TINT 26ML (MISCELLANEOUS) ×4 IMPLANT
DRAIN PENROSE 1/4X12 LTX (DRAIN) IMPLANT
DRAPE LAPAROTOMY 100X77 ABD (DRAPES) ×4 IMPLANT
DRSG OPSITE POSTOP 4X12 (GAUZE/BANDAGES/DRESSINGS) ×2 IMPLANT
DRSG OPSITE POSTOP 4X6 (GAUZE/BANDAGES/DRESSINGS) ×4 IMPLANT
ELECT BLADE 6.5 EXT (BLADE) ×2 IMPLANT
ELECT CAUTERY BLADE 6.4 (BLADE) ×8 IMPLANT
ELECT REM PT RETURN 9FT ADLT (ELECTROSURGICAL) ×4
ELECTRODE REM PT RTRN 9FT ADLT (ELECTROSURGICAL) ×2 IMPLANT
GAUZE SPONGE 4X4 12PLY STRL (GAUZE/BANDAGES/DRESSINGS) ×6 IMPLANT
GLOVE BIO SURGEON STRL SZ7.5 (GLOVE) ×12 IMPLANT
GLOVE INDICATOR 8.0 STRL GRN (GLOVE) ×10 IMPLANT
GOWN STRL REUS W/ TWL LRG LVL3 (GOWN DISPOSABLE) ×2 IMPLANT
GOWN STRL REUS W/ TWL XL LVL3 (GOWN DISPOSABLE) ×2 IMPLANT
GOWN STRL REUS W/TWL LRG LVL3 (GOWN DISPOSABLE) ×16
GOWN STRL REUS W/TWL XL LVL3 (GOWN DISPOSABLE) ×16
LABEL OR SOLS (LABEL) ×4 IMPLANT
LIGASURE IMPACT 36 18CM CVD LR (INSTRUMENTS) ×2 IMPLANT
NEEDLE HYPO 22GX1.5 SAFETY (NEEDLE) ×4 IMPLANT
NS IRRIG 1000ML POUR BTL (IV SOLUTION) ×4 IMPLANT
PACK BASIN MAJOR ARMC (MISCELLANEOUS) ×4 IMPLANT
PAD ABD DERMACEA PRESS 5X9 (GAUZE/BANDAGES/DRESSINGS) ×8 IMPLANT
RELOAD LINEAR CUT PROX 55 BLUE (ENDOMECHANICALS) ×36 IMPLANT
RELOAD STAPLE 55 3.8 BLU REG (ENDOMECHANICALS) IMPLANT
RETAINER VISCERA MED (MISCELLANEOUS) ×2 IMPLANT
SPONGE LAP 18X18 5 PK (GAUZE/BANDAGES/DRESSINGS) ×8 IMPLANT
STAPLER PROX 25M (MISCELLANEOUS) ×2 IMPLANT
STAPLER PROXIMATE 55 BLUE (STAPLE) ×2 IMPLANT
STAPLER SKIN PROX 35W (STAPLE) ×4 IMPLANT
SUT PDS #1 CTX NDL (SUTURE) ×8 IMPLANT
SUT PROLENE 2 0 SH DA (SUTURE) ×2 IMPLANT
SUT SILK 2 0 SH (SUTURE) ×4 IMPLANT
SUT SILK 3-0 (SUTURE) ×12 IMPLANT
SYR BULB IRRIG 60ML STRL (SYRINGE) ×2 IMPLANT
SYRINGE 10CC LL (SYRINGE) ×4 IMPLANT
TRAY FOLEY W/METER SILVER 16FR (SET/KITS/TRAYS/PACK) ×2 IMPLANT

## 2017-08-24 NOTE — Op Note (Addendum)
Pre-operative Diagnosis: ileostomy in place  Post-operative Diagnosis: Same  Procedure performed: Exploratory laparotomy, extensive lysis of adhesions, Splenic flexure mobilization, colo-rectal stapled anastomosis, ileocolic anastomosis, debridement of abdominal wall, loop ileostomy creation  Surgeon: Ricarda Frame   Assistants: Dr. Dionne Milo  Anesthesia: General endotracheal anesthesia  ASA Class: 2  Surgeon: Ricarda Frame, MD FACS  Anesthesia: Gen. with endotracheal tube  Assistant:PA Student  Procedure Details  The patient was seen again in the Holding Room. The benefits, complications, treatment options, and expected outcomes were discussed with the patient. The risks of bleeding, infection, recurrence of symptoms, failure to resolve symptoms,  bowel injury, any of which could require further surgery were reviewed with the patient.   The patient was taken to Operating Room, identified as Ruth Gray and the procedure verified.  A Time Out was held and the above information confirmed.  Prior to the induction of general anesthesia, antibiotic prophylaxis was administered. VTE prophylaxis was in place. General endotracheal anesthesia was then administered and tolerated well. After the induction, the abdomen And perineum was prepped with Betadineand draped in the sterile fashion. The patient was positioned in the Low lithotomy position.  All of our planned incision sites were localized with a 50-50 mixture of 0.5% Marcaine and 1% lidocaine. The previous midline incision was opened sharply with a 10 blade scalpel. Using Bovie cautery and blunt dissection taken down to the level of the fascia. The fascia was then sharply entered into and opened along its length with electrocautery.The abdomen was explored and found to be filled with extensive adhesions between small bowel, large bowel, abdominal wall. Approximately 2 hours of adhesiolysis was undertaken using a combination of  sharp dissection and electrocautery.The previous lower abdominal wall abscess cavity was encountered. This was excised in toto using electrocautery. The abscess cavity measured 2 x 1.5 x 6 cm. This Resection included skin, subcutaneous tissue, the superficial aspect of muscle.  Her prior colonic dissection was carefully identified. The rectal stump Prolene sutering was also identified.The splenic flexure had to be mobilized which was freed up with accommodation of electrocautery, blunt dissection, LigaSure device. Once this was done the distal mucous fistula was sharply dissected free from the skin and soft tissues and using electrocautery was freed up from all this attachments until it could be delivered into the abdomen. The identical procedure was done to the proximal mucous fistula. With this the distal end was noted to not have adequate mobility to reach the pelvis. Additional adhesiolysis and mobilization of the splenic flexure was undertaken until there was adequate length to allow for a tension-free anastomosis.Both mobilized colonic stumps had fresh staple lines created with the closed skin openings excised and passed off the field as specimens. The end ileostomy was then sharply dissected free from its skin attachments and using electrocautery was freed up to allow it to be delivered into the abdominal cavity as well. An additional staple fire was used in the identical former to free up normal bowel.  At this point the distal colonic staple line was opened sharply with Metzenbaum scissors and using EEA sizers were found only to accept a 25 EEA stapler. A2-0 Prolene suture was used to create a pursestring to secure the anvil from the aforementioned stapler. I then proceeded to the rectum and digitally dilated the rectum so that it could accept the sizer. Then the EEA stapler was guided into the rectum and through the guidance of Dr. Excell Seltzer was married to the aforementioned ample read the  stapler was  closed and fired. The stapler was removed and 2 complete doughnuts were identified. A leak test was performed by submerging the anastomosis in saline and instilling air into the abdomen.There was no evidence of leak on the leak test.  After re-scrubbing I returned to the abdomen. At this point we turned our attention to our ileocolonic anastomosis. Using a55 mm blue load GIA stapler a side-to-side functional end-to-end anastomosis was created. After the anastomosis was created the bowel was again run and it was noted that the small bowel to the colon was twisted creating an internal hernia. This required [place] to excise our freshly made anastomosis, returned the bowel to normal orientation and re-create the anastomosis. Once this was done in the identical fashion with the 55 blue load stapler. The Mesenteric defect was then closed with interrupted 3-0 silk suture.The entire abdomen was again reinspected with the bowel run from the ligament of Treitz to the distal rectal anastomosis. There was no evidence of damage or enterotomy to any of the bowel. An NG tube was guided into the stomach at this point and secured per anesthesia. The right upper quadrant was again inspected and there is no evidence of bleeding from the splenic flexure. We then proceeded to copiously irrigate the abdomen with 3 L of warm normal saline. An area of distal small bowel was chosen and able to be easily delivered through the previous right lower quadrant end ileostomy site. A mesenteric window was created and the ileostomy rod was inserted holding the loop outside the abdominal wall.  We then proceeded to clean closure. All members of the operative team broke scrub, re-scrubbed and then returned to the patient. Using all new, sterile drapings and equipment we went to close. The prior mucous fistula sites fascia was were closed with running #1 PDS suture. The midline was closed with 2 looped #1 PDS sutures while protecting the bowel using a  fish.Skin flaps were created using electrocautery along the midline to allow for the skin to be easily reapproximated. The midline was closed with staples. 2 quarter-inch Penrose drains were placed into the deeper tissues of the prior mucous fistula sites and secured with staples as well. The loop ileostomy was then matured. An enterotomy was created with electrocautery and full-thickness circumferential sutures were used to secure the mucosa of the small bowel to the skin circumferentially with the rod carefully left in place to form our loop ileostomy. An ostomy appliance was brought to the field and cut to the appropriate size and secured around our new loop ileostomy. The abdomen was cleaned and dry with a wet and dry sterile towel.All of our remaining staple lines were dressed with honeycomb dressings.  The patient tolerated the procedure well and She was awoken from anesthesia and transferred to the PACU in good conditions. There were no immediate complications.  Findings:Extensive adhesions, end ileostomy, 2 colonic mucous fistulas, rectal stump abdominal wall abscess cavity   Estimated Blood Loss: 150 mL         Drains: None         Specimens: bowel          Complications: none                  Condition: good   Ricarda Frame, MD, FACS

## 2017-08-24 NOTE — Anesthesia Preprocedure Evaluation (Signed)
Anesthesia Evaluation  Patient identified by MRN, date of birth, ID band Patient awake    Reviewed: Allergy & Precautions, NPO status , Patient's Chart, lab work & pertinent test results  History of Anesthesia Complications Negative for: history of anesthetic complications  Airway Mallampati: II  TM Distance: >3 FB Neck ROM: Full    Dental  (+) Upper Dentures   Pulmonary neg sleep apnea, neg COPD, former smoker,    breath sounds clear to auscultation- rhonchi (-) wheezing      Cardiovascular Exercise Tolerance: Good (-) CAD, (-) Past MI, (-) Cardiac Stents and (-) CABG  Rhythm:Regular Rate:Normal - Systolic murmurs and - Diastolic murmurs    Neuro/Psych negative neurological ROS  negative psych ROS   GI/Hepatic negative GI ROS, Neg liver ROS,   Endo/Other  negative endocrine ROSneg diabetes  Renal/GU negative Renal ROS     Musculoskeletal  (+) Arthritis , Rheumatoid disorders,    Abdominal (+) - obese,   Peds  Hematology  (+) anemia ,   Anesthesia Other Findings Past Medical History: No date: Anemia No date: Collagen vascular disease (HCC) No date: DVT (deep venous thrombosis) (HCC)     Comment:  left leg 08/2016: Perforation of colon (HCC) 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   Reproductive/Obstetrics                             Anesthesia Physical Anesthesia Plan  ASA: II  Anesthesia Plan: General   Post-op Pain Management:    Induction: Intravenous  PONV Risk Score and Plan: 2 and Ondansetron and Dexamethasone  Airway Management Planned: Oral ETT  Additional Equipment:   Intra-op Plan:   Post-operative Plan: Extubation in OR  Informed Consent: I have reviewed the patients History and Physical, chart, labs and discussed the procedure including the risks, benefits and alternatives for the proposed anesthesia with the patient or authorized representative who has indicated  his/her understanding and acceptance.   Dental advisory given  Plan Discussed with: CRNA and Anesthesiologist  Anesthesia Plan Comments:         Anesthesia Quick Evaluation

## 2017-08-24 NOTE — Anesthesia Postprocedure Evaluation (Signed)
Anesthesia Post Note  Patient: Ruth Gray  Procedure(s) Performed: ILEOSTOMY REVERSAL X3 (N/A ) EXPLORATORY LAPAROTOMY ILEOSTOMY Loop Creation  Patient location during evaluation: PACU Anesthesia Type: General Level of consciousness: awake and alert and oriented Pain management: pain level controlled Vital Signs Assessment: post-procedure vital signs reviewed and stable Respiratory status: spontaneous breathing, nonlabored ventilation and respiratory function stable Cardiovascular status: blood pressure returned to baseline and stable Postop Assessment: no signs of nausea or vomiting Anesthetic complications: no     Last Vitals:  Vitals:   08/24/17 1342 08/24/17 1358  BP: 123/70 128/76  Pulse: 82 88  Resp: 12   Temp: 37.1 C 36.7 C  SpO2: 100% 100%    Last Pain:  Vitals:   08/24/17 1358  TempSrc: Oral  PainSc: Asleep                 Kimeka Badour

## 2017-08-24 NOTE — Brief Op Note (Signed)
08/24/2017  12:32 PM  PATIENT:  Ruth Gray  70 y.o. female  PRE-OPERATIVE DIAGNOSIS:  ILEOSTOMY IN PLACE  POST-OPERATIVE DIAGNOSIS:  ILEOSTOMY IN PLACE  PROCEDURE:  Procedure(s): ILEOSTOMY REVERSAL X3 (N/A) EXPLORATORY LAPAROTOMY ILEOSTOMY Loop Creation  SURGEON:  Surgeon(s) and Role:    * Ricarda Frame, MD - Primary    * Excell Seltzer, Adah Salvage, MD - Assisting  PHYSICIAN ASSISTANT:   ASSISTANTS: none   ANESTHESIA:   general  EBL:  Total I/O In: 1800 [I.V.:1800] Out: 250 [Urine:100; Blood:150]  BLOOD ADMINISTERED:none  DRAINS: none   LOCAL MEDICATIONS USED:  MARCAINE    and XYLOCAINE   SPECIMEN:  Source of Specimen:  bowel at anastamoses  DISPOSITION OF SPECIMEN:  PATHOLOGY  COUNTS:  YES  TOURNIQUET:  * No tourniquets in log *  DICTATION: .Dragon Dictation  PLAN OF CARE: Admit to inpatient   PATIENT DISPOSITION:  PACU - hemodynamically stable.   Delay start of Pharmacological VTE agent (>24hrs) due to surgical blood loss or risk of bleeding: yes

## 2017-08-24 NOTE — Transfer of Care (Signed)
Immediate Anesthesia Transfer of Care Note  Patient: Ruth Gray  Procedure(s) Performed: ILEOSTOMY REVERSAL X3 (N/A ) EXPLORATORY LAPAROTOMY ILEOSTOMY Loop Creation  Patient Location: PACU  Anesthesia Type:General  Level of Consciousness: awake, drowsy and patient cooperative  Airway & Oxygen Therapy: Patient Spontanous Breathing and Patient connected to nasal cannula oxygen  Post-op Assessment: Report given to RN and Post -op Vital signs reviewed and stable  Post vital signs: Reviewed and stable  Last Vitals:  Vitals:   08/24/17 0604 08/24/17 1241  BP: 123/73 122/77  Pulse: 69 86  Resp: 18 10  Temp: 36.5 C 36.6 C  SpO2: 100% 100%    Last Pain:  Vitals:   08/24/17 0604  TempSrc: Tympanic         Complications: No apparent anesthesia complications

## 2017-08-24 NOTE — Anesthesia Post-op Follow-up Note (Signed)
Anesthesia QCDR form completed.        

## 2017-08-24 NOTE — H&P (View-Only) (Signed)
Outpatient Surgical Follow Up  08/17/2017  Ruth Gray is an 71 y.o. female.   Chief Complaint  Patient presents with  . Follow-up    Discuss Ileostomy Reversal    HPI: 71-year-old female who is well-known the surgery service returns to clinic today for repeat exam prior to her scheduled ostomy reversal next week. Patient reports she continues to do well. She is eating well and having normal ostomy output. She denies any fevers, chills, nausea, vomiting, chest pain, shortness of breath. She continues to have an anterior abdominal wound that is healing well. She did however have a left ankle injury and is now in a walking boot. She states she'll be abdomen walking boot prior to surgery next week. She has no other changes since her last visit.  Past Medical History:  Diagnosis Date  . Anemia   . Collagen vascular disease (HCC)   . DVT (deep venous thrombosis) (HCC)    left leg  . Perforation of colon (HCC) 08/2016  . Rheumatoid arteritis     Past Surgical History:  Procedure Laterality Date  . ABDOMINAL HYSTERECTOMY    . CENTRAL VENOUS CATHETER INSERTION  09/21/2016   Procedure: INSERTION CENTRAL LINE ADULT;  Surgeon: Ralph Ely III, MD;  Location: [facility] ORS;  Service: General;;  . COLON RESECTION  09/21/2016   Procedure: COLON RESECTION- Ascending and Sigmoid;  Surgeon: Ralph Ely III, MD;  Location: [facility];  Service: General;;  . COLOSTOMY N/A 09/25/2016   Procedure: COLOSTOMY;  Surgeon: Ralph Ely III, MD;  Location: [facility];  Service: General;  Laterality: N/A;  . DEBRIDEMENT OF ABDOMINAL WALL ABSCESS N/A 12/03/2016   Procedure: DEBRIDEMENT OF ABDOMINAL WALL ABSCESS;  Surgeon: Diego F Pabon, MD;  Location: [facility] OR[facility];  Service: General;  Laterality: N/A;  . FOOT SURGERY Left   . LAPAROTOMY N/A 09/21/2016   Procedure: EXPLORATORY LAPAROTOMY;  Surgeon: Ralph Ely III, MD;  Location: [facility];  Service: General;  Laterality: N/A;  . LAPAROTOMY N/A 09/25/2016   Procedure: EXPLORATORY  LAPAROTOMY and right colon resection;  Surgeon: Ralph Ely III, MD;  Location: [facility] OR[facility];  Service: General;  Laterality: N/A;  . REPLACEMENT TOTAL KNEE Left     Family History  Problem Relation Age of Onset  . Breast cancer Other 36  . Prostate cancer Father   . Ovarian cancer Sister   . Kidney cancer Brother   . Lung cancer Sister     Social History:  reports that she quit smoking about 13 months ago. Her smoking use included Cigarettes. She smoked 0.50 packs per day. She has never used smokeless tobacco. She reports that she does not drink alcohol or use drugs.  Allergies:  Allergies  Allergen Reactions  . Ciprofloxacin Swelling  . Penicillins Swelling and Rash    Has patient had a PCN reaction causing immediate rash, facial/tongue/throat swelling, SOB or lightheadedness with hypotension: yes Has patient had a PCN reaction causing severe rash involving mucus membranes or skin necrosis: no Has patient had a PCN reaction that required hospitalization no Has patient had a PCN reaction occurring within the last 10 years: no If all of the above answers are "NO", then may proceed with Cephalosporin use.   . Tramadol Nausea And Vomiting    Medications reviewed.    ROS A multipoint review of systems was completed, all pertinent positives and negatives are documented within the history of present illness and remainder are negative   BP 126/79   Pulse 74     Temp 98.3 F (36.8 C) (Oral)   Ht 5\' 7"  (1.702 m)   Wt 74 kg (163 lb 3.2 oz)   BMI 25.56 kg/m   Physical Exam Gen.: No acute distress Neck: Supple and nontender Chest: Clear to auscultation Heart: Regular rate and rhythm Abdomen: Soft, nontender, nondistended. In ileostomy present in the right lower quadrant that is pink, patent, productive of stool. 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 Small infraumbilical wound with healthy-appearing granulation  tissue. Extremities: Moves all extremities well. Left foot in a walking boot.    No results found for this or any previous visit (from the past 48 hour(s)). Ct Chest Lung Cancer Screening Low Dose Wo Contrast  Result Date: 08/17/2017 CLINICAL DATA:  71 year old female former smoker (quit in September 2017) with 31 pack-year history of smoking. Lung cancer screening examination. EXAM: CT CHEST WITHOUT CONTRAST LOW-DOSE FOR LUNG CANCER SCREENING TECHNIQUE: Multidetector CT imaging of the chest was performed following the standard protocol without IV contrast. COMPARISON:  Low-dose lung cancer screening chest CT 08/09/2016. FINDINGS: Cardiovascular: Heart size is normal. There is no significant pericardial fluid, thickening or pericardial calcification. There is aortic atherosclerosis, as well as atherosclerosis of the great vessels of the mediastinum and the coronary arteries, including calcified atherosclerotic plaque in the left main, left anterior descending, left circumflex and right coronary arteries. Mediastinum/Nodes: No pathologically enlarged mediastinal or hilar lymph nodes. Please note that accurate exclusion of hilar adenopathy is limited on noncontrast CT scans. Esophagus is unremarkable in appearance. No axillary lymphadenopathy. Lungs/Pleura: Several small pulmonary nodules are again noted throughout the lungs bilaterally, similar in size, number and distribution to the prior study. The largest of these is in the lateral aspect of the left lower lobe (axial image 196 of series 3) with a volume derived mean diameter of only 4.0 mm. No larger more suspicious appearing pulmonary nodules or masses are noted. No acute consolidative airspace disease. No pleural effusions. Mild diffuse bronchial wall thickening with mild centrilobular and paraseptal emphysema. Upper Abdomen: Aortic atherosclerosis. Colonic diverticulosis noted in the region of the splenic flexure of the colon. Musculoskeletal: There  are no aggressive appearing lytic or blastic lesions noted in the visualized portions of the skeleton. IMPRESSION: 1. Lung-RADS 2S, benign appearance or behavior. Continue annual screening with low-dose chest CT without contrast in 12 months. 2. The "S" modifier above refers to potentially clinically significant non lung cancer related findings. Specifically, there is aortic atherosclerosis, in addition to left main and 3 vessel coronary artery disease. Please note that although the presence of coronary artery calcium documents the presence of coronary artery disease, the severity of this disease and any potential stenosis cannot be assessed on this non-gated CT examination. Assessment for potential risk factor modification, dietary therapy or pharmacologic therapy may be warranted, if clinically indicated. 3. Mild diffuse bronchial wall thickening with mild centrilobular and paraseptal emphysema; imaging findings suggestive of underlying COPD. 4. Colonic diverticulosis. Aortic Atherosclerosis (ICD10-I70.0) and Emphysema (ICD10-J43.9). Electronically Signed   By: 08/11/2016 M.D.   On: 08/17/2017 16:47    Assessment/Plan:  1. Ileostomy in place [facility]) 71 year old female with, good surgical history returns to clinic today to discuss her ostomy reversal. Again discussed the surgical plan in detail. The plan is to reconnect her mucous fistulas to her end ileostomy and a restore bowel continuity. Discussed that given the multiple anastomoses that are anticipated to be made that a loop ileostomy was made proximal  to this. Described the risks, benefits, alternatives. Patient voiced understanding and desires to proceed. Plan as originally planned to proceed to the operating room next Wednesday, October 3. She understands that she will likely be in the hospital 7-10 days afterwards. All questions were answered the patient's satisfaction.  A total of 15 minutes was used on this encounter and greater than 50% of  it used for counseling her coordination of care.   Ricarda Frame, MD FACS General Surgeon  08/17/2017,5:05 PM

## 2017-08-24 NOTE — Interval H&P Note (Signed)
History and Physical Interval Note:  08/24/2017 7:11 AM  Ruth Gray  has presented today for surgery, with the diagnosis of ILEOSTOMY IN PLACE  The various methods of treatment have been discussed with the patient and family. After consideration of risks, benefits and other options for treatment, the patient has consented to  Procedure(s): ILEOSTOMY REVERSAL X3 (N/A) as a surgical intervention .  The patient's history has been reviewed, patient examined, no change in status, stable for surgery.  I have reviewed the patient's chart and labs.  Questions were answered to the patient's satisfaction.     Ricarda Frame

## 2017-08-24 NOTE — Anesthesia Procedure Notes (Signed)
Procedure Name: Intubation Date/Time: 08/24/2017 7:45 AM Performed by: Silvana Newness Pre-anesthesia Checklist: Patient identified, Emergency Drugs available, Suction available, Patient being monitored and Timeout performed Patient Re-evaluated:Patient Re-evaluated prior to induction Oxygen Delivery Method: Circle system utilized Preoxygenation: Pre-oxygenation with 100% oxygen Induction Type: IV induction Ventilation: Mask ventilation without difficulty Laryngoscope Size: Mac and 3 Grade View: Grade I Tube type: Oral Tube size: 7.0 mm Number of attempts: 1 Airway Equipment and Method: Stylet Placement Confirmation: ETT inserted through vocal cords under direct vision,  positive ETCO2 and breath sounds checked- equal and bilateral Secured at: 20 cm Tube secured with: Tape Dental Injury: Teeth and Oropharynx as per pre-operative assessment

## 2017-08-25 ENCOUNTER — Encounter: Payer: Self-pay | Admitting: General Surgery

## 2017-08-25 LAB — CBC
HCT: 30.6 % — ABNORMAL LOW (ref 35.0–47.0)
HEMOGLOBIN: 10.5 g/dL — AB (ref 12.0–16.0)
MCH: 32 pg (ref 26.0–34.0)
MCHC: 34.2 g/dL (ref 32.0–36.0)
MCV: 93.4 fL (ref 80.0–100.0)
Platelets: 237 10*3/uL (ref 150–440)
RBC: 3.27 MIL/uL — AB (ref 3.80–5.20)
RDW: 14.9 % — ABNORMAL HIGH (ref 11.5–14.5)
WBC: 8.1 10*3/uL (ref 3.6–11.0)

## 2017-08-25 LAB — BASIC METABOLIC PANEL
ANION GAP: 6 (ref 5–15)
BUN: 20 mg/dL (ref 6–20)
CALCIUM: 8.1 mg/dL — AB (ref 8.9–10.3)
CO2: 24 mmol/L (ref 22–32)
Chloride: 104 mmol/L (ref 101–111)
Creatinine, Ser: 1.06 mg/dL — ABNORMAL HIGH (ref 0.44–1.00)
GFR, EST NON AFRICAN AMERICAN: 52 mL/min — AB (ref >60)
GLUCOSE: 158 mg/dL — AB (ref 65–99)
Potassium: 4.5 mmol/L (ref 3.5–5.1)
SODIUM: 134 mmol/L — AB (ref 135–145)

## 2017-08-25 LAB — SURGICAL PATHOLOGY

## 2017-08-25 MED ORDER — KETOROLAC TROMETHAMINE 15 MG/ML IJ SOLN
15.0000 mg | Freq: Four times a day (QID) | INTRAMUSCULAR | Status: AC
  Administered 2017-08-25 – 2017-08-26 (×4): 15 mg via INTRAVENOUS
  Filled 2017-08-25 (×5): qty 1

## 2017-08-25 MED ORDER — MORPHINE SULFATE (PF) 2 MG/ML IV SOLN
2.0000 mg | INTRAVENOUS | Status: DC | PRN
  Administered 2017-08-26 – 2017-08-29 (×11): 2 mg via INTRAVENOUS
  Filled 2017-08-25 (×11): qty 1

## 2017-08-25 MED ORDER — ACETAMINOPHEN 10 MG/ML IV SOLN
1000.0000 mg | Freq: Four times a day (QID) | INTRAVENOUS | Status: AC | PRN
  Administered 2017-08-25: 1000 mg via INTRAVENOUS
  Filled 2017-08-25 (×2): qty 100

## 2017-08-25 NOTE — Progress Notes (Signed)
Primary nurse spoke to Dr. Earlene Plater in regard to Pt Vitals. Increased HR 120; BP low. Orders parameters placed on IV metoprolol; Toradol IV ordered for pain. Primary nurse to continue to monitor.

## 2017-08-25 NOTE — Progress Notes (Signed)
Patient is postop ileostomy reversal and ileostomy loop creation. Patient has no acute event overnight. VSS, foley in place, and pain managed with PRN pain med. Patient surgical site in [facility] without any complication.

## 2017-08-25 NOTE — Consult Note (Signed)
WOC Nurse ostomy consult note Stoma type/location: RLQ ileostomy with plastic retention rod in place.   Stomal assessment/size: Visualized through pouch.  1 1/2" pink and moist.  Brown stool in pouch.   Peristomal assessment: not assessed Treatment options for stomal/peristomal skin: Will switch to 2 3/4" pouch tomorrow with barrier ring to accommodate retention rod.  Output soft brown stool Ostomy pouching: 2pc. Pouch in place.  Education provided: Patient is groggy and in pain.  Had ostomy before.  Will perform pouch change tomorrow.   Enrolled patient in [employer] DC program: Yes WOC team will follow and remain available to patient, medical and nursing teams.  Maple Hudson RN BSN CWON Pager [phone]

## 2017-08-25 NOTE — Progress Notes (Signed)
1 Day Post-Op   Subjective:  Patient is one day status post laparotomy with multiple ostomy revisions. Doing well. States that her pain is somewhat controlled currently. States she is not hungry but is thirsty. Has not been out of bed yet.  Vital signs in last 24 hours: Temp:  [97.9 F (36.6 C)-100.9 F (38.3 C)] 99.3 F (37.4 C) (10/04 0542) Pulse Rate:  [81-129] 101 (10/04 0605) Resp:  [9-20] 17 (10/04 0432) BP: (97-131)/(54-77) 97/58 (10/04 0605) SpO2:  [95 %-100 %] 97 % (10/04 0605) Weight:  [72.1 kg (159 lb)] 72.1 kg (159 lb) (10/03 1358) Last BM Date: 08/24/17  Intake/Output from previous day: 10/03 0701 - 10/04 0700 In: 4235 [I.V.:3725; NG/GT:10; IV Piggyback:100] Out: 840 [Urine:690; Blood:150]  GI: Abdomen is soft, appropriately tender to palpation, nondistended. Honeycomb dressings in place with some minimal drainage to the right lateral dressing. Ileostomy in the right lower quadrant is patent currently and with stool in it  Lab Results:  CBC  Recent Labs  08/24/17 1427 08/25/17 0358  WBC 4.1 8.1  HGB 10.1* 10.5*  HCT 29.9* 30.6*  PLT 205 237   CMP     Component Value Date/Time   NA 134 (L) 08/25/2017 0358   K 4.5 08/25/2017 0358   CL 104 08/25/2017 0358   CO2 24 08/25/2017 0358   GLUCOSE 158 (H) 08/25/2017 0358   BUN 20 08/25/2017 0358   CREATININE 1.06 (H) 08/25/2017 0358   CALCIUM 8.1 (L) 08/25/2017 0358   PROT 7.9 12/02/2016 1315   ALBUMIN 2.6 (L) 12/02/2016 1315   AST 30 12/02/2016 1315   ALT 10 (L) 12/02/2016 1315   ALKPHOS 74 12/02/2016 1315   BILITOT 0.9 12/02/2016 1315   GFRNONAA 52 (L) 08/25/2017 0358   GFRAA >60 08/25/2017 0358   PT/INR No results for input(s): LABPROT, INR in the last 72 hours.  Studies/Results: No results found.  Assessment/Plan: 71 year old female status post export her laparotomy with extensive abdominal surgery. Discussed that the goal for today her out of bed to chair. Anticipate ileus but will allow for ice  chips today. Continue NG tube for now. Discussed with patient that if she is able to get out of bed and move around with ease than her Foley can be removed otherwise continue her Foley catheter for 1 additional day. Plan to add IV Tylenol for additional pain relief and as needed fever treatment. Plan to discontinue IV antibiotics today.   Ruth Frame, MD [facility] General Surgeon [facility]  Day ASCOM [phone] Night ASCOM [phone]  08/25/2017

## 2017-08-25 NOTE — Progress Notes (Signed)
Patient up to chair this evening. Tolerating ice chips with no complaints of nausea or emesis. Incentive spirometer used during care times with encouragement. Pain managed with morphine.

## 2017-08-26 MED ORDER — METOPROLOL TARTRATE 25 MG PO TABS
12.5000 mg | ORAL_TABLET | Freq: Two times a day (BID) | ORAL | Status: DC
  Administered 2017-08-26 – 2017-08-31 (×10): 12.5 mg via ORAL
  Filled 2017-08-26 (×10): qty 1

## 2017-08-26 MED ORDER — METOPROLOL TARTRATE 5 MG/5ML IV SOLN: 5.0000 mg | Freq: Four times a day (QID) | INTRAVENOUS | Status: DC | PRN

## 2017-08-26 NOTE — Progress Notes (Signed)
Primary nurse spoke to Dr. Valaria Good in regard to pt HR. 133. Orders received to resume pt home Metoprolol. 12.5 mg BID. Primary nurse to continue to monitor.

## 2017-08-26 NOTE — Progress Notes (Signed)
Patient has gotten up to the BR  X 2 this shift.  No difficulty voided post foley catheter.  She has bloody drainage from her rectum when up.

## 2017-08-26 NOTE — Consult Note (Signed)
WOC Nurse ostomy follow up Stoma type/location: RLQ ileostomy with plastic retention rod in place.   Stomal assessment/size:  1 1/2" pink and moist. Slightly oval Brown stool in pouch.   Peristomal assessment: intact  Midline incision present Treatment options for stomal/peristomal skin: Barrier ring and 2 1/4" two piece system.  Output soft brown stool Ostomy pouching: 2pc. Pouch change today. Patient observed and was able to open and roll closed and simulate emptying. Discussed differences between ileostomy and colostomy Education provided: feels confident about pouch change.  Enrolled patient in [employer] DC program: Yes WOC team will follow and remain available to patient, medical and nursing teams.  Maple Hudson RN BSN CWON Pager [phone]

## 2017-08-26 NOTE — Progress Notes (Signed)
2 Days Post-Op   Subjective:  Patient reports she was able to get out of bed to chair yesterday. Still has significant pain to her abdomen. Denies any nausea. Continues to have some output from her ileostomy. States she would like something to drink. Had some low blood pressures overnight without other symptoms.  Vital signs in last 24 hours: Temp:  [97.7 F (36.5 C)-100.2 F (37.9 C)] 98.2 F (36.8 C) (10/05 0510) Pulse Rate:  [87-135] 87 (10/05 0602) Resp:  [16-20] 16 (10/05 0510) BP: (87-114)/(41-94) 91/41 (10/05 0602) SpO2:  [95 %-100 %] 100 % (10/05 0510) Last BM Date: 08/25/17  Intake/Output from previous day: 10/04 0701 - 10/05 0700 In: 3260.4 [I.V.:3260.4] Out: 1000 [Urine:900; Emesis/NG output:100]  Gen.: No acute distress Chest: Clear to auscultation Heart: Regular rate and rhythm GI: Abdomen is soft, appropriately tender to palpation, nondistended. All staple lines well approximated without any evidence of spreading erythema or purulent drainage. Appropriate serous and was drainage from the Penrose drains in the bilateral upper quadrant incisions. Right lower quadrant ileostomy is pink, patent, productive of stool.  Lab Results:  CBC  Recent Labs  08/24/17 1427 08/25/17 0358  WBC 4.1 8.1  HGB 10.1* 10.5*  HCT 29.9* 30.6*  PLT 205 237   CMP     Component Value Date/Time   NA 134 (L) 08/25/2017 0358   K 4.5 08/25/2017 0358   CL 104 08/25/2017 0358   CO2 24 08/25/2017 0358   GLUCOSE 158 (H) 08/25/2017 0358   BUN 20 08/25/2017 0358   CREATININE 1.06 (H) 08/25/2017 0358   CALCIUM 8.1 (L) 08/25/2017 0358   PROT 7.9 12/02/2016 1315   ALBUMIN 2.6 (L) 12/02/2016 1315   AST 30 12/02/2016 1315   ALT 10 (L) 12/02/2016 1315   ALKPHOS 74 12/02/2016 1315   BILITOT 0.9 12/02/2016 1315   GFRNONAA 52 (L) 08/25/2017 0358   GFRAA >60 08/25/2017 0358   PT/INR No results for input(s): LABPROT, INR in the last 72 hours.  Studies/Results: No results  found.  Assessment/Plan: 71 year old female 2 days status post export her laparotomy with multiple abdominal anastomoses. Doing well. Discussed with her today we'll remove her NG tube due to minimal output. We would also remove her Foley and encourage her to ambulate and use her incentive spirometer. She is to be seen by the wound care nurse later today we'll change her ostomy appliance. Discussed with nurse about changing her metoprolol to as needed. Her dressing changes can also be now done daily or as needed for saturation.   Ricarda Frame, MD [facility] General Surgeon [facility]  Day ASCOM [phone] Night ASCOM [phone]  08/26/2017

## 2017-08-26 NOTE — Progress Notes (Signed)
Met patient today and examined her abdomen. Discussed with Dr. Adonis Huguenin. I'll covering this weekend and an well aware of her situation and medical conditions. We'll continue to observe at this point. Awaiting bowel function.

## 2017-08-27 LAB — CBC
HEMATOCRIT: 26.3 % — AB (ref 35.0–47.0)
Hemoglobin: 8.9 g/dL — ABNORMAL LOW (ref 12.0–16.0)
MCH: 31.7 pg (ref 26.0–34.0)
MCHC: 34 g/dL (ref 32.0–36.0)
MCV: 93.4 fL (ref 80.0–100.0)
PLATELETS: 268 10*3/uL (ref 150–440)
RBC: 2.81 MIL/uL — ABNORMAL LOW (ref 3.80–5.20)
RDW: 15 % — AB (ref 11.5–14.5)
WBC: 7.7 10*3/uL (ref 3.6–11.0)

## 2017-08-27 LAB — COMPREHENSIVE METABOLIC PANEL
ALBUMIN: 2.1 g/dL — AB (ref 3.5–5.0)
ALT: 10 U/L — ABNORMAL LOW (ref 14–54)
ANION GAP: 4 — AB (ref 5–15)
AST: 18 U/L (ref 15–41)
Alkaline Phosphatase: 54 U/L (ref 38–126)
BILIRUBIN TOTAL: 0.9 mg/dL (ref 0.3–1.2)
BUN: 14 mg/dL (ref 6–20)
CALCIUM: 8.3 mg/dL — AB (ref 8.9–10.3)
CHLORIDE: 107 mmol/L (ref 101–111)
CO2: 27 mmol/L (ref 22–32)
Creatinine, Ser: 1.17 mg/dL — ABNORMAL HIGH (ref 0.44–1.00)
GFR calc Af Amer: 53 mL/min — ABNORMAL LOW (ref >60)
GFR, EST NON AFRICAN AMERICAN: 46 mL/min — AB (ref >60)
Glucose, Bld: 142 mg/dL — ABNORMAL HIGH (ref 65–99)
POTASSIUM: 3.5 mmol/L (ref 3.5–5.1)
Sodium: 138 mmol/L (ref 135–145)
Total Protein: 5.8 g/dL — ABNORMAL LOW (ref 6.5–8.1)

## 2017-08-27 NOTE — Progress Notes (Signed)
3 Days Post-Op  Subjective: Patient feels well at this time. She was having some nausea earlier. She has experienced some tachycardia.  Objective: Vital signs in last 24 hours: Temp:  [98.2 F (36.8 C)-99 F (37.2 C)] 98.3 F (36.8 C) (10/06 1022) Pulse Rate:  [102-133] 105 (10/06 1022) Resp:  [16] 16 (10/06 0404) BP: (105-117)/(59-70) 117/70 (10/06 1022) SpO2:  [95 %-97 %] 97 % (10/06 1022) Last BM Date: 08/26/17  Intake/Output from previous day: 10/05 0701 - 10/06 0700 In: 3202.5 [P.O.:240; I.V.:2962.5] Out: 125 [Stool:125] Intake/Output this shift: Total I/O In: 305 [I.V.:305] Out: -   Physical exam:  Vital signs are reviewed. Afebrile. Patient remains less tachycardic at 105 blood pressure is normal.  Appears comfortable  Abdomen soft nondistended nontympanitic and minimally tender around the incision.  Nontender calves  Lab Results: CBC   Recent Labs  08/25/17 0358 08/27/17 0513  WBC 8.1 7.7  HGB 10.5* 8.9*  HCT 30.6* 26.3*  PLT 237 268   BMET  Recent Labs  08/25/17 0358 08/27/17 0513  NA 134* 138  K 4.5 3.5  CL 104 107  CO2 24 27  GLUCOSE 158* 142*  BUN 20 14  CREATININE 1.06* 1.17*  CALCIUM 8.1* 8.3*   PT/INR No results for input(s): LABPROT, INR in the last 72 hours. ABG No results for input(s): PHART, HCO3 in the last 72 hours.  Invalid input(s): PCO2, PO2  Studies/Results: No results found.  Anti-infectives: Anti-infectives    Start     Dose/Rate Route Frequency Ordered Stop   08/25/17 0800  gentamicin (GARAMYCIN) 370 mg in dextrose 5 % 100 mL IVPB  Status:  Discontinued     5 mg/kg  74 kg 109.3 mL/hr over 60 Minutes Intravenous Every 24 hours 08/24/17 1407 08/25/17 0900   08/24/17 1600  clindamycin (CLEOCIN) IVPB 900 mg  Status:  Discontinued     900 mg 100 mL/hr over 30 Minutes Intravenous Every 8 hours 08/24/17 1407 08/25/17 0900   08/24/17 0554  clindamycin (CLEOCIN) 900 MG/50ML IVPB    Comments:  Slemenda, Debra   :  cabinet override      08/24/17 0554 08/24/17 0729   08/23/17 2327  clindamycin (CLEOCIN) IVPB 900 mg     900 mg 100 mL/hr over 30 Minutes Intravenous 60 min pre-op 08/23/17 2327 08/24/17 0759   08/23/17 2327  gentamicin (GARAMYCIN) 360 mg in dextrose 5 % 100 mL IVPB     5 mg/kg  72.1 kg 109 mL/hr over 60 Minutes Intravenous 60 min pre-op 08/23/17 2327 08/24/17 0850      Assessment/Plan: s/p Procedure(s): ILEOSTOMY REVERSAL X3 EXPLORATORY LAPAROTOMY ILEOSTOMY Loop Creation   Patient is experiencing some tachycardia and nausea or tachycardias better as is her nausea. I would be hasn't to increase her diet at this point and will observe only.  [name], MD, FACS  08/27/2017

## 2017-08-28 LAB — BASIC METABOLIC PANEL
ANION GAP: 4 — AB (ref 5–15)
BUN: 14 mg/dL (ref 6–20)
CALCIUM: 8.2 mg/dL — AB (ref 8.9–10.3)
CO2: 28 mmol/L (ref 22–32)
Chloride: 105 mmol/L (ref 101–111)
Creatinine, Ser: 1.07 mg/dL — ABNORMAL HIGH (ref 0.44–1.00)
GFR calc Af Amer: 60 mL/min — ABNORMAL LOW (ref >60)
GFR, EST NON AFRICAN AMERICAN: 51 mL/min — AB (ref >60)
GLUCOSE: 126 mg/dL — AB (ref 65–99)
Potassium: 3.6 mmol/L (ref 3.5–5.1)
SODIUM: 137 mmol/L (ref 135–145)

## 2017-08-28 LAB — CBC WITH DIFFERENTIAL/PLATELET
BASOS ABS: 0 10*3/uL (ref 0–0.1)
Basophils Relative: 0 %
EOS ABS: 0.1 10*3/uL (ref 0–0.7)
EOS PCT: 2 %
HCT: 24 % — ABNORMAL LOW (ref 35.0–47.0)
Hemoglobin: 8.1 g/dL — ABNORMAL LOW (ref 12.0–16.0)
LYMPHS PCT: 9 %
Lymphs Abs: 0.6 10*3/uL — ABNORMAL LOW (ref 1.0–3.6)
MCH: 31.2 pg (ref 26.0–34.0)
MCHC: 33.6 g/dL (ref 32.0–36.0)
MCV: 92.9 fL (ref 80.0–100.0)
MONO ABS: 0.8 10*3/uL (ref 0.2–0.9)
Monocytes Relative: 13 %
Neutro Abs: 4.9 10*3/uL (ref 1.4–6.5)
Neutrophils Relative %: 76 %
PLATELETS: 286 10*3/uL (ref 150–440)
RBC: 2.59 MIL/uL — AB (ref 3.80–5.20)
RDW: 15 % — AB (ref 11.5–14.5)
WBC: 6.4 10*3/uL (ref 3.6–11.0)

## 2017-08-28 NOTE — Progress Notes (Signed)
4 Days Post-Op  Subjective: Status post colostomy closure with protecting ileostomy. Patient is feeling better today slightly better every day. She has no nausea vomiting. She thinks she had a temperature last night which is confirmed on vital signs.  Objective: Vital signs in last 24 hours: Temp:  [98.3 F (36.8 C)-100.2 F (37.9 C)] 99.2 F (37.3 C) (10/07 0445) Pulse Rate:  [96-105] 96 (10/07 0445) Resp:  [18-19] 18 (10/07 0445) BP: (117-137)/(64-73) 127/64 (10/07 0445) SpO2:  [95 %-98 %] 95 % (10/07 0445) Last BM Date: 08/26/17  Intake/Output from previous day: 10/06 0701 - 10/07 0700 In: 3295.6 [P.O.:240; I.V.:3055.6] Out: 1000 [Urine:900; Stool:100] Intake/Output this shift: No intake/output data recorded.  Physical exam:  Low-grade fever. Abdomen is soft nondistended nontympanitic and essentially nontender except around incisions. Penrose drains are in place. Some ileostomy output. There is some seropurulent drainage from the midline incision which is quite minimal without surrounding erythema. Penrose drain is in place. Calves are nontender  Lab Results: CBC   Recent Labs  08/27/17 0513 08/28/17 0427  WBC 7.7 6.4  HGB 8.9* 8.1*  HCT 26.3* 24.0*  PLT 268 286   BMET  Recent Labs  08/27/17 0513 08/28/17 0427  NA 138 137  K 3.5 3.6  CL 107 105  CO2 27 28  GLUCOSE 142* 126*  BUN 14 14  CREATININE 1.17* 1.07*  CALCIUM 8.3* 8.2*   PT/INR No results for input(s): LABPROT, INR in the last 72 hours. ABG No results for input(s): PHART, HCO3 in the last 72 hours.  Invalid input(s): PCO2, PO2  Studies/Results: No results found.  Anti-infectives: Anti-infectives    Start     Dose/Rate Route Frequency Ordered Stop   08/25/17 0800  gentamicin (GARAMYCIN) 370 mg in dextrose 5 % 100 mL IVPB  Status:  Discontinued     5 mg/kg  74 kg 109.3 mL/hr over 60 Minutes Intravenous Every 24 hours 08/24/17 1407 08/25/17 0900   08/24/17 1600  clindamycin (CLEOCIN) IVPB  900 mg  Status:  Discontinued     900 mg 100 mL/hr over 30 Minutes Intravenous Every 8 hours 08/24/17 1407 08/25/17 0900   08/24/17 0554  clindamycin (CLEOCIN) 900 MG/50ML IVPB    Comments:  Slemenda, Debra   : cabinet override      08/24/17 0554 08/24/17 0729   08/23/17 2327  clindamycin (CLEOCIN) IVPB 900 mg     900 mg 100 mL/hr over 30 Minutes Intravenous 60 min pre-op 08/23/17 2327 08/24/17 0759   08/23/17 2327  gentamicin (GARAMYCIN) 360 mg in dextrose 5 % 100 mL IVPB     5 mg/kg  72.1 kg 109 mL/hr over 60 Minutes Intravenous 60 min pre-op 08/23/17 2327 08/24/17 0850      Assessment/Plan: s/p Procedure(s): ILEOSTOMY REVERSAL X3 EXPLORATORY LAPAROTOMY ILEOSTOMY Loop Creation   White blood cell count is normal Low-grade fever last night so expect that this is likely secondary to either atelectasis or the wound itself. I would not remove staples just yet as the amount of drainage is minimal and it is nontender. Additionally there is a Penrose drain in place draining fluid from either end of the incision. We'll continue to observe.  [name], MD, FACS  08/28/2017

## 2017-08-28 NOTE — Progress Notes (Signed)
Dressing changed to midline and transverse incisions. Both transverse insicions are clean, approximated with staples and penrose drains in place. Scant amount of serous drainage to both. Midline insicion  Has moderate amount of serous  Drainage. Incision has staples but insicion line is separating, at the distal end. Will continue to monitor pt.

## 2017-08-29 ENCOUNTER — Telehealth: Payer: Self-pay | Admitting: General Practice

## 2017-08-29 MED ORDER — HYDROCODONE-ACETAMINOPHEN 5-325 MG PO TABS
1.0000 | ORAL_TABLET | Freq: Four times a day (QID) | ORAL | Status: DC | PRN
  Administered 2017-08-29 (×3): 1 via ORAL
  Administered 2017-08-30: 2 via ORAL
  Administered 2017-08-30: 1 via ORAL
  Administered 2017-08-31: 2 via ORAL
  Filled 2017-08-29: qty 2
  Filled 2017-08-29: qty 1
  Filled 2017-08-29: qty 2
  Filled 2017-08-29 (×3): qty 1

## 2017-08-29 MED ORDER — APIXABAN 5 MG PO TABS
5.0000 mg | ORAL_TABLET | Freq: Two times a day (BID) | ORAL | Status: DC
  Administered 2017-08-29 – 2017-08-31 (×5): 5 mg via ORAL
  Filled 2017-08-29 (×5): qty 1

## 2017-08-29 NOTE — Consult Note (Signed)
WOC Nurse wound follow up Wound type: Dehiscence to midline abdominal wound.  3 staples have dehisced Measurement: 3 cm x 2 cm x 3 cm  Wound bed: ruddy red, nongraulating Drainage (amount, consistency, odor) Moderate serosanguinous  No odor Periwound: Intact staple line and ileostomy Dressing procedure/placement/frequency: Cleanse wound to midline abdomen with NS.  Gently fill with calcium alginate dressing for absorption.  Cover with ABD pad and tape.  Change twice daily.   Will not follow at this time.  Please re-consult if needed.  Maple Hudson RN BSN CWON Pager [phone] WOC Nurse ostomy follow up Stoma type/location: RLQ ileostomy with plastic retention rod in place Stomal assessment/size:  1 1/2" pink and moist. Slightly oval today Peristomal assessment: intact.  Using barrier ring Treatment options for stomal/peristomal skin: barrier ring and 2 1/4" two piece system Output liquid green effluent Ostomy pouching: 2pc. 2 1/4" pouch with barrier ring  Education provided: Pouch change today.  Son at bedside and understands difference in colostomy and ileostomy.  He assists his mother with care.  Pouches left at bedside for discharge.  Enrolled patient in [place] Secure Start Discharge program: yes Will not follow at this time.  Please re-consult if needed.  Maple Hudson RN BSN CWON Pager [phone]

## 2017-08-29 NOTE — Telephone Encounter (Signed)
Patients son has fmla paperwork he needs filled out for work. Paperwork has been placed in the folder up front, and has been paid, son will come by and pick up paperwork. Please call patient's son when paperwork is ready.

## 2017-08-29 NOTE — Progress Notes (Signed)
5 Days Post-Op   Subjective:  Patient reports that she had a good night. Pain currently well controlled and she has been ambulating. States she would like something other than broth. Nurse reports that her oral intake has not been adequate. Worried about midline incision.  Vital signs in last 24 hours: Temp:  [98.3 F (36.8 C)-99.6 F (37.6 C)] 98.3 F (36.8 C) (10/08 0535) Pulse Rate:  [87-105] 87 (10/08 0535) Resp:  [12-18] 18 (10/08 0535) BP: (128-148)/(65-67) 137/66 (10/08 0535) SpO2:  [97 %-100 %] 100 % (10/08 0535) Last BM Date: 08/28/17  Intake/Output from previous day: 10/07 0701 - 10/08 0700 In: -  Out: 725 [Urine:600; Stool:125]  GI: Abdomen is soft, appropriately TTP at her incision sites, bilateral upper quadrant incisions well approximated over penrose drains. Loop ileostomy present in RLQ that is pink, patent and productive of stool. Midline wound with approximate 4cm area below the umbilicus that has separated. The deeper fascia closure is intact.  Lab Results:  CBC  Recent Labs  08/27/17 0513 08/28/17 0427  WBC 7.7 6.4  HGB 8.9* 8.1*  HCT 26.3* 24.0*  PLT 268 286   CMP     Component Value Date/Time   NA 137 08/28/2017 0427   K 3.6 08/28/2017 0427   CL 105 08/28/2017 0427   CO2 28 08/28/2017 0427   GLUCOSE 126 (H) 08/28/2017 0427   BUN 14 08/28/2017 0427   CREATININE 1.07 (H) 08/28/2017 0427   CALCIUM 8.2 (L) 08/28/2017 0427   PROT 5.8 (L) 08/27/2017 0513   ALBUMIN 2.1 (L) 08/27/2017 0513   AST 18 08/27/2017 0513   ALT 10 (L) 08/27/2017 0513   ALKPHOS 54 08/27/2017 0513   BILITOT 0.9 08/27/2017 0513   GFRNONAA 51 (L) 08/28/2017 0427   GFRAA 60 (L) 08/28/2017 0427   PT/INR No results for input(s): LABPROT, INR in the last 72 hours.  Studies/Results: No results found.  Assessment/Plan: 71 year old female POD #5 from extensive laparotomy with ostomy reversal and loop ileostomy creation. 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 Packed today and wound care team asked to evaluate for wound care recommendation. Otherwise doing well. Plan to half her IV fluid rate today, increase her diet to full liquids and restart her oral anticoagulation.   Encourage ambulation and incentive spirometer usage. Possible discharge home in 48 hours if her oral intake and activity level continues to improve at current rate.   Ricarda Frame, MD [facility] General Surgeon [facility]  Day ASCOM [phone] Night ASCOM [phone]  08/29/2017

## 2017-08-30 ENCOUNTER — Telehealth: Payer: Self-pay

## 2017-08-30 MED ORDER — SULFAMETHOXAZOLE-TRIMETHOPRIM 800-160 MG PO TABS
1.0000 | ORAL_TABLET | Freq: Two times a day (BID) | ORAL | Status: DC
  Administered 2017-08-30 – 2017-08-31 (×3): 1 via ORAL
  Filled 2017-08-30 (×4): qty 1

## 2017-08-30 NOTE — Progress Notes (Signed)
6 Days Post-Op   Subjective:  Patient reports that she had a good night. Her pain is well-controlled and she's been tolerating liquids. Discussed that she would like something a little bit more solid to eat today. Continues to have open wound to her midline now with some drainage from her right upper quadrant  Vital signs in last 24 hours: Temp:  [98.2 F (36.8 C)-98.3 F (36.8 C)] 98.3 F (36.8 C) (10/09 0446) Pulse Rate:  [83-85] 85 (10/09 0446) Resp:  [20] 20 (10/09 0446) BP: (112-131)/(61-72) 131/72 (10/09 0446) SpO2:  [96 %-99 %] 99 % (10/09 0446) Last BM Date: 08/29/17  Intake/Output from previous day: 10/08 0701 - 10/09 0700 In: 1270 [P.O.:360; I.V.:910] Out: 2000 [Urine:1600; Stool:400]  GI: Abdomen is soft, appropriately tender to palpation, nondistended. Wound disruption just below the umbilicus packed with packing for wound care referral. 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 Left upper quadrant without any drainage. Loop ileostomy in the right lower quadrant that is pink, patent, productive of bilious liquid  Lab Results:  CBC  Recent Labs  08/28/17 0427  WBC 6.4  HGB 8.1*  HCT 24.0*  PLT 286   CMP     Component Value Date/Time   NA 137 08/28/2017 0427   K 3.6 08/28/2017 0427   CL 105 08/28/2017 0427   CO2 28 08/28/2017 0427   GLUCOSE 126 (H) 08/28/2017 0427   BUN 14 08/28/2017 0427   CREATININE 1.07 (H) 08/28/2017 0427   CALCIUM 8.2 (L) 08/28/2017 0427   PROT 5.8 (L) 08/27/2017 0513   ALBUMIN 2.1 (L) 08/27/2017 0513   AST 18 08/27/2017 0513   ALT 10 (L) 08/27/2017 0513   ALKPHOS 54 08/27/2017 0513   BILITOT 0.9 08/27/2017 0513   GFRNONAA 51 (L) 08/28/2017 0427   GFRAA 60 (L) 08/28/2017 0427   PT/INR No results for input(s): LABPROT, INR in the last 72 hours.  Studies/Results: No results found.  Assessment/Plan: 71 year old female  status post extensive laparotomy with reanastomosis of the colon in 2 places and protection by a proximal loop ileostomy. Now with evidence of seropurulent drainage from the right upper quadrant. We will start her on oral antibiotics. Appreciate wound care assistance with dressing plan for her midline. Encourage ambulation and incentive spirometer usage. Advance diet to soft. Plan to saline lock her IV and start her on oral antibiotics for the drainage. Physical therapy consult today. Potential for discharge home tomorrow as long as patient continues to improve.   Ricarda Frame, MD [facility] General Surgeon [facility]  Day ASCOM [phone] Night ASCOM [phone]  08/30/2017

## 2017-08-30 NOTE — Progress Notes (Signed)
Physical Therapy Evaluation Patient Details Name: Ruth Gray MRN: [id number] DOB: 01-27-1946 Today's Date: 08/30/2017   History of Present Illness  Pt admitted with dx of ileostomy in place, on 10/3 had ileostomy reversal X3, exploratory laparotomy and ileostomy loop creation, POD6.   Clinical Impression  Pt is a pleasant 71 year old female admitted for ileostomy in place. Pt performs bed mobility with mod I., transfers with [facility] with supervision and amb with SPC with CGA. Pt educated on log roll technique for bed mobility due to abdominal wound. Pt amb one lap around nurses's station, able to maintain steady pace, appeared to fatigue and slowed down slightly. Assisted pt to bathroom, able to perform all tasks with supervision. No LOB, dizziness or SOB. Pt educated on use of SPC for amb within home and community. Pt demonstrates deficits with LE strength, and endurance with amb. Would benefit from further skilled PT services to promote optimal return to home. Recommend transition to HHPT upon DC from acute hospitalization.     Follow Up Recommendations Home health PT    Equipment Recommendations  None recommended by PT    Recommendations for Other Services       Precautions / Restrictions Precautions Precautions: None Precaution Comments: abdominal wound Restrictions Weight Bearing Restrictions: No      Mobility  Bed Mobility Overal bed mobility: Modified Independent Bed Mobility: Supine to Sit     Supine to sit: Modified independent (Device/Increase time)     General bed mobility comments: Pt able to go from supine to sit via R log roll, able to use bed rail and push into bed to seated position. Cues for proper log roll technique to prevent abdominal strain, no assist needed.   Transfers Overall transfer level: Needs assistance Equipment used: Straight cane Transfers: Sit to/from Stand Sit to Stand: Supervision         General transfer comment: Pt able to transfer  from seated EOB to standing, no cues needed. No dizziness or LOB.   Ambulation/Gait Ambulation/Gait assistance: Min guard Ambulation Distance (Feet): 230 Feet Assistive device: Straight cane Gait Pattern/deviations: Step-through pattern     General Gait Details: Pt amb with reciprocal alternating gait, able to stand upright and look forward, able to maintain steady pace and say hello to staff in hallway. Appeared slightly fatigued after amb. No reported dizziness or SOB.   Stairs            Wheelchair Mobility    Modified Rankin (Stroke Patients Only)       Balance Overall balance assessment: Modified Independent Sitting-balance support: Feet supported Sitting balance-Leahy Scale: Good Sitting balance - Comments: Pt able to sit at EOB and maintain position. No assist needed.   Standing balance support: Single extremity supported Standing balance-Leahy Scale: Good Standing balance comment: Pt able to stand at EOB with SPC and maintain position. No dizziness or LOB.                              Pertinent Vitals/Pain Pain Assessment: 0-10 Pain Score: 6  Pain Location: abdomen Pain Intervention(s): Limited activity within patient's tolerance;Monitored during session;Repositioned    Home Living Family/patient expects to be discharged to:: Private residence Living Arrangements: Children Available Help at Discharge: Family;Available 24 hours/day Type of Home: House Home Access: Stairs to enter Entrance Stairs-Rails: Right Entrance Stairs-Number of Steps: 2 Home Layout: Multi-level Home Equipment: Walker - 2 wheels;Cane - quad;Cane - single point  Prior Function Level of Independence: Independent with assistive device(s)         Comments: Pt reports no assist device use in home, [facility] for community amb. Pt states being able to perform ADLs independently, however has 24/7 assistance from son.  Pt reports regularly taking mile long walks.      Hand  Dominance        Extremity/Trunk Assessment   Upper Extremity Assessment Upper Extremity Assessment: Generalized weakness (Full elbow flexion/extension against gravity, strong grip B)    Lower Extremity Assessment Lower Extremity Assessment: Generalized weakness;RLE deficits/detail (Able to perform LE ther-ex against gravity, no assist.) RLE Deficits / Details: RLE appeared slightly weaker than LLE during ther-ex    Cervical / Trunk Assessment Cervical / Trunk Assessment: Normal  Communication   Communication: No difficulties  Cognition Arousal/Alertness: Awake/alert Behavior During Therapy: WFL for tasks assessed/performed Overall Cognitive Status: Within Functional Limits for tasks assessed                                        General Comments      Exercises Other Exercises Other Exercises: Supine ther-ex 10x both sides, ankle pumps, hip abd/add, quad sets, SAQs. Able to perform with proper technique. No assist.    Assessment/Plan    PT Assessment Patient needs continued PT services  PT Problem List Decreased strength;Decreased activity tolerance;Decreased mobility;Pain       PT Treatment Interventions DME instruction;Gait training;Stair training;Functional mobility training;Therapeutic activities;Therapeutic exercise;Patient/family education    PT Goals (Current goals can be found in the Care Plan section)  Acute Rehab PT Goals Patient Stated Goal: to return home PT Goal Formulation: With patient Time For Goal Achievement: 09/13/17 Potential to Achieve Goals: Good    Frequency Min 2X/week   Barriers to discharge        Co-evaluation               AM-PAC PT "6 Clicks" Daily Activity  Outcome Measure Difficulty turning over in bed (including adjusting bedclothes, sheets and blankets)?: None Difficulty moving from lying on back to sitting on the side of the bed? : None Difficulty sitting down on and standing up from a chair with arms  (e.g., wheelchair, bedside commode, etc,.)?: None Help needed moving to and from a bed to chair (including a wheelchair)?: A Little Help needed walking in hospital room?: A Little Help needed climbing 3-5 steps with a railing? : A Lot 6 Click Score: 20    End of Session Equipment Utilized During Treatment: Gait belt Activity Tolerance: Patient tolerated treatment well Patient left: in chair;with call bell/phone within reach;with chair alarm set Nurse Communication: Mobility status PT Visit Diagnosis: Other abnormalities of gait and mobility (R26.89);Pain Pain - part of body:  (abdomen)    Time: 1010-1041 PT Time Calculation (min) (ACUTE ONLY): 31 min   Charges:         PT G Codes:   PT G-Codes **NOT FOR INPATIENT CLASS** Functional Assessment Tool Used: AM-PAC 6 Clicks Basic Mobility Functional Limitation: Mobility: Walking and moving around Mobility: Walking and Moving Around Current Status (I9485): At least 20 percent but less than 40 percent impaired, limited or restricted Mobility: Walking and Moving Around Goal Status [phone]): At least 1 percent but less than 20 percent impaired, limited or restricted    Renford Dills, SPT  Renford Dills 08/30/2017, 12:35 PM

## 2017-08-30 NOTE — Telephone Encounter (Signed)
Patient's son Celene Kras has been completed and faxed.  Called patient's son at this time and left a message stating that FMLA has been completed and faxed. Also stated that a hard copy was available for his pick up as well and to call our office with any questions or concerns he may have.

## 2017-08-31 LAB — CREATININE, SERUM
Creatinine, Ser: 0.99 mg/dL (ref 0.44–1.00)
GFR calc Af Amer: >60 mL/min (ref >60)
GFR calc non Af Amer: 56 mL/min — ABNORMAL LOW (ref >60)

## 2017-08-31 MED ORDER — SULFAMETHOXAZOLE-TRIMETHOPRIM 800-160 MG PO TABS: 1.0000 | ORAL_TABLET | Freq: Two times a day (BID) | ORAL | 0 refills | Status: DC

## 2017-08-31 MED ORDER — HYDROCODONE-ACETAMINOPHEN 5-325 MG PO TABS: 1.0000 | ORAL_TABLET | Freq: Four times a day (QID) | ORAL | 0 refills | Status: DC | PRN

## 2017-08-31 NOTE — Progress Notes (Signed)
Per pt wound dressing was changed by wound nurse this morning.

## 2017-08-31 NOTE — Progress Notes (Signed)
Wound nurse was concern about patient's ileostomy appearance after doing the bag change. She show Dr. Earlene Plater a picture of ileostomy. Per MD pt okay to go home. She will follow up with Dr. Tonita Cong and have home heath come to her house.

## 2017-08-31 NOTE — Care Management (Signed)
Patient to discharge home today.  Patient states that she will live in [place] for 3 weeks, then transition to her sons home in [place].  Patient states that she was open with [facility] in [place] until 08/22/17, and would like to use them again for RN, PT, and dressing changes.  Son will be staying with the patient after discharge.  Referral made to [facility] with [facility].  Lorene Dy comfirms that patient can be accepted locally then transition to [place] if indicated.  RNCM signing off.

## 2017-08-31 NOTE — Consult Note (Signed)
WOC Nurse wound follow up Wound type: midline abd dehisced wound Measurement: 3 cm x 2 cm x 3 cm  Wound bed: pink, nongraulating Drainage (amount, consistency, odor) Moderate serosanguinous  No odor Periwound: Intact staple line and ileostomy Dressing procedure/placement/frequency: orders written last visit, wound care performed. Pt to be discharged presumably today. CM working on [facility]   WOC Nurse ostomy follow up Stoma type/location: RLQ ileostomy with plastic retention rod in place Stomal assessment/size:  1 1/2" pink and moist, os is to medial side, small area of discoloration, blister like area at 12 o'clock. Medial suture not attached, bleeding. Prior pouch had leaked blood on the medial side. Picture shown  to Dr. Earlene Plater who said he would show to Dr. Clydene Pugh. Peristomal assessment: intact.   Treatment options for stomal/peristomal skin: barrier ring and 2 1/4" Output: liquid green effluent Ostomy pouching: 2 piece 2 1/4" pouch with barrier ring  Education provided: Pouch change today.  Son came in during the pouch change, asked appropriate questions. States feels secure in caring for it with help of HHRN.  5 Pouches and barriers, and 5 barrier rings left at bedside for discharge.  Enrolled patient in [place] Secure Start Discharge program: yes Pt to be discharged today. Spoke with pt's nurse who was concerned about discharge since he heard me say the medial suture was not intact. I told her about showing Dr.  Earlene Plater and that Dr. Earlene Plater was going to talk to Dr. Clydene Pugh.  Bedside RN states he will call Dr. Clydene Pugh and make sure he was contacted by Dr. Earlene Plater. Will check later in week to make sure if pt was discharged.   Barnett Hatter, RN-C, WTA-C, OCA Wound Treatment Associate Ostomy Care Associate

## 2017-08-31 NOTE — Discharge Summary (Signed)
Patient ID: Ruth Gray MRN: [id number] DOB/AGE: 1946-11-10 71 y.o.  Admit date: 08/24/2017 Discharge date: 08/31/2017  Discharge Diagnoses:  Ostomy reversal of loop ileostomy creation  Procedures Performed: Exploratory laparotomy, colostomy reversal 2, loop ileostomy creation.  Discharged Condition: good  Hospital Course: Patient brought in the hospital for a planned ostomy reversal. Tolerated the procedure well. On the day of discharge her abdomen was soft and nontender. She did have a midline wound that was being treated with twice daily dressings. Ostomy is functioning well and she was tolerating a regular diet.  Discharge Orders:  discharge home  Disposition: 06-Home-Health Care Svc  Discharge Medications: Allergies as of 08/31/2017      Reactions   Ciprofloxacin Swelling   Penicillins Swelling, Rash   Has patient had a PCN reaction causing immediate rash, facial/tongue/throat swelling, SOB or lightheadedness with hypotension: yes Has patient had a PCN reaction causing severe rash involving mucus membranes or skin necrosis: no Has patient had a PCN reaction that required hospitalization no Has patient had a PCN reaction occurring within the last 10 years: no If all of the above answers are "NO", then may proceed with Cephalosporin use.   Tramadol Nausea And Vomiting      Medication List    TAKE these medications   acetaminophen 500 MG tablet Commonly known as:  TYLENOL Take 1,000 mg by mouth every 8 (eight) hours as needed.   CALCIUM 600+D3 PO Take 2 tablets by mouth daily.   carbonyl iron 45 MG Tabs tablet Commonly known as:  FEOSOL Take 45 mg by mouth every Monday, Wednesday, and Friday. In the morning.   ELIQUIS 5 MG Tabs tablet Generic drug:  apixaban TAKE 1 TABLET (5 MG TOTAL) BY MOUTH EVERY 12 (TWELVE) HOURS.   folic acid 1 MG tablet Commonly known as:  FOLVITE Take 2 mg by mouth daily with breakfast.   HYDROcodone-acetaminophen 5-325 MG  tablet Commonly known as:  NORCO/VICODIN Take 1-2 tablets by mouth every 6 (six) hours as needed for moderate pain or severe pain.   methotrexate 50 MG/2ML injection Inject 0.8 mL subcutaneously once a week on Sundays.   metoprolol tartrate 25 MG tablet Commonly known as:  LOPRESSOR TAKE 1/2 TABLET (12.5 MG TOTAL) BY MOUTH 2 (TWO) TIMES DAILY.   naproxen sodium 220 MG tablet Commonly known as:  ANAPROX Take 220 mg by mouth 2 (two) times daily as needed (for pain.).   sulfamethoxazole-trimethoprim 800-160 MG tablet Commonly known as:  BACTRIM DS,SEPTRA DS Take 1 tablet by mouth every 12 (twelve) hours.        Follwup: Follow-up Information    Ricarda Frame, MD. Go in 1 week(s).   Specialty:  General Surgery Why:  Report to clinic at 10:15 for a 10:30 AM appointment on 10/17 Contact information: [address] [place] [address] [phone]           Signed: Ricarda Frame 08/31/2017, 7:04 AM

## 2017-08-31 NOTE — Discharge Instructions (Signed)
Loop Ileostomy, Care After Refer to this sheet in the next few weeks. These instructions provide you with information about caring for yourself after your procedure. Your health care provider may also give you more specific instructions. Your treatment has been planned according to current medical practices, but problems sometimes occur. Call your health care provider if you have any problems or questions after your procedure. What can I expect after the procedure? After the procedure, it is common to have:  A small amount of blood or clear fluid leaking from your stoma.  Pain and discomfort in your abdomen, especially around your stoma.  Irregular bowel movements for several days.  Loose stool.  Follow these instructions at home: Medicines  Take over-the-counter and prescription medicines only as told by your health care provider.  If you were prescribed an antibiotic medicine, take it as told by your health care provider. Do not stop taking the antibiotic even if you start to feel better. Stoma Care  Keep your stoma and the surrounding skin clean and dry.  Follow your health care providers instructions about how to take care of your stoma. It is important to: ? Wash your hands with soap and water before you change your bandage (dressing). If soap and water are not available, use hand sanitizer. ? Change your dressing as told by your health care provider. ? Leave stitches (sutures), skin glue, or adhesive strips in place. In some cases, these skin closures may need to be in place for 2 weeks or longer. If adhesive strip edges start to loosen and curl up, you may trim the loose edges. Do not remove adhesive strips entirely unless your health care provider tells you to remove them.  Check your stoma area every day for signs of infection. Check for: ? More redness, swelling, or pain. ? More fluid or blood. ? Warmth. ? Pus or a bad smell.  Follow your health care providers  instructions about changing and cleaning your ostomy pouch.  Keep supplies to care for your stoma and ostomy pouch with you at all times. Eating and drinking   Follow instructions from your health care provider about eating or drinking restrictions.  Pay attention to which foods and drinks cause problems with digestion, such as gas, constipation, or diarrhea.  Avoid spicy foods and caffeine while your stoma heals.  Eat meals and snacks at regular intervals.  Drink enough fluid to keep your urine clear or pale yellow. Activity  Return to your normal activities as told by your health care provider. Ask your health care provider what activities are safe for you.  Rest as much as possible while your stoma heals.  Avoid intense physical activity for as long as you are told by your health care provider.  Do not lift anything that is heavier than 10 lb (4.5 kg) for 6 weeks or as long as told by your health care provider. Driving  Do not drive for 24 hours if you received a sedative.  Do not drive or operate heavy machinery while taking prescription pain medicine. General instructions  Wear compression stockings as told by your health care provider. These stockings help to prevent blood clots and reduce swelling in your legs.  Do not take baths, swim, or use a hot tub until your health care provider approves.  Do not use tobacco products, including cigarettes, chewing tobacco, or e-cigarettes. If you need help quitting, ask your health care provider.  (Women) If you plan to become pregnant or if  you take birth control pills, discuss this with your health care provider.  Keep all follow-up visits as told by your health care provider. This is important. Contact a health care provider if:  You have more redness, swelling, or pain around your stoma.  You have more fluid or blood coming from your stoma.  Your stoma feels warm to the touch.  You have pus or a bad smell coming from  your stoma.  You have a fever.  You have loose stools that do not get firmer after several weeks.  You have bowel movements more or less often than is expected by your health care provider.  You feel nauseous.  You vomit.  You have abdominal pain, bloating, pressure, or cramping.  You have problems with sexual activity.  You have an unusual lack of energy (fatigue).  You are unusually thirsty or you always have a dry mouth. Get help right away if:  You feel dizzy or lightheaded.  You have abdominal pain or cramps that do not go away with medicine or get worse.  Your stoma suddenly changes size or color.  You have shortness of breath.  You have bleeding from your stoma that does not stop.  You vomit more than once.  You faint.  You have internal tissue coming out of your stoma (prolapse).  You have an irregular heartbeat.  You have chest pain. This information is not intended to replace advice given to you by your health care provider. Make sure you discuss any questions you have with your health care provider. Document Released: 06/21/2011 Document Revised: 12/04/2015 Document Reviewed: 08/29/2015 Elsevier Interactive Patient Education  2018 [employer]  Nonsutured Laceration Care A laceration is a cut that goes through all layers of the skin and extends into the tissue that is right under the skin. This type of cut is usually stitched up (sutured) or closed with tape (adhesive strips) or skin glue shortly after the injury happens. However, if the wound is dirty or if several hours pass before medical treatment is provided, it is likely that germs (bacteria) will enter the wound. Closing a laceration after bacteria have entered it increases the risk of infection. In these cases, your health care provider may leave the laceration open (nonsutured) and cover it with a bandage. This type of treatment helps prevent infection and allows the wound to heal from the deepest  layer of tissue damage up to the surface. An open fracture is a type of injury that may involve nonsutured lacerations. An open fracture is a break in a bone that happens along with one or more lacerations through the skin that is near the fracture site. How to care for your nonsutured laceration  Take or apply over-the-counter and prescription medicines only as told by your health care provider.  If you were prescribed an antibiotic medicine, take or apply it as told by your health care provider. Do not stop using the antibiotic even if your condition improves.  Clean the wound one time each day or as told by your health care provider. ? Wash the wound with mild soap and water. ? Rinse the wound with water to remove all soap. ? Pat your wound dry with a clean towel. Do not rub the wound.  Do not inject anything into the wound unless your health care provider told you to.  Change any bandages (dressings) as told by your health care provider. This includes changing the dressing if it gets wet, dirty, or starts  to smell bad.  Keep the dressing dry until your health care provider says it can be removed. Do not take baths, swim, or do anything that puts your wound underwater until your health care provider approves.  Raise (elevate) the injured area above the level of your heart while you are sitting or lying down, if possible.  Do not scratch or pick at the wound.  Check your wound every day for signs of infection. Watch for: ? Redness, swelling, or pain. ? Fluid, blood, or pus.  Keep all follow-up visits as told by your health care provider. This is important. Contact a health care provider if:  You received a tetanus and shot and you have swelling, severe pain, redness, or bleeding at the injection site.  You have a fever.  Your pain is not controlled with medicine.  You have increased redness, swelling, or pain at the site of your wound.  You have fluid, blood, or pus coming  from your wound.  You notice a bad smell coming from your wound or your dressing.  You notice something coming out of the wound, such as wood or glass.  You notice a change in the color of your skin near your wound.  You develop a new rash.  You need to change the dressing frequently due to fluid, blood, or pus draining from the wound.  You develop numbness around your wound. Get help right away if:  Your pain suddenly increases and is severe.  You develop severe swelling around the wound.  The wound is on your hand or foot and you cannot properly move a finger or toe.  The wound is on your hand or foot and you notice that your fingers or toes look pale or bluish.  You have a red streak going away from your wound. This information is not intended to replace advice given to you by your health care provider. Make sure you discuss any questions you have with your health care provider. Document Released: 10/06/2006 Document Revised: 04/15/2016 Document Reviewed: 11/04/2014 Elsevier Interactive Patient Education  [employer].

## 2017-08-31 NOTE — Progress Notes (Signed)
Ruth Gray  A and O x 4. VSS. Pt tolerating diet well. No complaints of pain or nausea. IV removed intact, prescriptions given.  Also wound and diet education were given to the pt. Pt voiced understanding of discharge instructions with no further questions.  Pt discharged via wheelchair with RN.     Allergies as of 08/31/2017      Reactions   Ciprofloxacin Swelling   Penicillins Swelling, Rash   Has patient had a PCN reaction causing immediate rash, facial/tongue/throat swelling, SOB or lightheadedness with hypotension: yes Has patient had a PCN reaction causing severe rash involving mucus membranes or skin necrosis: no Has patient had a PCN reaction that required hospitalization no Has patient had a PCN reaction occurring within the last 10 years: no If all of the above answers are "NO", then may proceed with Cephalosporin use.   Tramadol Nausea And Vomiting      Medication List    TAKE these medications   acetaminophen 500 MG tablet Commonly known as:  TYLENOL Take 1,000 mg by mouth every 8 (eight) hours as needed.   CALCIUM 600+D3 PO Take 2 tablets by mouth daily.   carbonyl iron 45 MG Tabs tablet Commonly known as:  FEOSOL Take 45 mg by mouth every Monday, Wednesday, and Friday. In the morning.   ELIQUIS 5 MG Tabs tablet Generic drug:  apixaban TAKE 1 TABLET (5 MG TOTAL) BY MOUTH EVERY 12 (TWELVE) HOURS.   folic acid 1 MG tablet Commonly known as:  FOLVITE Take 2 mg by mouth daily with breakfast.   HYDROcodone-acetaminophen 5-325 MG tablet Commonly known as:  NORCO/VICODIN Take 1-2 tablets by mouth every 6 (six) hours as needed for moderate pain or severe pain.   methotrexate 50 MG/2ML injection Inject 0.8 mL subcutaneously once a week on Sundays.   metoprolol tartrate 25 MG tablet Commonly known as:  LOPRESSOR TAKE 1/2 TABLET (12.5 MG TOTAL) BY MOUTH 2 (TWO) TIMES DAILY.   naproxen sodium 220 MG tablet Commonly known as:  ANAPROX Take 220 mg by mouth 2  (two) times daily as needed (for pain.).   sulfamethoxazole-trimethoprim 800-160 MG tablet Commonly known as:  BACTRIM DS,SEPTRA DS Take 1 tablet by mouth every 12 (twelve) hours.       Vitals:   08/31/17 0914 08/31/17 1140  BP: (!) 116/58 (!) 97/57  Pulse: (!) 105 93  Resp:    Temp:  98.2 F (36.8 C)  SpO2:  98%    Suzzanne Cloud

## 2017-09-02 ENCOUNTER — Other Ambulatory Visit: Payer: Self-pay

## 2017-09-07 ENCOUNTER — Ambulatory Visit (INDEPENDENT_AMBULATORY_CARE_PROVIDER_SITE_OTHER): Payer: Medicare Other | Admitting: General Surgery

## 2017-09-07 ENCOUNTER — Encounter: Payer: Self-pay | Admitting: General Surgery

## 2017-09-07 VITALS — BP 101/69 | HR 82 | Temp 97.5°F | Ht 67.0 in | Wt 150.4 lb

## 2017-09-07 DIAGNOSIS — Z4889 Encounter for other specified surgical aftercare: Secondary | ICD-10-CM

## 2017-09-07 NOTE — Patient Instructions (Signed)
We removed your drains today, however you may see an increase in drainage. Please keep the area covered as long as you have this drainage.  We removed some of the staples and placed steri strips. These will begin to peel up on the ends and fall off in 7/10 days.  You may shower as usual, however be sure to pat the strips dry.   Please continue to drink fluids and stay hydrated.  Please see your follow up appointment listed below.

## 2017-09-07 NOTE — Progress Notes (Signed)
Outpatient Surgical Follow Up  09/07/2017  Ruth Gray is an 71 y.o. female.   Chief Complaint  Patient presents with  . Routine Post Op    exploratory Laparotomy-extensive lysis of adhesion-Splenic flexure-mobilization colo-rectal stapled anastomis ileocolic anastomis debridement abdominal wall loop-08-24-17-Dr.Tyrone Balash    HPI: 71 year old female returns to clinic now 2 weeks status post laparotomy with ostomy reversals and loop ileostomy creation. Patient reports doing well. Only occasionally needing pain medications. She is tolerating a diet and having good ostomy output without any leaking. Her midline wound has remained unchanged since discharge from the hospital and is continuing to be packed. She is having appropriate drainage from her prior ostomy site. She denies any fevers, chills, nausea, vomiting, chest pain, shortness of breath.  Past Medical History:  Diagnosis Date  . Anemia   . Collagen vascular disease (HCC)   . DVT (deep venous thrombosis) (HCC)    left leg  . Perforation of colon (HCC) 08/2016  . Rheumatoid arteritis     Past Surgical History:  Procedure Laterality Date  . ABDOMINAL HYSTERECTOMY    . CENTRAL VENOUS CATHETER INSERTION  09/21/2016   Procedure: INSERTION CENTRAL LINE ADULT;  Surgeon: Tiney Rouge III, MD;  Location: [facility];  Service: General;;  . COLON RESECTION  09/21/2016   Procedure: COLON RESECTION- Ascending and Sigmoid;  Surgeon: Tiney Rouge III, MD;  Location: [facility];  Service: General;;  . COLOSTOMY N/A 09/25/2016   Procedure: COLOSTOMY;  Surgeon: Tiney Rouge III, MD;  Location: [facility];  Service: General;  Laterality: N/A;  . DEBRIDEMENT OF ABDOMINAL WALL ABSCESS N/A 12/03/2016   Procedure: DEBRIDEMENT OF ABDOMINAL WALL ABSCESS;  Surgeon: Leafy Ro, MD;  Location: [facility];  Service: General;  Laterality: N/A;  . FOOT SURGERY Left   . ILEOSTOMY  08/24/2017   Procedure: ILEOSTOMY Loop Creation;  Surgeon: Ricarda Frame, MD;  Location:  [facility];  Service: General;;  . ILEOSTOMY CLOSURE N/A 08/24/2017   Procedure: ILEOSTOMY REVERSAL X3;  Surgeon: Ricarda Frame, MD;  Location: [facility];  Service: General;  Laterality: N/A;  . LAPAROTOMY N/A 09/21/2016   Procedure: EXPLORATORY LAPAROTOMY;  Surgeon: Tiney Rouge III, MD;  Location: [facility];  Service: General;  Laterality: N/A;  . LAPAROTOMY N/A 09/25/2016   Procedure: EXPLORATORY LAPAROTOMY and right colon resection;  Surgeon: Tiney Rouge III, MD;  Location: [facility];  Service: General;  Laterality: N/A;  . LAPAROTOMY  08/24/2017   Procedure: EXPLORATORY LAPAROTOMY;  Surgeon: Ricarda Frame, MD;  Location: [facility];  Service: General;;  . REPLACEMENT TOTAL KNEE Left     Family History  Problem Relation Age of Onset  . Breast cancer Other 36  . Prostate cancer Father   . Ovarian cancer Sister   . Kidney cancer Brother   . Lung cancer Sister     Social History:  reports that she quit smoking about 13 months ago. Her smoking use included Cigarettes. She smoked 0.50 packs per day. She has never used smokeless tobacco. She reports that she does not drink alcohol or use drugs.  Allergies:  Allergies  Allergen Reactions  . Ciprofloxacin Swelling and Other (See Comments)    Joint pain  . Tramadol Nausea And Vomiting    Had taken in past with no trouble but last prescription had nausea and vomiting  . Penicillins Swelling and Rash    Has patient had a PCN reaction causing immediate rash, facial/tongue/throat swelling, SOB or lightheadedness with hypotension: yes Has patient had a PCN  reaction causing severe rash involving mucus membranes or skin necrosis: no Has patient had a PCN reaction that required hospitalization no Has patient had a PCN reaction occurring within the last 10 years: no If all of the above answers are "NO", then may proceed with Cephalosporin use.     Medications reviewed.    ROS A multipoint review of systems was completed, all pertinent positives  and negatives are documented in the history of present illness and remainder are negative   BP 101/69   Pulse 82   Temp (!) 97.5 F (36.4 C) (Oral)   Ht 5\' 7"  (1.702 m)   Wt 68.2 kg (150 lb 6.4 oz)   BMI 23.56 kg/m   Physical Exam Gen.: No acute distress  chest: Clear to auscultation  heart: Regular rhythm Abdomen: Soft, nontender, nondistended. Staples in place the midline in bilateral upper quadrant prior ostomy sites.Penrose drains remained in place to the bilateral ostomy sites. There is an opening around the umbilicus approximately 3 cm in greatest diameter with healthy-appearing granulation tissue. No evidence of active purulence. Loop ileostomy present in the right lower quadrant that is pink, patent, productive of liquid stool.    No results found for this or any previous visit (from the past 48 hour(s)). No results found.  Assessment/Plan:  1. Aftercare following surgery 71 year old female status post extensive laparotomy with multiple ostomy reversal. Doing very well. Penrose drains removed from the bilateral prior ostomy sites but staples not quite ready to be removed. Staples from the midline wound were able be removed and replaced with Steri-Strips. Discussed continuing wet-to-dry packing to the umbilical wound that is healing appropriately. Loop ileostomy functioning well and the Hollister bridge was removed today without any difficulty. Discussed continued wound care and encouraged oral intake and activity. Patient voiced understanding and will follow-up next week with Dr. 66 to evaluate whether not the remaining staples were able be removed. She'll then follow up with me in 1-2 weeks after that visit.patient understands that it will be another 2-3 months before we test her anastomoses with a barium enema.     Excell Seltzer, MD FACS General Surgeon  09/07/2017,10:35 AM

## 2017-09-15 ENCOUNTER — Encounter: Payer: Self-pay | Admitting: Surgery

## 2017-09-15 ENCOUNTER — Ambulatory Visit (INDEPENDENT_AMBULATORY_CARE_PROVIDER_SITE_OTHER): Payer: Medicare Other | Admitting: Surgery

## 2017-09-15 VITALS — BP 102/69 | HR 82 | Temp 97.9°F | Ht 67.0 in | Wt 148.6 lb

## 2017-09-15 DIAGNOSIS — Z932 Ileostomy status: Secondary | ICD-10-CM

## 2017-09-15 NOTE — Patient Instructions (Addendum)
We will see you back next Tuesday. Please see appointment below.  Continue wet to dry dressing in middle incision. Place Lotrimin on both outer incisions to help heal yeast. Make sure that incisions are cleaned and dried thoroughly twice daily before placing the ointment. You may keep a dressing on the left incision if it continues to weep some blood.  You may drive as long as you can wear a seatbelt without pain, do all motions of driving while sitting in the driver's seat and are no longer taking any narcotic pain medication.  Please call me with any other questions or concerns prior to your next appointment.   GENERAL POST-OPERATIVE PATIENT INSTRUCTIONS   WOUND CARE INSTRUCTIONS:  Keep a dry clean dressing on the wound if there is drainage. The initial bandage may be removed after 24 hours.  Once the wound has quit draining you may leave it open to air.  If clothing rubs against the wound or causes irritation and the wound is not draining you may cover it with a dry dressing during the daytime.  Try to keep the wound dry and avoid ointments on the wound unless directed to do so.  If the wound becomes bright red and painful or starts to drain infected material that is not clear, please contact your physician immediately.  If the wound is mildly pink and has a thick firm ridge underneath it, this is normal, and is referred to as a healing ridge.  This will resolve over the next 4-6 weeks.  BATHING: You may shower if you have been informed of this by your surgeon. However, Please do not submerge in a tub, hot tub, or pool until incisions are completely sealed or have been told by your surgeon that you may do so.  DIET:  You may eat any foods that you can tolerate.  It is a good idea to eat a high fiber diet and take in plenty of fluids to prevent constipation.  If you do become constipated you may want to take a mild laxative or take ducolax tablets on a daily basis until your bowel habits are  regular.  Constipation can be very uncomfortable, along with straining, after recent surgery.  ACTIVITY:  You are encouraged to cough and deep breath or use your incentive spirometer if you were given one, every 15-30 minutes when awake.  This will help prevent respiratory complications and low grade fevers post-operatively if you had a general anesthetic.  You may want to hug a pillow when coughing and sneezing to add additional support to the surgical area, if you had abdominal or chest surgery, which will decrease pain during these times.  You are encouraged to walk and engage in light activity for the next two weeks.  You should not lift more than 20 pounds, until 10/05/2017 as it could put you at increased risk for complications.  Twenty pounds is roughly equivalent to a plastic bag of groceries. At that time- Listen to your body when lifting, if you have pain when lifting, stop and then try again in a few days. Soreness after doing exercises or activities of daily living is normal as you get back in to your normal routine.  MEDICATIONS:  Try to take narcotic medications and anti-inflammatory medications, such as tylenol, ibuprofen, naprosyn, etc., with food.  This will minimize stomach upset from the medication.  Should you develop nausea and vomiting from the pain medication, or develop a rash, please discontinue the medication and contact your  physician.  You should not drive, make important decisions, or operate machinery when taking narcotic pain medication.  SUNBLOCK Use sun block to incision area over the next year if this area will be exposed to sun. This helps decrease scarring and will allow you avoid a permanent darkened area over your incision.  QUESTIONS:  Please feel free to call our office if you have any questions, and we will be glad to assist you. [phone]

## 2017-09-15 NOTE — Progress Notes (Signed)
Outpatient postop visit  09/15/2017  Ruth Gray is an 71 y.o. female.    Procedure: Exploratory laparotomy with lysis of adhesions for colostomy closure and ileostomy formation by Dr. Tonita Cong  CC:No problems except fatigue  HPI: Status post exploratory laparotomy with lysis of adhesions for colostomy closure and ileostomy formation.  A diverting loop ileostomy was performed.  Feels well and is having no problems.  She is here for further staple removal.  She does have an open wound which she is using wet-to-dry dressings. Medications reviewed.    Physical Exam:  There were no vitals taken for this visit.    PE: Functional ileostomy, open and granulating wound on the midline.  No erythema no drainage.  Staples were removed from the midline and from both transverse incisions.  Ostomy is functional.  Calves are nontender.    Assessment/Plan:  This a patient status post ex lap for colostomy closure and ileostomy formation and with lysis of adhesions.  This was performed by Dr. Lucretia Roers ham.  The patient has a functional ileostomy for protection. Recommend some Lotrimin cream for areas of skin folds that are suggesting yeast growth.  Otherwise she is doing quite well she will follow-up with Dr. Tonita Cong next week. Ruth Haw, MD, FACS

## 2017-09-20 ENCOUNTER — Telehealth: Payer: Self-pay | Admitting: General Practice

## 2017-09-20 ENCOUNTER — Ambulatory Visit (INDEPENDENT_AMBULATORY_CARE_PROVIDER_SITE_OTHER): Payer: Medicare Other | Admitting: General Surgery

## 2017-09-20 ENCOUNTER — Encounter: Payer: Self-pay | Admitting: General Surgery

## 2017-09-20 VITALS — BP 102/67 | HR 69 | Temp 97.6°F | Ht 62.0 in | Wt 149.8 lb

## 2017-09-20 DIAGNOSIS — Z4889 Encounter for other specified surgical aftercare: Secondary | ICD-10-CM

## 2017-09-20 NOTE — Progress Notes (Signed)
Outpatient Surgical Follow Up  09/20/2017  Ruth Gray is an 71 y.o. female.   Chief Complaint  Patient presents with  . Routine Post Op    Ileostomy Takedown x 3 loop Ileostomy creation-08-24-17 Dr.Abbee Cremeens    HPI: 71 year old female returns to clinic now 3 weeks status post ostomy takedown and loop ileostomy creation.  She reports doing well.  She is tolerating a diet and not requiring any pain medications.  Denies any fevers, chills, nausea, vomiting, chest pain, shortness of breath.  She is maintaining a good seal with her loop ileostomy and performing her local wound care without any difficulty.  Past Medical History:  Diagnosis Date  . Anemia   . Collagen vascular disease (HCC)   . DVT (deep venous thrombosis) (HCC)    left leg  . Perforation of colon (HCC) 08/2016  . Rheumatoid arteritis     Past Surgical History:  Procedure Laterality Date  . ABDOMINAL HYSTERECTOMY    . CENTRAL VENOUS CATHETER INSERTION  09/21/2016   Procedure: INSERTION CENTRAL LINE ADULT;  Surgeon: Tiney Rouge III, MD;  Location: [facility];  Service: General;;  . COLON RESECTION  09/21/2016   Procedure: COLON RESECTION- Ascending and Sigmoid;  Surgeon: Tiney Rouge III, MD;  Location: [facility];  Service: General;;  . COLOSTOMY N/A 09/25/2016   Procedure: COLOSTOMY;  Surgeon: Tiney Rouge III, MD;  Location: [facility];  Service: General;  Laterality: N/A;  . DEBRIDEMENT OF ABDOMINAL WALL ABSCESS N/A 12/03/2016   Procedure: DEBRIDEMENT OF ABDOMINAL WALL ABSCESS;  Surgeon: Leafy Ro, MD;  Location: [facility];  Service: General;  Laterality: N/A;  . FOOT SURGERY Left   . ILEOSTOMY  08/24/2017   Procedure: ILEOSTOMY Loop Creation;  Surgeon: Ricarda Frame, MD;  Location: [facility];  Service: General;;  . ILEOSTOMY CLOSURE N/A 08/24/2017   Procedure: ILEOSTOMY REVERSAL X3;  Surgeon: Ricarda Frame, MD;  Location: [facility];  Service: General;  Laterality: N/A;  . LAPAROTOMY N/A 09/21/2016   Procedure: EXPLORATORY  LAPAROTOMY;  Surgeon: Tiney Rouge III, MD;  Location: [facility];  Service: General;  Laterality: N/A;  . LAPAROTOMY N/A 09/25/2016   Procedure: EXPLORATORY LAPAROTOMY and right colon resection;  Surgeon: Tiney Rouge III, MD;  Location: [facility];  Service: General;  Laterality: N/A;  . LAPAROTOMY  08/24/2017   Procedure: EXPLORATORY LAPAROTOMY;  Surgeon: Ricarda Frame, MD;  Location: [facility];  Service: General;;  . REPLACEMENT TOTAL KNEE Left     Family History  Problem Relation Age of Onset  . Breast cancer Other 36  . Prostate cancer Father   . Ovarian cancer Sister   . Kidney cancer Brother   . Lung cancer Sister     Social History:  reports that she quit smoking about 14 months ago. Her smoking use included Cigarettes. She smoked 0.50 packs per day. She has never used smokeless tobacco. She reports that she does not drink alcohol or use drugs.  Allergies:  Allergies  Allergen Reactions  . Ciprofloxacin Swelling and Other (See Comments)    Joint pain  . Tramadol Nausea And Vomiting    Had taken in past with no trouble but last prescription had nausea and vomiting  . Penicillins Swelling and Rash    Has patient had a PCN reaction causing immediate rash, facial/tongue/throat swelling, SOB or lightheadedness with hypotension: yes Has patient had a PCN reaction causing severe rash involving mucus membranes or skin necrosis: no Has patient had a PCN reaction that required hospitalization no  Has patient had a PCN reaction occurring within the last 10 years: no If all of the above answers are "NO", then may proceed with Cephalosporin use.     Medications reviewed.    ROS A multipoint review of systems was completed, all pertinent positives and negatives are documented in the HPI and the remainder are negative   BP 102/67   Pulse 69   Temp 97.6 F (36.4 C) (Oral)   Ht 5\' 2"  (1.575 m)   Wt 67.9 kg (149 lb 12.8 oz)   BMI 27.40 kg/m   Physical Exam  General: No acute  distress Chest: Clear to station Heart: Rate and rhythm Abdomen: Soft, nontender, nondistended.  Midline wound with a 2 x 2.5 x 2 cm area of granulation tissue without any evidence of erythema or drainage.  Loop ileostomy in the right lower quadrant that is pink, patent, productive of gas and stool.  Previous ostomy sites well approximated without any active signs of infection.   No results found for this or any previous visit (from the past 48 hour(s)). No results found.  Assessment/Plan:  1. Aftercare following surgery 71 year old female doing very well status post ostomy takedown and loop ileostomy creation.  Discussed continuing local wound care.  Discussed the signs and symptoms of infection and to return to clinic immediately should they occur.  Otherwise, she will follow-up in clinic in 3 weeks for additional wound check and to discuss scheduling her barium enema for ileostomy takedown planning.     67, MD FACS General Surgeon  09/20/2017,11:03 AM

## 2017-09-20 NOTE — Telephone Encounter (Signed)
Patient called about her [facility], she said her insurance approved it and said was good through 10/13-12/11. Patient is asking do you guys need to call and let them know she is going through it so she can get her supplies. Please call patient and advice.

## 2017-09-20 NOTE — Patient Instructions (Signed)
Please continue with wet to dry packing changes daily. Please call our office if you have questions or concerns.

## 2017-09-27 ENCOUNTER — Telehealth: Payer: Self-pay

## 2017-09-27 NOTE — Telephone Encounter (Signed)
Patient wanted to let us know [facility] nursing has been twice to her home in [place] and when she gets to [place] later this week she will need to call the office there and they can continue the wound care and order the supplies that is needed.

## 2017-09-29 ENCOUNTER — Telehealth: Payer: Self-pay

## 2017-09-29 NOTE — Telephone Encounter (Signed)
Patient calls in at this time requesting that we contact [facility] in [place] [phone].  She needs supplies for wound care, ostomy supplies and home nurse visits.

## 2017-09-29 NOTE — Telephone Encounter (Addendum)
Spoke with Lurena Joiner at [facility] in [place]. She told me that they had  discharged her from services on 10/10 due to her no longer living in [place]. I advised her that I'm not sure of patient's current living arrangements. She advised that we can send over a new referral with new address, and new instructions to the following fax number [phone]. I verbalized understanding at this time  Left message for patient to call and let us know of her new address so that we can send over a referral to start her home health services.   Patient called back at this time and advised me that she is currently living in [place] at the current address: [address] [phone]. I verbalized understanding and advised patient I will get this infomation faxed over to [facility].  Physician order letter typed and signed. Letter has been faxed to [facility].

## 2017-10-06 NOTE — Telephone Encounter (Signed)
I have faxed a referral with demographics, orders from provider and clinic notes to Lurena Joiner at [facility] to [phone].

## 2017-10-10 ENCOUNTER — Encounter: Payer: Medicare Other | Admitting: General Surgery

## 2017-10-11 ENCOUNTER — Ambulatory Visit (INDEPENDENT_AMBULATORY_CARE_PROVIDER_SITE_OTHER): Payer: Medicare Other | Admitting: General Surgery

## 2017-10-11 ENCOUNTER — Encounter: Payer: Self-pay | Admitting: General Surgery

## 2017-10-11 VITALS — BP 124/77 | HR 73 | Temp 97.8°F | Wt 152.0 lb

## 2017-10-11 DIAGNOSIS — Z932 Ileostomy status: Secondary | ICD-10-CM

## 2017-10-11 NOTE — Patient Instructions (Addendum)
Happy Holidays!  We have scheduled you for a Barium Enema today. Your appointment has been scheduled for 12/01/2017 at 9:00 AM. You will need to arrive at 8:45 AM to register for this test.  Bring a list of medications with you to your appointment.  You will need to be on clear liquid diet for 24 hours prior to your appointment and may not have anything to eat or drink after midnight the day of your testing.  If you need to reschedule your Scan, you may do so by calling [phone].

## 2017-10-12 ENCOUNTER — Encounter: Payer: Self-pay | Admitting: General Surgery

## 2017-10-12 NOTE — Progress Notes (Signed)
Outpatient Surgical Follow Up  10/12/2017  Ruth Gray is an 71 y.o. female.   Chief Complaint  Patient presents with  . Routine Post Op    Ileostomy Takedown x3 Loop Ileostomy creation Dr.Nils Thor -08-24-17    HPI: 71 year old female who is well known to the service returns to clinic now 6 weeks s/p colostomy closure and loop ileostomy creation. She reports that she is doing well. Denies any pain and has been eating well and having return of normal energy levels. She has a small midline wound that is gradually improving. Denies any other complaints.  Past Medical History:  Diagnosis Date  . Anemia   . Collagen vascular disease (HCC)   . DVT (deep venous thrombosis) (HCC)    left leg  . Perforation of colon (HCC) 08/2016  . Rheumatoid arteritis     Past Surgical History:  Procedure Laterality Date  . ABDOMINAL HYSTERECTOMY    . CENTRAL VENOUS CATHETER INSERTION  09/21/2016   Procedure: INSERTION CENTRAL LINE ADULT;  Surgeon: Tiney Rouge III, MD;  Location: [facility];  Service: General;;  . COLON RESECTION  09/21/2016   Procedure: COLON RESECTION- Ascending and Sigmoid;  Surgeon: Tiney Rouge III, MD;  Location: [facility];  Service: General;;  . COLOSTOMY N/A 09/25/2016   Procedure: COLOSTOMY;  Surgeon: Tiney Rouge III, MD;  Location: [facility];  Service: General;  Laterality: N/A;  . DEBRIDEMENT OF ABDOMINAL WALL ABSCESS N/A 12/03/2016   Procedure: DEBRIDEMENT OF ABDOMINAL WALL ABSCESS;  Surgeon: Leafy Ro, MD;  Location: [facility];  Service: General;  Laterality: N/A;  . FOOT SURGERY Left   . ILEOSTOMY  08/24/2017   Procedure: ILEOSTOMY Loop Creation;  Surgeon: Ricarda Frame, MD;  Location: [facility];  Service: General;;  . ILEOSTOMY CLOSURE N/A 08/24/2017   Procedure: ILEOSTOMY REVERSAL X3;  Surgeon: Ricarda Frame, MD;  Location: [facility];  Service: General;  Laterality: N/A;  . LAPAROTOMY N/A 09/21/2016   Procedure: EXPLORATORY LAPAROTOMY;  Surgeon: Tiney Rouge III, MD;   Location: [facility];  Service: General;  Laterality: N/A;  . LAPAROTOMY N/A 09/25/2016   Procedure: EXPLORATORY LAPAROTOMY and right colon resection;  Surgeon: Tiney Rouge III, MD;  Location: [facility];  Service: General;  Laterality: N/A;  . LAPAROTOMY  08/24/2017   Procedure: EXPLORATORY LAPAROTOMY;  Surgeon: Ricarda Frame, MD;  Location: [facility];  Service: General;;  . REPLACEMENT TOTAL KNEE Left     Family History  Problem Relation Age of Onset  . Breast cancer Other 36  . Prostate cancer Father   . Ovarian cancer Sister   . Kidney cancer Brother   . Lung cancer Sister     Social History:  reports that she quit smoking about 14 months ago. Her smoking use included cigarettes. She smoked 0.50 packs per day. she has never used smokeless tobacco. She reports that she does not drink alcohol or use drugs.  Allergies:  Allergies  Allergen Reactions  . Ciprofloxacin Swelling and Other (See Comments)    Joint pain  . Tramadol Nausea And Vomiting    Had taken in past with no trouble but last prescription had nausea and vomiting  . Penicillins Swelling and Rash    Has patient had a PCN reaction causing immediate rash, facial/tongue/throat swelling, SOB or lightheadedness with hypotension: yes Has patient had a PCN reaction causing severe rash involving mucus membranes or skin necrosis: no Has patient had a PCN reaction that required hospitalization no Has patient had a PCN  reaction occurring within the last 10 years: no If all of the above answers are "NO", then may proceed with Cephalosporin use.     Medications reviewed.    ROS A multipoint ROS was completed   BP 124/77   Pulse 73   Temp 97.8 F (36.6 C) (Oral)   Wt 68.9 kg (152 lb)   BMI 27.80 kg/m   Physical Exam Gen: NAD Chest: CTA CV: RRR ABD: Soft, NT, ND. Loop ileostomy in RLQ that is functioning normally. Midline wound is now about 1.5cm in greatest dimension without evidence of infection.    No results  found for this or any previous visit (from the past 48 hour(s)). No results found.  Assessment/Plan:  1. Ileostomy in place [facility]) 71 year old female with ileostomy after closure of multiple defects. Doing well. Discussed signs of infection and to return immediately if they occur. Otherwise plan for barium enema in January with clinic follow up afterwords. We will plan ileostomy reversal after that study assuming it shows her anastamoses are well healed. - DG Colon W/Cm - Wo/W Kub; Future     Ricarda Frame, MD [facility] General Surgeon  10/12/2017,8:48 AM

## 2017-10-24 ENCOUNTER — Telehealth: Payer: Self-pay

## 2017-10-24 ENCOUNTER — Other Ambulatory Visit: Payer: Self-pay | Admitting: Infectious Diseases

## 2017-10-24 DIAGNOSIS — I824Z2 Acute embolism and thrombosis of unspecified deep veins of left distal lower extremity: Secondary | ICD-10-CM

## 2017-10-24 NOTE — Telephone Encounter (Signed)
-----   Message from [place], [place] sent at 10/11/2017  4:22 PM EST ----- Regarding: Son's address [address] [place], [place] [address]

## 2017-10-24 NOTE — Telephone Encounter (Signed)
I have mailed patient her Barium Enema instructions, date and time by mail requested by the patient.

## 2017-10-27 ENCOUNTER — Ambulatory Visit
Admission: RE | Admit: 2017-10-27 | Discharge: 2017-10-27 | Disposition: A | Discharge status: Home / Self Care | Payer: Medicare Other | Source: Ambulatory Visit | Attending: Infectious Diseases | Admitting: Infectious Diseases

## 2017-10-27 DIAGNOSIS — R936 Abnormal findings on diagnostic imaging of limbs: Secondary | ICD-10-CM | POA: Insufficient documentation

## 2017-10-27 DIAGNOSIS — I82512 Chronic embolism and thrombosis of left femoral vein: Secondary | ICD-10-CM | POA: Insufficient documentation

## 2017-10-27 DIAGNOSIS — E43 Unspecified severe protein-calorie malnutrition: Secondary | ICD-10-CM | POA: Diagnosis not present

## 2017-10-27 DIAGNOSIS — I824Z2 Acute embolism and thrombosis of unspecified deep veins of left distal lower extremity: Secondary | ICD-10-CM | POA: Diagnosis present

## 2017-10-27 DIAGNOSIS — I82432 Acute embolism and thrombosis of left popliteal vein: Secondary | ICD-10-CM | POA: Diagnosis not present

## 2017-11-08 ENCOUNTER — Telehealth: Payer: Self-pay

## 2017-11-08 ENCOUNTER — Other Ambulatory Visit: Payer: Self-pay

## 2017-11-08 ENCOUNTER — Inpatient Hospital Stay: Payer: Medicare Other | Attending: Oncology | Admitting: Oncology

## 2017-11-08 ENCOUNTER — Inpatient Hospital Stay: Payer: Medicare Other

## 2017-11-08 ENCOUNTER — Encounter: Payer: Self-pay | Admitting: Oncology

## 2017-11-08 VITALS — BP 123/75 | HR 68 | Temp 94.9°F | Resp 18 | Ht 66.14 in | Wt 149.7 lb

## 2017-11-08 DIAGNOSIS — D649 Anemia, unspecified: Secondary | ICD-10-CM

## 2017-11-08 DIAGNOSIS — Z881 Allergy status to other antibiotic agents status: Secondary | ICD-10-CM | POA: Insufficient documentation

## 2017-11-08 DIAGNOSIS — Z90710 Acquired absence of both cervix and uterus: Secondary | ICD-10-CM | POA: Diagnosis not present

## 2017-11-08 DIAGNOSIS — Z803 Family history of malignant neoplasm of breast: Secondary | ICD-10-CM | POA: Diagnosis not present

## 2017-11-08 DIAGNOSIS — Z8041 Family history of malignant neoplasm of ovary: Secondary | ICD-10-CM | POA: Insufficient documentation

## 2017-11-08 DIAGNOSIS — Z86718 Personal history of other venous thrombosis and embolism: Secondary | ICD-10-CM | POA: Diagnosis not present

## 2017-11-08 DIAGNOSIS — Z8042 Family history of malignant neoplasm of prostate: Secondary | ICD-10-CM

## 2017-11-08 DIAGNOSIS — M069 Rheumatoid arthritis, unspecified: Secondary | ICD-10-CM

## 2017-11-08 DIAGNOSIS — Z7901 Long term (current) use of anticoagulants: Secondary | ICD-10-CM | POA: Insufficient documentation

## 2017-11-08 DIAGNOSIS — I82512 Chronic embolism and thrombosis of left femoral vein: Secondary | ICD-10-CM

## 2017-11-08 DIAGNOSIS — Z8051 Family history of malignant neoplasm of kidney: Secondary | ICD-10-CM | POA: Insufficient documentation

## 2017-11-08 DIAGNOSIS — Z880 Allergy status to penicillin: Secondary | ICD-10-CM | POA: Insufficient documentation

## 2017-11-08 DIAGNOSIS — Z79899 Other long term (current) drug therapy: Secondary | ICD-10-CM | POA: Insufficient documentation

## 2017-11-08 DIAGNOSIS — Z87891 Personal history of nicotine dependence: Secondary | ICD-10-CM | POA: Diagnosis not present

## 2017-11-08 DIAGNOSIS — Z801 Family history of malignant neoplasm of trachea, bronchus and lung: Secondary | ICD-10-CM | POA: Diagnosis not present

## 2017-11-08 DIAGNOSIS — Z933 Colostomy status: Secondary | ICD-10-CM

## 2017-11-08 LAB — CBC WITH DIFFERENTIAL/PLATELET
BASOS ABS: 0 10*3/uL (ref 0–0.1)
BASOS PCT: 1 %
Eosinophils Absolute: 0.2 10*3/uL (ref 0–0.7)
Eosinophils Relative: 3 %
HEMATOCRIT: 33.2 % — AB (ref 35.0–47.0)
HEMOGLOBIN: 10.7 g/dL — AB (ref 12.0–16.0)
Lymphocytes Relative: 32 %
Lymphs Abs: 1.6 10*3/uL (ref 1.0–3.6)
MCH: 31.1 pg (ref 26.0–34.0)
MCHC: 32.3 g/dL (ref 32.0–36.0)
MCV: 96.1 fL (ref 80.0–100.0)
MONOS PCT: 8 %
Monocytes Absolute: 0.4 10*3/uL (ref 0.2–0.9)
NEUTROS ABS: 2.8 10*3/uL (ref 1.4–6.5)
NEUTROS PCT: 56 %
Platelets: 185 10*3/uL (ref 150–440)
RBC: 3.45 MIL/uL — ABNORMAL LOW (ref 3.80–5.20)
RDW: 16.4 % — ABNORMAL HIGH (ref 11.5–14.5)
WBC: 4.9 10*3/uL (ref 3.6–11.0)

## 2017-11-08 LAB — COMPREHENSIVE METABOLIC PANEL
ALK PHOS: 88 U/L (ref 38–126)
ALT: 12 U/L — ABNORMAL LOW (ref 14–54)
ANION GAP: 9 (ref 5–15)
AST: 23 U/L (ref 15–41)
Albumin: 3.4 g/dL — ABNORMAL LOW (ref 3.5–5.0)
BUN: 15 mg/dL (ref 6–20)
CALCIUM: 8.8 mg/dL — AB (ref 8.9–10.3)
CO2: 24 mmol/L (ref 22–32)
Chloride: 104 mmol/L (ref 101–111)
Creatinine, Ser: 1.09 mg/dL — ABNORMAL HIGH (ref 0.44–1.00)
GFR calc non Af Amer: 50 mL/min — ABNORMAL LOW (ref >60)
GFR, EST AFRICAN AMERICAN: 58 mL/min — AB (ref >60)
Glucose, Bld: 86 mg/dL (ref 65–99)
POTASSIUM: 4.2 mmol/L (ref 3.5–5.1)
SODIUM: 137 mmol/L (ref 135–145)
TOTAL PROTEIN: 7.7 g/dL (ref 6.5–8.1)
Total Bilirubin: 0.5 mg/dL (ref 0.3–1.2)

## 2017-11-08 LAB — VITAMIN B12: Vitamin B-12: 3734 pg/mL — ABNORMAL HIGH (ref 180–914)

## 2017-11-08 NOTE — Progress Notes (Signed)
Here for new pt evaluation . Living in [place]  w son since March 2018.

## 2017-11-08 NOTE — Progress Notes (Signed)
Hematology/Oncology Consult note [facility] [phone] [phone]   Patient Care Team: Leonel Ramsay, MD as PCP - General (Infectious Diseases)  REFERRING PROVIDER: Leonel Ramsay, MD CHIEF COMPLAINTS/PURPOSE OF CONSULTATION:  Evaluation of DVT  HISTORY OF PRESENTING ILLNESS:  Ruth Gray is a  71 y.o.  female with PMH listed below who was referred to me for evaluation of DVT. Patient had first episode of provoked proximal DVT of left lower extremity (occlusive thrombus throughout the left lower extremity.) in Dec 2017, after a prolonged hospital and rehab stay after partial colon resection and creation of end ileostomy due to perforated diverticulosis.  She was started on Eliquis for anticoagulation for 3 months and came off anticoagulation.  She lives with her son in [place], and found to have left lower extremity swelling again. On 02/25/2017, patient has repeat US venous lower extremity and was found to have recurrent occlusive thrombosis of the left common femoral vein and proximal left femoral vein  She was restarted on Eliquis again.  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 There is nonocclusive thrombus throughout the popliteal vein. Patient was referred to me for decision of anticoagulation duration.  Patient also has RA following with Dr.Kernodle, on MTX and folic acid treatment.  Patient reports tolerating anticoagulation without any bleeding events.   Review of Systems  Constitutional: Negative for chills, fever and malaise/fatigue.  HENT: Negative for hearing loss.   Eyes: Negative for blurred vision.  Respiratory: Negative for cough.   Cardiovascular: Negative for chest pain.  Gastrointestinal: Negative for heartburn and nausea.  Genitourinary: Negative for dysuria.  Musculoskeletal: Positive for joint  pain. Negative for myalgias.  Skin: Negative for rash.  Neurological: Negative for dizziness and headaches.  Endo/Heme/Allergies: Does not bruise/bleed easily.  Psychiatric/Behavioral: Negative for depression.    MEDICAL HISTORY:  Past Medical History:  Diagnosis Date  . Anemia   . Collagen vascular disease ([place])   . DVT (deep venous thrombosis) (HCC)    left leg  . Perforation of colon ([place]) 08/2016  . Rheumatoid arteritis     SURGICAL HISTORY: Past Surgical History:  Procedure Laterality Date  . ABDOMINAL HYSTERECTOMY    . CENTRAL VENOUS CATHETER INSERTION  09/21/2016   Procedure: INSERTION CENTRAL LINE ADULT;  Surgeon: Dia Crawford III, MD;  Location: [facility];  Service: General;;  . COLON RESECTION  09/21/2016   Procedure: COLON RESECTION- Ascending and Sigmoid;  Surgeon: Dia Crawford III, MD;  Location: [facility];  Service: General;;  . COLOSTOMY N/A 09/25/2016   Procedure: COLOSTOMY;  Surgeon: Dia Crawford III, MD;  Location: [facility];  Service: General;  Laterality: N/A;  . DEBRIDEMENT OF ABDOMINAL WALL ABSCESS N/A 12/03/2016   Procedure: DEBRIDEMENT OF ABDOMINAL WALL ABSCESS;  Surgeon: Jules Husbands, MD;  Location: [facility];  Service: General;  Laterality: N/A;  . FOOT SURGERY Left   . ILEOSTOMY  08/24/2017   Procedure: ILEOSTOMY Loop Creation;  Surgeon: Clayburn Pert, MD;  Location: [facility];  Service: General;;  . ILEOSTOMY CLOSURE N/A 08/24/2017   Procedure: ILEOSTOMY REVERSAL X3;  Surgeon: Clayburn Pert, MD;  Location: [facility];  Service: General;  Laterality: N/A;  . LAPAROTOMY N/A 09/21/2016   Procedure: EXPLORATORY LAPAROTOMY;  Surgeon: Dia Crawford III, MD;  Location: [facility];  Service: General;  Laterality: N/A;  . LAPAROTOMY N/A 09/25/2016   Procedure:  EXPLORATORY LAPAROTOMY and right colon resection;  Surgeon: Dia Crawford III, MD;  Location: [facility];  Service: General;  Laterality: N/A;  . LAPAROTOMY  08/24/2017   Procedure: EXPLORATORY LAPAROTOMY;  Surgeon: Clayburn Pert, MD;  Location: [facility];  Service: General;;  . REPLACEMENT TOTAL KNEE Left     SOCIAL HISTORY: Social History   Socioeconomic History  . Marital status: Widowed    Spouse name: Not on file  . Number of children: Not on file  . Years of education: Not on file  . Highest education level: Not on file  Social Needs  . Financial resource strain: Not on file  . Food insecurity - worry: Not on file  . Food insecurity - inability: Not on file  . Transportation needs - medical: Not on file  . Transportation needs - non-medical: Not on file  Occupational History  . Not on file  Tobacco Use  . Smoking status: Former Smoker    Packs/day: 0.50    Years: 35.00    Pack years: 17.50    Types: Cigarettes    Last attempt to quit: 07/15/2016    Years since quitting: 1.3  . Smokeless tobacco: Never Used  Substance and Sexual Activity  . Alcohol use: No  . Drug use: No  . Sexual activity: No  Other Topics Concern  . Not on file  Social History Narrative  . Not on file    FAMILY HISTORY: Family History  Problem Relation Age of Onset  . Breast cancer Other 36  . Prostate cancer Father   . Heart attack Father   . Ovarian cancer Sister   . Kidney cancer Brother   . Lung cancer Sister   . Kidney disease Mother     ALLERGIES:  is allergic to ciprofloxacin; tramadol; and penicillins.  MEDICATIONS:  Current Outpatient Medications  Medication Sig Dispense Refill  . Calcium Carb-Cholecalciferol (CALCIUM 600+D3 PO) Take 2 tablets by mouth daily.    . carbonyl iron (FEOSOL) 45 MG TABS tablet Take 45 mg by mouth every Monday, Wednesday, and Friday. In the morning.    Marland Kitchen ELIQUIS 5 MG TABS tablet TAKE 1 TABLET (5 MG TOTAL) BY MOUTH EVERY 12 (TWELVE) HOURS.  1  . folic acid (FOLVITE) 1 MG tablet Take 2 mg by mouth daily with breakfast.     . methotrexate 50 MG/2ML injection Inject 0.8 mL subcutaneously once a week on Sundays.    . metoprolol tartrate (LOPRESSOR) 25 MG tablet TAKE 1/2  TABLET (12.5 MG TOTAL) BY MOUTH 2 (TWO) TIMES DAILY.  11  . acetaminophen (TYLENOL) 500 MG tablet Take 1,000 mg by mouth every 8 (eight) hours as needed.      No current facility-administered medications for this visit.      PHYSICAL EXAMINATION: ECOG PERFORMANCE STATUS: 0 - Asymptomatic Vitals:   11/08/17 1051  BP: 123/75  Pulse: 68  Resp: 18  Temp: (!) 94.9 F (34.9 C)   Filed Weights   11/08/17 1051  Weight: 149 lb 11.2 oz (67.9 kg)    Physical Exam  Constitutional: She is oriented to person, place, and time and well-developed, well-nourished, and in no distress. No distress.  HENT:  Head: Normocephalic and atraumatic.  Eyes: EOM are normal. Pupils are equal, round, and reactive to light.  Pale conjuctivae  Neck: Normal range of motion. Neck supple. No JVD present.  Cardiovascular: Normal rate, regular rhythm and normal heart sounds. Exam reveals no friction rub.  Pulmonary/Chest: Effort normal and breath  sounds normal. No respiratory distress.  Abdominal: Bowel sounds are normal. She exhibits no distension.  + colostomy Ventral  hernia  Musculoskeletal: Normal range of motion. She exhibits no edema.  Lymphadenopathy:    She has no cervical adenopathy.  Neurological: She is alert and oriented to person, place, and time. She displays normal reflexes.  Skin: Skin is warm and dry.  Psychiatric: Affect normal.     LABORATORY DATA:  I have reviewed the data as listed Lab Results  Component Value Date   WBC 4.9 11/08/2017   HGB 10.7 (L) 11/08/2017   HCT 33.2 (L) 11/08/2017   MCV 96.1 11/08/2017   PLT 185 11/08/2017   Recent Labs    12/02/16 1315  08/25/17 0358 08/27/17 0513 08/28/17 0427 08/31/17 0505  NA 135   < > 134* 138 137  --   K 3.4*   < > 4.5 3.5 3.6  --   CL 102   < > 104 107 105  --   CO2 25   < > 24 27 28   --   GLUCOSE 119*   < > 158* 142* 126*  --   BUN 13   < > 20 14 14   --   CREATININE 0.94   < > 1.06* 1.17* 1.07* 0.99  CALCIUM 8.5*   < >  8.1* 8.3* 8.2*  --   GFRNONAA >60   < > 52* 46* 51* 56*  GFRAA >60   < > >60 53* 60* >60  PROT 7.9  --   --  5.8*  --   --   ALBUMIN 2.6*  --   --  2.1*  --   --   AST 30  --   --  18  --   --   ALT 10*  --   --  10*  --   --   ALKPHOS 74  --   --  54  --   --   BILITOT 0.9  --   --  0.9  --   --    < > = values in this interval not displayed.    RADIOGRAPHIC STUDIES: I have personally reviewed the radiological images as listed and agreed with the findings in the report. US venous lower extremity on 10/29/2016, 02/25/2017, 10/27/2017.   ASSESSMENT & PLAN:  1. Chronic deep vein thrombosis (DVT) of femoral vein of left lower extremity (HCC)   2. Anemia, unspecified type    # Discussed with patient and his son who accompanied patient today, that given that fact that patient has recurrent left lower extremity proximal DVT, and current persistent chronic appearance femoral DVT, she is at high risk of DVT recurrence, I recommend patient to have long term anticoagulation.  Patient may have upcoming procedure for colostomy reversal and recommend perioperative anticoagulation.  In the future, consider repeat ultrasound if no chronic appearance of DVT, consider low-dose Eliquis for maintenance long-term.  # Hypercoagulable workup: Discussed with patient that hypercoagulable workup at this point does not change her anticoagulation position. That may provide additional information. If she does carry mutation predispose her to be hypercoagulable states, her first relative may need to be screened. Patient agrees to plan and willing to proceed. We'll check factor V Leiden mutation, and a prothrombin mutation. Hold lupus antibody panel workup as Eliquis usually can complicate the results.  #Normocytic anemia, we'll check basic workup including CMP, CBC, B12, folate, iron panel.   All questions were answered. The patient knows to  call the clinic with any problems questions or concerns.  Return of visit:    Thank you for this kind referral and the opportunity to participate in the care of this patient. A copy of today's note is routed to referring provider    Earlie Server, MD, PhD Hematology Oncology [facility] Pager- [phone] 11/08/2017

## 2017-11-08 NOTE — Telephone Encounter (Signed)
I will send an order to [facility] health so they could continue patient's care. Hopefully this will help. If not, they will let me know.  Letter faxed to [facility] at [phone], Attention: Lurena Joiner.

## 2017-11-09 LAB — IRON AND TIBC
IRON: 55 ug/dL (ref 28–170)
SATURATION RATIOS: 16 % (ref 10.4–31.8)
TIBC: 339 ug/dL (ref 250–450)
UIBC: 284 ug/dL

## 2017-11-09 LAB — FERRITIN: FERRITIN: 62 ng/mL (ref 11–307)

## 2017-11-09 LAB — FOLATE

## 2017-11-14 LAB — PROTHROMBIN GENE MUTATION

## 2017-11-14 LAB — FACTOR 5 LEIDEN

## 2017-11-24 ENCOUNTER — Ambulatory Visit: Payer: Medicare Other

## 2017-11-24 ENCOUNTER — Telehealth: Payer: Self-pay | Admitting: General Surgery

## 2017-11-24 NOTE — Telephone Encounter (Signed)
Patient called and said her incision has completely closed, and it has a grayish- white color on the outside, no drainage. Patient is asking if this is normal. Please call patient and advise.

## 2017-11-24 NOTE — Telephone Encounter (Signed)
Spoke with patient. She states her wound has closed completely. No drainage. No pus. She does have some dry skin but no itching.  Denies redness, no fever. Doing well.  Patient instructed to wash with moisturizing soap to help hydrate the skin, and drink plenty of fluids.

## 2017-12-01 ENCOUNTER — Ambulatory Visit
Admission: RE | Admit: 2017-12-01 | Discharge: 2017-12-01 | Disposition: A | Discharge status: Home / Self Care | Payer: Medicare Other | Source: Ambulatory Visit | Attending: General Surgery | Admitting: General Surgery

## 2017-12-01 ENCOUNTER — Ambulatory Visit (INDEPENDENT_AMBULATORY_CARE_PROVIDER_SITE_OTHER): Payer: Medicare Other | Admitting: General Surgery

## 2017-12-01 ENCOUNTER — Encounter: Payer: Self-pay | Admitting: General Surgery

## 2017-12-01 VITALS — BP 111/72 | HR 85 | Temp 98.0°F | Ht 66.0 in | Wt 147.2 lb

## 2017-12-01 DIAGNOSIS — Z932 Ileostomy status: Secondary | ICD-10-CM | POA: Diagnosis not present

## 2017-12-01 DIAGNOSIS — K5730 Diverticulosis of large intestine without perforation or abscess without bleeding: Secondary | ICD-10-CM | POA: Insufficient documentation

## 2017-12-01 NOTE — Patient Instructions (Signed)
We have spoken today about reversing your Ostomy. You are requesting to have this done.  We will arrange this to be done on 12/07/2017 by Dr. Tonita Cong at [facility].   Plan on being in the hospital between 5-7 days after surgery. You will be started on a liquid diet and then advanced as tolerated prior to going home.  If you have any disability or FMLA paperwork that needs to be filled out for your employer, please bring this in prior to surgery and it will be filled out upon your discharge from the hospital. We can give you a note anticipating your surgery date. If your employer is in need of this, please let us know.  To prep for your surgery, You will need to complete a bowel prep, 2 antibiotics, and a fleets enema prior to surgery. Your antibiotics are Neomycin and Erythromycin and these will be taken at 8am, 2pm, 8pm on the day of your prep. (Please see the Bowel sheet provided)  Please see your [facility]) Pre-Care Sheet for more information. If you have any questions, please call our office and ask for a nurse.  End Ileostomy Reversal An end colostomy reversal is surgery that reverses an end colostomy. The large intestine is disconnected from the opening in the abdomen (stoma). Then it is reconnected to the large intestine inside the body. A stoma and pouch are no longer needed. Bowel movements can resume through the rectum. LET [facility] CARE PROVIDER KNOW ABOUT:  Allergies to food or medicine.  Medicines taken, including vitamins, health supplements, herbs, eye drops, over-the-counter medicines, and creams.  Use of steroids (by mouth or creams).  Previous problems with anesthetics or numbing medicines.  History of bleeding problems or blood clots.  Previous surgery.  Other health problems, including diabetes and kidney problems.  Possibility of pregnancy, if this applies. RISKS AND COMPLICATIONS General surgical complications may include the following:  Reaction to  anesthetics.  Damage to surrounding nerves, tissues, or structures.  Blood clot.  Bleeding.  Scarring. Specific risks for colostomy reversal, while rare, may include:  Intestinal paralysis (ileus). This is a normal part of recovery. It usually goes away in 3-7 days. However, it can last longer in some people.  Leaking at the joined part of the intestine (anastomotic leak).  Infection of the surgical cut (incision) or the place where the stoma was located.  A collection of pus (abscess) in the abdomen or pelvis.  Intestinal blockage.  Narrowing at the joined part of the intestine (stricture).  Urinary and sexual dysfunction. BEFORE THE PROCEDURE It is important to follow your health care provider's instructions prior to your procedure. This will help you to avoid complications. Steps before your procedure may include:  A physical exam, rectal exam, X-rays, colonoscopy, and other procedures.  Chemotherapy or radiation therapy, if the stoma was created due to cancer.  A review of the procedure, the anesthetic being used, and what to expect after the procedure. You may be asked to:  Stop taking certain medicines for several days prior to your procedure. These may include blood thinners (such as aspirin).  Take certain medicines, such as antibiotics or stool softeners.  Avoid eating and drinking after midnight the night before the procedure. This will help you to avoid complications from the anesthetic.  Quit smoking. Smoking increases the chances of a healing problem after your procedure. PROCEDURE You will be given medicine that makes you sleep (general anesthetic). The procedure may be done as open surgery, with a  large incision. It may also be done as laparoscopic surgery, with several smaller incisions. The surgeon will stitch or staple the intestine ends back together. This surgery takes several hours. AFTER THE PROCEDURE  You will be given pain medicine.  Slowly  increase your diet and movement as directed by your health care provider.  You should arrange for someone to help you with activities at home while you recover.   This information is not intended to replace advice given to you by your health care provider. Make sure you discuss any questions you have with your health care provider.   Document Released: 01/31/2012 Document Revised: 03/25/2015 Document Reviewed: 01/31/2012 Elsevier Interactive Patient Education [employer].

## 2017-12-01 NOTE — Progress Notes (Signed)
Outpatient Surgical Follow Up  12/01/2017  Ruth Gray is an 72 y.o. female.   Chief Complaint  Patient presents with  . Routine Post Op    Ileostomy takedown x3 loop ileostomy creation Dr.Brittane Grudzinski 08-24-17 Discuss barium enema results    HPI: 72 year old female returns to clinic for follow-up after a barium enema was performed this morning.  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  She reports that she is not having any pain.  She is eating well and having good ostomy output.  Midline wound is almost completely healed.  Her weight has been stable and she has had good energy.  She denies any recent sick contacts or changes in her health.  She denies any fevers, chills, nausea, vomiting, chest pain, shortness of breath.  Past Medical History:  Diagnosis Date  . Anemia   . Collagen vascular disease (HCC)   . DVT (deep venous thrombosis) (HCC)    left leg  . Perforation of colon (HCC) 08/2016  . Rheumatoid arteritis     Past Surgical History:  Procedure Laterality Date  . ABDOMINAL HYSTERECTOMY    . CENTRAL VENOUS CATHETER INSERTION  09/21/2016   Procedure: INSERTION CENTRAL LINE ADULT;  Surgeon: Tiney Rouge III, MD;  Location: [facility] ORS;  Service: General;;  . COLON RESECTION  09/21/2016   Procedure: COLON RESECTION- Ascending and Sigmoid;  Surgeon: Tiney Rouge III, MD;  Location: [facility] ORS;  Service: General;;  . COLOSTOMY N/A 09/25/2016   Procedure: COLOSTOMY;  Surgeon: Tiney Rouge III, MD;  Location: [facility] ORS;  Service: General;  Laterality: N/A;  . DEBRIDEMENT OF ABDOMINAL WALL ABSCESS N/A 12/03/2016   Procedure: DEBRIDEMENT OF ABDOMINAL WALL ABSCESS;  Surgeon: Leafy Ro, MD;  Location: [facility] ORS;  Service: General;  Laterality: N/A;  . FOOT SURGERY Left   . ILEOSTOMY  08/24/2017   Procedure: ILEOSTOMY Loop Creation;  Surgeon: Ricarda Frame, MD;  Location: [facility] ORS;  Service: General;;  . ILEOSTOMY CLOSURE N/A 08/24/2017   Procedure:  ILEOSTOMY REVERSAL X3;  Surgeon: Ricarda Frame, MD;  Location: [facility] ORS;  Service: General;  Laterality: N/A;  . LAPAROTOMY N/A 09/21/2016   Procedure: EXPLORATORY LAPAROTOMY;  Surgeon: Tiney Rouge III, MD;  Location: [facility] ORS;  Service: General;  Laterality: N/A;  . LAPAROTOMY N/A 09/25/2016   Procedure: EXPLORATORY LAPAROTOMY and right colon resection;  Surgeon: Tiney Rouge III, MD;  Location: [facility] ORS;  Service: General;  Laterality: N/A;  . LAPAROTOMY  08/24/2017   Procedure: EXPLORATORY LAPAROTOMY;  Surgeon: Ricarda Frame, MD;  Location: [facility] ORS;  Service: General;;  . REPLACEMENT TOTAL KNEE Left     Family History  Problem Relation Age of Onset  . Breast cancer Other 36  . Prostate cancer Father   . Heart attack Father   . Ovarian cancer Sister   . Kidney cancer Brother   . Lung cancer Sister   . Kidney disease Mother     Social History:  reports that she quit smoking about 16 months ago. Her smoking use included cigarettes. She has a 17.50 pack-year smoking history. she has never used smokeless tobacco. She reports that she does not drink alcohol or use drugs.  Allergies:  Allergies  Allergen Reactions  . Ciprofloxacin Swelling and Other (See Comments)    Joint pain  . Tramadol Nausea And Vomiting    Had taken in past with no trouble but last prescription had nausea and vomiting  . Penicillins Swelling and  Rash    Has patient had a PCN reaction causing immediate rash, facial/tongue/throat swelling, SOB or lightheadedness with hypotension: yes Has patient had a PCN reaction causing severe rash involving mucus membranes or skin necrosis: no Has patient had a PCN reaction that required hospitalization no Has patient had a PCN reaction occurring within the last 10 years: no If all of the above answers are "NO", then may proceed with Cephalosporin use.     Medications reviewed.    ROS A multipoint review of systems was completed, all pertinent positives and negatives  are documented within the HPI and the remainder are negative   BP 111/72   Pulse 85   Temp 98 F (36.7 C) (Oral)   Ht 5\' 6"  (1.676 m)   Wt 66.8 kg (147 lb 3.2 oz)   BMI 23.76 kg/m   Physical Exam General: No acute distress Neck: Supple nontender Chest: Clear to auscultation Heart: Regular rate and rhythm Abdomen: Soft, nontender, nondistended.  Right lower quadrant loop ileostomy present that is pink, patent, productive of gas and stool.  Midline infraumbilical wound now with a pinpoint opening.  No evidence of erythema or drainage. Extremities: Moves all extremities well.    No results found for this or any previous visit (from the past 48 hour(s)). Dg Colon W/cm - Wo/w Kub  Result Date: 12/01/2017 CLINICAL DATA:  Diverticulitis. EXAM: BE WITH CONTRAST - WITHOUT AND WITH KUB FLUOROSCOPY TIME:  Fluoroscopy Time:  6 minutes 12 seconds Radiation Exposure Index (if provided by the fluoroscopic device): 157.6 mGy Number of Acquired Spot Images: 14 COMPARISON:  CT 12/02/2016. FINDINGS: Barium was introduced per rectum to the what appears to be the distal right colon and terminal ileum. Visualized bowel is widely patent. No obstructing abnormality. Scattered diverticuli are noted throughout the entire colon. IMPRESSION: Wide patency of colon and terminal ileum. Scattered diverticuli noted throughout the remaining colon. Electronically Signed   By: 01/30/2017  Register   On: 12/01/2017 11:13    Assessment/Plan:  1. Ileostomy in place [facility]) 72 year old female status post loop ileostomy creation after multiple anastomoses were made distal.  Doing very well.  Barium enema results reviewed with the patient and her family.  Discussed that she is ready for her ileostomy takedown.  Discussed the procedure in detail to include the risk, benefits, alternatives.  They voiced understanding and desire to proceed.  Plan to proceed to the operating room with a loop ileostomy takedown for next Wednesday, 16  January.  Dr. [id number] will assist as he is the day call provider.  A total of 25 minutes was used on this encounter with greater than 50% of it used for counseling or coordination of care.   Excell Seltzer, MD FACS General Surgeon  12/01/2017,4:17 PM

## 2017-12-02 ENCOUNTER — Telehealth: Payer: Self-pay | Admitting: General Surgery

## 2017-12-02 NOTE — Telephone Encounter (Signed)
Patient called and asked if she needed to stop her eliquis, I spoke with Chanel and she said the patient needs to stop three days prior. I relayed the information to the patient given by the nurse.

## 2017-12-05 ENCOUNTER — Inpatient Hospital Stay: Admission: RE | Admit: 2017-12-05 | Payer: Medicare Other | Source: Ambulatory Visit

## 2017-12-05 ENCOUNTER — Telehealth: Payer: Self-pay | Admitting: General Surgery

## 2017-12-05 NOTE — Telephone Encounter (Signed)
Pt advised of pre op date/time and sx date. Sx: 12/07/17 with Dr Albertine Patricia ileostomy takedown.  Pre op: 12/06/17 @ 2:45pm--office pre op.   Patient made aware to call [phone], between 1-3:00pm the day before surgery, to find out what time to arrive.

## 2017-12-06 ENCOUNTER — Encounter
Admission: RE | Admit: 2017-12-06 | Discharge: 2017-12-06 | Disposition: A | Discharge status: Home / Self Care | Payer: Medicare Other | Source: Ambulatory Visit | Attending: General Surgery | Admitting: General Surgery

## 2017-12-06 ENCOUNTER — Other Ambulatory Visit: Payer: Self-pay

## 2017-12-06 ENCOUNTER — Telehealth: Payer: Self-pay

## 2017-12-06 MED ORDER — CLINDAMYCIN PHOSPHATE 900 MG/50ML IV SOLN
900.0000 mg | INTRAVENOUS | Status: AC
  Administered 2017-12-07: 900 mg via INTRAVENOUS

## 2017-12-06 MED ORDER — GENTAMICIN SULFATE 40 MG/ML IJ SOLN
5.0000 mg/kg | INTRAVENOUS | Status: AC
  Administered 2017-12-07: 330 mg via INTRAVENOUS
  Filled 2017-12-06: qty 8.25

## 2017-12-06 NOTE — Pre-Procedure Instructions (Addendum)
Spoke with Chanel at Dr. Talmage Coin office regarding pt's report of stopping Eliquis with last surgery of ostomy takedown and bridging with Lovenox at home, pt has stopped Eliquis on 12/04/2017 per Dr. Talmage Coin instructions and Lovenox has not been ordered.  Chanel spoke with Dr. Tonita Cong and a Lovenox bridge is not needed with this surgery.

## 2017-12-06 NOTE — Telephone Encounter (Signed)
Mindi Junker called and stated that she was concern about the patient needing a bridge from stopping her eliquis. I verbalized to her that I would have to give Dr. Tonita Cong a call and ask what he would advise for the patient.   Call made to Dr. Tonita Cong and I informed him of the concern for the bridge. He stated that she does not need a bridge at this time. I verbalized understanding.  Call Made back to pre-admit testing and verbalized to [facility] that Dr. Tonita Cong verbalized that the patient does not need a bridge at this time. She verbalized understanding at this time.

## 2017-12-06 NOTE — Patient Instructions (Addendum)
  Your procedure is scheduled CN:[id number] Jan. 16th , 2019. Report to Same Day Surgery at 2:00 PM.   Remember: Instructions that are not followed completely may result in serious medical risk, up to and including death, or upon the discretion of your surgeon and anesthesiologist your surgery may need to be rescheduled.    _x___ 1. Do not eat food after midnight night prior to surgery.     No gum chewing or hard candies, snacks or breakfast.     May drink the following, stop 2 hours prior to arrival to hospital:      Water     Clear apple juice     Black coffee or black tea     Gator aide    ____ 2. No Alcohol for 24 hours before or after surgery.   ____ 3. Bring all medications with you on the day of surgery if instructed.    __x__ 4. Notify your doctor if there is any change in your medical condition     (cold, fever, infections).    _____ 5.   Do Not Smoke or use e-cigarettes For 24 Hours Prior to Your   Surgery.  Do not use any chewable tobacco products for at least 6   hours prior to  surgery.                  Do not wear jewelry, make-up, hairpins, clips or nail polish.  Do not wear lotions, powders, or perfumes.   Do not shave 48 hours prior to surgery. Men may shave face and neck.  Do not bring valuables to the hospital.    [facility] is not responsible for any belongings or valuables.               Contacts, dentures or bridgework may not be worn into surgery.  Leave your suitcase in the car. After surgery it may be brought to your room.  For patients admitted to the hospital, discharge time is determined by your  treatment team.   Patients discharged the day of surgery will not be allowed to drive home.    Please read over the following fact sheets that you were given:   [facility] Preparing for Surgery             __x__ Take these medicines the morning of surgery with A SIP OF WATER:    1. Lopressor/Metoprolol      ____ Fleet Enema (as  directed)   __x__ Use CHG Soap as directed on instruction sheet  ____ Use inhalers on the day of surgery and bring to hospital day of surgery  ____ Stop metformin 2 days prior to surgery    ____ Take 1/2 of usual insulin dose the night before surgery and none on the morning of surgery.   _x___ Kriste Basque on Sunday Jan. 13 th, 2019 as instructed by Dr. Tonita Cong.  ____ Stop Anti-inflammatories such as Advil, Aleve, Ibuprofen, Motrin, Naproxen,  Naprosyn, Goodies powders or aspirin products. OK to take Tylenol.   ____ Stop supplements until after surgery.    ____ Bring C-Pap to the hospital.

## 2017-12-07 ENCOUNTER — Inpatient Hospital Stay: Payer: Medicare Other | Admitting: Certified Registered"

## 2017-12-07 ENCOUNTER — Inpatient Hospital Stay
Admission: RE | Admit: 2017-12-07 | Discharge: 2017-12-10 | DRG: 331 | Disposition: A | Discharge status: Home / Self Care | Payer: Medicare Other | Source: Ambulatory Visit | Attending: General Surgery | Admitting: General Surgery

## 2017-12-07 ENCOUNTER — Encounter: Payer: Self-pay | Admitting: Anesthesiology

## 2017-12-07 ENCOUNTER — Encounter
Admission: RE | Disposition: A | Discharge status: Home / Self Care | Payer: Self-pay | Source: Ambulatory Visit | Attending: General Surgery

## 2017-12-07 ENCOUNTER — Other Ambulatory Visit: Payer: Self-pay

## 2017-12-07 DIAGNOSIS — Z880 Allergy status to penicillin: Secondary | ICD-10-CM | POA: Diagnosis not present

## 2017-12-07 DIAGNOSIS — I2510 Atherosclerotic heart disease of native coronary artery without angina pectoris: Secondary | ICD-10-CM | POA: Diagnosis present

## 2017-12-07 DIAGNOSIS — Z79899 Other long term (current) drug therapy: Secondary | ICD-10-CM | POA: Diagnosis not present

## 2017-12-07 DIAGNOSIS — Z432 Encounter for attention to ileostomy: Principal | ICD-10-CM

## 2017-12-07 DIAGNOSIS — Z87891 Personal history of nicotine dependence: Secondary | ICD-10-CM | POA: Diagnosis not present

## 2017-12-07 DIAGNOSIS — I739 Peripheral vascular disease, unspecified: Secondary | ICD-10-CM | POA: Diagnosis present

## 2017-12-07 DIAGNOSIS — Z9889 Other specified postprocedural states: Secondary | ICD-10-CM | POA: Diagnosis not present

## 2017-12-07 DIAGNOSIS — Z885 Allergy status to narcotic agent status: Secondary | ICD-10-CM | POA: Diagnosis not present

## 2017-12-07 DIAGNOSIS — Z881 Allergy status to other antibiotic agents status: Secondary | ICD-10-CM

## 2017-12-07 HISTORY — PX: ILEOSTOMY CLOSURE: SHX1784

## 2017-12-07 LAB — CBC
HCT: 33.7 % — ABNORMAL LOW (ref 35.0–47.0)
HEMOGLOBIN: 11.1 g/dL — AB (ref 12.0–16.0)
MCH: 31.3 pg (ref 26.0–34.0)
MCHC: 32.9 g/dL (ref 32.0–36.0)
MCV: 94.9 fL (ref 80.0–100.0)
Platelets: 156 10*3/uL (ref 150–440)
RBC: 3.55 MIL/uL — ABNORMAL LOW (ref 3.80–5.20)
RDW: 16 % — ABNORMAL HIGH (ref 11.5–14.5)
WBC: 3.1 10*3/uL — ABNORMAL LOW (ref 3.6–11.0)

## 2017-12-07 LAB — CREATININE, SERUM
CREATININE: 1.01 mg/dL — AB (ref 0.44–1.00)
GFR calc Af Amer: >60 mL/min (ref >60)
GFR, EST NON AFRICAN AMERICAN: 55 mL/min — AB (ref >60)

## 2017-12-07 SURGERY — CLOSURE, ILEOSTOMY
Anesthesia: General | Site: Abdomen | Wound class: Clean Contaminated

## 2017-12-07 MED ORDER — PROPOFOL 10 MG/ML IV BOLUS
INTRAVENOUS | Status: AC
  Filled 2017-12-07: qty 20

## 2017-12-07 MED ORDER — OXYCODONE-ACETAMINOPHEN 5-325 MG PO TABS
1.0000 | ORAL_TABLET | ORAL | Status: DC | PRN
  Administered 2017-12-07: 1 via ORAL
  Administered 2017-12-08 – 2017-12-10 (×6): 2 via ORAL
  Filled 2017-12-07: qty 1
  Filled 2017-12-07 (×6): qty 2

## 2017-12-07 MED ORDER — CLINDAMYCIN PHOSPHATE 900 MG/50ML IV SOLN
INTRAVENOUS | Status: AC
  Filled 2017-12-07: qty 50

## 2017-12-07 MED ORDER — ONDANSETRON HCL 4 MG/2ML IJ SOLN: 4.0000 mg | Freq: Four times a day (QID) | INTRAMUSCULAR | Status: DC | PRN

## 2017-12-07 MED ORDER — MIDAZOLAM HCL 2 MG/2ML IJ SOLN
INTRAMUSCULAR | Status: DC | PRN
  Administered 2017-12-07: 2 mg via INTRAVENOUS

## 2017-12-07 MED ORDER — ROCURONIUM BROMIDE 50 MG/5ML IV SOLN
INTRAVENOUS | Status: AC
  Filled 2017-12-07: qty 1

## 2017-12-07 MED ORDER — MIDAZOLAM HCL 2 MG/2ML IJ SOLN
INTRAMUSCULAR | Status: AC
  Filled 2017-12-07: qty 2

## 2017-12-07 MED ORDER — ROCURONIUM BROMIDE 100 MG/10ML IV SOLN
INTRAVENOUS | Status: DC | PRN
  Administered 2017-12-07: 30 mg via INTRAVENOUS

## 2017-12-07 MED ORDER — ONDANSETRON 4 MG PO TBDP
4.0000 mg | ORAL_TABLET | Freq: Four times a day (QID) | ORAL | Status: DC | PRN
Start: 2017-12-07 — End: 2017-12-10

## 2017-12-07 MED ORDER — HYDRALAZINE HCL 20 MG/ML IJ SOLN: 10.0000 mg | INTRAMUSCULAR | Status: DC | PRN

## 2017-12-07 MED ORDER — SUCCINYLCHOLINE CHLORIDE 20 MG/ML IJ SOLN
INTRAMUSCULAR | Status: DC | PRN
  Administered 2017-12-07: 80 mg via INTRAVENOUS

## 2017-12-07 MED ORDER — ENOXAPARIN SODIUM 40 MG/0.4ML ~~LOC~~ SOLN
40.0000 mg | SUBCUTANEOUS | Status: DC
  Administered 2017-12-08 – 2017-12-09 (×2): 40 mg via SUBCUTANEOUS
  Filled 2017-12-07 (×2): qty 0.4

## 2017-12-07 MED ORDER — MORPHINE SULFATE (PF) 2 MG/ML IV SOLN
2.0000 mg | INTRAVENOUS | Status: DC | PRN
  Administered 2017-12-07: 2 mg via INTRAVENOUS
  Filled 2017-12-07: qty 1

## 2017-12-07 MED ORDER — LACTATED RINGERS IV SOLN
INTRAVENOUS | Status: DC
  Administered 2017-12-07: 14:00:00 via INTRAVENOUS

## 2017-12-07 MED ORDER — SUCCINYLCHOLINE CHLORIDE 20 MG/ML IJ SOLN
INTRAMUSCULAR | Status: AC
  Filled 2017-12-07: qty 1

## 2017-12-07 MED ORDER — CHLORHEXIDINE GLUCONATE CLOTH 2 % EX PADS: 6.0000 | MEDICATED_PAD | Freq: Once | CUTANEOUS | Status: DC

## 2017-12-07 MED ORDER — GLYCOPYRROLATE 0.2 MG/ML IJ SOLN
INTRAMUSCULAR | Status: AC
  Filled 2017-12-07: qty 1

## 2017-12-07 MED ORDER — FENTANYL CITRATE (PF) 100 MCG/2ML IJ SOLN
INTRAMUSCULAR | Status: DC | PRN
  Administered 2017-12-07 (×2): 50 ug via INTRAVENOUS

## 2017-12-07 MED ORDER — METOPROLOL TARTRATE 25 MG PO TABS
12.5000 mg | ORAL_TABLET | Freq: Two times a day (BID) | ORAL | Status: DC
  Administered 2017-12-07 – 2017-12-10 (×5): 12.5 mg via ORAL
  Filled 2017-12-07 (×6): qty 1

## 2017-12-07 MED ORDER — ACETAMINOPHEN 10 MG/ML IV SOLN
INTRAVENOUS | Status: AC
  Filled 2017-12-07: qty 100

## 2017-12-07 MED ORDER — SUGAMMADEX SODIUM 200 MG/2ML IV SOLN
INTRAVENOUS | Status: DC | PRN
  Administered 2017-12-07: 150 mg via INTRAVENOUS

## 2017-12-07 MED ORDER — LIDOCAINE HCL (PF) 2 % IJ SOLN
INTRAMUSCULAR | Status: AC
  Filled 2017-12-07: qty 10

## 2017-12-07 MED ORDER — ONDANSETRON HCL 4 MG/2ML IJ SOLN
INTRAMUSCULAR | Status: DC | PRN
  Administered 2017-12-07: 4 mg via INTRAVENOUS

## 2017-12-07 MED ORDER — PANTOPRAZOLE SODIUM 40 MG IV SOLR
40.0000 mg | Freq: Every day | INTRAVENOUS | Status: DC
  Administered 2017-12-07 – 2017-12-09 (×3): 40 mg via INTRAVENOUS
  Filled 2017-12-07 (×4): qty 40

## 2017-12-07 MED ORDER — GLYCOPYRROLATE 0.2 MG/ML IJ SOLN
INTRAMUSCULAR | Status: DC | PRN
  Administered 2017-12-07: 0.2 mg via INTRAVENOUS

## 2017-12-07 MED ORDER — ONDANSETRON HCL 4 MG/2ML IJ SOLN
INTRAMUSCULAR | Status: AC
  Filled 2017-12-07: qty 2

## 2017-12-07 MED ORDER — FENTANYL CITRATE (PF) 100 MCG/2ML IJ SOLN
INTRAMUSCULAR | Status: AC
  Filled 2017-12-07: qty 2

## 2017-12-07 MED ORDER — BUPIVACAINE HCL (PF) 0.5 % IJ SOLN
INTRAMUSCULAR | Status: AC
  Filled 2017-12-07: qty 30

## 2017-12-07 MED ORDER — FENTANYL CITRATE (PF) 100 MCG/2ML IJ SOLN: 25.0000 ug | INTRAMUSCULAR | Status: DC | PRN

## 2017-12-07 MED ORDER — DEXAMETHASONE SODIUM PHOSPHATE 10 MG/ML IJ SOLN
INTRAMUSCULAR | Status: DC | PRN
  Administered 2017-12-07: 10 mg via INTRAVENOUS

## 2017-12-07 MED ORDER — DIPHENHYDRAMINE HCL 50 MG/ML IJ SOLN: 12.5000 mg | Freq: Four times a day (QID) | INTRAMUSCULAR | Status: DC | PRN

## 2017-12-07 MED ORDER — LIDOCAINE HCL (CARDIAC) 20 MG/ML IV SOLN
INTRAVENOUS | Status: DC | PRN
  Administered 2017-12-07: 50 mg via INTRAVENOUS

## 2017-12-07 MED ORDER — BUPIVACAINE HCL (PF) 0.5 % IJ SOLN
INTRAMUSCULAR | Status: DC | PRN
  Administered 2017-12-07 (×2): 10 mL

## 2017-12-07 MED ORDER — DIPHENHYDRAMINE HCL 12.5 MG/5ML PO ELIX
12.5000 mg | ORAL_SOLUTION | Freq: Four times a day (QID) | ORAL | Status: DC | PRN
  Filled 2017-12-07: qty 5

## 2017-12-07 MED ORDER — ONDANSETRON HCL 4 MG/2ML IJ SOLN: 4.0000 mg | Freq: Once | INTRAMUSCULAR | Status: DC | PRN

## 2017-12-07 MED ORDER — METOPROLOL TARTRATE 5 MG/5ML IV SOLN: 5.0000 mg | Freq: Four times a day (QID) | INTRAVENOUS | Status: DC | PRN

## 2017-12-07 MED ORDER — ACETAMINOPHEN 10 MG/ML IV SOLN
INTRAVENOUS | Status: DC | PRN
  Administered 2017-12-07: 1000 mg via INTRAVENOUS

## 2017-12-07 MED ORDER — PROPOFOL 10 MG/ML IV BOLUS
INTRAVENOUS | Status: DC | PRN
  Administered 2017-12-07: 120 mg via INTRAVENOUS

## 2017-12-07 MED ORDER — SUGAMMADEX SODIUM 200 MG/2ML IV SOLN
INTRAVENOUS | Status: AC
  Filled 2017-12-07: qty 2

## 2017-12-07 MED ORDER — DEXAMETHASONE SODIUM PHOSPHATE 10 MG/ML IJ SOLN
INTRAMUSCULAR | Status: AC
  Filled 2017-12-07: qty 1

## 2017-12-07 MED ORDER — LACTATED RINGERS IV SOLN
INTRAVENOUS | Status: DC
  Administered 2017-12-07 – 2017-12-08 (×3): via INTRAVENOUS

## 2017-12-07 SURGICAL SUPPLY — 31 items
CANISTER SUCT 1200ML W/VALVE (MISCELLANEOUS) ×3 IMPLANT
CHLORAPREP W/TINT 26ML (MISCELLANEOUS) ×3 IMPLANT
DRAIN PENROSE 1/4X12 LTX (DRAIN) ×2 IMPLANT
DRAPE LAPAROTOMY 100X77 ABD (DRAPES) ×3 IMPLANT
DRSG OPSITE POSTOP 4X6 (GAUZE/BANDAGES/DRESSINGS) ×2 IMPLANT
ELECT BLADE 6.5 EXT (BLADE) ×2 IMPLANT
ELECT CAUTERY BLADE 6.4 (BLADE) ×3 IMPLANT
ELECT REM PT RETURN 9FT ADLT (ELECTROSURGICAL) ×3
ELECTRODE REM PT RTRN 9FT ADLT (ELECTROSURGICAL) ×1 IMPLANT
GAUZE SPONGE 4X4 12PLY STRL (GAUZE/BANDAGES/DRESSINGS) ×3 IMPLANT
GLOVE BIO SURGEON STRL SZ7.5 (GLOVE) ×6 IMPLANT
GLOVE INDICATOR 8.0 STRL GRN (GLOVE) ×7 IMPLANT
GOWN STRL REUS W/ TWL LRG LVL3 (GOWN DISPOSABLE) ×1 IMPLANT
GOWN STRL REUS W/ TWL XL LVL3 (GOWN DISPOSABLE) ×1 IMPLANT
GOWN STRL REUS W/TWL LRG LVL3 (GOWN DISPOSABLE) ×3
GOWN STRL REUS W/TWL XL LVL3 (GOWN DISPOSABLE) ×3
HOLDER FOLEY CATH W/STRAP (MISCELLANEOUS) IMPLANT
LABEL OR SOLS (LABEL) ×3 IMPLANT
NEEDLE HYPO 22GX1.5 SAFETY (NEEDLE) ×3 IMPLANT
NS IRRIG 1000ML POUR BTL (IV SOLUTION) ×3 IMPLANT
PACK BASIN MAJOR ARMC (MISCELLANEOUS) ×3 IMPLANT
PAD ABD DERMACEA PRESS 5X9 (GAUZE/BANDAGES/DRESSINGS) ×6 IMPLANT
RELOAD PROXIMATE 75MM BLUE (ENDOMECHANICALS) ×3 IMPLANT
RELOAD STAPLE 75 3.8 BLU REG (ENDOMECHANICALS) IMPLANT
STAPLER PROXIMATE 75MM BLUE (STAPLE) ×2 IMPLANT
STAPLER SKIN PROX 35W (STAPLE) ×3 IMPLANT
SUT PDS #1 CTX NDL (SUTURE) ×6 IMPLANT
SUT SILK 2 0 SH (SUTURE) ×3 IMPLANT
SUT SILK 3-0 (SUTURE) ×5 IMPLANT
SYR 10ML LL (SYRINGE) ×3 IMPLANT
TRAY FOLEY W/METER SILVER 16FR (SET/KITS/TRAYS/PACK) IMPLANT

## 2017-12-07 NOTE — Op Note (Signed)
   Pre-operative Diagnosis: Loop ileostomy in place  Post-operative Diagnosis: Same  Procedure performed: Loop ileostomy takedown  Surgeon: Ricarda Frame   Assistants: Dr. Dionne Milo  Anesthesia: General endotracheal anesthesia  ASA Class: 2  Surgeon: Ricarda Frame, MD FACS  Anesthesia: Gen. with endotracheal tube  Assistant: As above  Procedure Details  The patient was seen again in the Holding Room. The benefits, complications, treatment options, and expected outcomes were discussed with the patient. The risks of bleeding, infection, recurrence of symptoms, failure to resolve symptoms,  bowel injury, any of which could require further surgery were reviewed with the patient.   The patient was taken to Operating Room, identified as [facility] and the procedure verified.  A Time Out was held and the above information confirmed.  Prior to the induction of general anesthesia, antibiotic prophylaxis was administered. VTE prophylaxis was in place. General endotracheal anesthesia was then administered and tolerated well. After the induction, the area around the ostomy was prepped with Betadine and the remainder of the abdomen was prepped with Chloraprep and draped in the sterile fashion. The patient was positioned in the supine position.  The procedure began by localizing the skin around the ostomy site with a 50-50 mixture 1% lidocaine 0.5% Marcaine plain.  The skin was then incised in elliptical fashion around the ostomy with a 10 blade scalpel.  Using Bovie electrocautery this is dissected down to the level of the fascia.  The fascia was circumferentially freed up from the bowel until the bowel was freely mobile.  At this point the mesentery between loops was dissected free.  The area of the bowel that was adjacent to the skin was then sharply excised with Metzenbaum scissors.  Hemostasis was ensured with directed electrocautery.  Using a 75 mm blue load GIA stapler the 2 loops of  small bowel were reanastomosed.  An additional fire of the aforementioned stapler closed the enterotomy.  The staple line was then everted with interrupted 3-0 silk sutures.  Dr. Excell Seltzer assisted in retracting the fascia and maintaining control of the bowel during the formation of the anastomosis.  The bowel was then returned to the abdominal cavity without any difficulty.  The fascia was closed with an interrupted #1 PDS suture.  The entire field was then re-localized with the aforementioned local anesthetic.  A Penrose drain was placed in the sub-continues tissues and the skin was reapproximated with staples.  The patient tolerated the procedure well.  There were no immediate comp occasions.  All counts are correct at the end the procedure.  She was awoken from general anesthesia and transferred to the PACU in good condition.  Findings: Loop ileostomy   Estimated Blood Loss: 20 mL         Drains: Penrose drain and subcutaneous tissues         Specimens: None          Complications: None                  Condition: Good   Ricarda Frame, MD, FACS

## 2017-12-07 NOTE — Anesthesia Post-op Follow-up Note (Signed)
Anesthesia QCDR form completed.        

## 2017-12-07 NOTE — Brief Op Note (Signed)
12/07/2017  4:26 PM  PATIENT:  Ruth Gray  72 y.o. female  PRE-OPERATIVE DIAGNOSIS:  ILEOSTOMY IN PLACE  POST-OPERATIVE DIAGNOSIS:  ILEOSTOMY IN PLACE  PROCEDURE:  Procedure(s): ILEOSTOMY TAKEDOWN (N/A)  SURGEON:  Surgeon(s) and Role:    * Ricarda Frame, MD - Primary    * [name], MD - Assisting  PHYSICIAN ASSISTANT:   ASSISTANTS: none   ANESTHESIA:   general  EBL:  10 mL   BLOOD ADMINISTERED:none  DRAINS: Penrose drain in the prior ostomy site   LOCAL MEDICATIONS USED:  MARCAINE   , XYLOCAINE  and Amount: 20 ml  SPECIMEN:  No Specimen  DISPOSITION OF SPECIMEN:  N/A  COUNTS:  YES  TOURNIQUET:  * No tourniquets in log *  DICTATION: .Dragon Dictation  PLAN OF CARE: Admit to inpatient   PATIENT DISPOSITION:  PACU - hemodynamically stable.   Delay start of Pharmacological VTE agent (>24hrs) due to surgical blood loss or risk of bleeding: no

## 2017-12-07 NOTE — Transfer of Care (Signed)
Immediate Anesthesia Transfer of Care Note  Patient: Ruth Gray  Procedure(s) Performed: ILEOSTOMY TAKEDOWN (N/A Abdomen)  Patient Location: PACU  Anesthesia Type:General  Level of Consciousness: sedated  Airway & Oxygen Therapy: Patient Spontanous Breathing and Patient connected to face mask oxygen  Post-op Assessment: Report given to RN and Post -op Vital signs reviewed and stable  Post vital signs: Reviewed  Last Vitals:  Vitals:   12/07/17 1627 12/07/17 1628  BP: (!) 141/88 (!) 141/88  Pulse: 92 91  Resp: 15 12  Temp: 36.5 C (!) 36.4 C  SpO2: 100% 100%    Last Pain:  Vitals:   12/07/17 1352  TempSrc: Oral         Complications: No apparent anesthesia complications

## 2017-12-07 NOTE — Anesthesia Postprocedure Evaluation (Signed)
Anesthesia Post Note  Patient: Ruth Gray  Procedure(s) Performed: ILEOSTOMY TAKEDOWN (N/A Abdomen)  Patient location during evaluation: PACU Anesthesia Type: General Level of consciousness: awake and alert Pain management: pain level controlled Vital Signs Assessment: post-procedure vital signs reviewed and stable Respiratory status: spontaneous breathing, nonlabored ventilation, respiratory function stable and patient connected to nasal cannula oxygen Cardiovascular status: blood pressure returned to baseline and stable Postop Assessment: no apparent nausea or vomiting Anesthetic complications: no     Last Vitals:  Vitals:   12/07/17 1657 12/07/17 1712  BP: (!) 143/84 (!) 145/84  Pulse: 77 77  Resp: 11 10  Temp:  36.4 C  SpO2: 100% 100%    Last Pain:  Vitals:   12/07/17 1627  TempSrc:   PainSc: Asleep                 Cleda Mccreedy Piscitello

## 2017-12-07 NOTE — Progress Notes (Signed)
Preoperative Review   At Dr. Reginal Lutes request patient is met in the preoperative holding area. The history is reviewed in the chart and with the patient. I personally reviewed the options and rationale as well as the risks of this procedure that have been previously discussed with the patient. All questions asked by the patient and/or family were answered to their satisfaction.  Patient agrees to proceed with this procedure at this time.  Florene Glen M.D. FACS

## 2017-12-07 NOTE — Anesthesia Preprocedure Evaluation (Addendum)
Anesthesia Evaluation  Patient identified by MRN, date of birth, ID band Patient awake    Reviewed: Allergy & Precautions, NPO status , Patient's Chart, lab work & pertinent test results, reviewed documented beta blocker date and time   Airway Mallampati: II  TM Distance: >3 FB     Dental  (+) Chipped, Upper Dentures, Partial Lower   Pulmonary former smoker,           Cardiovascular + CAD and + Peripheral Vascular Disease       Neuro/Psych    GI/Hepatic   Endo/Other    Renal/GU      Musculoskeletal  (+) Arthritis , Rheumatoid disorders,    Abdominal   Peds  Hematology  (+) anemia ,   Anesthesia Other Findings Takes B-blocker for tachycardia.  Reproductive/Obstetrics                           Anesthesia Physical Anesthesia Plan  ASA: III  Anesthesia Plan: General   Post-op Pain Management:    Induction: Intravenous  PONV Risk Score and Plan:   Airway Management Planned: Oral ETT  Additional Equipment:   Intra-op Plan:   Post-operative Plan:   Informed Consent: I have reviewed the patients History and Physical, chart, labs and discussed the procedure including the risks, benefits and alternatives for the proposed anesthesia with the patient or authorized representative who has indicated his/her understanding and acceptance.     Plan Discussed with: CRNA  Anesthesia Plan Comments:         Anesthesia Quick Evaluation

## 2017-12-07 NOTE — Anesthesia Procedure Notes (Signed)
Procedure Name: Intubation Performed by: Sherie Dobrowolski, CRNA Pre-anesthesia Checklist: Patient identified, Patient being monitored, Timeout performed, Emergency Drugs available and Suction available Patient Re-evaluated:Patient Re-evaluated prior to induction Oxygen Delivery Method: Circle system utilized Preoxygenation: Pre-oxygenation with 100% oxygen Induction Type: IV induction Ventilation: Mask ventilation without difficulty Laryngoscope Size: Miller and 2 Grade View: Grade I Tube type: Oral Tube size: 7.0 mm Number of attempts: 1 Placement Confirmation: ETT inserted through vocal cords under direct vision,  positive ETCO2 and breath sounds checked- equal and bilateral Secured at: 21 cm Tube secured with: Tape Dental Injury: Teeth and Oropharynx as per pre-operative assessment        

## 2017-12-08 ENCOUNTER — Encounter: Payer: Self-pay | Admitting: General Surgery

## 2017-12-08 LAB — BASIC METABOLIC PANEL
ANION GAP: 8 (ref 5–15)
BUN: 14 mg/dL (ref 6–20)
CALCIUM: 8.3 mg/dL — AB (ref 8.9–10.3)
CO2: 22 mmol/L (ref 22–32)
Chloride: 103 mmol/L (ref 101–111)
Creatinine, Ser: 1.07 mg/dL — ABNORMAL HIGH (ref 0.44–1.00)
GFR, EST AFRICAN AMERICAN: 60 mL/min — AB (ref >60)
GFR, EST NON AFRICAN AMERICAN: 51 mL/min — AB (ref >60)
Glucose, Bld: 120 mg/dL — ABNORMAL HIGH (ref 65–99)
POTASSIUM: 4.7 mmol/L (ref 3.5–5.1)
Sodium: 133 mmol/L — ABNORMAL LOW (ref 135–145)

## 2017-12-08 LAB — CBC
HEMATOCRIT: 30.5 % — AB (ref 35.0–47.0)
Hemoglobin: 10.1 g/dL — ABNORMAL LOW (ref 12.0–16.0)
MCH: 31 pg (ref 26.0–34.0)
MCHC: 33.1 g/dL (ref 32.0–36.0)
MCV: 93.6 fL (ref 80.0–100.0)
PLATELETS: 176 10*3/uL (ref 150–440)
RBC: 3.25 MIL/uL — AB (ref 3.80–5.20)
RDW: 16.1 % — AB (ref 11.5–14.5)
WBC: 6.7 10*3/uL (ref 3.6–11.0)

## 2017-12-08 NOTE — Progress Notes (Signed)
1 Day Post-Op   Subjective: Patient reports she did well overnight.  Had a few episodes of pain but states her pain is now currently controlled.  She has been out of bed.  She denies any current bowel function.  Vital signs in last 24 hours: Temp:  [97.5 F (36.4 C)-97.7 F (36.5 C)] 97.5 F (36.4 C) (01/17 0818) Pulse Rate:  [50-92] 50 (01/17 0818) Resp:  [10-20] 12 (01/17 0818) BP: (99-153)/(54-88) 99/54 (01/17 0818) SpO2:  [99 %-100 %] 100 % (01/17 0818) Weight:  [66.7 kg (147 lb)] 66.7 kg (147 lb) (01/16 1352) Last BM Date: 12/07/17  Intake/Output from previous day: 01/16 0701 - 01/17 0700 In: 700 [I.V.:700] Out: 505 [Urine:500; Blood:5]  GI: Abdomen is soft, appropriately tender to palpation at the incision site, nondistended.  Honeycomb dressing in place with some serosanguineous output.  No evidence of purulence or peritonitis.  Lab Results:  CBC Recent Labs    12/07/17 1835 12/08/17 0509  WBC 3.1* 6.7  HGB 11.1* 10.1*  HCT 33.7* 30.5*  PLT 156 176   CMP     Component Value Date/Time   NA 133 (L) 12/08/2017 0509   K 4.7 12/08/2017 0509   CL 103 12/08/2017 0509   CO2 22 12/08/2017 0509   GLUCOSE 120 (H) 12/08/2017 0509   BUN 14 12/08/2017 0509   CREATININE 1.07 (H) 12/08/2017 0509   CALCIUM 8.3 (L) 12/08/2017 0509   PROT 7.7 11/08/2017 1210   ALBUMIN 3.4 (L) 11/08/2017 1210   AST 23 11/08/2017 1210   ALT 12 (L) 11/08/2017 1210   ALKPHOS 88 11/08/2017 1210   BILITOT 0.5 11/08/2017 1210   GFRNONAA 51 (L) 12/08/2017 0509   GFRAA 60 (L) 12/08/2017 0509   PT/INR No results for input(s): LABPROT, INR in the last 72 hours.  Studies/Results: No results found.  Assessment/Plan: 72 year old female status post loop ileostomy takedown.  Encourage ambulation and incentive spirometer usage.  Continue clear liquids until evidence of bowel function.  Once she passes flatus we will stop her IV fluids.   Ricarda Frame, MD [facility] General Surgeon [facility]  Day [facility] [phone] Night [facility] [phone]  12/08/2017

## 2017-12-09 LAB — BASIC METABOLIC PANEL
Anion gap: 7 (ref 5–15)
BUN: 10 mg/dL (ref 6–20)
CO2: 24 mmol/L (ref 22–32)
CREATININE: 1.09 mg/dL — AB (ref 0.44–1.00)
Calcium: 8.2 mg/dL — ABNORMAL LOW (ref 8.9–10.3)
Chloride: 104 mmol/L (ref 101–111)
GFR calc Af Amer: 58 mL/min — ABNORMAL LOW (ref >60)
GFR, EST NON AFRICAN AMERICAN: 50 mL/min — AB (ref >60)
GLUCOSE: 94 mg/dL (ref 65–99)
Potassium: 3.7 mmol/L (ref 3.5–5.1)
Sodium: 135 mmol/L (ref 135–145)

## 2017-12-09 LAB — CBC
HCT: 31.2 % — ABNORMAL LOW (ref 35.0–47.0)
Hemoglobin: 10.4 g/dL — ABNORMAL LOW (ref 12.0–16.0)
MCH: 31.4 pg (ref 26.0–34.0)
MCHC: 33.4 g/dL (ref 32.0–36.0)
MCV: 94.1 fL (ref 80.0–100.0)
PLATELETS: 203 10*3/uL (ref 150–440)
RBC: 3.32 MIL/uL — ABNORMAL LOW (ref 3.80–5.20)
RDW: 16 % — AB (ref 11.5–14.5)
WBC: 5.4 10*3/uL (ref 3.6–11.0)

## 2017-12-09 MED ORDER — APIXABAN 5 MG PO TABS
5.0000 mg | ORAL_TABLET | Freq: Two times a day (BID) | ORAL | Status: DC
  Administered 2017-12-09 – 2017-12-10 (×2): 5 mg via ORAL
  Filled 2017-12-09 (×2): qty 1

## 2017-12-09 NOTE — Care Management Important Message (Signed)
Important Message  Patient Details  Name: Ruth Gray MRN: [id number] Date of Birth: December 05, 1945   Medicare Important Message Given:  Yes    Chapman Fitch, RN 12/09/2017, 5:12 PM

## 2017-12-09 NOTE — Progress Notes (Signed)
2 Days Post-Op   Subjective: Patient had uneventful night.  She had a small bloody bowel movement overnight.  She has had some nausea but no vomiting.  She is otherwise tolerating a diet.  She has been ambulating well and her pain is been well controlled.  Vital signs in last 24 hours: Temp:  [98 F (36.7 C)-98.6 F (37 C)] 98 F (36.7 C) (01/18 0833) Pulse Rate:  [70-84] 84 (01/18 0833) Resp:  [16] 16 (01/18 0833) BP: (97-138)/(55-71) 138/71 (01/18 0833) SpO2:  [98 %-100 %] 100 % (01/18 0833) Last BM Date: 12/07/17  Intake/Output from previous day: 01/17 0701 - 01/18 0700 In: 3175 [P.O.:900; I.V.:2275] Out: 1000 [Urine:1000]  GI: Abdomen is soft, nontender, nondistended.  Right lower quadrant staple line intact with a Penrose drain.  Dressing change today which shows the skin is well approximated without any evidence of infection.  Lab Results:  CBC Recent Labs    12/08/17 0509 12/09/17 0432  WBC 6.7 5.4  HGB 10.1* 10.4*  HCT 30.5* 31.2*  PLT 176 203   CMP     Component Value Date/Time   NA 135 12/09/2017 0432   K 3.7 12/09/2017 0432   CL 104 12/09/2017 0432   CO2 24 12/09/2017 0432   GLUCOSE 94 12/09/2017 0432   BUN 10 12/09/2017 0432   CREATININE 1.09 (H) 12/09/2017 0432   CALCIUM 8.2 (L) 12/09/2017 0432   PROT 7.7 11/08/2017 1210   ALBUMIN 3.4 (L) 11/08/2017 1210   AST 23 11/08/2017 1210   ALT 12 (L) 11/08/2017 1210   ALKPHOS 88 11/08/2017 1210   BILITOT 0.5 11/08/2017 1210   GFRNONAA 50 (L) 12/09/2017 0432   GFRAA 58 (L) 12/09/2017 0432   PT/INR No results for input(s): LABPROT, INR in the last 72 hours.  Studies/Results: No results found.  Assessment/Plan: 72 year old female 2 days status post loop ileostomy takedown.  Doing very well.  Passing flatus and had a small bloody bowel movement overnight.  Discussed that I anticipate her bowel function to continue to improve and for her to be ready to be discharged in the next 24-48 hours.  Encourage  ambulation and incentive spirometer usage.  Plan to start home Eliquis today.   Ricarda Frame, MD [employer] General Surgeon [facility]  Day [facility] [phone] Night [facility] [phone]  12/09/2017

## 2017-12-10 MED ORDER — OXYCODONE-ACETAMINOPHEN 5-325 MG PO TABS: 1.0000 | ORAL_TABLET | ORAL | 0 refills | Status: DC | PRN

## 2017-12-10 NOTE — Discharge Summary (Signed)
Physician Discharge Summary  Patient ID: Ruth Gray MRN: [id number] DOB/AGE: 71-Jul-1947 71 y.o.  Admit date: 12/07/2017 Discharge date: 12/10/2017   Discharge Diagnoses:  Active Problems:   H/O ileostomy   Procedures ileostomy closure  Hospital Course: This a patient with a history of a loop ileostomy.  She was brought to the hospital for closure.  That procedure was uneventful and she made an uncomplicated postoperative recovery tolerating a diet with instructions to change her dressing daily and shower daily.  She will be seen in the office mid week for Penrose drain removal.  She will resume all home medications.  Consults: None  Disposition: 06-Home-Health Care Svc   Allergies as of 12/10/2017      Reactions   Ciprofloxacin Swelling, Other (See Comments)   Joint pain   Tramadol Nausea And Vomiting   Had taken in past with no trouble but last prescription had nausea and vomiting   Penicillins Swelling, Rash   Has patient had a PCN reaction causing immediate rash, facial/tongue/throat swelling, SOB or lightheadedness with hypotension: yes Has patient had a PCN reaction causing severe rash involving mucus membranes or skin necrosis: no Has patient had a PCN reaction that required hospitalization no Has patient had a PCN reaction occurring within the last 10 years: no If all of the above answers are "NO", then may proceed with Cephalosporin use.      Medication List    TAKE these medications   acetaminophen 500 MG tablet Commonly known as:  TYLENOL Take 1,000 mg by mouth every 8 (eight) hours as needed.   CALCIUM 600+D3 PO Take 2 tablets by mouth daily.   carbonyl iron 45 MG Tabs tablet Commonly known as:  FEOSOL Take 45 mg by mouth every Monday, Wednesday, and Friday. In the morning.   ELIQUIS 5 MG Tabs tablet Generic drug:  apixaban TAKE 1 TABLET (5 MG TOTAL) BY MOUTH EVERY 12 (TWELVE) HOURS.   folic acid 1 MG tablet Commonly known as:  FOLVITE Take 2 mg by  mouth daily with breakfast.   methotrexate 50 MG/2ML injection Inject 0.8 mL subcutaneously once a week on Sundays.   metoprolol tartrate 25 MG tablet Commonly known as:  LOPRESSOR TAKE 1/2 TABLET (12.5 MG TOTAL) BY MOUTH 2 (TWO) TIMES DAILY.   oxyCODONE-acetaminophen 5-325 MG tablet Commonly known as:  PERCOCET/ROXICET Take 1-2 tablets by mouth every 4 (four) hours as needed for moderate pain.      Follow-up Information    [name], MD Follow up in 5 day(s).   Specialty:  Surgery Contact information: [address] [place] [address] [phone]           [name], MD, FACS

## 2017-12-10 NOTE — Discharge Instructions (Signed)
Resume home medications May remove dressing daily and shower replacing with a dry dressing over the incision and Penrose drain Follow-up midweek with Dr. Excell Seltzer for drain removal

## 2017-12-10 NOTE — Progress Notes (Signed)
3 Days Post-Op  Subjective: Status post ileostomy closure.  Patient is tolerating a diet and having bowel movements but they are loose.  No fevers or chills no nausea or vomiting  Objective: Vital signs in last 24 hours: Temp:  [98 F (36.7 C)-98.9 F (37.2 C)] 98.2 F (36.8 C) (01/19 0433) Pulse Rate:  [74-93] 87 (01/19 0433) Resp:  [16-20] 16 (01/19 0433) BP: (110-138)/(65-71) 110/67 (01/19 0433) SpO2:  [97 %-100 %] 97 % (01/19 0433) Last BM Date: 12/09/17  Intake/Output from previous day: 01/18 0701 - 01/19 0700 In: 240 [P.O.:240] Out: 1825 [Urine:1825] Intake/Output this shift: No intake/output data recorded.  Physical exam:  Wound is clean without erythema or minimal drainage Penrose drain is in place serous only  Lab Results: CBC  Recent Labs    12/08/17 0509 12/09/17 0432  WBC 6.7 5.4  HGB 10.1* 10.4*  HCT 30.5* 31.2*  PLT 176 203   BMET Recent Labs    12/08/17 0509 12/09/17 0432  NA 133* 135  K 4.7 3.7  CL 103 104  CO2 22 24  GLUCOSE 120* 94  BUN 14 10  CREATININE 1.07* 1.09*  CALCIUM 8.3* 8.2*   PT/INR No results for input(s): LABPROT, INR in the last 72 hours. ABG No results for input(s): PHART, HCO3 in the last 72 hours.  Invalid input(s): PCO2, PO2  Studies/Results: No results found.  Anti-infectives: Anti-infectives (From admission, onward)   Start     Dose/Rate Route Frequency Ordered Stop   12/07/17 1528  clindamycin (CLEOCIN) 900 MG/50ML IVPB    Comments:  Rayann Heman   : cabinet override      12/07/17 1528 12/07/17 1538   12/07/17 0600  clindamycin (CLEOCIN) IVPB 900 mg     900 mg 100 mL/hr over 30 Minutes Intravenous On call to O.R. 12/06/17 2217 12/07/17 1608   12/07/17 0600  gentamicin (GARAMYCIN) 330 mg in dextrose 5 % 100 mL IVPB     5 mg/kg  66.8 kg 108.3 mL/hr over 60 Minutes Intravenous On call to O.R. 12/06/17 2217 12/07/17 1558      Assessment/Plan: s/p Procedure(s): ILEOSTOMY TAKEDOWN   Patient is  doing quite well.  Will likely be able to discharge later this morning for follow-up at the end of the week for Penrose drain removal*  [name], MD, FACS  12/10/2017

## 2017-12-15 ENCOUNTER — Telehealth: Payer: Self-pay | Admitting: General Surgery

## 2017-12-15 NOTE — Telephone Encounter (Signed)
Patient states that she has an odor coming from incision site and has had several bowel movements even though she hasn't eat anything. Would like to speak to nurse - please advise

## 2017-12-15 NOTE — Telephone Encounter (Signed)
Patient stated she smells an odor like dried blood. She denies fever, chills, nausea or vomiting. She is eating. She is having some bowel movements. No constipation. Patient instructed to continue to wash area daily. We will examine her at her appointment tomorrow.

## 2017-12-16 ENCOUNTER — Ambulatory Visit (INDEPENDENT_AMBULATORY_CARE_PROVIDER_SITE_OTHER): Payer: Medicare Other | Admitting: Surgery

## 2017-12-16 ENCOUNTER — Encounter: Payer: Self-pay | Admitting: Surgery

## 2017-12-16 VITALS — BP 126/73 | HR 71 | Temp 97.7°F | Ht 67.0 in | Wt 150.2 lb

## 2017-12-16 DIAGNOSIS — Z4889 Encounter for other specified surgical aftercare: Secondary | ICD-10-CM

## 2017-12-16 NOTE — Patient Instructions (Signed)
We removed your drain today. Please keep a dressing over the area.  Please see your follow up appointment listed below.

## 2017-12-16 NOTE — Progress Notes (Signed)
Outpatient postop visit  12/16/2017  Ruth Gray is an 72 y.o. female.    Procedure: Ileostomy closure  CC: Penrose drain in place  HPI: This patient status post ileostomy closure by Dr. Tonita Cong she has a Penrose drain in place.  Patient has no complaints she is tolerating a diet taking very little pain medication and feels well no fevers or chills  Medications reviewed.    Physical Exam:  There were no vitals taken for this visit.    PE: Penrose drain in place, no erythema minimal drainage if any.  Penrose is removed.  Dressing is placed.    Assessment/Plan:  This patient status post ileostomy closure.  She is here for wound check.  Patient doing very well Penrose is removed today with the remainder the staples were left in place and can be removed next week and Dr. Talmage Coin office.  [name], MD, FACS

## 2017-12-22 ENCOUNTER — Ambulatory Visit (INDEPENDENT_AMBULATORY_CARE_PROVIDER_SITE_OTHER): Payer: Medicare Other | Admitting: General Surgery

## 2017-12-22 ENCOUNTER — Encounter: Payer: Self-pay | Admitting: General Surgery

## 2017-12-22 VITALS — BP 114/64 | HR 66 | Temp 97.8°F | Ht 67.0 in | Wt 150.2 lb

## 2017-12-22 DIAGNOSIS — Z4889 Encounter for other specified surgical aftercare: Secondary | ICD-10-CM

## 2017-12-22 NOTE — Progress Notes (Signed)
Outpatient Surgical Follow Up  12/22/2017  Ruth Gray is an 72 y.o. female.   Chief Complaint  Patient presents with  . Routine Post Op    Post op: Ileostomy takedown Dr.Moesha Sarchet 12/07/17    HPI: 72 year old female returns to clinic for follow-up now 2 weeks status post loop ileostomy takedown.  Patient reports doing well.  She is eating well and having normal bowel function.  She states she is having approximately 5 small bowel movements a day.  She denies any pain.  She has staples in place that need to be removed.  She denies any fevers, chills, nausea, vomiting, chest pain, shortness of breath, diarrhea, constipation.  Past Medical History:  Diagnosis Date  . Anemia   . Collagen vascular disease (HCC)   . DVT (deep venous thrombosis) (HCC) 10/2016   left leg  . Perforation of colon (HCC) 08/2016  . Rheumatoid arteritis     Past Surgical History:  Procedure Laterality Date  . ABDOMINAL HYSTERECTOMY    . BACK SURGERY  2005   slipped disc lower back, [place]  . CENTRAL VENOUS CATHETER INSERTION  09/21/2016   Procedure: INSERTION CENTRAL LINE ADULT;  Surgeon: Tiney Rouge III, MD;  Location: [facility];  Service: General;;  . COLON RESECTION  09/21/2016   Procedure: COLON RESECTION- Ascending and Sigmoid;  Surgeon: Tiney Rouge III, MD;  Location: [facility];  Service: General;;  . COLOSTOMY N/A 09/25/2016   Procedure: COLOSTOMY;  Surgeon: Tiney Rouge III, MD;  Location: [facility];  Service: General;  Laterality: N/A;  . DEBRIDEMENT OF ABDOMINAL WALL ABSCESS N/A 12/03/2016   Procedure: DEBRIDEMENT OF ABDOMINAL WALL ABSCESS;  Surgeon: Leafy Ro, MD;  Location: [facility];  Service: General;  Laterality: N/A;  . FOOT SURGERY Left   . ILEOSTOMY  08/24/2017   Procedure: ILEOSTOMY Loop Creation;  Surgeon: Ricarda Frame, MD;  Location: [facility];  Service: General;;  . ILEOSTOMY CLOSURE N/A 08/24/2017   Procedure: ILEOSTOMY REVERSAL X3;  Surgeon: Ricarda Frame, MD;  Location: [facility];   Service: General;  Laterality: N/A;  . ILEOSTOMY CLOSURE N/A 12/07/2017   Procedure: ILEOSTOMY TAKEDOWN;  Surgeon: Ricarda Frame, MD;  Location: [facility];  Service: General;  Laterality: N/A;  . JOINT REPLACEMENT Left 2012   knee  . LAPAROTOMY N/A 09/21/2016   Procedure: EXPLORATORY LAPAROTOMY;  Surgeon: Tiney Rouge III, MD;  Location: [facility];  Service: General;  Laterality: N/A;  . LAPAROTOMY N/A 09/25/2016   Procedure: EXPLORATORY LAPAROTOMY and right colon resection;  Surgeon: Tiney Rouge III, MD;  Location: [facility];  Service: General;  Laterality: N/A;  . LAPAROTOMY  08/24/2017   Procedure: EXPLORATORY LAPAROTOMY;  Surgeon: Ricarda Frame, MD;  Location: [facility];  Service: General;;  . REPLACEMENT TOTAL KNEE Left   . SKIN GRAFT Left    lateral lower calf above ankle    Family History  Problem Relation Age of Onset  . Breast cancer Other 36  . Prostate cancer Father   . Heart attack Father   . Ovarian cancer Sister   . Kidney cancer Brother   . Lung cancer Sister   . Kidney disease Mother     Social History:  reports that she quit smoking about 17 months ago. Her smoking use included cigarettes. She has a 17.50 pack-year smoking history. she has never used smokeless tobacco. She reports that she does not drink alcohol or use drugs.  Allergies:  Allergies  Allergen Reactions  . Ciprofloxacin Swelling and Other (  See Comments)    Joint pain  . Tramadol Nausea And Vomiting    Had taken in past with no trouble but last prescription had nausea and vomiting  . Penicillins Swelling and Rash    Has patient had a PCN reaction causing immediate rash, facial/tongue/throat swelling, SOB or lightheadedness with hypotension: yes Has patient had a PCN reaction causing severe rash involving mucus membranes or skin necrosis: no Has patient had a PCN reaction that required hospitalization no Has patient had a PCN reaction occurring within the last 10 years: no If all of the above answers  are "NO", then may proceed with Cephalosporin use.     Medications reviewed.    ROS A multipoint review of systems is completed with all pertinent positives and negatives documented within the HPI and the remainder are negative.   BP 114/64   Pulse 66   Temp 97.8 F (36.6 C) (Oral)   Ht 5\' 7"  (1.702 m)   Wt 68.1 kg (150 lb 3.2 oz)   BMI 23.52 kg/m   Physical Exam General: No acute distress Chest: Clear to auscultation Heart: Regular rhythm Abdomen: Soft, nontender, nondistended.  Right lower quadrant ileostomy site with staples in place.  Skin is well approximated without any evidence of erythema or drainage.  Abdomen protrudes in the upper midline consistent with an incisional hernia.  It is soft and easily reducible.    No results found for this or any previous visit (from the past 48 hour(s)). No results found.  Assessment/Plan:  1. Aftercare following surgery 72 year old female now 2 weeks status post loop ileostomy takedown.  She has a complicated surgical history.  She is doing very well.  Staples removed and replaced with Steri-Strips today.  She will return to clinic in 3 weeks for 1 additional wound check.  Discussed that her hernia is nothing to worry about unless it causes her symptoms.  Currently does not.  Follow-up in 3 weeks.     67, MD FACS General Surgeon  12/22/2017,2:07 PM

## 2017-12-22 NOTE — Patient Instructions (Signed)
We have removed your staples and replaced them with steri stripes these will fall off on their own.  We will see you back as listed below.

## 2017-12-26 ENCOUNTER — Encounter: Payer: Medicare Other | Admitting: Surgery

## 2018-01-12 ENCOUNTER — Encounter: Payer: Self-pay | Admitting: General Surgery

## 2018-01-12 ENCOUNTER — Ambulatory Visit (INDEPENDENT_AMBULATORY_CARE_PROVIDER_SITE_OTHER): Payer: Medicare Other | Admitting: General Surgery

## 2018-01-12 VITALS — BP 131/71 | HR 68 | Temp 98.3°F | Wt 151.0 lb

## 2018-01-12 DIAGNOSIS — Z4889 Encounter for other specified surgical aftercare: Secondary | ICD-10-CM

## 2018-01-12 NOTE — Patient Instructions (Signed)
Please give us a call in case you have any questions or concerns.  

## 2018-01-12 NOTE — Progress Notes (Signed)
Outpatient Surgical Follow Up  01/12/2018  Ruth Gray is an 72 y.o. female.   Chief Complaint  Patient presents with  . Routine Post Op    Ileostomy takedown Dr.Jolana Runkles 12/07/17 Wound Check    HPI: 72 year old female returns to clinic for follow-up now 6 weeks status post loop ileostomy takedown.  She reports doing very well.  She has a small area of drainage on the medial aspect of her prior ileostomy site.  She states she is eating well and having normal bowel function.  She denies any fevers, chills, nausea, vomiting, chest pain, shortness of breath.  Past Medical History:  Diagnosis Date  . Anemia   . Collagen vascular disease (HCC)   . DVT (deep venous thrombosis) (HCC) 10/2016   left leg  . Perforation of colon (HCC) 08/2016  . Rheumatoid arteritis     Past Surgical History:  Procedure Laterality Date  . ABDOMINAL HYSTERECTOMY    . BACK SURGERY  2005   slipped disc lower back, [place]  . CENTRAL VENOUS CATHETER INSERTION  09/21/2016   Procedure: INSERTION CENTRAL LINE ADULT;  Surgeon: Tiney Rouge III, MD;  Location: [facility];  Service: General;;  . COLON RESECTION  09/21/2016   Procedure: COLON RESECTION- Ascending and Sigmoid;  Surgeon: Tiney Rouge III, MD;  Location: [facility];  Service: General;;  . COLOSTOMY N/A 09/25/2016   Procedure: COLOSTOMY;  Surgeon: Tiney Rouge III, MD;  Location: [facility];  Service: General;  Laterality: N/A;  . DEBRIDEMENT OF ABDOMINAL WALL ABSCESS N/A 12/03/2016   Procedure: DEBRIDEMENT OF ABDOMINAL WALL ABSCESS;  Surgeon: Leafy Ro, MD;  Location: [facility];  Service: General;  Laterality: N/A;  . FOOT SURGERY Left   . ILEOSTOMY  08/24/2017   Procedure: ILEOSTOMY Loop Creation;  Surgeon: Ricarda Frame, MD;  Location: [facility];  Service: General;;  . ILEOSTOMY CLOSURE N/A 08/24/2017   Procedure: ILEOSTOMY REVERSAL X3;  Surgeon: Ricarda Frame, MD;  Location: [facility];  Service: General;  Laterality: N/A;  . ILEOSTOMY CLOSURE N/A  12/07/2017   Procedure: ILEOSTOMY TAKEDOWN;  Surgeon: Ricarda Frame, MD;  Location: [facility];  Service: General;  Laterality: N/A;  . JOINT REPLACEMENT Left 2012   knee  . LAPAROTOMY N/A 09/21/2016   Procedure: EXPLORATORY LAPAROTOMY;  Surgeon: Tiney Rouge III, MD;  Location: [facility];  Service: General;  Laterality: N/A;  . LAPAROTOMY N/A 09/25/2016   Procedure: EXPLORATORY LAPAROTOMY and right colon resection;  Surgeon: Tiney Rouge III, MD;  Location: [facility];  Service: General;  Laterality: N/A;  . LAPAROTOMY  08/24/2017   Procedure: EXPLORATORY LAPAROTOMY;  Surgeon: Ricarda Frame, MD;  Location: [facility];  Service: General;;  . REPLACEMENT TOTAL KNEE Left   . SKIN GRAFT Left    lateral lower calf above ankle    Family History  Problem Relation Age of Onset  . Breast cancer Other 36  . Prostate cancer Father   . Heart attack Father   . Ovarian cancer Sister   . Kidney cancer Brother   . Lung cancer Sister   . Kidney disease Mother     Social History:  reports that she quit smoking about 17 months ago. Her smoking use included cigarettes. She has a 17.50 pack-year smoking history. she has never used smokeless tobacco. She reports that she does not drink alcohol or use drugs.  Allergies:  Allergies  Allergen Reactions  . Ciprofloxacin Swelling and Other (See Comments)    Joint pain  . Tramadol Nausea  And Vomiting    Had taken in past with no trouble but last prescription had nausea and vomiting  . Penicillins Swelling and Rash    Has patient had a PCN reaction causing immediate rash, facial/tongue/throat swelling, SOB or lightheadedness with hypotension: yes Has patient had a PCN reaction causing severe rash involving mucus membranes or skin necrosis: no Has patient had a PCN reaction that required hospitalization no Has patient had a PCN reaction occurring within the last 10 years: no If all of the above answers are "NO", then may proceed with Cephalosporin use.      Medications reviewed.    ROS A multipoint review of systems was completed.  All pertinent positives and negatives are documented within the HPI and the remainder are negative   BP 131/71   Pulse 68   Temp 98.3 F (36.8 C) (Oral)   Wt 68.5 kg (151 lb)   BMI 23.65 kg/m   Physical Exam General: No acute distress Chest: Clear to auscultation Heart: Regular rate and rhythm Abdomen: Soft, nontender, nondistended.  Small area of clear drainage from the medial aspect of the ileostomy.  Barely except the silver nitrate stick.  Remainder of her wounds are well-healed and approximated without any evidence of erythema or drainage.  There is a palpable upper abdominal hernia defect without any evidence of incarceration.    No results found for this or any previous visit (from the past 48 hour(s)). No results found.  Assessment/Plan:  1. Aftercare following surgery 72 year old female with a complicated surgical history now 6 weeks status post loop ileostomy takedown.  Small area of drainage treated with silver nitrate today.  Discussed appropriate wound care.  Discussed with the patient should the area continue to have drainage over the next 2 weeks to call and come back for follow-up.  Should stop draining and he will then they can follow-up on an as-needed basis.  Discussed signs and symptoms of hernia incarceration or strangulation and to seek immediate follow-up should they happen.  Follow-up as needed.     Ricarda Frame, MD FACS General Surgeon  01/12/2018,2:10 PM

## 2018-02-07 ENCOUNTER — Inpatient Hospital Stay: Payer: Medicare Other | Attending: Oncology

## 2018-02-07 ENCOUNTER — Inpatient Hospital Stay (HOSPITAL_BASED_OUTPATIENT_CLINIC_OR_DEPARTMENT_OTHER): Payer: Medicare Other | Admitting: Oncology

## 2018-02-07 ENCOUNTER — Encounter: Payer: Self-pay | Admitting: Oncology

## 2018-02-07 ENCOUNTER — Other Ambulatory Visit: Payer: Self-pay

## 2018-02-07 VITALS — BP 123/72 | HR 66 | Temp 94.8°F | Resp 18 | Wt 154.3 lb

## 2018-02-07 DIAGNOSIS — Z880 Allergy status to penicillin: Secondary | ICD-10-CM | POA: Diagnosis not present

## 2018-02-07 DIAGNOSIS — Z8042 Family history of malignant neoplasm of prostate: Secondary | ICD-10-CM | POA: Insufficient documentation

## 2018-02-07 DIAGNOSIS — Z888 Allergy status to other drugs, medicaments and biological substances status: Secondary | ICD-10-CM | POA: Diagnosis not present

## 2018-02-07 DIAGNOSIS — Z7901 Long term (current) use of anticoagulants: Secondary | ICD-10-CM | POA: Diagnosis not present

## 2018-02-07 DIAGNOSIS — Z8051 Family history of malignant neoplasm of kidney: Secondary | ICD-10-CM | POA: Diagnosis not present

## 2018-02-07 DIAGNOSIS — Z79899 Other long term (current) drug therapy: Secondary | ICD-10-CM

## 2018-02-07 DIAGNOSIS — Z8041 Family history of malignant neoplasm of ovary: Secondary | ICD-10-CM | POA: Diagnosis not present

## 2018-02-07 DIAGNOSIS — I825Y2 Chronic embolism and thrombosis of unspecified deep veins of left proximal lower extremity: Secondary | ICD-10-CM

## 2018-02-07 DIAGNOSIS — Z881 Allergy status to other antibiotic agents status: Secondary | ICD-10-CM | POA: Insufficient documentation

## 2018-02-07 DIAGNOSIS — M069 Rheumatoid arthritis, unspecified: Secondary | ICD-10-CM | POA: Insufficient documentation

## 2018-02-07 DIAGNOSIS — D6852 Prothrombin gene mutation: Secondary | ICD-10-CM

## 2018-02-07 DIAGNOSIS — Z801 Family history of malignant neoplasm of trachea, bronchus and lung: Secondary | ICD-10-CM | POA: Insufficient documentation

## 2018-02-07 DIAGNOSIS — Z803 Family history of malignant neoplasm of breast: Secondary | ICD-10-CM

## 2018-02-07 DIAGNOSIS — Z87891 Personal history of nicotine dependence: Secondary | ICD-10-CM | POA: Diagnosis not present

## 2018-02-07 DIAGNOSIS — I82402 Acute embolism and thrombosis of unspecified deep veins of left lower extremity: Secondary | ICD-10-CM

## 2018-02-07 LAB — CBC WITH DIFFERENTIAL/PLATELET
Basophils Absolute: 0 10*3/uL (ref 0–0.1)
Basophils Relative: 1 %
EOS ABS: 0.1 10*3/uL (ref 0–0.7)
EOS PCT: 2 %
HCT: 30.3 % — ABNORMAL LOW (ref 35.0–47.0)
HEMOGLOBIN: 10.1 g/dL — AB (ref 12.0–16.0)
LYMPHS ABS: 1.3 10*3/uL (ref 1.0–3.6)
LYMPHS PCT: 23 %
MCH: 32 pg (ref 26.0–34.0)
MCHC: 33.5 g/dL (ref 32.0–36.0)
MCV: 95.7 fL (ref 80.0–100.0)
MONOS PCT: 12 %
Monocytes Absolute: 0.7 10*3/uL (ref 0.2–0.9)
Neutro Abs: 3.6 10*3/uL (ref 1.4–6.5)
Neutrophils Relative %: 62 %
PLATELETS: 255 10*3/uL (ref 150–440)
RBC: 3.17 MIL/uL — AB (ref 3.80–5.20)
RDW: 16.2 % — ABNORMAL HIGH (ref 11.5–14.5)
WBC: 5.8 10*3/uL (ref 3.6–11.0)

## 2018-02-07 LAB — COMPREHENSIVE METABOLIC PANEL
ALK PHOS: 95 U/L (ref 38–126)
ALT: 11 U/L — AB (ref 14–54)
ANION GAP: 7 (ref 5–15)
AST: 22 U/L (ref 15–41)
Albumin: 3.4 g/dL — ABNORMAL LOW (ref 3.5–5.0)
BUN: 19 mg/dL (ref 6–20)
CALCIUM: 8.9 mg/dL (ref 8.9–10.3)
CO2: 24 mmol/L (ref 22–32)
CREATININE: 1.06 mg/dL — AB (ref 0.44–1.00)
Chloride: 104 mmol/L (ref 101–111)
GFR calc Af Amer: >60 mL/min (ref >60)
GFR, EST NON AFRICAN AMERICAN: 52 mL/min — AB (ref >60)
Glucose, Bld: 94 mg/dL (ref 65–99)
Potassium: 4.6 mmol/L (ref 3.5–5.1)
Sodium: 135 mmol/L (ref 135–145)
TOTAL PROTEIN: 7.7 g/dL (ref 6.5–8.1)
Total Bilirubin: 0.7 mg/dL (ref 0.3–1.2)

## 2018-02-07 LAB — FERRITIN: FERRITIN: 48 ng/mL (ref 11–307)

## 2018-02-07 LAB — IRON AND TIBC
Iron: 94 ug/dL (ref 28–170)
Saturation Ratios: 29 % (ref 10.4–31.8)
TIBC: 328 ug/dL (ref 250–450)
UIBC: 234 ug/dL

## 2018-02-07 NOTE — Progress Notes (Signed)
Here for follow up. Stated ostomy reversed and doing well. Living w son in [place]

## 2018-02-07 NOTE — Progress Notes (Signed)
Hematology/Oncology Follow up note [facility] Telephone:(336) [phone] Fax:(336) [phone]   Patient Care Team: Leonel Ramsay, MD as PCP - General (Infectious Diseases)  REFERRING PROVIDER: Leonel Ramsay, MD CHIEF COMPLAINTS/PURPOSE OF CONSULTATION:  Evaluation of DVT  HISTORY OF PRESENTING ILLNESS:  Ruth Gray is a  72 y.o.  female with PMH listed below who was referred to me for evaluation of DVT. Patient had first episode of provoked proximal DVT of left lower extremity (occlusive thrombus throughout the left lower extremity.) in Dec 2017, after a prolonged hospital and rehab stay after partial colon resection and creation of end ileostomy due to perforated diverticulosis.  She was started on Eliquis for anticoagulation for 3 months and came off anticoagulation.  She lives with her son in [place], and found to have left lower extremity swelling again. On 02/25/2017, patient has repeat US venous lower extremity and was found to have recurrent occlusive thrombosis of the left common femoral vein and proximal left femoral vein  She was restarted on Eliquis again.  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 There is nonocclusive thrombus throughout the popliteal vein. Patient was referred to me for decision of anticoagulation duration.  Patient also has RA following with Dr.Kernodle, on MTX and folic acid treatment.  Patient reports tolerating anticoagulation without any bleeding events.   #Hypercoagulable workup reviewed positive for single prothrombin gene mutation, heterozygote   INTERVAL HISTORY Ruth Gray is a 72 y.o. female who has above history reviewed by me today presents for follow up visit for management of DVT and chronic anticoagulation.  She reports doing well.  Denies any acute bleeding, or new episode of lower extremity  swelling.  She reports easy bruising. During the interval she has had a colostomy reversed. Review of Systems  Constitutional: Negative for chills, fever and malaise/fatigue.  HENT: Negative for hearing loss.   Eyes: Negative for blurred vision.  Respiratory: Negative for cough, hemoptysis, sputum production and shortness of breath.   Cardiovascular: Negative for chest pain and orthopnea.  Gastrointestinal: Negative for abdominal pain, heartburn and nausea.  Genitourinary: Negative for dysuria.  Musculoskeletal: Positive for joint pain. Negative for myalgias.  Skin: Negative for rash.  Neurological: Negative for dizziness, sensory change and headaches.  Endo/Heme/Allergies: Does not bruise/bleed easily.  Psychiatric/Behavioral: Negative for depression, hallucinations and substance abuse. The patient is not nervous/anxious.     MEDICAL HISTORY:  Past Medical History:  Diagnosis Date  . Anemia   . Collagen vascular disease ([place])   . DVT (deep venous thrombosis) ([place]) 10/2016   left leg  . Perforation of colon ([place]) 08/2016  . Rheumatoid arteritis     SURGICAL HISTORY: Past Surgical History:  Procedure Laterality Date  . ABDOMINAL HYSTERECTOMY    . BACK SURGERY  2005   slipped disc lower back, [place]  . CENTRAL VENOUS CATHETER INSERTION  09/21/2016   Procedure: INSERTION CENTRAL LINE ADULT;  Surgeon: Dia Crawford III, MD;  Location: [facility];  Service: General;;  . COLON RESECTION  09/21/2016   Procedure: COLON RESECTION- Ascending and Sigmoid;  Surgeon: Dia Crawford III, MD;  Location: [facility];  Service: General;;  . COLOSTOMY N/A 09/25/2016   Procedure: COLOSTOMY;  Surgeon: Dia Crawford III, MD;  Location: [facility];  Service: General;  Laterality: N/A;  . DEBRIDEMENT OF ABDOMINAL WALL ABSCESS N/A 12/03/2016   Procedure:  DEBRIDEMENT OF ABDOMINAL WALL ABSCESS;  Surgeon: Jules Husbands, MD;  Location: [facility];  Service: General;  Laterality: N/A;  . FOOT SURGERY Left   . ILEOSTOMY   08/24/2017   Procedure: ILEOSTOMY Loop Creation;  Surgeon: Clayburn Pert, MD;  Location: [facility];  Service: General;;  . ILEOSTOMY CLOSURE N/A 08/24/2017   Procedure: ILEOSTOMY REVERSAL X3;  Surgeon: Clayburn Pert, MD;  Location: [facility];  Service: General;  Laterality: N/A;  . ILEOSTOMY CLOSURE N/A 12/07/2017   Procedure: ILEOSTOMY TAKEDOWN;  Surgeon: Clayburn Pert, MD;  Location: [facility];  Service: General;  Laterality: N/A;  . JOINT REPLACEMENT Left 2012   knee  . LAPAROTOMY N/A 09/21/2016   Procedure: EXPLORATORY LAPAROTOMY;  Surgeon: Dia Crawford III, MD;  Location: [facility];  Service: General;  Laterality: N/A;  . LAPAROTOMY N/A 09/25/2016   Procedure: EXPLORATORY LAPAROTOMY and right colon resection;  Surgeon: Dia Crawford III, MD;  Location: [facility];  Service: General;  Laterality: N/A;  . LAPAROTOMY  08/24/2017   Procedure: EXPLORATORY LAPAROTOMY;  Surgeon: Clayburn Pert, MD;  Location: [facility];  Service: General;;  . REPLACEMENT TOTAL KNEE Left   . SKIN GRAFT Left    lateral lower calf above ankle    SOCIAL HISTORY: Social History   Socioeconomic History  . Marital status: Widowed    Spouse name: Not on file  . Number of children: Not on file  . Years of education: Not on file  . Highest education level: Not on file  Social Needs  . Financial resource strain: Not on file  . Food insecurity - worry: Not on file  . Food insecurity - inability: Not on file  . Transportation needs - medical: Not on file  . Transportation needs - non-medical: Not on file  Occupational History  . Not on file  Tobacco Use  . Smoking status: Former Smoker    Packs/day: 0.50    Years: 35.00    Pack years: 17.50    Types: Cigarettes    Last attempt to quit: 07/15/2016    Years since quitting: 1.5  . Smokeless tobacco: Never Used  Substance and Sexual Activity  . Alcohol use: No  . Drug use: No  . Sexual activity: No  Other Topics Concern  . Not on file  Social History Narrative   . Not on file    FAMILY HISTORY: Family History  Problem Relation Age of Onset  . Breast cancer Other 36  . Prostate cancer Father   . Heart attack Father   . Ovarian cancer Sister   . Kidney cancer Brother   . Lung cancer Sister   . Kidney disease Mother     ALLERGIES:  is allergic to ciprofloxacin; tramadol; and penicillins.  MEDICATIONS:  Current Outpatient Medications  Medication Sig Dispense Refill  . Calcium Carb-Cholecalciferol (CALCIUM 600+D3 PO) Take 2 tablets by mouth daily.    . carbonyl iron (FEOSOL) 45 MG TABS tablet Take 45 mg by mouth every Monday, Wednesday, and Friday. In the morning.    Marland Kitchen ELIQUIS 5 MG TABS tablet TAKE 1 TABLET (5 MG TOTAL) BY MOUTH EVERY 12 (TWELVE) HOURS.  1  . folic acid (FOLVITE) 1 MG tablet Take 2 mg by mouth daily with breakfast.     . methotrexate 50 MG/2ML injection Inject 0.8 mL subcutaneously once a week on Sundays.    . metoprolol tartrate (LOPRESSOR) 25 MG tablet TAKE 1/2 TABLET (12.5 MG TOTAL) BY MOUTH 2 (TWO) TIMES DAILY.  11  .  acetaminophen (TYLENOL) 500 MG tablet Take 1,000 mg by mouth every 8 (eight) hours as needed.     Marland Kitchen oxyCODONE-acetaminophen (PERCOCET/ROXICET) 5-325 MG tablet Take 1-2 tablets by mouth every 4 (four) hours as needed for moderate pain. (Patient not taking: Reported on 02/07/2018) 20 tablet 0   No current facility-administered medications for this visit.      PHYSICAL EXAMINATION: ECOG PERFORMANCE STATUS: 0 - Asymptomatic Vitals:   02/07/18 1127  BP: 123/72  Pulse: 66  Resp: 18  Temp: (!) 94.8 F (34.9 C)   Filed Weights   02/07/18 1127  Weight: 154 lb 4.8 oz (70 kg)    Physical Exam  Constitutional: She is oriented to person, place, and time and well-developed, well-nourished, and in no distress. No distress.  HENT:  Head: Normocephalic and atraumatic.  Eyes: EOM are normal. Pupils are equal, round, and reactive to light.  Pale conjuctivae  Neck: Normal range of motion. Neck supple. No JVD  present.  Cardiovascular: Normal rate, regular rhythm and normal heart sounds. Exam reveals no friction rub.  Pulmonary/Chest: Effort normal and breath sounds normal. No respiratory distress.  Abdominal: Bowel sounds are normal. She exhibits no distension.   Ventral  hernia  Musculoskeletal: Normal range of motion. She exhibits no edema.  Lymphadenopathy:    She has no cervical adenopathy.  Neurological: She is alert and oriented to person, place, and time. She displays normal reflexes.  Skin: Skin is warm and dry.  Psychiatric: Affect normal.     LABORATORY DATA:  I have reviewed the data as listed Lab Results  Component Value Date   WBC 5.8 02/07/2018   HGB 10.1 (L) 02/07/2018   HCT 30.3 (L) 02/07/2018   MCV 95.7 02/07/2018   PLT 255 02/07/2018   Recent Labs    08/27/17 0513  11/08/17 1210  12/08/17 0509 12/09/17 0432 02/07/18 1104  NA 138   < > 137  --  133* 135 135  K 3.5   < > 4.2  --  4.7 3.7 4.6  CL 107   < > 104  --  103 104 104  CO2 27   < > 24  --  22 24 24   GLUCOSE 142*   < > 86  --  120* 94 94  BUN 14   < > 15  --  14 10 19   CREATININE 1.17*   < > 1.09*   < > 1.07* 1.09* 1.06*  CALCIUM 8.3*   < > 8.8*  --  8.3* 8.2* 8.9  GFRNONAA 46*   < > 50*   < > 51* 50* 52*  GFRAA 53*   < > 58*   < > 60* 58* >60  PROT 5.8*  --  7.7  --   --   --  7.7  ALBUMIN 2.1*  --  3.4*  --   --   --  3.4*  AST 18  --  23  --   --   --  22  ALT 10*  --  12*  --   --   --  11*  ALKPHOS 54  --  88  --   --   --  95  BILITOT 0.9  --  0.5  --   --   --  0.7   < > = values in this interval not displayed.    RADIOGRAPHIC STUDIES: I have personally reviewed the radiological images as listed and agreed with the findings in the report. US  venous lower extremity on 10/29/2016, 02/25/2017, 10/27/2017.   ASSESSMENT & PLAN:  1. Chronic deep vein thrombosis (DVT) of proximal vein of left lower extremity ([place])   2. Prothrombin gene mutation ([facility])    #Continue Eliquis 5 mg twice daily for  now.  repeat ultrasound if no chronic appearance of DVT, consider low-dose Eliquis 2.5 twice daily for maintenance long-term.  # Hypercoagulable workup: She is positive for heterozygous prothrombin gene mutation.  Discussed with patient that hypercoagulable workup at this point does not change her anticoagulation position. her first relative may need to be screened.    #Normocytic anemia, we'll check basic workup including CMP, CBC, B12, folate, iron panel.   All questions were answered. The patient knows to call the clinic with any problems questions or concerns.  Return of visit: 4 weeks to discuss imaging results and the future plan for anticoagulation.    Earlie Server, MD, PhD Hematology Oncology [facility] at [facility] Pager- [phone] 02/07/2018

## 2018-02-14 ENCOUNTER — Ambulatory Visit
Admission: RE | Admit: 2018-02-14 | Discharge: 2018-02-14 | Disposition: A | Discharge status: Home / Self Care | Payer: Medicare Other | Source: Ambulatory Visit | Attending: Oncology | Admitting: Oncology

## 2018-02-14 DIAGNOSIS — I825Y2 Chronic embolism and thrombosis of unspecified deep veins of left proximal lower extremity: Secondary | ICD-10-CM | POA: Insufficient documentation

## 2018-03-07 ENCOUNTER — Inpatient Hospital Stay: Payer: Medicare Other | Attending: Oncology | Admitting: Oncology

## 2018-03-07 ENCOUNTER — Encounter: Payer: Self-pay | Admitting: Oncology

## 2018-03-07 ENCOUNTER — Other Ambulatory Visit: Payer: Self-pay

## 2018-03-07 VITALS — BP 127/73 | HR 64 | Temp 93.4°F | Resp 18 | Wt 160.9 lb

## 2018-03-07 DIAGNOSIS — D649 Anemia, unspecified: Secondary | ICD-10-CM | POA: Diagnosis not present

## 2018-03-07 DIAGNOSIS — Z803 Family history of malignant neoplasm of breast: Secondary | ICD-10-CM

## 2018-03-07 DIAGNOSIS — Z79899 Other long term (current) drug therapy: Secondary | ICD-10-CM | POA: Diagnosis not present

## 2018-03-07 DIAGNOSIS — Z7901 Long term (current) use of anticoagulants: Secondary | ICD-10-CM | POA: Diagnosis not present

## 2018-03-07 DIAGNOSIS — Z87891 Personal history of nicotine dependence: Secondary | ICD-10-CM | POA: Diagnosis not present

## 2018-03-07 DIAGNOSIS — Z801 Family history of malignant neoplasm of trachea, bronchus and lung: Secondary | ICD-10-CM | POA: Diagnosis not present

## 2018-03-07 DIAGNOSIS — M069 Rheumatoid arthritis, unspecified: Secondary | ICD-10-CM | POA: Diagnosis not present

## 2018-03-07 DIAGNOSIS — I825Y2 Chronic embolism and thrombosis of unspecified deep veins of left proximal lower extremity: Secondary | ICD-10-CM

## 2018-03-07 DIAGNOSIS — Z8041 Family history of malignant neoplasm of ovary: Secondary | ICD-10-CM | POA: Insufficient documentation

## 2018-03-07 DIAGNOSIS — D6852 Prothrombin gene mutation: Secondary | ICD-10-CM | POA: Diagnosis not present

## 2018-03-07 DIAGNOSIS — Z86718 Personal history of other venous thrombosis and embolism: Secondary | ICD-10-CM | POA: Diagnosis not present

## 2018-03-07 MED ORDER — FERROUS SULFATE 325 (65 FE) MG PO TBEC: 325.0000 mg | DELAYED_RELEASE_TABLET | Freq: Two times a day (BID) | ORAL | 3 refills | Status: DC

## 2018-03-07 MED ORDER — APIXABAN 2.5 MG PO TABS: 2.5000 mg | ORAL_TABLET | Freq: Two times a day (BID) | ORAL | 3 refills | Status: DC

## 2018-03-07 NOTE — Progress Notes (Signed)
Here for follow up. Stated that " abdominal hernia has increased in size " pt stated .wearing a brace today.

## 2018-03-07 NOTE — Progress Notes (Signed)
Hematology/Oncology Follow up note [facility] Telephone:(336) [phone] Fax:(336) [phone]   Patient Care Team: Leonel Ramsay, MD as PCP - General (Infectious Diseases)  REFERRING PROVIDER: Leonel Ramsay, MD CHIEF COMPLAINTS/PURPOSE OF CONSULTATION:  Evaluation of DVT  HISTORY OF PRESENTING ILLNESS:  Ruth Gray is a  72 y.o.  adult with PMH listed below who was referred to me for evaluation of DVT. Patient had first episode of provoked proximal DVT of left lower extremity (occlusive thrombus throughout the left lower extremity.) in Dec 2017, after a prolonged hospital and rehab stay after partial colon resection and creation of end ileostomy due to perforated diverticulosis.  She was started on Eliquis for anticoagulation for 3 months and came off anticoagulation.  She lives with her son in [place], and found to have left lower extremity swelling again. On 02/25/2017, patient has repeat US venous lower extremity and was found to have recurrent occlusive thrombosis of the left common femoral vein and proximal left femoral vein  She was restarted on Eliquis again.  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 There is nonocclusive thrombus throughout the popliteal vein. Patient was referred to me for decision of anticoagulation duration.  Patient also has RA following with Dr.Kernodle, on MTX and folic acid treatment.  Patient reports tolerating anticoagulation without any bleeding events.   #Hypercoagulable workup reviewed positive for single prothrombin gene mutation, heterozygote   INTERVAL HISTORY Ruth Gray is a 72 y.o. adult who has above history reviewed by me today presents for follow up visit for management of DVT and chronic anticoagulation.  She reports doing well.  Denies any acute bleeding, or new episode of lower extremity  swelling.  She reports easy bruising. During the interval she has had a colostomy reversed.  Review of Systems  Constitutional: Negative for chills, fever and malaise/fatigue.  HENT: Negative for hearing loss.   Eyes: Negative for blurred vision.  Respiratory: Negative for cough, hemoptysis, sputum production and shortness of breath.   Cardiovascular: Negative for chest pain and orthopnea.  Gastrointestinal: Negative for abdominal pain, heartburn and nausea.  Genitourinary: Negative for dysuria.  Musculoskeletal: Positive for joint pain. Negative for myalgias.  Skin: Negative for rash.  Neurological: Negative for dizziness, sensory change and headaches.  Endo/Heme/Allergies: Does not bruise/bleed easily.  Psychiatric/Behavioral: Negative for depression, hallucinations and substance abuse. The patient is not nervous/anxious.     MEDICAL HISTORY:  Past Medical History:  Diagnosis Date  . Anemia   . Collagen vascular disease ([place])   . DVT (deep venous thrombosis) ([place]) 10/2016   left leg  . Perforation of colon ([place]) 08/2016  . Rheumatoid arteritis     SURGICAL HISTORY: Past Surgical History:  Procedure Laterality Date  . ABDOMINAL HYSTERECTOMY    . BACK SURGERY  2005   slipped disc lower back, [place]  . CENTRAL VENOUS CATHETER INSERTION  09/21/2016   Procedure: INSERTION CENTRAL LINE ADULT;  Surgeon: Dia Crawford III, MD;  Location: [facility];  Service: General;;  . COLON RESECTION  09/21/2016   Procedure: COLON RESECTION- Ascending and Sigmoid;  Surgeon: Dia Crawford III, MD;  Location: [facility];  Service: General;;  . COLOSTOMY N/A 09/25/2016   Procedure: COLOSTOMY;  Surgeon: Dia Crawford III, MD;  Location: [facility];  Service: General;  Laterality: N/A;  . DEBRIDEMENT OF ABDOMINAL WALL ABSCESS N/A 12/03/2016  Procedure: DEBRIDEMENT OF ABDOMINAL WALL ABSCESS;  Surgeon: Jules Husbands, MD;  Location: [facility];  Service: General;  Laterality: N/A;  . FOOT SURGERY Left   . ILEOSTOMY   08/24/2017   Procedure: ILEOSTOMY Loop Creation;  Surgeon: Clayburn Pert, MD;  Location: [facility];  Service: General;;  . ILEOSTOMY CLOSURE N/A 08/24/2017   Procedure: ILEOSTOMY REVERSAL X3;  Surgeon: Clayburn Pert, MD;  Location: [facility];  Service: General;  Laterality: N/A;  . ILEOSTOMY CLOSURE N/A 12/07/2017   Procedure: ILEOSTOMY TAKEDOWN;  Surgeon: Clayburn Pert, MD;  Location: [facility];  Service: General;  Laterality: N/A;  . JOINT REPLACEMENT Left 2012   knee  . LAPAROTOMY N/A 09/21/2016   Procedure: EXPLORATORY LAPAROTOMY;  Surgeon: Dia Crawford III, MD;  Location: [facility];  Service: General;  Laterality: N/A;  . LAPAROTOMY N/A 09/25/2016   Procedure: EXPLORATORY LAPAROTOMY and right colon resection;  Surgeon: Dia Crawford III, MD;  Location: [facility];  Service: General;  Laterality: N/A;  . LAPAROTOMY  08/24/2017   Procedure: EXPLORATORY LAPAROTOMY;  Surgeon: Clayburn Pert, MD;  Location: [facility];  Service: General;;  . REPLACEMENT TOTAL KNEE Left   . SKIN GRAFT Left    lateral lower calf above ankle    SOCIAL HISTORY: Social History   Socioeconomic History  . Marital status: Widowed    Spouse name: Not on file  . Number of children: Not on file  . Years of education: Not on file  . Highest education level: Not on file  Occupational History  . Not on file  Social Needs  . Financial resource strain: Not on file  . Food insecurity:    Worry: Not on file    Inability: Not on file  . Transportation needs:    Medical: Not on file    Non-medical: Not on file  Tobacco Use  . Smoking status: Former Smoker    Packs/day: 0.50    Years: 35.00    Pack years: 17.50    Types: Cigarettes    Last attempt to quit: 07/15/2016    Years since quitting: 1.6  . Smokeless tobacco: Never Used  Substance and Sexual Activity  . Alcohol use: No  . Drug use: No  . Sexual activity: Never  Lifestyle  . Physical activity:    Days per week: Not on file    Minutes per session: Not on  file  . Stress: Not on file  Relationships  . Social connections:    Talks on phone: Not on file    Gets together: Not on file    Attends religious service: Not on file    Active member of club or organization: Not on file    Attends meetings of clubs or organizations: Not on file    Relationship status: Not on file  . Intimate partner violence:    Fear of current or ex partner: Not on file    Emotionally abused: Not on file    Physically abused: Not on file    Forced sexual activity: Not on file  Other Topics Concern  . Not on file  Social History Narrative  . Not on file    FAMILY HISTORY: Family History  Problem Relation Age of Onset  . Breast cancer Other 36  . Prostate cancer Father   . Heart attack Father   . Ovarian cancer Sister   . Kidney cancer Brother   . Lung cancer Sister   . Kidney disease Mother     ALLERGIES:  is allergic to ciprofloxacin; tramadol; and penicillins.  MEDICATIONS:  Current Outpatient Medications  Medication Sig Dispense Refill  . Calcium Carb-Cholecalciferol (CALCIUM 600+D3 PO) Take 2 tablets by mouth daily.    . carbonyl iron (FEOSOL) 45 MG TABS tablet Take 45 mg by mouth every Monday, Wednesday, and Friday. In the morning.    Marland Kitchen ELIQUIS 5 MG TABS tablet TAKE 1 TABLET (5 MG TOTAL) BY MOUTH EVERY 12 (TWELVE) HOURS.  1  . methotrexate 50 MG/2ML injection Inject 0.8 mL subcutaneously once a week on Sundays.    . metoprolol tartrate (LOPRESSOR) 25 MG tablet TAKE 1/2 TABLET (12.5 MG TOTAL) BY MOUTH 2 (TWO) TIMES DAILY.  11  . acetaminophen (TYLENOL) 500 MG tablet Take 1,000 mg by mouth every 8 (eight) hours as needed.     Marland Kitchen oxyCODONE-acetaminophen (PERCOCET/ROXICET) 5-325 MG tablet Take 1-2 tablets by mouth every 4 (four) hours as needed for moderate pain. (Patient not taking: Reported on 02/07/2018) 20 tablet 0   No current facility-administered medications for this visit.      PHYSICAL EXAMINATION: ECOG PERFORMANCE STATUS: 0 -  Asymptomatic Vitals:   03/07/18 1029  BP: 127/73  Pulse: 64  Resp: 18  Temp: (!) 93.4 F (34.1 C)   Filed Weights   03/07/18 1029  Weight: 160 lb 14.4 oz (73 kg)    Physical Exam  Constitutional: She is oriented to person, place, and time and well-developed, well-nourished, and in no distress. No distress.  HENT:  Head: Normocephalic and atraumatic.  Eyes: Pupils are equal, round, and reactive to light. EOM are normal.  Pale conjuctivae  Neck: Normal range of motion. Neck supple. No JVD present.  Cardiovascular: Normal rate, regular rhythm and normal heart sounds. Exam reveals no friction rub.  Pulmonary/Chest: Effort normal and breath sounds normal. No respiratory distress.  Abdominal: Bowel sounds are normal. She exhibits no distension.   Ventral  hernia  Musculoskeletal: Normal range of motion. She exhibits no edema.  Lymphadenopathy:    She has no cervical adenopathy.  Neurological: She is alert and oriented to person, place, and time. She displays normal reflexes.  Skin: Skin is warm and dry.  Psychiatric: Affect normal.     LABORATORY DATA:  I have reviewed the data as listed Lab Results  Component Value Date   WBC 5.8 02/07/2018   HGB 10.1 (L) 02/07/2018   HCT 30.3 (L) 02/07/2018   MCV 95.7 02/07/2018   PLT 255 02/07/2018   Recent Labs    08/27/17 0513  11/08/17 1210  12/08/17 0509 12/09/17 0432 02/07/18 1104  NA 138   < > 137  --  133* 135 135  K 3.5   < > 4.2  --  4.7 3.7 4.6  CL 107   < > 104  --  103 104 104  CO2 27   < > 24  --  22 24 24   GLUCOSE 142*   < > 86  --  120* 94 94  BUN 14   < > 15  --  14 10 19   CREATININE 1.17*   < > 1.09*   < > 1.07* 1.09* 1.06*  CALCIUM 8.3*   < > 8.8*  --  8.3* 8.2* 8.9  GFRNONAA 46*   < > 50*   < > 51* 50* 52*  GFRAA 53*   < > 58*   < > 60* 58* >60  PROT 5.8*  --  7.7  --   --   --  7.7  ALBUMIN 2.1*  --  3.4*  --   --   --  3.4*  AST 18  --  23  --   --   --  22  ALT 10*  --  12*  --   --   --  11*    ALKPHOS 54  --  88  --   --   --  95  BILITOT 0.9  --  0.5  --   --   --  0.7   < > = values in this interval not displayed.    RADIOGRAPHIC STUDIES: I have personally reviewed the radiological images as listed and agreed with the findings in the report. US venous lower extremity on 10/29/2016, 02/25/2017, 10/27/2017.   ASSESSMENT & PLAN:  1. Chronic deep vein thrombosis (DVT) of proximal vein of left lower extremity ([place])   2. Prothrombin gene mutation ([place])   3. Anemia, unspecified type    #Continue Eliquis 5 mg twice daily for now.  repeat ultrasound if no chronic appearance of DVT, consider low-dose Eliquis 2.5 twice daily for maintenance long-term.  # Hypercoagulable workup: She is positive for heterozygous prothrombin gene mutation.  Discussed with patient that hypercoagulable workup at this point does not change her anticoagulation position. her first relative may need to be screened.    #Normocytic anemia, we'll check basic workup including CMP, CBC, B12, folate, iron panel.   All questions were answered. The patient knows to call the clinic with any problems questions or concerns.  Return of visit: 4 weeks.   # Repeat ultrasound showed stable appearance of chronic DVT. She has completed about 12 months of full dose anticoagulation with Eliquis, will contact her and advise patient to take Eliquis 2.5mg  BID for long term anticoagulation. (AMPLIFY extension trial).   Earlie Server, MD, PhD Hematology Oncology [facility] at [facility] Pager- [phone] 03/07/2018

## 2018-05-16 ENCOUNTER — Ambulatory Visit (INDEPENDENT_AMBULATORY_CARE_PROVIDER_SITE_OTHER): Payer: Medicare Other | Admitting: Surgery

## 2018-05-16 ENCOUNTER — Encounter: Payer: Self-pay | Admitting: Surgery

## 2018-05-16 VITALS — BP 126/69 | HR 75 | Temp 97.8°F | Wt 174.0 lb

## 2018-05-16 DIAGNOSIS — R1084 Generalized abdominal pain: Secondary | ICD-10-CM

## 2018-05-16 NOTE — Patient Instructions (Signed)
Please give [place] a call in case you have any more pain. We could order a CT Scan if you call.

## 2018-05-16 NOTE — Progress Notes (Signed)
Outpatient Surgical Follow Up  05/16/2018  Ruth Gray is an 72 y.o. adult.   CC:VH, right lower quadrant abdominal pain  HPI: This patient with multiple abdominal surgeries most recently and ileostomy closure by Dr. Tonita Cong.  He had noted in his recordings the presence of an upper abdominal ventral hernia.  Patient is here to have it evaluated. 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 Patient actually complains of right lower quadrant pain that started a month ago.  She is having no other symptoms.  In fact she is eating well having no nausea vomiting no weight loss no fevers or chills and normal bowel movements. Past Medical History:  Diagnosis Date  . Anemia   . Collagen vascular disease (HCC)   . DVT (deep venous thrombosis) (HCC) 10/2016   left leg  . Perforation of colon (HCC) 08/2016  . Rheumatoid arteritis     Past Surgical History:  Procedure Laterality Date  . ABDOMINAL HYSTERECTOMY    . BACK SURGERY  2005   slipped disc lower back, [place]  . CENTRAL VENOUS CATHETER INSERTION  09/21/2016   Procedure: INSERTION CENTRAL LINE ADULT;  Surgeon: Tiney Rouge III, MD;  Location: [facility];  Service: General;;  . COLON RESECTION  09/21/2016   Procedure: COLON RESECTION- Ascending and Sigmoid;  Surgeon: Tiney Rouge III, MD;  Location: [facility];  Service: General;;  . COLOSTOMY N/A 09/25/2016   Procedure: COLOSTOMY;  Surgeon: Tiney Rouge III, MD;  Location: [facility];  Service: General;  Laterality: N/A;  . DEBRIDEMENT OF ABDOMINAL WALL ABSCESS N/A 12/03/2016   Procedure: DEBRIDEMENT OF ABDOMINAL WALL ABSCESS;  Surgeon: Leafy Ro, MD;  Location: [facility];  Service: General;  Laterality: N/A;  . FOOT SURGERY Left   . ILEOSTOMY  08/24/2017   Procedure: ILEOSTOMY Loop Creation;  Surgeon: Ricarda Frame, MD;  Location: [facility];  Service: General;;   . ILEOSTOMY CLOSURE N/A 08/24/2017   Procedure: ILEOSTOMY REVERSAL X3;  Surgeon: Ricarda Frame, MD;  Location: [facility];  Service: General;  Laterality: N/A;  . ILEOSTOMY CLOSURE N/A 12/07/2017   Procedure: ILEOSTOMY TAKEDOWN;  Surgeon: Ricarda Frame, MD;  Location: [facility];  Service: General;  Laterality: N/A;  . JOINT REPLACEMENT Left 2012   knee  . LAPAROTOMY N/A 09/21/2016   Procedure: EXPLORATORY LAPAROTOMY;  Surgeon: Tiney Rouge III, MD;  Location: [facility];  Service: General;  Laterality: N/A;  . LAPAROTOMY N/A 09/25/2016   Procedure: EXPLORATORY LAPAROTOMY and right colon resection;  Surgeon: Tiney Rouge III, MD;  Location: [facility];  Service: General;  Laterality: N/A;  . LAPAROTOMY  08/24/2017   Procedure: EXPLORATORY LAPAROTOMY;  Surgeon: Ricarda Frame, MD;  Location: [facility];  Service: General;;  . REPLACEMENT TOTAL KNEE Left   . SKIN GRAFT Left    lateral lower calf above ankle    Family History  Problem Relation Age of Onset  . Breast cancer Other 36  . Prostate cancer Father   . Heart attack Father   . Ovarian cancer Sister   . Kidney cancer Brother   . Lung cancer Sister   . Kidney disease Mother     Social History:  reports that she quit smoking about 22 months ago. Her smoking use included cigarettes. She has a 17.50 pack-year smoking history. She has never used smokeless tobacco. She reports that she does not drink  alcohol or use drugs.  Allergies:  Allergies  Allergen Reactions  . Ciprofloxacin Swelling and Other (See Comments)    Joint pain  . Tramadol Nausea And Vomiting    Had taken in past with no trouble but last prescription had nausea and vomiting  . Penicillins Swelling and Rash    Has patient had a PCN reaction causing immediate rash, facial/tongue/throat swelling, SOB or lightheadedness with hypotension: yes Has patient had a PCN reaction causing severe rash involving mucus membranes or skin necrosis: no Has patient had a PCN reaction that  required hospitalization no Has patient had a PCN reaction occurring within the last 10 years: no If all of the above answers are "NO", then may proceed with Cephalosporin use.     Medications reviewed.   Review of Systems:   Review of Systems  Constitutional: Negative.   HENT: Negative.   Eyes: Negative.   Respiratory: Negative.   Cardiovascular: Negative.   Gastrointestinal: Positive for abdominal pain. Negative for blood in stool, constipation, diarrhea, heartburn, melena, nausea and vomiting.  Genitourinary: Negative.   Musculoskeletal: Negative.   Skin: Negative.   Neurological: Negative.   Endo/Heme/Allergies: Negative.   Psychiatric/Behavioral: Negative.      Physical Exam:  There were no vitals taken for this visit.  Physical Exam  Constitutional: She is oriented to person, place, and time. She appears well-developed and well-nourished. No distress.  HENT:  Head: Normocephalic and atraumatic.  Eyes: Pupils are equal, round, and reactive to light. EOM are normal.  Neck: Normal range of motion. Neck supple.  Cardiovascular: Normal rate, regular rhythm and normal heart sounds.  Pulmonary/Chest: Effort normal. No respiratory distress.  Abdominal: Soft. She exhibits no distension. There is no tenderness. There is no guarding. A hernia is present.  Abdomen is soft and completely nontender extensive scars of the abdominal wall are present and a huge upper abdominal ventral hernia is identified when she does a sit up.  She is examined supine and in the sit up position and a widemouth hernia measuring at least 20 cm edge to edge is noted.  It is easily reducible.  It is not in the area of her prior pain which is beneath her ileostomy site.  I see no hernia in the ileostomy site.  Neurological: She is alert and oriented to person, place, and time.  Skin: She is not diaphoretic.  Vitals reviewed.     No results found for this or any previous visit (from the past 48  hour(s)). No results found.  Assessment/Plan:  Records are reviewed.  This a patient with right lower quadrant abdominal pain and a known ventral hernia in the upper abdomen the 2 are not related by my estimation.  I offered CT scanning to the patient at this time to try to identify the source of her right lower quadrant pain.  However this patient has an extremely complicated surgical history with multiple operations enterocutaneous fistula protecting ileostomy with closure and now a huge ventral hernia.  She also has a significant history for deep venous thrombosis.  I would not recommend surgery in this patient under almost any circumstance and she is in full agreement with that.  She wants no additional surgery and therefore has decided to forego any further investigation of this via CT scan.  She is invited to follow-up or call if this is worsening at any time and we could see her in order the CT scan if indicated.  [name], MD, FACS

## 2018-06-06 ENCOUNTER — Inpatient Hospital Stay: Payer: Medicare Other | Attending: Oncology

## 2018-06-06 DIAGNOSIS — I825Y2 Chronic embolism and thrombosis of unspecified deep veins of left proximal lower extremity: Secondary | ICD-10-CM | POA: Diagnosis present

## 2018-06-06 DIAGNOSIS — Z7901 Long term (current) use of anticoagulants: Secondary | ICD-10-CM | POA: Diagnosis not present

## 2018-06-06 DIAGNOSIS — Z86718 Personal history of other venous thrombosis and embolism: Secondary | ICD-10-CM | POA: Insufficient documentation

## 2018-06-06 DIAGNOSIS — Z87891 Personal history of nicotine dependence: Secondary | ICD-10-CM | POA: Diagnosis not present

## 2018-06-06 DIAGNOSIS — M069 Rheumatoid arthritis, unspecified: Secondary | ICD-10-CM | POA: Insufficient documentation

## 2018-06-06 DIAGNOSIS — Z79899 Other long term (current) drug therapy: Secondary | ICD-10-CM | POA: Insufficient documentation

## 2018-06-06 DIAGNOSIS — D649 Anemia, unspecified: Secondary | ICD-10-CM

## 2018-06-06 DIAGNOSIS — D6852 Prothrombin gene mutation: Secondary | ICD-10-CM | POA: Diagnosis not present

## 2018-06-06 LAB — CBC WITH DIFFERENTIAL/PLATELET
BASOS PCT: 0 %
Basophils Absolute: 0 10*3/uL (ref 0–0.1)
EOS ABS: 0.2 10*3/uL (ref 0–0.7)
Eosinophils Relative: 3 %
HCT: 31.9 % — ABNORMAL LOW (ref 35.0–47.0)
HEMOGLOBIN: 10.7 g/dL — AB (ref 12.0–16.0)
Lymphocytes Relative: 23 %
Lymphs Abs: 1.2 10*3/uL (ref 1.0–3.6)
MCH: 32.3 pg (ref 26.0–34.0)
MCHC: 33.7 g/dL (ref 32.0–36.0)
MCV: 96 fL (ref 80.0–100.0)
MONOS PCT: 13 %
Monocytes Absolute: 0.7 10*3/uL (ref 0.2–0.9)
Neutro Abs: 3.2 10*3/uL (ref 1.4–6.5)
Neutrophils Relative %: 61 %
Platelets: 273 10*3/uL (ref 150–440)
RBC: 3.32 MIL/uL — ABNORMAL LOW (ref 3.80–5.20)
RDW: 16.6 % — ABNORMAL HIGH (ref 11.5–14.5)
WBC: 5.2 10*3/uL (ref 3.6–11.0)

## 2018-06-06 LAB — IRON AND TIBC
Iron: 160 ug/dL (ref 28–170)
SATURATION RATIOS: 56 % — AB (ref 10.4–31.8)
TIBC: 285 ug/dL (ref 250–450)
UIBC: 125 ug/dL

## 2018-06-06 LAB — FERRITIN: Ferritin: 74 ng/mL (ref 11–307)

## 2018-06-07 ENCOUNTER — Other Ambulatory Visit: Payer: Self-pay

## 2018-06-07 ENCOUNTER — Inpatient Hospital Stay (HOSPITAL_BASED_OUTPATIENT_CLINIC_OR_DEPARTMENT_OTHER): Payer: Medicare Other | Admitting: Oncology

## 2018-06-07 ENCOUNTER — Encounter: Payer: Self-pay | Admitting: Oncology

## 2018-06-07 VITALS — BP 135/83 | HR 62 | Temp 96.4°F | Resp 18 | Wt 176.8 lb

## 2018-06-07 DIAGNOSIS — D649 Anemia, unspecified: Secondary | ICD-10-CM

## 2018-06-07 DIAGNOSIS — M069 Rheumatoid arthritis, unspecified: Secondary | ICD-10-CM

## 2018-06-07 DIAGNOSIS — Z86718 Personal history of other venous thrombosis and embolism: Secondary | ICD-10-CM

## 2018-06-07 DIAGNOSIS — I825Y2 Chronic embolism and thrombosis of unspecified deep veins of left proximal lower extremity: Secondary | ICD-10-CM | POA: Diagnosis not present

## 2018-06-07 DIAGNOSIS — D6852 Prothrombin gene mutation: Secondary | ICD-10-CM

## 2018-06-07 DIAGNOSIS — Z79899 Other long term (current) drug therapy: Secondary | ICD-10-CM

## 2018-06-07 DIAGNOSIS — Z7901 Long term (current) use of anticoagulants: Secondary | ICD-10-CM | POA: Diagnosis not present

## 2018-06-07 DIAGNOSIS — Z87891 Personal history of nicotine dependence: Secondary | ICD-10-CM

## 2018-06-07 MED ORDER — APIXABAN 2.5 MG PO TABS: 2.5000 mg | ORAL_TABLET | Freq: Two times a day (BID) | ORAL | 5 refills | Status: DC

## 2018-06-07 MED ORDER — FERROUS SULFATE 325 (65 FE) MG PO TBEC: 325.0000 mg | DELAYED_RELEASE_TABLET | Freq: Every day | ORAL | 3 refills | Status: DC

## 2018-06-07 NOTE — Progress Notes (Signed)
Hematology/Oncology Follow up note [facility] Telephone:([phone] Fax:([phone]   Patient Care Team: Leonel Ramsay, MD as PCP - General (Infectious Diseases)  REFERRING PROVIDER: Leonel Ramsay, MD CHIEF COMPLAINTS/PURPOSE OF CONSULTATION:  Evaluation of DVT  HISTORY OF PRESENTING ILLNESS:  Ruth Gray is a  72 y.o.  adult with PMH listed below who was referred to me for evaluation of DVT. Patient had first episode of provoked proximal DVT of left lower extremity (occlusive thrombus throughout the left lower extremity.) in Dec 2017, after a prolonged hospital and rehab stay after partial colon resection and creation of end ileostomy due to perforated diverticulosis.  She was started on Eliquis for anticoagulation for 3 months and came off anticoagulation.  She lives with her son in [place], and found to have left lower extremity swelling again. On 02/25/2017, patient has repeat US venous lower extremity and was found to have recurrent occlusive thrombosis of the left common femoral vein and proximal left femoral vein  She was restarted on Eliquis again.  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 There is nonocclusive thrombus throughout the popliteal vein. Patient was referred to me for decision of anticoagulation duration.  Patient also has RA following with Dr.Kernodle, on MTX and folic acid treatment.  Patient reports tolerating anticoagulation without any bleeding events.   #Hypercoagulable workup reviewed positive for single prothrombin gene mutation, heterozygote  # Repeat ultrasound showed stable appearance of chronic DVT. She has completed about 12 months of full dose anticoagulation with Eliquis, decision was made to decrease to Eliquis 2.5mg  BID for long term anticoagulation. (AMPLIFY extension trial).   INTERVAL  HISTORY MONIGUE SPRAGGINS is a 72 y.o. adult who has above history reviewed by me today presents for follow up visit for management of DVT and chronic anticoagulation.  She has completed 1 year of anticoagulation with Eliquis 5 mg twice daily.  Currently takes Eliquis 2.5 mg twice daily.  She lives in [place] She reports doing well.  No recent hospitalization or bleeding events. Denies any lower extremity swelling.  She wears anti-embolic stocking. Denies any shortness of breath, weight loss, fever or chills.  Review of Systems  Constitutional: Negative for chills, fever, malaise/fatigue and weight loss.  HENT: Negative for hearing loss, nosebleeds and sore throat.   Eyes: Negative for blurred vision, double vision, photophobia and redness.  Respiratory: Negative for cough, hemoptysis, sputum production, shortness of breath and wheezing.   Cardiovascular: Negative for chest pain, palpitations and orthopnea.  Gastrointestinal: Negative for abdominal pain, blood in stool, heartburn, nausea and vomiting.  Genitourinary: Negative for dysuria.  Musculoskeletal: Positive for joint pain. Negative for back pain, myalgias and neck pain.  Skin: Negative for itching and rash.  Neurological: Negative for dizziness, tingling, tremors, sensory change and headaches.  Endo/Heme/Allergies: Negative for environmental allergies. Does not bruise/bleed easily.  Psychiatric/Behavioral: Negative for depression, hallucinations and substance abuse. The patient is not nervous/anxious.     MEDICAL HISTORY:  Past Medical History:  Diagnosis Date  . Anemia   . Collagen vascular disease (Ruth Gray)   . DVT (deep venous thrombosis) (Ruth Gray) 10/2016   left leg  . Perforation of colon (Ruth Gray) 08/2016  . Rheumatoid arteritis     SURGICAL HISTORY: Past Surgical History:  Procedure Laterality Date  . ABDOMINAL HYSTERECTOMY    . BACK SURGERY  2005   slipped disc  lower back, [place]  . CENTRAL VENOUS CATHETER INSERTION   09/21/2016   Procedure: INSERTION CENTRAL LINE ADULT;  Surgeon: Dia Crawford III, MD;  Location: [facility];  Service: General;;  . COLON RESECTION  09/21/2016   Procedure: COLON RESECTION- Ascending and Sigmoid;  Surgeon: Dia Crawford III, MD;  Location: [facility];  Service: General;;  . COLOSTOMY N/A 09/25/2016   Procedure: COLOSTOMY;  Surgeon: Dia Crawford III, MD;  Location: [facility];  Service: General;  Laterality: N/A;  . DEBRIDEMENT OF ABDOMINAL WALL ABSCESS N/A 12/03/2016   Procedure: DEBRIDEMENT OF ABDOMINAL WALL ABSCESS;  Surgeon: Jules Husbands, MD;  Location: [facility];  Service: General;  Laterality: N/A;  . FOOT SURGERY Left   . ILEOSTOMY  08/24/2017   Procedure: ILEOSTOMY Loop Creation;  Surgeon: Clayburn Pert, MD;  Location: [facility];  Service: General;;  . ILEOSTOMY CLOSURE N/A 08/24/2017   Procedure: ILEOSTOMY REVERSAL X3;  Surgeon: Clayburn Pert, MD;  Location: [facility];  Service: General;  Laterality: N/A;  . ILEOSTOMY CLOSURE N/A 12/07/2017   Procedure: ILEOSTOMY TAKEDOWN;  Surgeon: Clayburn Pert, MD;  Location: [facility];  Service: General;  Laterality: N/A;  . JOINT REPLACEMENT Left 2012   knee  . LAPAROTOMY N/A 09/21/2016   Procedure: EXPLORATORY LAPAROTOMY;  Surgeon: Dia Crawford III, MD;  Location: [facility];  Service: General;  Laterality: N/A;  . LAPAROTOMY N/A 09/25/2016   Procedure: EXPLORATORY LAPAROTOMY and right colon resection;  Surgeon: Dia Crawford III, MD;  Location: [facility];  Service: General;  Laterality: N/A;  . LAPAROTOMY  08/24/2017   Procedure: EXPLORATORY LAPAROTOMY;  Surgeon: Clayburn Pert, MD;  Location: [facility];  Service: General;;  . REPLACEMENT TOTAL KNEE Left   . SKIN GRAFT Left    lateral lower calf above ankle    SOCIAL HISTORY: Social History   Socioeconomic History  . Marital status: Widowed    Spouse name: Not on file  . Number of children: Not on file  . Years of education: Not on file  . Highest education level: Not on file  Occupational  History  . Not on file  Social Needs  . Financial resource strain: Not on file  . Food insecurity:    Worry: Not on file    Inability: Not on file  . Transportation needs:    Medical: Not on file    Non-medical: Not on file  Tobacco Use  . Smoking status: Former Smoker    Packs/day: 0.50    Years: 35.00    Pack years: 17.50    Types: Cigarettes    Last attempt to quit: 07/15/2016    Years since quitting: 1.8  . Smokeless tobacco: Never Used  Substance and Sexual Activity  . Alcohol use: No  . Drug use: No  . Sexual activity: Never  Lifestyle  . Physical activity:    Days per week: Not on file    Minutes per session: Not on file  . Stress: Not on file  Relationships  . Social connections:    Talks on phone: Not on file    Gets together: Not on file    Attends religious service: Not on file    Active member of club or organization: Not on file    Attends meetings of clubs or organizations: Not on file    Relationship status: Not on file  . Intimate partner violence:    Fear of current or ex partner: Not on file    Emotionally abused: Not on  file    Physically abused: Not on file    Forced sexual activity: Not on file  Other Topics Concern  . Not on file  Social History Narrative  . Not on file    FAMILY HISTORY: Family History  Problem Relation Age of Onset  . Breast cancer Other 36  . Prostate cancer Father   . Heart attack Father   . Ovarian cancer Sister   . Kidney cancer Brother   . Lung cancer Sister   . Kidney disease Mother     ALLERGIES:  is allergic to ciprofloxacin; tramadol; and penicillins.  MEDICATIONS:  Current Outpatient Medications  Medication Sig Dispense Refill  . acetaminophen (TYLENOL) 500 MG tablet Take 1,000 mg by mouth every 8 (eight) hours as needed.     Marland Kitchen apixaban (ELIQUIS) 2.5 MG TABS tablet Take 1 tablet (2.5 mg total) by mouth 2 (two) times daily. 60 tablet 3  . Calcium Carb-Cholecalciferol (CALCIUM 600+D3 PO) Take 2 tablets  by mouth daily.    . carbonyl iron (FEOSOL) 45 MG TABS tablet Take 45 mg by mouth every Monday, Wednesday, and Friday. In the morning.    . ferrous sulfate 325 (65 FE) MG EC tablet Take 1 tablet (325 mg total) by mouth 2 (two) times daily with a meal. 60 tablet 3  . folic acid (FOLVITE) 1 MG tablet Take 2 tablets by mouth 1 day or 1 dose.  11  . methotrexate 50 MG/2ML injection Inject 0.8 mL subcutaneously once a week on Sundays.    . metoprolol tartrate (LOPRESSOR) 25 MG tablet TAKE 1/2 TABLET (12.5 MG TOTAL) BY MOUTH 2 (TWO) TIMES DAILY.  11  . oxyCODONE-acetaminophen (PERCOCET/ROXICET) 5-325 MG tablet Take 1-2 tablets by mouth every 4 (four) hours as needed for moderate pain. 20 tablet 0   No current facility-administered medications for this visit.      PHYSICAL EXAMINATION: ECOG PERFORMANCE STATUS: 0 - Asymptomatic Vitals:   06/07/18 1025  BP: 135/83  Pulse: 62  Resp: 18  Temp: (!) 96.4 F (35.8 C)  SpO2: 99%   Filed Weights   06/07/18 1025  Weight: 176 lb 12.8 oz (80.2 kg)    Physical Exam  Constitutional: She is oriented to person, place, and time and well-developed, well-nourished, and in no distress. No distress.  HENT:  Head: Normocephalic and atraumatic.  Nose: Nose normal.  Mouth/Throat: Oropharynx is clear and moist. No oropharyngeal exudate.  Eyes: Pupils are equal, round, and reactive to light. Conjunctivae and EOM are normal. Left eye exhibits no discharge. No scleral icterus.  Neck: Normal range of motion. Neck supple. No JVD present.  Cardiovascular: Normal rate, regular rhythm and normal heart sounds. Exam reveals no friction rub.  No murmur heard. Pulmonary/Chest: Effort normal and breath sounds normal. No respiratory distress. She has no rales. She exhibits no tenderness.  Abdominal: Soft. Bowel sounds are normal. She exhibits no distension and no mass. There is no tenderness.   Ventral  hernia  Musculoskeletal: Normal range of motion. She exhibits no  edema.  Lymphadenopathy:    She has no cervical adenopathy.  Neurological: She is alert and oriented to person, place, and time. She displays normal reflexes. No cranial nerve deficit. She exhibits normal muscle tone. Coordination normal.  Skin: Skin is warm and dry. She is not diaphoretic. No erythema.  Psychiatric: Affect normal.     LABORATORY DATA:  I have reviewed the data as listed Lab Results  Component Value Date   WBC 5.2 06/06/2018  HGB 10.7 (L) 06/06/2018   HCT 31.9 (L) 06/06/2018   MCV 96.0 06/06/2018   PLT 273 06/06/2018   Recent Labs    08/27/17 0513  11/08/17 1210  12/08/17 0509 12/09/17 0432 02/07/18 1104  NA 138   < > 137  --  133* 135 135  K 3.5   < > 4.2  --  4.7 3.7 4.6  CL 107   < > 104  --  103 104 104  CO2 27   < > 24  --  22 24 24   GLUCOSE 142*   < > 86  --  120* 94 94  BUN 14   < > 15  --  14 10 19   CREATININE 1.17*   < > 1.09*   < > 1.07* 1.09* 1.06*  CALCIUM 8.3*   < > 8.8*  --  8.3* 8.2* 8.9  GFRNONAA 46*   < > 50*   < > 51* 50* 52*  GFRAA 53*   < > 58*   < > 60* 58* >60  PROT 5.8*  --  7.7  --   --   --  7.7  ALBUMIN 2.1*  --  3.4*  --   --   --  3.4*  AST 18  --  23  --   --   --  22  ALT 10*  --  12*  --   --   --  11*  ALKPHOS 54  --  88  --   --   --  95  BILITOT 0.9  --  0.5  --   --   --  0.7   < > = values in this interval not displayed.    RADIOGRAPHIC STUDIES: I have personally reviewed the radiological images as listed and agreed with the findings in the report. US venous lower extremity on 10/29/2016, 02/25/2017, 10/27/2017.   ASSESSMENT & PLAN:  1. Anemia, unspecified type   2. Chronic deep vein thrombosis (DVT) of proximal vein of left lower extremity (HCC)   3. Prothrombin gene mutation [facility])    #Chronic anticoagulation for history of recurrent DVT:  Tolerates Eliquis 2.5 mg twice daily.  Continue long-term. Refills sent #Heterozygous prothrombin gene mutation, discussed with patient that this result does not change her  anticoagulation management.  Her first relatives may need to be screened.  She voices understanding.  #Normocytic anemia, iron panel reviewed.  Saturation 56, ferritin 74, TIBC 285.  Consistent with an anemia of chronic disease. Discussed with patient that anemia is most likely secondary to chronic kidney disease.  Currently she does not need erythropoietin treatment.  She may continue her ferrous sulfate, decrease from twice a day to daily. She voices understanding.  All questions were answered. The patient knows to call the clinic with any problems questions or concerns.  Return of visit: 6 months  Earlie Server, MD, PhD Hematology Oncology [facility] Pager- [phone] 06/07/2018

## 2018-06-07 NOTE — Progress Notes (Signed)
Patient here for follow up

## 2018-07-06 ENCOUNTER — Other Ambulatory Visit: Payer: Self-pay | Admitting: Oncology

## 2018-07-25 ENCOUNTER — Other Ambulatory Visit: Payer: Self-pay | Admitting: Internal Medicine

## 2018-07-26 ENCOUNTER — Other Ambulatory Visit: Payer: Self-pay | Admitting: Internal Medicine

## 2018-07-26 DIAGNOSIS — Z1231 Encounter for screening mammogram for malignant neoplasm of breast: Secondary | ICD-10-CM

## 2018-08-05 ENCOUNTER — Telehealth: Payer: Self-pay

## 2018-08-05 NOTE — Telephone Encounter (Signed)
Please mail appt. To [address] [place] [id number]

## 2018-08-05 NOTE — Telephone Encounter (Signed)
Call pt regarding lung screening. Pt would like scan to be on Oct. 28 afternoon. Pt is a former smoker.

## 2018-08-07 ENCOUNTER — Other Ambulatory Visit: Payer: Self-pay | Admitting: *Deleted

## 2018-08-07 DIAGNOSIS — Z122 Encounter for screening for malignant neoplasm of respiratory organs: Secondary | ICD-10-CM

## 2018-08-08 ENCOUNTER — Ambulatory Visit
Admission: RE | Admit: 2018-08-08 | Discharge: 2018-08-08 | Disposition: A | Discharge status: Home / Self Care | Payer: Medicare Other | Source: Ambulatory Visit | Attending: Internal Medicine | Admitting: Internal Medicine

## 2018-08-08 DIAGNOSIS — Z1231 Encounter for screening mammogram for malignant neoplasm of breast: Secondary | ICD-10-CM | POA: Insufficient documentation

## 2018-08-14 ENCOUNTER — Telehealth: Payer: Self-pay | Admitting: *Deleted

## 2018-08-14 NOTE — Telephone Encounter (Signed)
Called patient to discuss appt for LDCT screening on Monday 09/18/2018 @ 11:45am here @ [facility], voiced understanding/

## 2018-08-26 IMAGING — US US EXTREM LOW VENOUS*L*
1 series · 14 of 24 positions shown · non-contrast
Comparison: None.

CLINICAL DATA: Left leg swelling.

EXAM:
LEFT LOWER EXTREMITY VENOUS DOPPLER ULTRASOUND
TECHNIQUE: Gray-scale sonography with graded compression, as well as color
Doppler and duplex ultrasound, were performed to evaluate the deep
venous system from the level of the common femoral vein through the
popliteal and proximal calf veins. Spectral Doppler was utilized to
evaluate flow at rest and with distal augmentation maneuvers.

[Series 1: us extrem low venous*left* · 0.07mm/px · 14 of 32 slices shown]
[im 1/32]
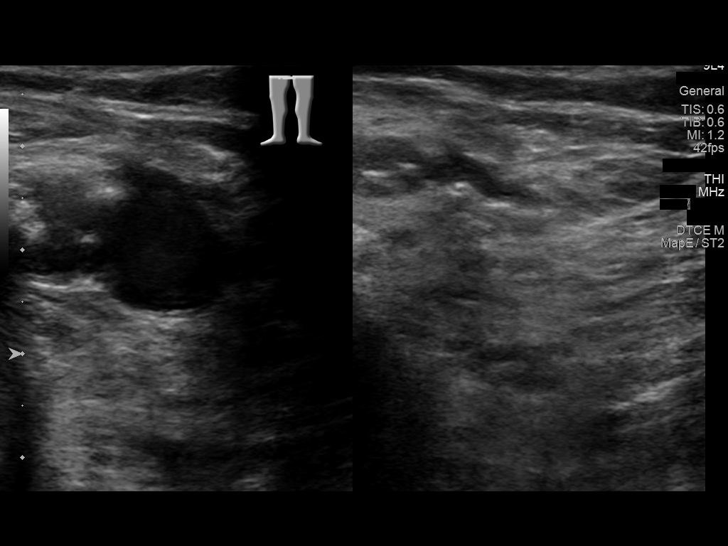
[im 3/32]
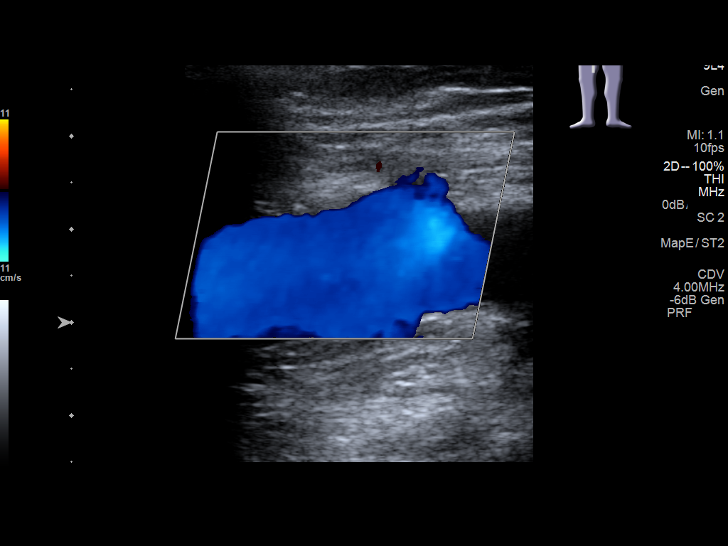
[im 6/32]
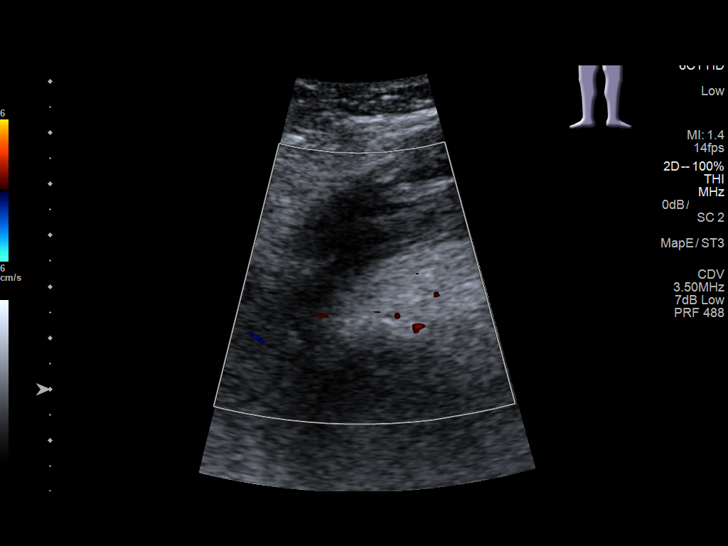
[im 9/32]
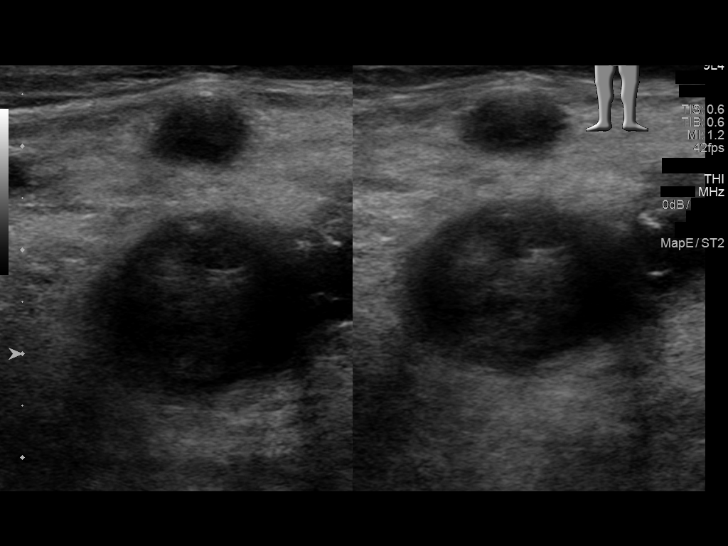
[im 10/32]
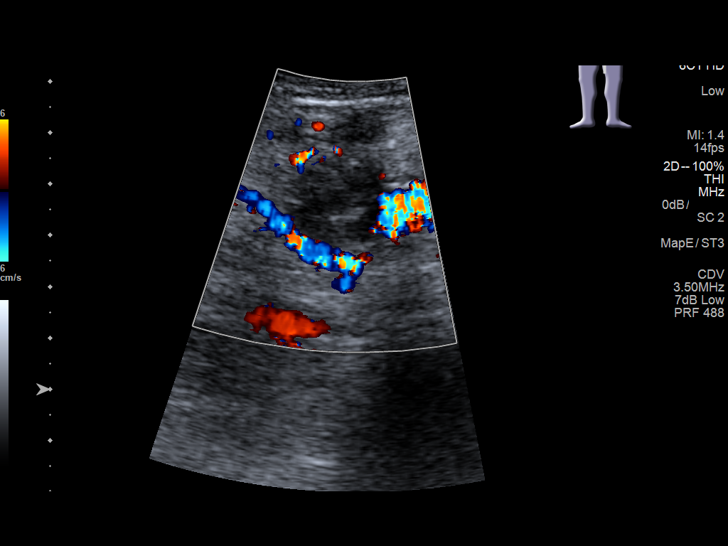
[im 13/32]
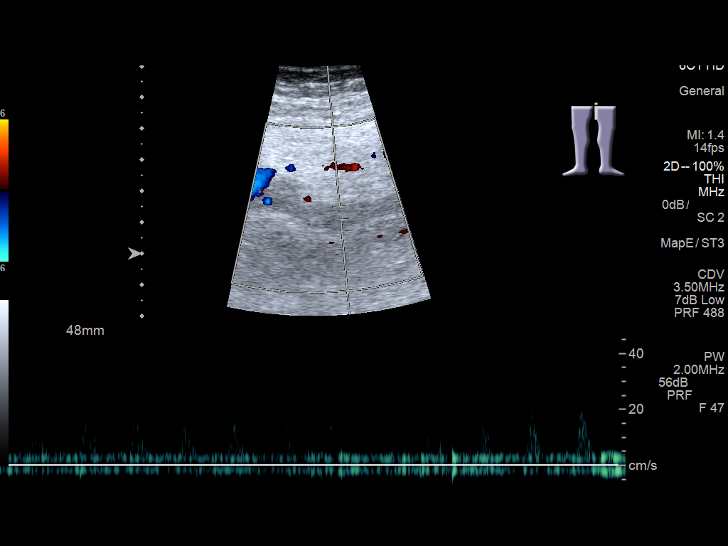
[im 15/32]
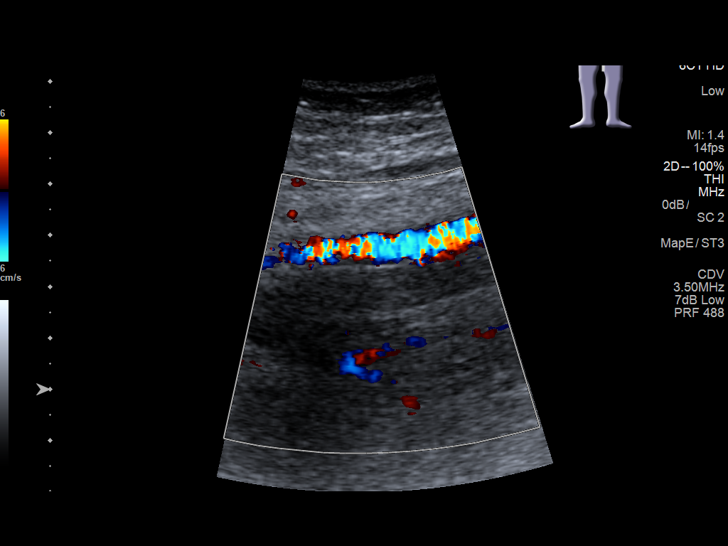
[im 17/32]
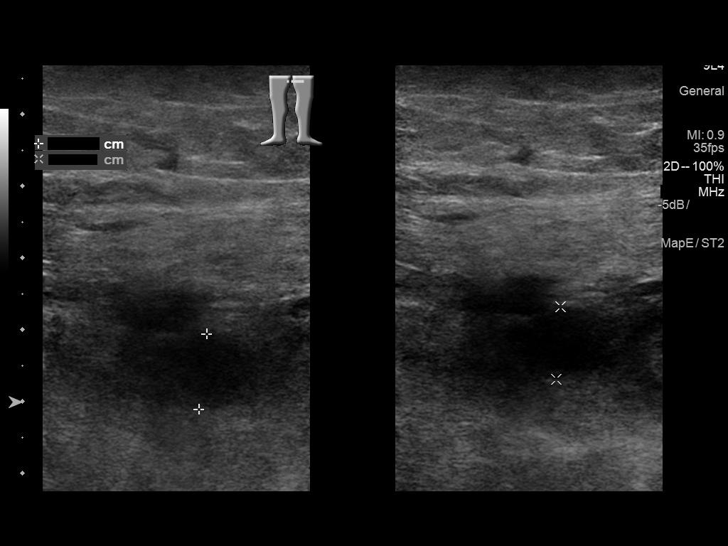
[im 19/32]
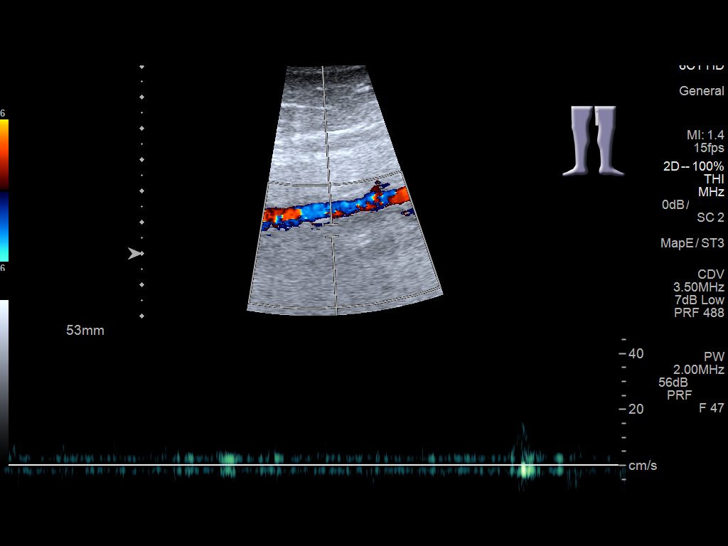
[im 22/32]
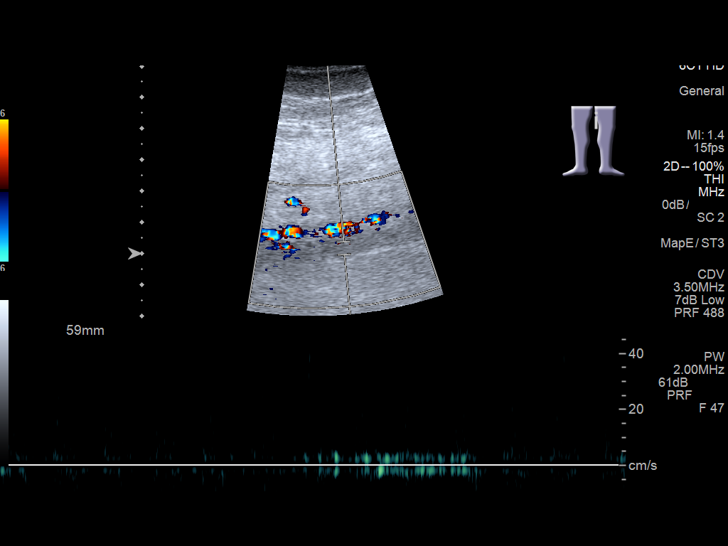
[im 25/32]
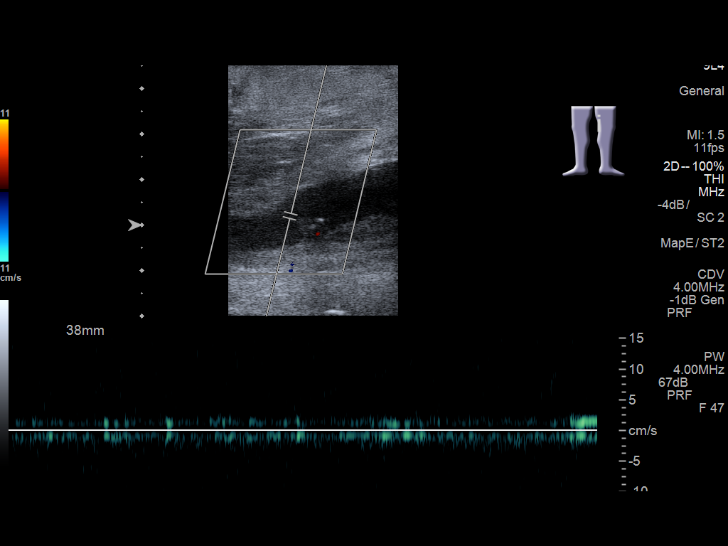
[im 26/32]
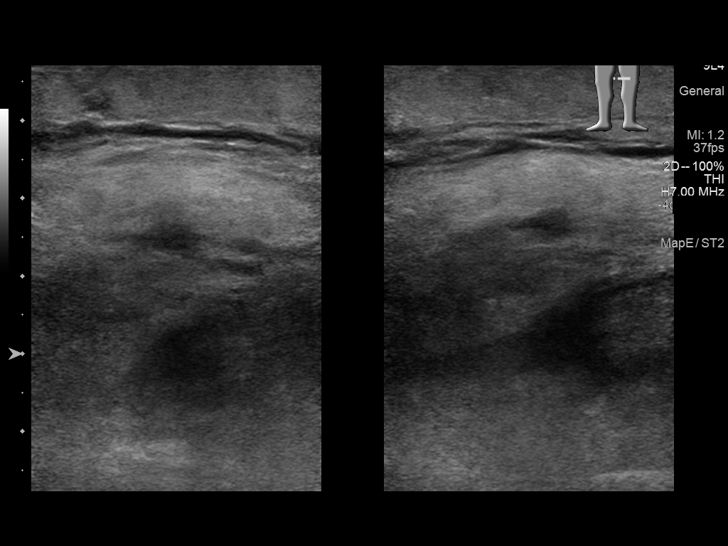
[im 29/32]
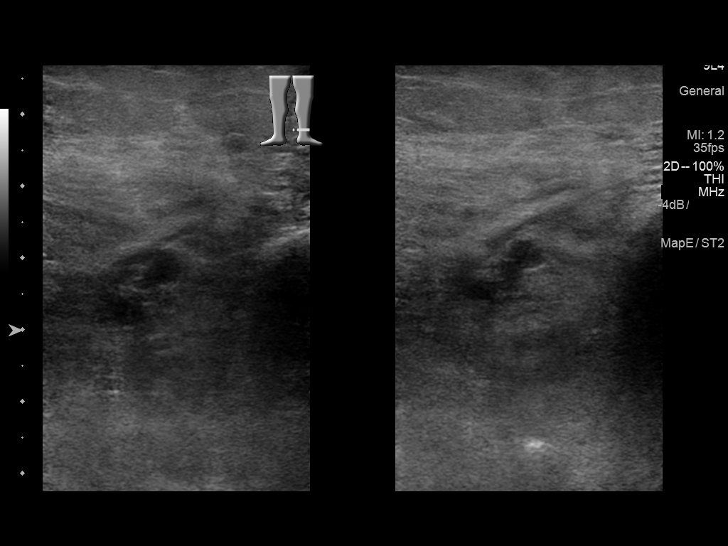
[im 32/32]
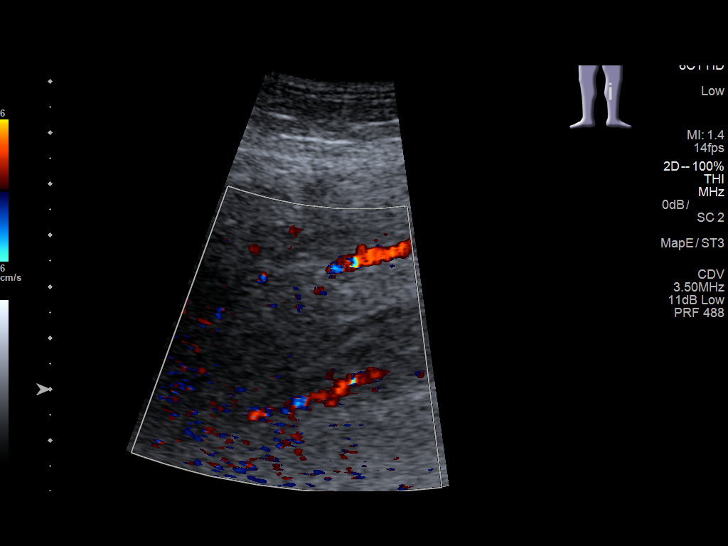

[14 of 24 positions shown; findings below may reference images not displayed]

FINDINGS: Right common femoral vein is patent without thrombus.

There is occlusive thrombus throughout the left lower extremity.
There is thrombus in the left common femoral vein, left profunda
femoral vein, left saphenofemoral junction, left femoral vein, left
popliteal vein and the visualized left posterior tibial veins and
left peroneal veins. The left common femoral vein is enlarged and
this is suggestive for acute thrombus.
IMPRESSION: Positive for deep venous thrombosis throughout the left lower
extremity. Clot extends from the left calf to the left common
femoral vein. Cannot exclude extension into the left iliac veins.

## 2018-09-06 ENCOUNTER — Telehealth: Payer: Self-pay | Admitting: *Deleted

## 2018-09-06 NOTE — Telephone Encounter (Signed)
Notified patient that annual lung cancer screening low dose CT scan is due currently or will be in near future. Confirmed that patient is within the age range of 55-77, and asymptomatic, (no signs or symptoms of lung cancer). Patient denies illness that would prevent curative treatment for lung cancer if found. Verified smoking history, (former, quit Sept 2017, 31.25 pack year). The shared decision making visit was done 05/29/15. Patient is agreeable for CT scan being scheduled. Appt made and Patient aware of appt on 09-18-18@12pm 

## 2018-09-18 ENCOUNTER — Ambulatory Visit
Admission: RE | Admit: 2018-09-18 | Discharge: 2018-09-18 | Disposition: A | Discharge status: Home / Self Care | Payer: Medicare Other | Source: Ambulatory Visit | Attending: Oncology | Admitting: Oncology

## 2018-09-18 DIAGNOSIS — J432 Centrilobular emphysema: Secondary | ICD-10-CM | POA: Insufficient documentation

## 2018-09-18 DIAGNOSIS — I700 Atherosclerosis of aorta: Secondary | ICD-10-CM | POA: Insufficient documentation

## 2018-09-18 DIAGNOSIS — Z122 Encounter for screening for malignant neoplasm of respiratory organs: Secondary | ICD-10-CM | POA: Insufficient documentation

## 2018-09-18 DIAGNOSIS — I2510 Atherosclerotic heart disease of native coronary artery without angina pectoris: Secondary | ICD-10-CM | POA: Insufficient documentation

## 2018-09-18 DIAGNOSIS — Z87891 Personal history of nicotine dependence: Secondary | ICD-10-CM | POA: Insufficient documentation

## 2018-09-19 ENCOUNTER — Encounter: Payer: Self-pay | Admitting: *Deleted

## 2018-12-08 ENCOUNTER — Inpatient Hospital Stay: Payer: Medicare Other | Attending: Oncology

## 2018-12-08 ENCOUNTER — Other Ambulatory Visit: Payer: Self-pay

## 2018-12-08 ENCOUNTER — Encounter: Payer: Self-pay | Admitting: Oncology

## 2018-12-08 ENCOUNTER — Encounter (INDEPENDENT_AMBULATORY_CARE_PROVIDER_SITE_OTHER): Payer: Self-pay

## 2018-12-08 ENCOUNTER — Inpatient Hospital Stay (HOSPITAL_BASED_OUTPATIENT_CLINIC_OR_DEPARTMENT_OTHER): Payer: Medicare Other | Admitting: Oncology

## 2018-12-08 VITALS — BP 123/78 | HR 67 | Temp 96.7°F | Resp 18 | Wt 195.1 lb

## 2018-12-08 DIAGNOSIS — Z79899 Other long term (current) drug therapy: Secondary | ICD-10-CM | POA: Insufficient documentation

## 2018-12-08 DIAGNOSIS — D638 Anemia in other chronic diseases classified elsewhere: Secondary | ICD-10-CM | POA: Diagnosis not present

## 2018-12-08 DIAGNOSIS — I825Y2 Chronic embolism and thrombosis of unspecified deep veins of left proximal lower extremity: Secondary | ICD-10-CM | POA: Diagnosis not present

## 2018-12-08 DIAGNOSIS — Z8041 Family history of malignant neoplasm of ovary: Secondary | ICD-10-CM | POA: Diagnosis not present

## 2018-12-08 DIAGNOSIS — Z801 Family history of malignant neoplasm of trachea, bronchus and lung: Secondary | ICD-10-CM | POA: Insufficient documentation

## 2018-12-08 DIAGNOSIS — R635 Abnormal weight gain: Secondary | ICD-10-CM | POA: Diagnosis not present

## 2018-12-08 DIAGNOSIS — Z87891 Personal history of nicotine dependence: Secondary | ICD-10-CM

## 2018-12-08 DIAGNOSIS — Z8051 Family history of malignant neoplasm of kidney: Secondary | ICD-10-CM | POA: Diagnosis not present

## 2018-12-08 DIAGNOSIS — Z803 Family history of malignant neoplasm of breast: Secondary | ICD-10-CM | POA: Insufficient documentation

## 2018-12-08 DIAGNOSIS — M069 Rheumatoid arthritis, unspecified: Secondary | ICD-10-CM | POA: Insufficient documentation

## 2018-12-08 DIAGNOSIS — Z7901 Long term (current) use of anticoagulants: Secondary | ICD-10-CM | POA: Insufficient documentation

## 2018-12-08 DIAGNOSIS — D6852 Prothrombin gene mutation: Secondary | ICD-10-CM

## 2018-12-08 DIAGNOSIS — D649 Anemia, unspecified: Secondary | ICD-10-CM

## 2018-12-08 LAB — CBC WITH DIFFERENTIAL/PLATELET
ABS IMMATURE GRANULOCYTES: 0.01 10*3/uL (ref 0.00–0.07)
BASOS ABS: 0 10*3/uL (ref 0.0–0.1)
BASOS PCT: 0 %
Eosinophils Absolute: 0.1 10*3/uL (ref 0.0–0.5)
Eosinophils Relative: 2 %
HEMATOCRIT: 36.2 % (ref 36.0–46.0)
Hemoglobin: 11.3 g/dL — ABNORMAL LOW (ref 12.0–15.0)
Immature Granulocytes: 0 %
LYMPHS PCT: 22 %
Lymphs Abs: 1.3 10*3/uL (ref 0.7–4.0)
MCH: 29.5 pg (ref 26.0–34.0)
MCHC: 31.2 g/dL (ref 30.0–36.0)
MCV: 94.5 fL (ref 80.0–100.0)
MONO ABS: 0.5 10*3/uL (ref 0.1–1.0)
Monocytes Relative: 9 %
Neutro Abs: 3.7 10*3/uL (ref 1.7–7.7)
Neutrophils Relative %: 67 %
PLATELETS: 217 10*3/uL (ref 150–400)
RBC: 3.83 MIL/uL — AB (ref 3.87–5.11)
RDW: 12.5 % (ref 11.5–15.5)
WBC: 5.6 10*3/uL (ref 4.0–10.5)
nRBC: 0 % (ref 0.0–0.2)

## 2018-12-08 LAB — COMPREHENSIVE METABOLIC PANEL
ALT: 14 U/L (ref 0–44)
AST: 21 U/L (ref 15–41)
Albumin: 3.6 g/dL (ref 3.5–5.0)
Alkaline Phosphatase: 90 U/L (ref 38–126)
Anion gap: 8 (ref 5–15)
BILIRUBIN TOTAL: 0.4 mg/dL (ref 0.3–1.2)
BUN: 20 mg/dL (ref 8–23)
CO2: 26 mmol/L (ref 22–32)
CREATININE: 1.02 mg/dL — AB (ref 0.44–1.00)
Calcium: 9.2 mg/dL (ref 8.9–10.3)
Chloride: 106 mmol/L (ref 98–111)
GFR calc Af Amer: >60 mL/min (ref >60)
GFR, EST NON AFRICAN AMERICAN: 55 mL/min — AB (ref >60)
Glucose, Bld: 97 mg/dL (ref 70–99)
Potassium: 4.1 mmol/L (ref 3.5–5.1)
Sodium: 140 mmol/L (ref 135–145)
TOTAL PROTEIN: 7.6 g/dL (ref 6.5–8.1)

## 2018-12-08 MED ORDER — APIXABAN 2.5 MG PO TABS: 2.5000 mg | ORAL_TABLET | Freq: Two times a day (BID) | ORAL | 5 refills | Status: DC

## 2018-12-08 NOTE — Progress Notes (Signed)
Patient here for follow up. Pt states she has noticed weight gain. Weight currently 195.1, verified x3.

## 2018-12-09 NOTE — Progress Notes (Signed)
Hematology/Oncology Follow up note [facility] [phone] [phone]   Patient Care Team: Baxter Hire, MD as PCP - General (Internal Medicine)  REFERRING PROVIDER: Baxter Hire, MD CHIEF COMPLAINTS/PURPOSE OF CONSULTATION:  Evaluation of DVT  HISTORY OF PRESENTING ILLNESS:  Ruth Gray is a  73 y.o.  adult with PMH listed below who was referred to me for evaluation of DVT. Patient had first episode of provoked proximal DVT of left lower extremity (occlusive thrombus throughout the left lower extremity.) in Dec 2017, after a prolonged hospital and rehab stay after partial colon resection and creation of end ileostomy due to perforated diverticulosis.  She was started on Eliquis for anticoagulation for 3 months and came off anticoagulation.  She lives with her son in [place], and found to have left lower extremity swelling again. On 02/25/2017, patient has repeat US venous lower extremity and was found to have recurrent occlusive thrombosis of the left common femoral vein and proximal left femoral vein  She was restarted on Eliquis again.  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 There is nonocclusive thrombus throughout the popliteal vein. Patient was referred to me for decision of anticoagulation duration.  Patient also has RA following with Dr.Kernodle, on MTX and folic acid treatment.  Patient reports tolerating anticoagulation without any bleeding events.   #Hypercoagulable workup reviewed positive for single prothrombin gene mutation, heterozygote  # Repeat ultrasound showed stable appearance of chronic DVT. She has completed about 12 months of full dose anticoagulation with Eliquis, decision was made to decrease to Eliquis 2.5mg  BID for long term anticoagulation. (AMPLIFY extension trial).   INTERVAL HISTORY Ruth Gray is a 73 y.o. adult who has above history reviewed by me today presents for follow up visit for management of DVT and chronic anticoagulation.  Patient is on long term anticoagulation with Eliquis 2.5mg  BID, tolerates well.  Denies hematochezia, hematuria, hematemesis, epistaxis, black tarry stool or easy bruising.  Denies SOB, chest pain, weight loss, fever or chills.  She has gained 20 pounds during the past 6 months.   .Review of Systems  Constitutional: Negative for chills, fever, malaise/fatigue and weight loss.  HENT: Negative for hearing loss, nosebleeds and sore throat.   Eyes: Negative for blurred vision, double vision, photophobia and redness.  Respiratory: Negative for cough, hemoptysis, sputum production, shortness of breath and wheezing.   Cardiovascular: Negative for chest pain, palpitations, orthopnea and leg swelling.  Gastrointestinal: Negative for abdominal pain, blood in stool, heartburn, nausea and vomiting.  Genitourinary: Negative for dysuria.  Musculoskeletal: Positive for joint pain. Negative for back pain, myalgias and neck pain.  Skin: Negative for itching and rash.  Neurological: Negative for dizziness, tingling, tremors, sensory change and headaches.  Endo/Heme/Allergies: Negative for environmental allergies. Does not bruise/bleed easily.  Psychiatric/Behavioral: Negative for depression, hallucinations and substance abuse. The patient is not nervous/anxious.     MEDICAL HISTORY:  Past Medical History:  Diagnosis Date  . Anemia   . Collagen vascular disease ([place])   . DVT (deep venous thrombosis) ([place]) 10/2016   left leg  . Perforation of colon ([place]) 08/2016  . Rheumatoid arteritis ([place])     SURGICAL HISTORY: Past Surgical History:  Procedure Laterality Date  . ABDOMINAL HYSTERECTOMY    . BACK SURGERY  2005   slipped disc lower back, [place]  . CENTRAL VENOUS  CATHETER INSERTION  09/21/2016   Procedure: INSERTION CENTRAL LINE ADULT;  Surgeon:  Dia Crawford III, MD;  Location: [facility] ORS;  Service: General;;  . COLON RESECTION  09/21/2016   Procedure: COLON RESECTION- Ascending and Sigmoid;  Surgeon: Dia Crawford III, MD;  Location: [facility] OR[facility];  Service: General;;  . COLOSTOMY N/A 09/25/2016   Procedure: COLOSTOMY;  Surgeon: Dia Crawford III, MD;  Location: [facility];  Service: General;  Laterality: N/A;  . DEBRIDEMENT OF ABDOMINAL WALL ABSCESS N/A 12/03/2016   Procedure: DEBRIDEMENT OF ABDOMINAL WALL ABSCESS;  Surgeon: Jules Husbands, MD;  Location: [facility];  Service: General;  Laterality: N/A;  . FOOT SURGERY Left   . ILEOSTOMY  08/24/2017   Procedure: ILEOSTOMY Loop Creation;  Surgeon: Clayburn Pert, MD;  Location: [facility];  Service: General;;  . ILEOSTOMY CLOSURE N/A 08/24/2017   Procedure: ILEOSTOMY REVERSAL X3;  Surgeon: Clayburn Pert, MD;  Location: [facility];  Service: General;  Laterality: N/A;  . ILEOSTOMY CLOSURE N/A 12/07/2017   Procedure: ILEOSTOMY TAKEDOWN;  Surgeon: Clayburn Pert, MD;  Location: [facility];  Service: General;  Laterality: N/A;  . JOINT REPLACEMENT Left 2012   knee  . LAPAROTOMY N/A 09/21/2016   Procedure: EXPLORATORY LAPAROTOMY;  Surgeon: Dia Crawford III, MD;  Location: [facility];  Service: General;  Laterality: N/A;  . LAPAROTOMY N/A 09/25/2016   Procedure: EXPLORATORY LAPAROTOMY and right colon resection;  Surgeon: Dia Crawford III, MD;  Location: [facility];  Service: General;  Laterality: N/A;  . LAPAROTOMY  08/24/2017   Procedure: EXPLORATORY LAPAROTOMY;  Surgeon: Clayburn Pert, MD;  Location: [facility];  Service: General;;  . REPLACEMENT TOTAL KNEE Left   . SKIN GRAFT Left    lateral lower calf above ankle    SOCIAL HISTORY: Social History   Socioeconomic History  . Marital status: Widowed    Spouse name: Not on file  . Number of children: Not on file  . Years of education: Not on file  . Highest education level: Not on file  Occupational History  . Not on file  Social Needs  . Financial resource  strain: Not on file  . Food insecurity:    Worry: Not on file    Inability: Not on file  . Transportation needs:    Medical: Not on file    Non-medical: Not on file  Tobacco Use  . Smoking status: Former Smoker    Packs/day: 0.50    Years: 35.00    Pack years: 17.50    Types: Cigarettes    Last attempt to quit: 07/15/2016    Years since quitting: 2.4  . Smokeless tobacco: Never Used  Substance and Sexual Activity  . Alcohol use: No  . Drug use: No  . Sexual activity: Never  Lifestyle  . Physical activity:    Days per week: Not on file    Minutes per session: Not on file  . Stress: Not on file  Relationships  . Social connections:    Talks on phone: Not on file    Gets together: Not on file    Attends religious service: Not on file    Active member of club or organization: Not on file    Attends meetings of clubs or organizations: Not on file    Relationship status: Not on file  . Intimate partner violence:    Fear of current or ex partner: Not on file    Emotionally abused: Not on file    Physically abused: Not on  file    Forced sexual activity: Not on file  Other Topics Concern  . Not on file  Social History Narrative  . Not on file    FAMILY HISTORY: Family History  Problem Relation Age of Onset  . Breast cancer Other 36  . Prostate cancer Father   . Heart attack Father   . Ovarian cancer Sister   . Kidney cancer Brother   . Lung cancer Sister   . Kidney disease Mother     ALLERGIES:  is allergic to ciprofloxacin; tramadol; and penicillins.  MEDICATIONS:  Current Outpatient Medications  Medication Sig Dispense Refill  . acetaminophen (TYLENOL) 500 MG tablet Take 1,000 mg by mouth every 8 (eight) hours as needed.     Marland Kitchen apixaban (ELIQUIS) 2.5 MG TABS tablet Take 1 tablet (2.5 mg total) by mouth 2 (two) times daily. 60 tablet 5  . Calcium Carb-Cholecalciferol (CALCIUM 600+D3 PO) Take 2 tablets by mouth daily.    . carbonyl iron (FEOSOL) 45 MG TABS tablet  Take 45 mg by mouth every Monday, Wednesday, and Friday. In the morning.    . ferrous sulfate 325 (65 FE) MG EC tablet Take 1 tablet (325 mg total) by mouth daily. 60 tablet 3  . folic acid (FOLVITE) 1 MG tablet Take 2 tablets by mouth 1 day or 1 dose.  11  . hydroxychloroquine (PLAQUENIL) 200 MG tablet TAKE 1 TABLET BY MOUTH EVERY DAY    . methotrexate 50 MG/2ML injection Inject 0.8 mL subcutaneously once a week on Sundays.    Marland Kitchen oxyCODONE-acetaminophen (PERCOCET/ROXICET) 5-325 MG tablet Take 1-2 tablets by mouth every 4 (four) hours as needed for moderate pain. 20 tablet 0  . metoprolol tartrate (LOPRESSOR) 25 MG tablet TAKE 1/2 TABLET (12.5 MG TOTAL) BY MOUTH 2 (TWO) TIMES DAILY.  11   No current facility-administered medications for this visit.      PHYSICAL EXAMINATION: ECOG PERFORMANCE STATUS: 0 - Asymptomatic Vitals:   12/08/18 1003  BP: 123/78  Pulse: 67  Resp: 18  Temp: (!) 96.7 F (35.9 C)   Filed Weights   12/08/18 1003  Weight: 195 lb 1.6 oz (88.5 kg)    Physical Exam  Constitutional: She is oriented to person, place, and time and well-developed, well-nourished, and in no distress. No distress.  HENT:  Head: Normocephalic and atraumatic.  Nose: Nose normal.  Mouth/Throat: Oropharynx is clear and moist. No oropharyngeal exudate.  Eyes: Pupils are equal, round, and reactive to light. Conjunctivae and EOM are normal. Left eye exhibits no discharge. No scleral icterus.  Neck: Normal range of motion. Neck supple. No JVD present.  Cardiovascular: Normal rate, regular rhythm and normal heart sounds. Exam reveals no friction rub.  No murmur heard. Pulmonary/Chest: Effort normal and breath sounds normal. No respiratory distress. She has no rales. She exhibits no tenderness.  Abdominal: Soft. Bowel sounds are normal. She exhibits no distension and no mass. There is no abdominal tenderness.   Ventral  hernia  Musculoskeletal: Normal range of motion.        General: No edema.   Lymphadenopathy:    She has no cervical adenopathy.  Neurological: She is alert and oriented to person, place, and time. She displays normal reflexes. No cranial nerve deficit. She exhibits normal muscle tone. Coordination normal.  Skin: Skin is warm and dry. She is not diaphoretic. No erythema.  Psychiatric: Affect normal.     LABORATORY DATA:  I have reviewed the data as listed Lab Results  Component Value  Date   WBC 5.6 12/08/2018   HGB 11.3 (L) 12/08/2018   HCT 36.2 12/08/2018   MCV 94.5 12/08/2018   PLT 217 12/08/2018   Recent Labs    02/07/18 1104 12/08/18 0942  NA 135 140  K 4.6 4.1  CL 104 106  CO2 24 26  GLUCOSE 94 97  BUN 19 20  CREATININE 1.06* 1.02*  CALCIUM 8.9 9.2  GFRNONAA 52* 55*  GFRAA >60 >60  PROT 7.7 7.6  ALBUMIN 3.4* 3.6  AST 22 21  ALT 11* 14  ALKPHOS 95 90  BILITOT 0.7 0.4    RADIOGRAPHIC STUDIES: I have personally reviewed the radiological images as listed and agreed with the findings in the report. US venous lower extremity on 10/29/2016, 02/25/2017, 10/27/2017.   ASSESSMENT & PLAN:  1. Chronic deep vein thrombosis (DVT) of proximal vein of left lower extremity ([place])   2. Prothrombin gene mutation ([place])   3. Anemia of chronic disease   4. Weight gain    #Chronic anticoagulation for history of recurrent DVT:  Tolerates Eliquis 2.5 mg twice daily. Continue anticoagulation. RX sent.   #Heterozygous prothrombin gene mutation, discussed with patient that this result does not change her anticoagulation management.  Her first relatives may need to be screened.  She voices understanding.  #Anermia of chronic disease, on ferrous sulfate. Hemoglobin improved.  # Weight gain:  etiology unknown. Discussed with patient about watch calorie intake and increased activity. Advise pt to discuss with PCP.  The patient knows to call the clinic with any problems questions or concerns.  Return of visit: 6 months  Earlie Server, MD, PhD Hematology  Oncology [facility] at [facility] Pager- [phone] 12/09/2018

## 2019-04-23 ENCOUNTER — Encounter: Payer: Self-pay | Admitting: Emergency Medicine

## 2019-04-23 ENCOUNTER — Ambulatory Visit
Admission: EM | Admit: 2019-04-23 | Discharge: 2019-04-23 | Disposition: A | Discharge status: Home / Self Care | Payer: Medicare Other | Attending: Family Medicine | Admitting: Family Medicine

## 2019-04-23 ENCOUNTER — Other Ambulatory Visit: Payer: Self-pay

## 2019-04-23 DIAGNOSIS — N61 Mastitis without abscess: Secondary | ICD-10-CM | POA: Diagnosis not present

## 2019-04-23 MED ORDER — DOXYCYCLINE HYCLATE 100 MG PO TABS: 100.0000 mg | ORAL_TABLET | Freq: Two times a day (BID) | ORAL | 0 refills | Status: DC

## 2019-04-23 NOTE — ED Triage Notes (Signed)
Patient states she has a sore under her left breast.  States it is painful and began draining this morning

## 2019-04-23 NOTE — ED Provider Notes (Signed)
[facility]    CSN: [id number] Arrival date & time: 04/23/19  3474     History   Chief Complaint Chief Complaint  Patient presents with  . Abscess    HPI Ruth Gray is a 73 y.o. adult.   73 yo female with a c/o a "sore" under her left breast for about 3 days with slight pus drainage since this morning. Denies any fevers, chills. States gets physicals and yearly mammograms with PCP.   The history is provided by the patient.  Abscess    Past Medical History:  Diagnosis Date  . Anemia   . Collagen vascular disease ([facility])   . DVT (deep venous thrombosis) ([facility]) 10/2016   left leg  . Perforation of colon ([facility]) 08/2016  . Rheumatoid arteritis [facility])     Patient Active Problem List   Diagnosis Date Noted  . H/O ileostomy 12/07/2017  . Ileostomy in place [facility]) 06/17/2017  . Surgical wound, non healing 03/15/2017  . Neutropenia ([facility]) 02/25/2017  . Abscess of abdominal wall 12/02/2016  . Acute deep vein thrombosis (DVT) of distal vein of left lower extremity (H[facility]) 11/29/2016  . B12 deficiency 10/26/2016  . Osteoporosis 10/26/2016  . Protein-calorie malnutrition, severe 10/06/2016  . Acute respiratory failure ([facility])   . Perforation of cecum due to diverticulitis   . SBO (small bowel obstruction) (H[facility])   . Diverticulitis of large intestine without perforation or abscess without bleeding   . Acute diverticulitis 09/19/2016  . Abdominal pain 09/19/2016  . Intractable nausea and vomiting 09/19/2016  . Rheumatoid arthritis (H[facility]) 09/19/2016  . 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 Acute pain of right knee 06/08/2016  . Primary osteoarthritis of right knee 06/08/2016  . Coronary artery calcification seen on CAT scan 04/30/2016  . Tachycardia 01/20/2015  . Encounter for long-term (current) use of other medications 03/14/2014  . Osteoarthritis 03/07/2014    Past Surgical History:  Procedure Laterality Date  . ABDOMINAL HYSTERECTOMY    . BACK  SURGERY  2005   slipped disc lower back, [place]  . CENTRAL VENOUS CATHETER INSERTION  09/21/2016   Procedure: INSERTION CENTRAL LINE ADULT;  Surgeon: Tiney Rouge III, MD;  Location: [facility];  Service: General;;  . COLON RESECTION  09/21/2016   Procedure: COLON RESECTION- Ascending and Sigmoid;  Surgeon: Tiney Rouge III, MD;  Location: [facility];  Service: General;;  . COLOSTOMY N/A 09/25/2016   Procedure: COLOSTOMY;  Surgeon: Tiney Rouge III, MD;  Location: [facility];  Service: General;  Laterality: N/A;  . DEBRIDEMENT OF ABDOMINAL WALL ABSCESS N/A 12/03/2016   Procedure: DEBRIDEMENT OF ABDOMINAL WALL ABSCESS;  Surgeon: Leafy Ro, MD;  Location: [facility];  Service: General;  Laterality: N/A;  . FOOT SURGERY Left   . ILEOSTOMY  08/24/2017   Procedure: ILEOSTOMY Loop Creation;  Surgeon: Ricarda Frame, MD;  Location: [facility] OR[facility];  Service: General;;  . ILEOSTOMY CLOSURE N/A 08/24/2017   Procedure: ILEOSTOMY REVERSAL X3;  Surgeon: Ricarda Frame, MD;  Location: [facility];  Service: General;  Laterality: N/A;  . ILEOSTOMY CLOSURE N/A 12/07/2017   Procedure: ILEOSTOMY TAKEDOWN;  Surgeon: Ricarda Frame, MD;  Location: [facility] OR[facility];  Service: General;  Laterality: N/A;  . JOINT REPLACEMENT Left 2012   knee  . LAPAROTOMY N/A 09/21/2016   Procedure: EXPLORATORY LAPAROTOMY;  Surgeon: Tiney Rouge III, MD;  Location: [facility];  Service: General;  Laterality: N/A;  . LAPAROTOMY N/A 09/25/2016   Procedure: EXPLORATORY LAPAROTOMY and right colon  resection;  Surgeon: Tiney Rouge III, MD;  Location: [facility];  Service: General;  Laterality: N/A;  . LAPAROTOMY  08/24/2017   Procedure: EXPLORATORY LAPAROTOMY;  Surgeon: Ricarda Frame, MD;  Location: [facility];  Service: General;;  . REPLACEMENT TOTAL KNEE Left   . SKIN GRAFT Left    lateral lower calf above ankle    OB History   No obstetric history on file.      Home Medications    Prior to Admission medications   Medication Sig Start Date End Date  Taking? Authorizing Provider  acetaminophen (TYLENOL) 500 MG tablet Take 1,000 mg by mouth every 8 (eight) hours as needed.    Yes [provider]  apixaban (ELIQUIS) 2.5 MG TABS tablet Take 1 tablet (2.5 mg total) by mouth 2 (two) times daily. 12/08/18  Yes Rickard Patience, MD  Calcium Carb-Cholecalciferol (CALCIUM 600+D3 PO) Take 2 tablets by mouth daily.   Yes [provider]  carbonyl iron (FEOSOL) 45 MG TABS tablet Take 45 mg by mouth every Monday, Wednesday, and Friday. In the morning.   Yes [provider]  ferrous sulfate 325 (65 FE) MG EC tablet Take 1 tablet (325 mg total) by mouth daily. 06/07/18  Yes Rickard Patience, MD  folic acid (FOLVITE) 1 MG tablet Take 2 tablets by mouth 1 day or 1 dose. 03/19/18  Yes [provider]  hydroxychloroquine (PLAQUENIL) 200 MG tablet TAKE 1 TABLET BY MOUTH EVERY DAY 12/08/18  Yes [provider]  methotrexate 50 MG/2ML injection Inject 0.8 mL subcutaneously once a week on Sundays. 03/24/17  Yes [provider]  metoprolol tartrate (LOPRESSOR) 25 MG tablet TAKE 1/2 TABLET (12.5 MG TOTAL) BY MOUTH 2 (TWO) TIMES DAILY. 11/29/16  Yes [provider]  doxycycline (VIBRA-TABS) 100 MG tablet Take 1 tablet (100 mg total) by mouth 2 (two) times daily. 04/23/19   Payton Mccallum, MD  oxyCODONE-acetaminophen (PERCOCET/ROXICET) 5-325 MG tablet Take 1-2 tablets by mouth every 4 (four) hours as needed for moderate pain. 12/10/17   [name], MD    Family History Family History  Problem Relation Age of Onset  . Breast cancer Other 36  . Prostate cancer Father   . Heart attack Father   . Ovarian cancer Sister   . Kidney cancer Brother   . Lung cancer Sister   . Kidney disease Mother     Social History Social History   Tobacco Use  . Smoking status: Former Smoker    Packs/day: 0.50    Years: 35.00    Pack years: 17.50    Types: Cigarettes    Last attempt to quit: 07/15/2016    Years since quitting: 2.7  .  Smokeless tobacco: Never Used  Substance Use Topics  . Alcohol use: No  . Drug use: No     Allergies   Ciprofloxacin; Tramadol; and Penicillins   Review of Systems Review of Systems   Physical Exam Triage Vital Signs ED Triage Vitals  Enc Vitals Group     BP 04/23/19 0847 (!) 149/99     Pulse Rate 04/23/19 0847 70     Resp 04/23/19 0847 18     Temp 04/23/19 0847 97.9 F (36.6 C)     Temp Source 04/23/19 0847 Oral     SpO2 04/23/19 0847 100 %     Weight 04/23/19 0851 189 lb (85.7 kg)     Height 04/23/19 0851 5\' 5"  (1.651 m)     Head Circumference --  Peak Flow --      Pain Score 04/23/19 0851 7     Pain Loc --      Pain Edu? --      Excl. in GC? --    No data found.  Updated Vital Signs BP (!) 149/99   Pulse 70   Temp 97.9 F (36.6 C) (Oral)   Resp 18   Ht 5\' 5"  (1.651 m)   Wt 85.7 kg   SpO2 100%   BMI 31.45 kg/m   Visual Acuity Right Eye Distance:   Left Eye Distance:   Bilateral Distance:    Right Eye Near:   Left Eye Near:    Bilateral Near:     Physical Exam Vitals signs and nursing note reviewed.  Constitutional:      General: She is not in acute distress.    Appearance: She is not toxic-appearing or diaphoretic.  Chest:       Comments: 3cm skin area with blanchable erythema, warmth and tenderness to palpation; slight purulent and bloody drainage on left lower breast skin Neurological:     Mental Status: She is alert.      UC Treatments / Results  Labs (all labs ordered are listed, but only abnormal results are displayed) Labs Reviewed - No data to display  EKG None  Radiology No results found.  Procedures Procedures (including critical care time)  Medications Ordered in UC Medications - No data to display  Initial Impression / Assessment and Plan / UC Course  I have reviewed the triage vital signs and the nursing notes.  Pertinent labs & imaging results that were available during my care of the patient were reviewed  by me and considered in my medical decision making (see chart for details).      Final Clinical Impressions(s) / UC Diagnoses   Final diagnoses:  Cellulitis of left breast     Discharge Instructions     Warm compresses to area    ED Prescriptions    Medication Sig Dispense Auth. Provider   doxycycline (VIBRA-TABS) 100 MG tablet Take 1 tablet (100 mg total) by mouth 2 (two) times daily. 20 tablet Payton Mccallumonty, Taurean Ju, MD      1. diagnosis reviewed with patient 2. rx as per orders above; reviewed possible side effects, interactions, risks and benefits  3. Recommend supportive treatment as above 4. Follow-up prn if symptoms worsen or don't improve Controlled Substance Prescriptions [place] Controlled Substance Registry consulted? Not Applicable   Payton Mccallumonty, Akiyah Eppolito, MD 04/23/19 [phone]

## 2019-04-23 NOTE — Discharge Instructions (Addendum)
Warm compresses to area °

## 2019-06-08 ENCOUNTER — Other Ambulatory Visit: Payer: Self-pay

## 2019-06-08 ENCOUNTER — Inpatient Hospital Stay: Payer: Medicare Other | Attending: Oncology

## 2019-06-08 ENCOUNTER — Inpatient Hospital Stay (HOSPITAL_BASED_OUTPATIENT_CLINIC_OR_DEPARTMENT_OTHER): Payer: Medicare Other | Admitting: Oncology

## 2019-06-08 ENCOUNTER — Encounter: Payer: Self-pay | Admitting: Oncology

## 2019-06-08 VITALS — BP 138/86 | HR 63 | Temp 98.3°F | Resp 18 | Wt 210.0 lb

## 2019-06-08 DIAGNOSIS — Z87891 Personal history of nicotine dependence: Secondary | ICD-10-CM | POA: Insufficient documentation

## 2019-06-08 DIAGNOSIS — D6489 Other specified anemias: Secondary | ICD-10-CM

## 2019-06-08 DIAGNOSIS — D6852 Prothrombin gene mutation: Secondary | ICD-10-CM | POA: Diagnosis not present

## 2019-06-08 DIAGNOSIS — Z79899 Other long term (current) drug therapy: Secondary | ICD-10-CM | POA: Diagnosis not present

## 2019-06-08 DIAGNOSIS — Z8042 Family history of malignant neoplasm of prostate: Secondary | ICD-10-CM

## 2019-06-08 DIAGNOSIS — D638 Anemia in other chronic diseases classified elsewhere: Secondary | ICD-10-CM

## 2019-06-08 DIAGNOSIS — Z803 Family history of malignant neoplasm of breast: Secondary | ICD-10-CM | POA: Insufficient documentation

## 2019-06-08 DIAGNOSIS — I825Y2 Chronic embolism and thrombosis of unspecified deep veins of left proximal lower extremity: Secondary | ICD-10-CM

## 2019-06-08 DIAGNOSIS — Z86718 Personal history of other venous thrombosis and embolism: Secondary | ICD-10-CM | POA: Insufficient documentation

## 2019-06-08 DIAGNOSIS — R635 Abnormal weight gain: Secondary | ICD-10-CM

## 2019-06-08 DIAGNOSIS — Z7901 Long term (current) use of anticoagulants: Secondary | ICD-10-CM | POA: Diagnosis not present

## 2019-06-08 LAB — CBC WITH DIFFERENTIAL/PLATELET
Abs Immature Granulocytes: 0.01 10*3/uL (ref 0.00–0.07)
Basophils Absolute: 0 10*3/uL (ref 0.0–0.1)
Basophils Relative: 0 %
Eosinophils Absolute: 0.3 10*3/uL (ref 0.0–0.5)
Eosinophils Relative: 6 %
HCT: 36.4 % (ref 36.0–46.0)
Hemoglobin: 11.8 g/dL — ABNORMAL LOW (ref 12.0–15.0)
Immature Granulocytes: 0 %
Lymphocytes Relative: 29 %
Lymphs Abs: 1.5 10*3/uL (ref 0.7–4.0)
MCH: 30 pg (ref 26.0–34.0)
MCHC: 32.4 g/dL (ref 30.0–36.0)
MCV: 92.6 fL (ref 80.0–100.0)
Monocytes Absolute: 0.5 10*3/uL (ref 0.1–1.0)
Monocytes Relative: 11 %
Neutro Abs: 2.7 10*3/uL (ref 1.7–7.7)
Neutrophils Relative %: 54 %
Platelets: 205 10*3/uL (ref 150–400)
RBC: 3.93 MIL/uL (ref 3.87–5.11)
RDW: 13 % (ref 11.5–15.5)
WBC: 5 10*3/uL (ref 4.0–10.5)
nRBC: 0 % (ref 0.0–0.2)

## 2019-06-08 LAB — COMPREHENSIVE METABOLIC PANEL
ALT: 13 U/L (ref 0–44)
AST: 20 U/L (ref 15–41)
Albumin: 3.7 g/dL (ref 3.5–5.0)
Alkaline Phosphatase: 95 U/L (ref 38–126)
Anion gap: 9 (ref 5–15)
BUN: 16 mg/dL (ref 8–23)
CO2: 25 mmol/L (ref 22–32)
Calcium: 8.9 mg/dL (ref 8.9–10.3)
Chloride: 104 mmol/L (ref 98–111)
Creatinine, Ser: 1.09 mg/dL — ABNORMAL HIGH (ref 0.44–1.00)
GFR calc Af Amer: 59 mL/min — ABNORMAL LOW (ref >60)
GFR calc non Af Amer: 51 mL/min — ABNORMAL LOW (ref >60)
Glucose, Bld: 86 mg/dL (ref 70–99)
Potassium: 4.8 mmol/L (ref 3.5–5.1)
Sodium: 138 mmol/L (ref 135–145)
Total Bilirubin: 0.5 mg/dL (ref 0.3–1.2)
Total Protein: 7.8 g/dL (ref 6.5–8.1)

## 2019-06-08 MED ORDER — APIXABAN 2.5 MG PO TABS: 2.5000 mg | ORAL_TABLET | Freq: Two times a day (BID) | ORAL | 5 refills | Status: DC

## 2019-06-08 NOTE — Progress Notes (Signed)
Hematology/Oncology Follow up note [facility] Telephone:([phone] Fax:(336) [phone]   Patient Care Team: Gracelyn Nurse, MD as PCP - General (Internal Medicine)  REFERRING PROVIDER: Gracelyn Nurse, MD CHIEF COMPLAINTS/PURPOSE OF CONSULTATION:  Evaluation of DVT  HISTORY OF PRESENTING ILLNESS:  Ruth Gray is a  73 y.o.  adult with PMH listed below who was referred to me for evaluation of DVT. Patient had first episode of provoked proximal DVT of left lower extremity (occlusive thrombus throughout the left lower extremity.) in Dec 2017, after a prolonged hospital and rehab stay after partial colon resection and creation of end ileostomy due to perforated diverticulosis.  She was started on Eliquis for anticoagulation for 3 months and came off anticoagulation.  She lives with her son in [place], and found to have left lower extremity swelling again. On 02/25/2017, patient has repeat US venous lower extremity and was found to have recurrent occlusive thrombosis of the left common femoral vein and proximal left femoral vein  She was restarted on Eliquis again.  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 There is nonocclusive thrombus throughout the popliteal vein. Patient was referred to me for decision of anticoagulation duration.  Patient also has RA following with Dr.Kernodle, on MTX and folic acid treatment.  Patient reports tolerating anticoagulation without any bleeding events.   #Hypercoagulable workup reviewed positive for single prothrombin gene mutation, heterozygote  # Repeat ultrasound showed stable appearance of chronic DVT. She has completed about 12 months of full dose anticoagulation with Eliquis, decision was made to decrease to Eliquis 2.5mg  BID for long term anticoagulation. (AMPLIFY extension trial).   INTERVAL HISTORY Ruth Gray is a 73 y.o. adult who has above history reviewed by me today presents for follow up visit for management of DVT and chronic anticoagulation.  Patient is on long-term anticoagulation with Eliquis 2.5 mg twice daily.  She reports tolerating well. Denies any bleeding events. Denies any shortness of breath, chest pain, weight loss fever or chills.   Appetite is very good.  She has gained 15 pounds since 6 months ago. She reports that left lower extremity skin has becoming darker, she noticed a change a few months ago.  Denies any pain today. She previously lives in [place] versus son.  Due to the COVID-19 pandemic, she has moved back to her [place] home.  .Review of Systems  Constitutional: Negative for chills, fever, malaise/fatigue and weight loss.  HENT: Negative for hearing loss, nosebleeds and sore throat.   Eyes: Negative for blurred vision, double vision, photophobia and redness.  Respiratory: Negative for cough, hemoptysis, sputum production, shortness of breath and wheezing.   Cardiovascular: Negative for chest pain, palpitations, orthopnea and leg swelling.  Gastrointestinal: Negative for abdominal pain, blood in stool, heartburn, nausea and vomiting.  Genitourinary: Negative for dysuria.  Musculoskeletal: Positive for joint pain. Negative for back pain, myalgias and neck pain.  Skin: Negative for itching and rash.       Left lower extremity skin has gotten darker.  Neurological: Negative for dizziness, tingling, tremors, sensory change and headaches.  Endo/Heme/Allergies: Negative for environmental allergies. Does not bruise/bleed easily.  Psychiatric/Behavioral: Negative for depression, hallucinations and substance abuse. The patient is not nervous/anxious.     MEDICAL HISTORY:  Past Medical History:  Diagnosis Date  . Anemia   . Collagen vascular disease (HCC)   .  DVT (deep venous thrombosis) (HCC) 10/2016   left leg  . Perforation of colon (HCC) 08/2016  .  Rheumatoid arteritis (HCC)     SURGICAL HISTORY: Past Surgical History:  Procedure Laterality Date  . ABDOMINAL HYSTERECTOMY    . BACK SURGERY  2005   slipped disc lower back, [place]  . CENTRAL VENOUS CATHETER INSERTION  09/21/2016   Procedure: INSERTION CENTRAL LINE ADULT;  Surgeon: Tiney Rouge III, MD;  Location: [facility] ORS;  Service: General;;  . COLON RESECTION  09/21/2016   Procedure: COLON RESECTION- Ascending and Sigmoid;  Surgeon: Tiney Rouge III, MD;  Location: [facility];  Service: General;;  . COLOSTOMY N/A 09/25/2016   Procedure: COLOSTOMY;  Surgeon: Tiney Rouge III, MD;  Location: [facility];  Service: General;  Laterality: N/A;  . DEBRIDEMENT OF ABDOMINAL WALL ABSCESS N/A 12/03/2016   Procedure: DEBRIDEMENT OF ABDOMINAL WALL ABSCESS;  Surgeon: Leafy Ro, MD;  Location: [facility];  Service: General;  Laterality: N/A;  . FOOT SURGERY Left   . ILEOSTOMY  08/24/2017   Procedure: ILEOSTOMY Loop Creation;  Surgeon: Ricarda Frame, MD;  Location: [facility];  Service: General;;  . ILEOSTOMY CLOSURE N/A 08/24/2017   Procedure: ILEOSTOMY REVERSAL X3;  Surgeon: Ricarda Frame, MD;  Location: [facility];  Service: General;  Laterality: N/A;  . ILEOSTOMY CLOSURE N/A 12/07/2017   Procedure: ILEOSTOMY TAKEDOWN;  Surgeon: Ricarda Frame, MD;  Location: [facility];  Service: General;  Laterality: N/A;  . JOINT REPLACEMENT Left 2012   knee  . LAPAROTOMY N/A 09/21/2016   Procedure: EXPLORATORY LAPAROTOMY;  Surgeon: Tiney Rouge III, MD;  Location: [facility];  Service: General;  Laterality: N/A;  . LAPAROTOMY N/A 09/25/2016   Procedure: EXPLORATORY LAPAROTOMY and right colon resection;  Surgeon: Tiney Rouge III, MD;  Location: [facility];  Service: General;  Laterality: N/A;  . LAPAROTOMY  08/24/2017   Procedure: EXPLORATORY LAPAROTOMY;  Surgeon: Ricarda Frame, MD;  Location: [facility];  Service: General;;  . REPLACEMENT TOTAL KNEE Left   . SKIN GRAFT Left    lateral lower calf above ankle    SOCIAL  HISTORY: Social History   Socioeconomic History  . Marital status: Widowed    Spouse name: Not on file  . Number of children: Not on file  . Years of education: Not on file  . Highest education level: Not on file  Occupational History  . Not on file  Social Needs  . Financial resource strain: Not on file  . Food insecurity    Worry: Not on file    Inability: Not on file  . Transportation needs    Medical: Not on file    Non-medical: Not on file  Tobacco Use  . Smoking status: Former Smoker    Packs/day: 0.50    Years: 35.00    Pack years: 17.50    Types: Cigarettes    Quit date: 07/15/2016    Years since quitting: 2.8  . Smokeless tobacco: Never Used  Substance and Sexual Activity  . Alcohol use: No  . Drug use: No  . Sexual activity: Not Currently  Lifestyle  . Physical activity    Days per week: Not on file    Minutes per session: Not on file  . Stress: Not on file  Relationships  . Social Musician on phone: Not on file    Gets together: Not on file    Attends religious service: Not on file    Active member of  club or organization: Not on file    Attends meetings of clubs or organizations: Not on file    Relationship status: Not on file  . Intimate partner violence    Fear of current or ex partner: Not on file    Emotionally abused: Not on file    Physically abused: Not on file    Forced sexual activity: Not on file  Other Topics Concern  . Not on file  Social History Narrative  . Not on file    FAMILY HISTORY: Family History  Problem Relation Age of Onset  . Breast cancer Other 36  . Prostate cancer Father   . Heart attack Father   . Ovarian cancer Sister   . Kidney cancer Brother   . Lung cancer Sister   . Kidney disease Mother     ALLERGIES:  is allergic to ciprofloxacin; tramadol; and penicillins.  MEDICATIONS:  Current Outpatient Medications  Medication Sig Dispense Refill  . acetaminophen (TYLENOL) 500 MG tablet Take 1,000 mg  by mouth every 8 (eight) hours as needed.     Marland Kitchen apixaban (ELIQUIS) 2.5 MG TABS tablet Take 1 tablet (2.5 mg total) by mouth 2 (two) times daily. 60 tablet 5  . Calcium Carb-Cholecalciferol (CALCIUM 600+D3 PO) Take 2 tablets by mouth daily.    . carbonyl iron (FEOSOL) 45 MG TABS tablet Take 45 mg by mouth every Monday, Wednesday, and Friday. In the morning.    Marland Kitchen doxycycline (VIBRA-TABS) 100 MG tablet Take 1 tablet (100 mg total) by mouth 2 (two) times daily. 20 tablet 0  . ferrous sulfate 325 (65 FE) MG EC tablet Take 1 tablet (325 mg total) by mouth daily. 60 tablet 3  . folic acid (FOLVITE) 1 MG tablet Take 2 tablets by mouth 1 day or 1 dose.  11  . hydroxychloroquine (PLAQUENIL) 200 MG tablet TAKE 1 TABLET BY MOUTH EVERY DAY    . methotrexate 50 MG/2ML injection Inject 0.8 mL subcutaneously once a week on Sundays.    . metoprolol tartrate (LOPRESSOR) 25 MG tablet TAKE 1/2 TABLET (12.5 MG TOTAL) BY MOUTH 2 (TWO) TIMES DAILY.  11   No current facility-administered medications for this visit.      PHYSICAL EXAMINATION: ECOG PERFORMANCE STATUS: 0 - Asymptomatic Vitals:   06/08/19 1027  BP: 138/86  Pulse: 63  Resp: 18  Temp: 98.3 F (36.8 C)  SpO2: 98%   Filed Weights   06/08/19 1027  Weight: 210 lb (95.3 kg)    Physical Exam  Constitutional: She is oriented to person, place, and time and well-developed, well-nourished, and in no distress. No distress.  HENT:  Head: Normocephalic and atraumatic.  Nose: Nose normal.  Mouth/Throat: Oropharynx is clear and moist. No oropharyngeal exudate.  Eyes: Pupils are equal, round, and reactive to light. Conjunctivae and EOM are normal. Left eye exhibits no discharge. No scleral icterus.  Neck: Normal range of motion. Neck supple. No JVD present.  Cardiovascular: Normal rate, regular rhythm and normal heart sounds. Exam reveals no friction rub.  No murmur heard. Pulmonary/Chest: Effort normal and breath sounds normal. No respiratory distress.  She has no rales. She exhibits no tenderness.  Abdominal: Soft. Bowel sounds are normal. She exhibits no distension and no mass. There is no abdominal tenderness.   Ventral  hernia  Musculoskeletal: Normal range of motion.        General: No edema.     Comments: Trace edema of left lower extremity  Lymphadenopathy:    She  has no cervical adenopathy.  Neurological: She is alert and oriented to person, place, and time. She displays normal reflexes. No cranial nerve deficit. She exhibits normal muscle tone. Coordination normal.  Skin: Skin is warm and dry. She is not diaphoretic. No erythema.  Left chain skin hyperpigmentation  Psychiatric: Affect normal.     LABORATORY DATA:  I have reviewed the data as listed Lab Results  Component Value Date   WBC 5.0 06/08/2019   HGB 11.8 (L) 06/08/2019   HCT 36.4 06/08/2019   MCV 92.6 06/08/2019   PLT 205 06/08/2019   Recent Labs    12/08/18 0942 06/08/19 1000  NA 140 138  K 4.1 4.8  CL 106 104  CO2 26 25  GLUCOSE 97 86  BUN 20 16  CREATININE 1.02* 1.09*  CALCIUM 9.2 8.9  GFRNONAA 55* 51*  GFRAA >60 59*  PROT 7.6 7.8  ALBUMIN 3.6 3.7  AST 21 20  ALT 14 13  ALKPHOS 90 95  BILITOT 0.4 0.5    RADIOGRAPHIC STUDIES: I have personally reviewed the radiological images as listed and agreed with the findings in the report. US venous lower extremity on 10/29/2016, 02/25/2017, 10/27/2017.   ASSESSMENT & PLAN:  1. Chronic deep vein thrombosis (DVT) of proximal vein of left lower extremity (HCC)   2. Prothrombin gene mutation (HCC)   3. Anemia of chronic disease   4. Weight gain    #Chronic anticoagulation for history of recurrent DVT:  Tolerates Eliquis 2.5 mg twice daily.  Continue anticoagulation. Prescription refill was sent to pharmacy.  #Heterozygous prothrombin gene mutation, chronic low-dose anticoagulation.  #Anermia of chronic disease, continue oral iron supplementation.  Hemoglobin has improved.  Continue to monitor.  #Weight gain, etiology unknown.  Discussed with patient about watching calorie intake and increase activity.  Advised patient to discuss with primary care physician.  The patient knows to call the clinic with any problems questions or concerns.  Return of visit: 6 months  Rickard PatienceZhou Barbarajean Kinzler, MD, PhD Hematology Oncology [facility] Pager- [phone] 06/08/2019

## 2019-06-08 NOTE — Progress Notes (Signed)
Pt in for follow up reports  Increased discoloration to left lower leg.  Pt also reports having very "dry itchy skin".

## 2019-08-20 ENCOUNTER — Other Ambulatory Visit: Payer: Self-pay | Admitting: Internal Medicine

## 2019-08-20 DIAGNOSIS — Z1231 Encounter for screening mammogram for malignant neoplasm of breast: Secondary | ICD-10-CM

## 2019-08-30 ENCOUNTER — Other Ambulatory Visit: Payer: Self-pay

## 2019-08-30 ENCOUNTER — Encounter (INDEPENDENT_AMBULATORY_CARE_PROVIDER_SITE_OTHER): Payer: Self-pay

## 2019-08-30 ENCOUNTER — Ambulatory Visit
Admission: RE | Admit: 2019-08-30 | Discharge: 2019-08-30 | Disposition: A | Discharge status: Home / Self Care | Payer: Medicare Other | Source: Ambulatory Visit | Attending: Internal Medicine | Admitting: Internal Medicine

## 2019-08-30 DIAGNOSIS — Z1231 Encounter for screening mammogram for malignant neoplasm of breast: Secondary | ICD-10-CM | POA: Diagnosis present

## 2019-09-24 ENCOUNTER — Telehealth: Payer: Self-pay | Admitting: *Deleted

## 2019-09-24 DIAGNOSIS — Z122 Encounter for screening for malignant neoplasm of respiratory organs: Secondary | ICD-10-CM

## 2019-09-24 DIAGNOSIS — Z87891 Personal history of nicotine dependence: Secondary | ICD-10-CM

## 2019-09-24 NOTE — Telephone Encounter (Signed)
Patient has been notified that annual lung cancer screening low dose CT scan is due currently or will be in near future. Confirmed that patient is within the age range of 55-77, and asymptomatic, (no signs or symptoms of lung cancer). Patient denies illness that would prevent curative treatment for lung cancer if found. Verified smoking history, (former, quit 2017, 31.25 pack year). The shared decision making visit was done 05/29/15. Patient is agreeable for CT scan being scheduled.

## 2019-09-27 ENCOUNTER — Other Ambulatory Visit: Payer: Self-pay

## 2019-09-27 ENCOUNTER — Ambulatory Visit
Admission: RE | Admit: 2019-09-27 | Discharge: 2019-09-27 | Disposition: A | Discharge status: Home / Self Care | Payer: Medicare Other | Source: Ambulatory Visit | Attending: Nurse Practitioner | Admitting: Nurse Practitioner

## 2019-09-27 DIAGNOSIS — Z87891 Personal history of nicotine dependence: Secondary | ICD-10-CM

## 2019-09-27 DIAGNOSIS — Z122 Encounter for screening for malignant neoplasm of respiratory organs: Secondary | ICD-10-CM

## 2019-10-01 ENCOUNTER — Telehealth: Payer: Self-pay | Admitting: *Deleted

## 2019-10-01 NOTE — Telephone Encounter (Signed)
Notified patient of LDCT lung cancer screening program results with recommendation for 12 month follow up imaging. Also notified of incidental findings noted below and is encouraged to discuss further with PCP who will receive a copy of this note and/or the CT report. Patient verbalizes understanding.   : 1. Lung-RADS 2, benign appearance or behavior. Continue annual screening with low-dose chest CT without contrast in 12 months. 2. Large ventral hernia containing liver, stomach, small bowel, and colon. 3. Aortic Atherosclerosis (ICD10-I70.0) and Emphysema (ICD10-J43.9).

## 2019-11-30 ENCOUNTER — Other Ambulatory Visit: Payer: Self-pay

## 2019-12-03 ENCOUNTER — Inpatient Hospital Stay: Payer: Medicare Other

## 2019-12-03 ENCOUNTER — Other Ambulatory Visit: Payer: Self-pay

## 2019-12-03 DIAGNOSIS — Z801 Family history of malignant neoplasm of trachea, bronchus and lung: Secondary | ICD-10-CM | POA: Diagnosis not present

## 2019-12-03 DIAGNOSIS — D6852 Prothrombin gene mutation: Secondary | ICD-10-CM | POA: Diagnosis not present

## 2019-12-03 DIAGNOSIS — Z87891 Personal history of nicotine dependence: Secondary | ICD-10-CM | POA: Insufficient documentation

## 2019-12-03 DIAGNOSIS — I825Y2 Chronic embolism and thrombosis of unspecified deep veins of left proximal lower extremity: Secondary | ICD-10-CM

## 2019-12-03 DIAGNOSIS — D638 Anemia in other chronic diseases classified elsewhere: Secondary | ICD-10-CM | POA: Insufficient documentation

## 2019-12-03 DIAGNOSIS — Z803 Family history of malignant neoplasm of breast: Secondary | ICD-10-CM | POA: Insufficient documentation

## 2019-12-03 DIAGNOSIS — Z8051 Family history of malignant neoplasm of kidney: Secondary | ICD-10-CM | POA: Diagnosis not present

## 2019-12-03 DIAGNOSIS — Z79899 Other long term (current) drug therapy: Secondary | ICD-10-CM | POA: Insufficient documentation

## 2019-12-03 DIAGNOSIS — Z8041 Family history of malignant neoplasm of ovary: Secondary | ICD-10-CM | POA: Diagnosis not present

## 2019-12-03 DIAGNOSIS — R635 Abnormal weight gain: Secondary | ICD-10-CM | POA: Diagnosis not present

## 2019-12-03 DIAGNOSIS — Z8249 Family history of ischemic heart disease and other diseases of the circulatory system: Secondary | ICD-10-CM | POA: Diagnosis not present

## 2019-12-03 DIAGNOSIS — Z7901 Long term (current) use of anticoagulants: Secondary | ICD-10-CM | POA: Insufficient documentation

## 2019-12-03 DIAGNOSIS — Z86718 Personal history of other venous thrombosis and embolism: Secondary | ICD-10-CM | POA: Diagnosis present

## 2019-12-03 DIAGNOSIS — Z90710 Acquired absence of both cervix and uterus: Secondary | ICD-10-CM | POA: Insufficient documentation

## 2019-12-03 LAB — CBC WITH DIFFERENTIAL/PLATELET
Abs Immature Granulocytes: 0.01 10*3/uL (ref 0.00–0.07)
Basophils Absolute: 0 10*3/uL (ref 0.0–0.1)
Basophils Relative: 1 %
Eosinophils Absolute: 0.4 10*3/uL (ref 0.0–0.5)
Eosinophils Relative: 7 %
HCT: 40.9 % (ref 36.0–46.0)
Hemoglobin: 12.6 g/dL (ref 12.0–15.0)
Immature Granulocytes: 0 %
Lymphocytes Relative: 27 %
Lymphs Abs: 1.5 10*3/uL (ref 0.7–4.0)
MCH: 30.4 pg (ref 26.0–34.0)
MCHC: 30.8 g/dL (ref 30.0–36.0)
MCV: 98.6 fL (ref 80.0–100.0)
Monocytes Absolute: 0.6 10*3/uL (ref 0.1–1.0)
Monocytes Relative: 11 %
Neutro Abs: 3.2 10*3/uL (ref 1.7–7.7)
Neutrophils Relative %: 54 %
Platelets: 207 10*3/uL (ref 150–400)
RBC: 4.15 MIL/uL (ref 3.87–5.11)
RDW: 12.2 % (ref 11.5–15.5)
WBC: 5.8 10*3/uL (ref 4.0–10.5)
nRBC: 0 % (ref 0.0–0.2)

## 2019-12-03 LAB — COMPREHENSIVE METABOLIC PANEL
ALT: 14 U/L (ref 0–44)
AST: 20 U/L (ref 15–41)
Albumin: 3.8 g/dL (ref 3.5–5.0)
Alkaline Phosphatase: 90 U/L (ref 38–126)
Anion gap: 10 (ref 5–15)
BUN: 17 mg/dL (ref 8–23)
CO2: 25 mmol/L (ref 22–32)
Calcium: 8.9 mg/dL (ref 8.9–10.3)
Chloride: 103 mmol/L (ref 98–111)
Creatinine, Ser: 1.12 mg/dL — ABNORMAL HIGH (ref 0.44–1.00)
GFR calc Af Amer: 57 mL/min — ABNORMAL LOW (ref >60)
GFR calc non Af Amer: 49 mL/min — ABNORMAL LOW (ref >60)
Glucose, Bld: 103 mg/dL — ABNORMAL HIGH (ref 70–99)
Potassium: 4.6 mmol/L (ref 3.5–5.1)
Sodium: 138 mmol/L (ref 135–145)
Total Bilirubin: 0.5 mg/dL (ref 0.3–1.2)
Total Protein: 7.9 g/dL (ref 6.5–8.1)

## 2019-12-04 ENCOUNTER — Inpatient Hospital Stay: Payer: Medicare Other | Attending: Oncology | Admitting: Oncology

## 2019-12-04 ENCOUNTER — Other Ambulatory Visit: Payer: Medicare Other

## 2019-12-04 ENCOUNTER — Encounter: Payer: Self-pay | Admitting: Oncology

## 2019-12-04 DIAGNOSIS — R635 Abnormal weight gain: Secondary | ICD-10-CM | POA: Diagnosis not present

## 2019-12-04 DIAGNOSIS — Z8249 Family history of ischemic heart disease and other diseases of the circulatory system: Secondary | ICD-10-CM

## 2019-12-04 DIAGNOSIS — Z7901 Long term (current) use of anticoagulants: Secondary | ICD-10-CM

## 2019-12-04 DIAGNOSIS — Z803 Family history of malignant neoplasm of breast: Secondary | ICD-10-CM

## 2019-12-04 DIAGNOSIS — I825Y2 Chronic embolism and thrombosis of unspecified deep veins of left proximal lower extremity: Secondary | ICD-10-CM

## 2019-12-04 DIAGNOSIS — Z79899 Other long term (current) drug therapy: Secondary | ICD-10-CM

## 2019-12-04 DIAGNOSIS — Z86718 Personal history of other venous thrombosis and embolism: Secondary | ICD-10-CM | POA: Diagnosis not present

## 2019-12-04 DIAGNOSIS — D638 Anemia in other chronic diseases classified elsewhere: Secondary | ICD-10-CM | POA: Diagnosis not present

## 2019-12-04 DIAGNOSIS — Z801 Family history of malignant neoplasm of trachea, bronchus and lung: Secondary | ICD-10-CM

## 2019-12-04 DIAGNOSIS — D6852 Prothrombin gene mutation: Secondary | ICD-10-CM

## 2019-12-04 DIAGNOSIS — Z8041 Family history of malignant neoplasm of ovary: Secondary | ICD-10-CM

## 2019-12-04 DIAGNOSIS — Z90710 Acquired absence of both cervix and uterus: Secondary | ICD-10-CM

## 2019-12-04 DIAGNOSIS — Z87891 Personal history of nicotine dependence: Secondary | ICD-10-CM

## 2019-12-04 MED ORDER — APIXABAN 2.5 MG PO TABS: 2.5000 mg | ORAL_TABLET | Freq: Two times a day (BID) | ORAL | 5 refills | Status: DC

## 2019-12-04 NOTE — Progress Notes (Signed)
HEMATOLOGY-ONCOLOGY TeleHEALTH VISIT PROGRESS NOTE  I connected with Ruth Gray on 12/04/19 at  8:30 AM EST by video enabled telemedicine visit and verified that I am speaking with the correct person using two identifiers. I discussed the limitations, risks, security and privacy concerns of performing an evaluation and management service by telemedicine and the availability of in-person appointments. I also discussed with the patient that there may be a patient responsible charge related to this service. The patient expressed understanding and agreed to proceed.   Other persons participating in the visit and their role in the encounter:  None  Patient's location: Home  Provider's location: office Chief Complaint: Follow-up for history of DVT, anticoagulation, anemia   INTERVAL HISTORY Ruth Gray is a 74 y.o. adult who has above history reviewed by me today presents for follow up visit for management of  history of DVT, anticoagulation, anemia Problems and complaints are listed below:  Patient reports doing well.  She takes Eliquis 2.5 mg twice daily.  No bleeding events. Continues to have weight gain.  Review of Systems  Constitutional: Positive for unexpected weight change. Negative for chills, diaphoresis, fatigue and fever.  HENT:   Negative for hearing loss, lump/mass, nosebleeds and sore throat.   Eyes: Negative for eye problems and icterus.  Respiratory: Negative for chest tightness, cough, hemoptysis, shortness of breath and wheezing.   Cardiovascular: Negative for chest pain and leg swelling.  Gastrointestinal: Negative for abdominal distention, abdominal pain, blood in stool, diarrhea, nausea and rectal pain.  Endocrine: Negative for hot flashes.  Genitourinary: Negative for bladder incontinence, difficulty urinating, dysuria, frequency, hematuria and nocturia.   Musculoskeletal: Negative for back pain, flank pain, gait problem and myalgias.  Skin: Negative for rash.   Neurological: Negative for dizziness, gait problem, headaches, numbness and seizures.  Hematological: Negative for adenopathy. Does not bruise/bleed easily.  Psychiatric/Behavioral: Negative for confusion and decreased concentration. The patient is not nervous/anxious.     Past Medical History:  Diagnosis Date  . Anemia   . Collagen vascular disease ([place])   . DVT (deep venous thrombosis) ([place]) 10/2016   left leg  . Perforation of colon ([place]) 08/2016  . Rheumatoid arteritis ([place])    Past Surgical History:  Procedure Laterality Date  . ABDOMINAL HYSTERECTOMY    . BACK SURGERY  2005   slipped disc lower back, [place]  . CENTRAL VENOUS CATHETER INSERTION  09/21/2016   Procedure: INSERTION CENTRAL LINE ADULT;  Surgeon: Dia Crawford III, MD;  Location: [facility];  Service: General;;  . COLON RESECTION  09/21/2016   Procedure: COLON RESECTION- Ascending and Sigmoid;  Surgeon: Dia Crawford III, MD;  Location: [facility];  Service: General;;  . COLOSTOMY N/A 09/25/2016   Procedure: COLOSTOMY;  Surgeon: Dia Crawford III, MD;  Location: [facility];  Service: General;  Laterality: N/A;  . DEBRIDEMENT OF ABDOMINAL WALL ABSCESS N/A 12/03/2016   Procedure: DEBRIDEMENT OF ABDOMINAL WALL ABSCESS;  Surgeon: Jules Husbands, MD;  Location: [facility];  Service: General;  Laterality: N/A;  . FOOT SURGERY Left   . ILEOSTOMY  08/24/2017   Procedure: ILEOSTOMY Loop Creation;  Surgeon: Clayburn Pert, MD;  Location: [facility];  Service: General;;  . ILEOSTOMY CLOSURE N/A 08/24/2017   Procedure: ILEOSTOMY REVERSAL X3;  Surgeon: Clayburn Pert, MD;  Location: [facility];  Service: General;  Laterality: N/A;  . ILEOSTOMY CLOSURE N/A 12/07/2017   Procedure: ILEOSTOMY TAKEDOWN;  Surgeon: Clayburn Pert, MD;  Location: [facility];  Service: General;  Laterality: N/A;  . JOINT REPLACEMENT Left 2012   knee  . LAPAROTOMY N/A 09/21/2016   Procedure: EXPLORATORY LAPAROTOMY;  Surgeon: Tiney Rouge III, MD;  Location: [facility];   Service: General;  Laterality: N/A;  . LAPAROTOMY N/A 09/25/2016   Procedure: EXPLORATORY LAPAROTOMY and right colon resection;  Surgeon: Tiney Rouge III, MD;  Location: [facility];  Service: General;  Laterality: N/A;  . LAPAROTOMY  08/24/2017   Procedure: EXPLORATORY LAPAROTOMY;  Surgeon: Ricarda Frame, MD;  Location: [facility];  Service: General;;  . REPLACEMENT TOTAL KNEE Left   . SKIN GRAFT Left    lateral lower calf above ankle    Family History  Problem Relation Age of Onset  . Breast cancer Other 36  . Prostate cancer Father   . Heart attack Father   . Ovarian cancer Sister   . Kidney cancer Brother   . Lung cancer Sister   . Kidney disease Mother     Social History   Socioeconomic History  . Marital status: Widowed    Spouse name: Not on file  . Number of children: Not on file  . Years of education: Not on file  . Highest education level: Not on file  Occupational History  . Not on file  Tobacco Use  . Smoking status: Former Smoker    Packs/day: 0.50    Years: 35.00    Pack years: 17.50    Types: Cigarettes    Quit date: 07/15/2016    Years since quitting: 3.3  . Smokeless tobacco: Never Used  Substance and Sexual Activity  . Alcohol use: No  . Drug use: No  . Sexual activity: Not Currently  Other Topics Concern  . Not on file  Social History Narrative  . Not on file   Social Determinants of Health   Financial Resource Strain:   . Difficulty of Paying Living Expenses: Not on file  Food Insecurity:   . Worried About Programme researcher, broadcasting/film/video in the Last Year: Not on file  . Ran Out of Food in the Last Year: Not on file  Transportation Needs:   . Lack of Transportation (Medical): Not on file  . Lack of Transportation (Non-Medical): Not on file  Physical Activity:   . Days of Exercise per Week: Not on file  . Minutes of Exercise per Session: Not on file  Stress:   . Feeling of Stress : Not on file  Social Connections:   . Frequency of Communication with  Friends and Family: Not on file  . Frequency of Social Gatherings with Friends and Family: Not on file  . Attends Religious Services: Not on file  . Active Member of Clubs or Organizations: Not on file  . Attends Banker Meetings: Not on file  . Marital Status: Not on file  Intimate Partner Violence:   . Fear of Current or Ex-Partner: Not on file  . Emotionally Abused: Not on file  . Physically Abused: Not on file  . Sexually Abused: Not on file    Current Outpatient Medications on File Prior to Visit  Medication Sig Dispense Refill  . acetaminophen (TYLENOL) 500 MG tablet Take 1,000 mg by mouth every 8 (eight) hours as needed.     . Calcium Carb-Cholecalciferol (CALCIUM 600+D3 PO) Take 2 tablets by mouth daily.    . carbonyl iron (FEOSOL) 45 MG TABS tablet Take 45 mg by mouth daily. In the morning.    Marland Kitchen doxycycline (VIBRA-TABS) 100 MG tablet Take 1  tablet (100 mg total) by mouth 2 (two) times daily. 20 tablet 0  . folic acid (FOLVITE) 1 MG tablet Take 2 tablets by mouth 1 day or 1 dose.  11  . golimumab (SIMPONI ARIA) 50 MG/4ML SOLN injection Inject into the vein every 8 (eight) weeks. Infusion given at [facility]    . hydroxychloroquine (PLAQUENIL) 200 MG tablet TAKE 1 TABLET BY MOUTH EVERY DAY    . metoprolol tartrate (LOPRESSOR) 25 MG tablet TAKE 1/2 TABLET (12.5 MG TOTAL) BY MOUTH 2 (TWO) TIMES DAILY.  11  . ferrous sulfate 325 (65 FE) MG EC tablet Take 1 tablet (325 mg total) by mouth daily. (Patient not taking: Reported on 12/04/2019) 60 tablet 3   No current facility-administered medications on file prior to visit.    Allergies  Allergen Reactions  . Ciprofloxacin Swelling and Other (See Comments)    Joint pain  . Tramadol Nausea And Vomiting    Had taken in past with no trouble but last prescription had nausea and vomiting  . Penicillins Swelling and Rash    Has patient had a PCN reaction causing immediate rash, facial/tongue/throat swelling, SOB or  lightheadedness with hypotension: yes Has patient had a PCN reaction causing severe rash involving mucus membranes or skin necrosis: no Has patient had a PCN reaction that required hospitalization no Has patient had a PCN reaction occurring within the last 10 years: no If all of the above answers are "NO", then may proceed with Cephalosporin use.        Observations/Objective: Today's Vitals   12/04/19 0835  PainSc: 0-No pain   There is no height or weight on file to calculate BMI.  Physical Exam  Constitutional: No distress.  Neurological: She is alert.  Psychiatric: Mood normal.    CBC    Component Value Date/Time   WBC 5.8 12/03/2019 1046   RBC 4.15 12/03/2019 1046   HGB 12.6 12/03/2019 1046   HCT 40.9 12/03/2019 1046   PLT 207 12/03/2019 1046   MCV 98.6 12/03/2019 1046   MCH 30.4 12/03/2019 1046   MCHC 30.8 12/03/2019 1046   RDW 12.2 12/03/2019 1046   LYMPHSABS 1.5 12/03/2019 1046   MONOABS 0.6 12/03/2019 1046   EOSABS 0.4 12/03/2019 1046   BASOSABS 0.0 12/03/2019 1046    CMP     Component Value Date/Time   NA 138 12/03/2019 1046   K 4.6 12/03/2019 1046   CL 103 12/03/2019 1046   CO2 25 12/03/2019 1046   GLUCOSE 103 (H) 12/03/2019 1046   BUN 17 12/03/2019 1046   CREATININE 1.12 (H) 12/03/2019 1046   CALCIUM 8.9 12/03/2019 1046   PROT 7.9 12/03/2019 1046   ALBUMIN 3.8 12/03/2019 1046   AST 20 12/03/2019 1046   ALT 14 12/03/2019 1046   ALKPHOS 90 12/03/2019 1046   BILITOT 0.5 12/03/2019 1046   GFRNONAA 49 (L) 12/03/2019 1046   GFRAA 57 (L) 12/03/2019 1046     Assessment and Plan: 1. Chronic deep vein thrombosis (DVT) of proximal vein of left lower extremity (HCC)   2. Prothrombin gene mutation (HCC)   3. Anemia of chronic disease   4. Weight gain     #Prothrombin gene mutation/chronic lower extremity DVT Patient has been on Eliquis 2.5 mg twice daily, tolerates well.  Recommend patient to continue. Labs are reviewed and discussed with  patient.  #Anemia, hemoglobin has improved to 12.6.  In the context of chronic kidney disease, I recommend patient continue to take oral iron  supplementation.  We will check iron panel at the next visit. She is off methotrexate as well which may attribute to the improved hemoglobin level.  #Weight gain, etiology unknown.  I recommend patient to discuss with primary care provider and check her thyroid function if it has not been checked recently.  She voices understanding and agrees with the plan..  Follow Up Instructions: 6 months.   I discussed the assessment and treatment plan with the patient. The patient was provided an opportunity to ask questions and all were answered. The patient agreed with the plan and demonstrated an understanding of the instructions.  The patient was advised to call back or seek an in-person evaluation if the symptoms worsen or if the condition fails to improve as anticipated.    Rickard Patience, MD 12/04/2019 1:19 PM

## 2019-12-04 NOTE — Progress Notes (Signed)
Patient verified using two identifiers for virtual visit via telephone today.  Patient does not offer any problems today.  

## 2019-12-04 NOTE — Addendum Note (Signed)
Addended by: Rickard Patience on: 12/04/2019 01:25 PM   Modules accepted: Orders

## 2020-01-17 ENCOUNTER — Other Ambulatory Visit
Admission: RE | Admit: 2020-01-17 | Discharge: 2020-01-17 | Disposition: A | Discharge status: Home / Self Care | Payer: Medicare Other | Source: Ambulatory Visit | Attending: Rheumatology | Admitting: Rheumatology

## 2020-01-17 ENCOUNTER — Inpatient Hospital Stay
Admission: RE | Admit: 2020-01-17 | Discharge: 2020-01-18 | DRG: 502 | Disposition: A | Discharge status: Home Health | Payer: Medicare Other | Source: Ambulatory Visit | Attending: Orthopedic Surgery | Admitting: Orthopedic Surgery

## 2020-01-17 ENCOUNTER — Observation Stay: Payer: Medicare Other | Admitting: Anesthesiology

## 2020-01-17 ENCOUNTER — Encounter: Payer: Self-pay | Admitting: Orthopedic Surgery

## 2020-01-17 ENCOUNTER — Encounter
Admission: RE | Disposition: A | Discharge status: Home / Self Care | Payer: Self-pay | Source: Ambulatory Visit | Attending: Orthopedic Surgery

## 2020-01-17 ENCOUNTER — Other Ambulatory Visit: Payer: Self-pay

## 2020-01-17 DIAGNOSIS — Z885 Allergy status to narcotic agent status: Secondary | ICD-10-CM

## 2020-01-17 DIAGNOSIS — I2510 Atherosclerotic heart disease of native coronary artery without angina pectoris: Secondary | ICD-10-CM | POA: Diagnosis present

## 2020-01-17 DIAGNOSIS — M65112 Other infective (teno)synovitis, left shoulder: Secondary | ICD-10-CM | POA: Diagnosis present

## 2020-01-17 DIAGNOSIS — M810 Age-related osteoporosis without current pathological fracture: Secondary | ICD-10-CM | POA: Diagnosis present

## 2020-01-17 DIAGNOSIS — M25512 Pain in left shoulder: Secondary | ICD-10-CM | POA: Diagnosis present

## 2020-01-17 DIAGNOSIS — Z8041 Family history of malignant neoplasm of ovary: Secondary | ICD-10-CM

## 2020-01-17 DIAGNOSIS — Z87891 Personal history of nicotine dependence: Secondary | ICD-10-CM

## 2020-01-17 DIAGNOSIS — Z881 Allergy status to other antibiotic agents status: Secondary | ICD-10-CM

## 2020-01-17 DIAGNOSIS — Z880 Allergy status to penicillin: Secondary | ICD-10-CM | POA: Diagnosis not present

## 2020-01-17 DIAGNOSIS — Z96652 Presence of left artificial knee joint: Secondary | ICD-10-CM | POA: Diagnosis present

## 2020-01-17 DIAGNOSIS — M00812 Arthritis due to other bacteria, left shoulder: Principal | ICD-10-CM | POA: Diagnosis present

## 2020-01-17 DIAGNOSIS — Z8042 Family history of malignant neoplasm of prostate: Secondary | ICD-10-CM

## 2020-01-17 DIAGNOSIS — Z801 Family history of malignant neoplasm of trachea, bronchus and lung: Secondary | ICD-10-CM

## 2020-01-17 DIAGNOSIS — Z8051 Family history of malignant neoplasm of kidney: Secondary | ICD-10-CM | POA: Diagnosis not present

## 2020-01-17 DIAGNOSIS — Z86718 Personal history of other venous thrombosis and embolism: Secondary | ICD-10-CM

## 2020-01-17 DIAGNOSIS — M069 Rheumatoid arthritis, unspecified: Secondary | ICD-10-CM | POA: Diagnosis present

## 2020-01-17 DIAGNOSIS — Z7901 Long term (current) use of anticoagulants: Secondary | ICD-10-CM

## 2020-01-17 DIAGNOSIS — Z978 Presence of other specified devices: Secondary | ICD-10-CM | POA: Diagnosis not present

## 2020-01-17 DIAGNOSIS — Z79899 Other long term (current) drug therapy: Secondary | ICD-10-CM

## 2020-01-17 DIAGNOSIS — M009 Pyogenic arthritis, unspecified: Secondary | ICD-10-CM | POA: Diagnosis not present

## 2020-01-17 DIAGNOSIS — Z8719 Personal history of other diseases of the digestive system: Secondary | ICD-10-CM | POA: Diagnosis not present

## 2020-01-17 DIAGNOSIS — M1711 Unilateral primary osteoarthritis, right knee: Secondary | ICD-10-CM | POA: Diagnosis present

## 2020-01-17 DIAGNOSIS — Z9049 Acquired absence of other specified parts of digestive tract: Secondary | ICD-10-CM

## 2020-01-17 DIAGNOSIS — Z9889 Other specified postprocedural states: Secondary | ICD-10-CM | POA: Diagnosis not present

## 2020-01-17 DIAGNOSIS — B9689 Other specified bacterial agents as the cause of diseases classified elsewhere: Secondary | ICD-10-CM | POA: Diagnosis present

## 2020-01-17 DIAGNOSIS — Z90710 Acquired absence of both cervix and uterus: Secondary | ICD-10-CM | POA: Diagnosis not present

## 2020-01-17 DIAGNOSIS — Z803 Family history of malignant neoplasm of breast: Secondary | ICD-10-CM

## 2020-01-17 DIAGNOSIS — Z841 Family history of disorders of kidney and ureter: Secondary | ICD-10-CM | POA: Diagnosis not present

## 2020-01-17 DIAGNOSIS — Z20822 Contact with and (suspected) exposure to covid-19: Secondary | ICD-10-CM | POA: Diagnosis present

## 2020-01-17 DIAGNOSIS — Z8249 Family history of ischemic heart disease and other diseases of the circulatory system: Secondary | ICD-10-CM | POA: Diagnosis not present

## 2020-01-17 HISTORY — PX: SHOULDER ARTHROSCOPY: SHX128

## 2020-01-17 LAB — SYNOVIAL CELL COUNT + DIFF, W/ CRYSTALS
Crystals, Fluid: NONE SEEN
Eosinophils-Synovial: 0 %
Lymphocytes-Synovial Fld: 2 %
Monocyte-Macrophage-Synovial Fluid: 0 %
Neutrophil, Synovial: 98 %
WBC, Synovial: 116359 /mm3 — ABNORMAL HIGH (ref 0–200)

## 2020-01-17 LAB — RESPIRATORY PANEL BY RT PCR (FLU A&B, COVID)
Influenza A by PCR: NEGATIVE
Influenza B by PCR: NEGATIVE
SARS Coronavirus 2 by RT PCR: NEGATIVE

## 2020-01-17 SURGERY — ARTHROSCOPY, SHOULDER
Anesthesia: General | Site: Shoulder | Laterality: Left

## 2020-01-17 MED ORDER — SODIUM CHLORIDE 0.9 % IV SOLN: INTRAVENOUS | Status: DC

## 2020-01-17 MED ORDER — SUGAMMADEX SODIUM 200 MG/2ML IV SOLN
INTRAVENOUS | Status: DC | PRN
  Administered 2020-01-17: 100 mg via INTRAVENOUS
  Administered 2020-01-17: 200 mg via INTRAVENOUS

## 2020-01-17 MED ORDER — ONDANSETRON HCL 4 MG/2ML IJ SOLN: 4.0000 mg | Freq: Once | INTRAMUSCULAR | Status: DC | PRN

## 2020-01-17 MED ORDER — SUCCINYLCHOLINE CHLORIDE 20 MG/ML IJ SOLN
INTRAMUSCULAR | Status: AC
  Filled 2020-01-17: qty 1

## 2020-01-17 MED ORDER — ONDANSETRON HCL 4 MG PO TABS: 4.0000 mg | ORAL_TABLET | Freq: Four times a day (QID) | ORAL | Status: DC | PRN

## 2020-01-17 MED ORDER — PHENOL 1.4 % MT LIQD
1.0000 | OROMUCOSAL | Status: DC | PRN
  Filled 2020-01-17: qty 177

## 2020-01-17 MED ORDER — ROCURONIUM BROMIDE 50 MG/5ML IV SOLN
INTRAVENOUS | Status: AC
  Filled 2020-01-17: qty 1

## 2020-01-17 MED ORDER — FENTANYL CITRATE (PF) 100 MCG/2ML IJ SOLN
INTRAMUSCULAR | Status: DC | PRN
  Administered 2020-01-17 (×4): 50 ug via INTRAVENOUS

## 2020-01-17 MED ORDER — CALCIUM CARBONATE-VITAMIN D 500-200 MG-UNIT PO TABS
ORAL_TABLET | Freq: Every day | ORAL | Status: DC
  Administered 2020-01-18: 1 via ORAL
  Filled 2020-01-17: qty 1

## 2020-01-17 MED ORDER — APIXABAN 2.5 MG PO TABS
2.5000 mg | ORAL_TABLET | Freq: Two times a day (BID) | ORAL | Status: DC
  Administered 2020-01-18: 2.5 mg via ORAL
  Filled 2020-01-17: qty 1

## 2020-01-17 MED ORDER — METOCLOPRAMIDE HCL 5 MG/ML IJ SOLN: 5.0000 mg | Freq: Three times a day (TID) | INTRAMUSCULAR | Status: DC | PRN

## 2020-01-17 MED ORDER — ONDANSETRON HCL 4 MG/2ML IJ SOLN
INTRAMUSCULAR | Status: DC | PRN
  Administered 2020-01-17: 4 mg via INTRAVENOUS

## 2020-01-17 MED ORDER — METOCLOPRAMIDE HCL 10 MG PO TABS: 5.0000 mg | ORAL_TABLET | Freq: Three times a day (TID) | ORAL | Status: DC | PRN

## 2020-01-17 MED ORDER — DOXYCYCLINE HYCLATE 100 MG PO TABS: 100.0000 mg | ORAL_TABLET | Freq: Two times a day (BID) | ORAL | Status: DC

## 2020-01-17 MED ORDER — PROPOFOL 500 MG/50ML IV EMUL
INTRAVENOUS | Status: AC
  Filled 2020-01-17: qty 50

## 2020-01-17 MED ORDER — FENTANYL CITRATE (PF) 100 MCG/2ML IJ SOLN
INTRAMUSCULAR | Status: AC
  Filled 2020-01-17: qty 2

## 2020-01-17 MED ORDER — BISACODYL 10 MG RE SUPP: 10.0000 mg | Freq: Every day | RECTAL | Status: DC | PRN

## 2020-01-17 MED ORDER — HYDROMORPHONE HCL 1 MG/ML IJ SOLN: 0.2000 mg | INTRAMUSCULAR | Status: DC | PRN

## 2020-01-17 MED ORDER — MENTHOL 3 MG MT LOZG
1.0000 | LOZENGE | OROMUCOSAL | Status: DC | PRN
  Filled 2020-01-17: qty 9

## 2020-01-17 MED ORDER — PROPOFOL 10 MG/ML IV BOLUS
INTRAVENOUS | Status: DC | PRN
  Administered 2020-01-17: 150 mg via INTRAVENOUS

## 2020-01-17 MED ORDER — DEXAMETHASONE SODIUM PHOSPHATE 10 MG/ML IJ SOLN
INTRAMUSCULAR | Status: DC | PRN
  Administered 2020-01-17: 10 mg via INTRAVENOUS

## 2020-01-17 MED ORDER — SENNOSIDES-DOCUSATE SODIUM 8.6-50 MG PO TABS: 1.0000 | ORAL_TABLET | Freq: Every evening | ORAL | Status: DC | PRN

## 2020-01-17 MED ORDER — ONDANSETRON HCL 4 MG/2ML IJ SOLN: 4.0000 mg | Freq: Four times a day (QID) | INTRAMUSCULAR | Status: DC | PRN

## 2020-01-17 MED ORDER — VANCOMYCIN HCL IN DEXTROSE 1-5 GM/200ML-% IV SOLN
1000.0000 mg | Freq: Once | INTRAVENOUS | Status: AC
  Administered 2020-01-17: 21:00:00 1000 mg via INTRAVENOUS
  Filled 2020-01-17: qty 200

## 2020-01-17 MED ORDER — FERROUS SULFATE 325 (65 FE) MG PO TABS
325.0000 mg | ORAL_TABLET | Freq: Every day | ORAL | Status: DC
  Administered 2020-01-18: 325 mg via ORAL
  Filled 2020-01-17: qty 1

## 2020-01-17 MED ORDER — ALUM & MAG HYDROXIDE-SIMETH 200-200-20 MG/5ML PO SUSP: 30.0000 mL | ORAL | Status: DC | PRN

## 2020-01-17 MED ORDER — ACETAMINOPHEN 10 MG/ML IV SOLN
INTRAVENOUS | Status: AC
  Filled 2020-01-17: qty 100

## 2020-01-17 MED ORDER — OXYCODONE HCL 5 MG PO TABS: 5.0000 mg | ORAL_TABLET | ORAL | Status: DC | PRN

## 2020-01-17 MED ORDER — OXYCODONE HCL 5 MG PO TABS: 10.0000 mg | ORAL_TABLET | ORAL | Status: DC | PRN

## 2020-01-17 MED ORDER — DEXMEDETOMIDINE HCL IN NACL 80 MCG/20ML IV SOLN
INTRAVENOUS | Status: AC
  Filled 2020-01-17: qty 20

## 2020-01-17 MED ORDER — VANCOMYCIN HCL IN DEXTROSE 1-5 GM/200ML-% IV SOLN
1000.0000 mg | Freq: Two times a day (BID) | INTRAVENOUS | Status: DC
  Filled 2020-01-17 (×2): qty 200

## 2020-01-17 MED ORDER — LACTATED RINGERS IR SOLN
Status: DC | PRN
  Administered 2020-01-17: 27000 mL

## 2020-01-17 MED ORDER — DOCUSATE SODIUM 100 MG PO CAPS
100.0000 mg | ORAL_CAPSULE | Freq: Two times a day (BID) | ORAL | Status: DC
  Administered 2020-01-18: 100 mg via ORAL
  Filled 2020-01-17: qty 1

## 2020-01-17 MED ORDER — SUCCINYLCHOLINE CHLORIDE 20 MG/ML IJ SOLN
INTRAMUSCULAR | Status: DC | PRN
  Administered 2020-01-17: 100 mg via INTRAVENOUS

## 2020-01-17 MED ORDER — LIDOCAINE HCL (CARDIAC) PF 100 MG/5ML IV SOSY
PREFILLED_SYRINGE | INTRAVENOUS | Status: DC | PRN
  Administered 2020-01-17: 100 mg via INTRAVENOUS

## 2020-01-17 MED ORDER — METOPROLOL TARTRATE 25 MG PO TABS
12.5000 mg | ORAL_TABLET | Freq: Two times a day (BID) | ORAL | Status: DC
  Administered 2020-01-18: 12.5 mg via ORAL
  Filled 2020-01-17: qty 1

## 2020-01-17 MED ORDER — DEXMEDETOMIDINE HCL 200 MCG/2ML IV SOLN
INTRAVENOUS | Status: DC | PRN
  Administered 2020-01-17: 16 ug via INTRAVENOUS

## 2020-01-17 MED ORDER — HYDROXYCHLOROQUINE SULFATE 200 MG PO TABS
200.0000 mg | ORAL_TABLET | Freq: Every day | ORAL | Status: DC
  Administered 2020-01-18: 200 mg via ORAL
  Filled 2020-01-17: qty 1

## 2020-01-17 MED ORDER — EPINEPHRINE PF 1 MG/ML IJ SOLN
INTRAMUSCULAR | Status: AC
  Filled 2020-01-17: qty 4

## 2020-01-17 MED ORDER — LACTATED RINGERS IV SOLN
INTRAVENOUS | Status: DC | PRN
  Administered 2020-01-17: 21:00:00 4 mL

## 2020-01-17 MED ORDER — LACTATED RINGERS IV SOLN: INTRAVENOUS | Status: DC | PRN

## 2020-01-17 MED ORDER — ACETAMINOPHEN 500 MG PO TABS
1000.0000 mg | ORAL_TABLET | Freq: Three times a day (TID) | ORAL | Status: DC
  Administered 2020-01-18 (×2): 1000 mg via ORAL
  Filled 2020-01-17 (×2): qty 2

## 2020-01-17 MED ORDER — DIPHENHYDRAMINE HCL 12.5 MG/5ML PO ELIX: 12.5000 mg | ORAL_SOLUTION | ORAL | Status: DC | PRN

## 2020-01-17 MED ORDER — ROCURONIUM BROMIDE 100 MG/10ML IV SOLN
INTRAVENOUS | Status: DC | PRN
  Administered 2020-01-17: 30 mg via INTRAVENOUS

## 2020-01-17 MED ORDER — FENTANYL CITRATE (PF) 100 MCG/2ML IJ SOLN: 25.0000 ug | INTRAMUSCULAR | Status: DC | PRN

## 2020-01-17 MED ORDER — SODIUM CHLORIDE 0.9 % IV SOLN
1.0000 g | Freq: Once | INTRAVENOUS | Status: AC
  Administered 2020-01-17: 21:00:00 1 g via INTRAVENOUS
  Filled 2020-01-17: qty 1

## 2020-01-17 MED ORDER — SODIUM CHLORIDE 0.9 % IV SOLN
1.0000 g | INTRAVENOUS | Status: DC
  Filled 2020-01-17 (×2): qty 10

## 2020-01-17 SURGICAL SUPPLY — 74 items
ADAPTER IRRIG TUBE 2 SPIKE SOL (ADAPTER) ×6 IMPLANT
ADPR TBG 2 SPK PMP STRL ASCP (ADAPTER) ×2
APL PRP STRL LF DISP 70% ISPRP (MISCELLANEOUS) ×1
BUR RADIUS 4.0X18.5 (BURR) ×3 IMPLANT
BUR RADIUS 5.5 (BURR) ×1 IMPLANT
CANNULA 5.75X7 CRYSTAL CLEAR (CANNULA) ×2 IMPLANT
CANNULA 5.75X7CM (CANNULA)
CANNULA PART THRD DISP 5.75X7 (CANNULA) ×2 IMPLANT
CANNULA PARTIAL THREAD 2X7 (CANNULA) ×3 IMPLANT
CANNULA TWIST IN 8.25X9CM (CANNULA) IMPLANT
CHLORAPREP W/TINT 26 (MISCELLANEOUS) ×3 IMPLANT
CLOSURE WOUND 1/2 X4 (GAUZE/BANDAGES/DRESSINGS)
COOLER POLAR GLACIER W/PUMP (MISCELLANEOUS) ×5 IMPLANT
DRAPE 3/4 80X56 (DRAPES) ×3 IMPLANT
DRAPE IMP U-DRAPE 54X76 (DRAPES) ×6 IMPLANT
DRAPE INCISE IOBAN 66X45 STRL (DRAPES) ×3 IMPLANT
DRAPE STERI 35X30 U-POUCH (DRAPES) ×2 IMPLANT
DRAPE U-SHAPE 47X51 STRL (DRAPES) ×3 IMPLANT
DRSG TEGADERM 4X4.75 (GAUZE/BANDAGES/DRESSINGS) ×4 IMPLANT
DRSG TEGADERM 6X8 (GAUZE/BANDAGES/DRESSINGS) IMPLANT
ELECT REM PT RETURN 9FT ADLT (ELECTROSURGICAL) ×3
ELECTRODE REM PT RTRN 9FT ADLT (ELECTROSURGICAL) IMPLANT
GAUZE 4X4 16PLY RFD (DISPOSABLE) ×1 IMPLANT
GAUZE SPONGE 4X4 12PLY STRL (GAUZE/BANDAGES/DRESSINGS) ×5 IMPLANT
GAUZE XEROFORM 1X8 LF (GAUZE/BANDAGES/DRESSINGS) ×1 IMPLANT
GLOVE BIO SURGEON STRL SZ7 (GLOVE) ×8 IMPLANT
GLOVE BIOGEL PI IND STRL 8 (GLOVE) ×1 IMPLANT
GLOVE BIOGEL PI INDICATOR 8 (GLOVE)
GLOVE INDICATOR 7.0 STRL GRN (GLOVE) ×4 IMPLANT
GLOVE PI ORTHOPRO 6.5 (GLOVE) ×2
GLOVE PI ORTHOPRO STRL 6.5 (GLOVE) IMPLANT
GLOVE SURG ORTHO 8.0 STRL STRW (GLOVE) ×10 IMPLANT
GLOVE SURG SYN 8.0 (GLOVE) IMPLANT
GLOVE SURG SYN 8.0 PF PI (GLOVE) ×1 IMPLANT
GOWN STRL REUS W/ TWL LRG LVL3 (GOWN DISPOSABLE) ×1 IMPLANT
GOWN STRL REUS W/TWL LRG LVL3 (GOWN DISPOSABLE) ×6
GOWN STRL REUS W/TWL XL LVL3 (GOWN DISPOSABLE) ×3 IMPLANT
HEMOVAC 400CC 10FR (MISCELLANEOUS) ×4 IMPLANT
IV LACTATED RINGER IRRG 3000ML (IV SOLUTION) ×39
IV LR IRRIG 3000ML ARTHROMATIC (IV SOLUTION) ×20 IMPLANT
KIT STABILIZATION SHOULDER (MISCELLANEOUS) ×3 IMPLANT
KIT SUTURETAK 3.0 INSERT PERC (KITS) IMPLANT
KIT TURNOVER KIT A (KITS) ×3 IMPLANT
MANIFOLD NEPTUNE II (INSTRUMENTS) ×3 IMPLANT
MASK FACE SPIDER DISP (MASK) ×3 IMPLANT
MAT ABSORB  FLUID 56X50 GRAY (MISCELLANEOUS) ×4
MAT ABSORB FLUID 56X50 GRAY (MISCELLANEOUS) ×2 IMPLANT
PACK ARTHROSCOPY SHOULDER (MISCELLANEOUS) ×3 IMPLANT
PAD ABD DERMACEA PRESS 5X9 (GAUZE/BANDAGES/DRESSINGS) ×1 IMPLANT
PAD WRAPON POLAR SHDR UNIV (MISCELLANEOUS) IMPLANT
PAD WRAPON POLAR SHDR XLG (MISCELLANEOUS) ×1 IMPLANT
SET TUBE SUCT SHAVER OUTFL 24K (TUBING) ×3 IMPLANT
SET TUBE TIP INTRA-ARTICULAR (MISCELLANEOUS) ×3 IMPLANT
SLING ARM M TX990204 (SOFTGOODS) ×2 IMPLANT
SLING ULTRA II M (MISCELLANEOUS) IMPLANT
SPONGE DRAIN TRACH 4X4 STRL 2S (GAUZE/BANDAGES/DRESSINGS) ×2 IMPLANT
STRIP CLOSURE SKIN 1/2X4 (GAUZE/BANDAGES/DRESSINGS) ×1 IMPLANT
SUT ETHILON 3-0 FS-10 30 BLK (SUTURE) ×3
SUT ETHILON 4-0 (SUTURE)
SUT ETHILON 4-0 FS2 18XMFL BLK (SUTURE)
SUT LASSO 90 DEG SD STR (SUTURE) IMPLANT
SUT MNCRL 4-0 (SUTURE)
SUT MNCRL 4-0 27XMFL (SUTURE)
SUTURE EHLN 3-0 FS-10 30 BLK (SUTURE) IMPLANT
SUTURE ETHLN 4-0 FS2 18XMF BLK (SUTURE) ×1 IMPLANT
SUTURE MNCRL 4-0 27XMF (SUTURE) IMPLANT
SYR 10ML LL (SYRINGE) ×3 IMPLANT
TAPE MICROFOAM 4IN (TAPE) ×1 IMPLANT
TUBING ARTHRO INFLOW-ONLY STRL (TUBING) ×3 IMPLANT
TUBING CONNECTING 10 (TUBING) ×2 IMPLANT
TUBING CONNECTING 10' (TUBING) ×1
WAND WEREWOLF FLOW 90D (MISCELLANEOUS) ×2 IMPLANT
WRAPON POLAR PAD SHDR UNIV (MISCELLANEOUS) ×3
WRAPON POLAR PAD SHDR XLG (MISCELLANEOUS) ×3

## 2020-01-17 NOTE — H&P (Signed)
HISTORY & PHYSICAL  Subjective:  Chief complaint:  L shoulder pain  The patient is a 74 y.o. female who reported severe onset left shoulder pain around 7 PM last night. Of note, she has a history of rheumatoid arthritis throughout her body and is on IV Remicade infusion.  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  She is able to perform daily activities with her left shoulder despite limited range of motion prior to the sudden onset of pain.  She also had some chills last night.  She took Tylenol overnight, but this did not significantly improve her pain.  Her chills did resolve by this morning.  She did not have a fever.  Today, she was evaluated as an outpatient by Dr. Lavenia Atlas in Rheumatology who performed a glenohumeral joint aspiration.  Fluid appeared turbid.  She was also evaluated in Orthopedics afterwards by Baldwin Jamaica, PA. Synovial fluid analysis was concerning for septic arthritis.  Of note, she does take Eliquis for history of DVT with last dose being yesterday evening.  She has been n.p.o. all day.  Patient Active Problem List   Diagnosis Date Noted  . Septic arthritis of shoulder, left (HCC) 01/17/2020  . H/O ileostomy 12/07/2017  . Ileostomy in place [facility]) 06/17/2017  . Surgical wound, non healing 03/15/2017  . Neutropenia (HCC) 02/25/2017  . Abscess of abdominal wall 12/02/2016  . Acute deep vein thrombosis (DVT) of distal vein of left lower extremity (HCC) 11/29/2016  . B12 deficiency 10/26/2016  . Osteoporosis 10/26/2016  . Protein-calorie malnutrition, severe 10/06/2016  . Acute respiratory failure (HCC)   . Perforation of cecum due to diverticulitis   . SBO (small bowel obstruction) (HCC)   . Diverticulitis of large intestine without perforation or abscess without bleeding   . Acute diverticulitis 09/19/2016  . Abdominal pain 09/19/2016  . Intractable nausea and vomiting 09/19/2016  . Rheumatoid arthritis (HCC) 09/19/2016  . 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 Acute pain of right knee 06/08/2016  . Primary osteoarthritis of right knee 06/08/2016  . Coronary artery calcification seen on CAT scan 04/30/2016  . Tachycardia 01/20/2015  . Encounter for long-term (current) use of other medications 03/14/2014  . Osteoarthritis 03/07/2014   Past Medical History:  Diagnosis Date  . Anemia   . Collagen vascular disease (HCC)   . DVT (deep venous thrombosis) (HCC) 10/2016   left leg  . Perforation of colon (HCC) 08/2016  . Rheumatoid arteritis (HCC)     Past Surgical History:  Procedure Laterality Date  . ABDOMINAL HYSTERECTOMY    . BACK SURGERY  2005   slipped disc lower back, [place]  . CENTRAL VENOUS CATHETER INSERTION  09/21/2016   Procedure: INSERTION CENTRAL LINE ADULT;  Surgeon: Tiney Rouge III, MD;  Location: [facility];  Service: General;;  . COLON RESECTION  09/21/2016   Procedure: COLON RESECTION- Ascending and Sigmoid;  Surgeon: Tiney Rouge III, MD;  Location: [facility];  Service: General;;  . COLOSTOMY N/A 09/25/2016   Procedure: COLOSTOMY;  Surgeon: Tiney Rouge III, MD;  Location: [facility];  Service: General;  Laterality: N/A;  . DEBRIDEMENT OF ABDOMINAL WALL ABSCESS N/A 12/03/2016   Procedure: DEBRIDEMENT OF ABDOMINAL WALL ABSCESS;  Surgeon: Leafy Ro, MD;  Location: [facility];  Service: General;  Laterality: N/A;  . FOOT SURGERY Left   . ILEOSTOMY  08/24/2017   Procedure: ILEOSTOMY Loop Creation;  Surgeon: Ricarda Frame, MD;  Location: [facility];  Service: General;;  .  ILEOSTOMY CLOSURE N/A 08/24/2017   Procedure: ILEOSTOMY REVERSAL X3;  Surgeon: Ricarda Frame, MD;  Location: [facility];  Service: General;  Laterality: N/A;  . ILEOSTOMY CLOSURE N/A 12/07/2017   Procedure: ILEOSTOMY TAKEDOWN;  Surgeon: Ricarda Frame, MD;  Location: [facility];  Service: General;  Laterality: N/A;  . JOINT REPLACEMENT Left 2012   knee  . LAPAROTOMY N/A 09/21/2016   Procedure: EXPLORATORY LAPAROTOMY;  Surgeon: Tiney Rouge  III, MD;  Location: [facility];  Service: General;  Laterality: N/A;  . LAPAROTOMY N/A 09/25/2016   Procedure: EXPLORATORY LAPAROTOMY and right colon resection;  Surgeon: Tiney Rouge III, MD;  Location: [facility];  Service: General;  Laterality: N/A;  . LAPAROTOMY  08/24/2017   Procedure: EXPLORATORY LAPAROTOMY;  Surgeon: Ricarda Frame, MD;  Location: [facility];  Service: General;;  . REPLACEMENT TOTAL KNEE Left   . SKIN GRAFT Left    lateral lower calf above ankle    Medications Prior to Admission  Medication Sig Dispense Refill Last Dose  . acetaminophen (TYLENOL) 500 MG tablet Take 1,000 mg by mouth every 8 (eight) hours as needed.      Marland Kitchen apixaban (ELIQUIS) 2.5 MG TABS tablet Take 1 tablet (2.5 mg total) by mouth 2 (two) times daily. 60 tablet 5   . Calcium Carb-Cholecalciferol (CALCIUM 600+D3 PO) Take 2 tablets by mouth daily.     . carbonyl iron (FEOSOL) 45 MG TABS tablet Take 45 mg by mouth daily. In the morning.     Marland Kitchen doxycycline (VIBRA-TABS) 100 MG tablet Take 1 tablet (100 mg total) by mouth 2 (two) times daily. 20 tablet 0   . ferrous sulfate 325 (65 FE) MG EC tablet Take 1 tablet (325 mg total) by mouth daily. (Patient not taking: Reported on 12/04/2019) 60 tablet 3   . folic acid (FOLVITE) 1 MG tablet Take 2 tablets by mouth 1 day or 1 dose.  11   . golimumab (SIMPONI ARIA) 50 MG/4ML SOLN injection Inject into the vein every 8 (eight) weeks. Infusion given at [facility]     . hydroxychloroquine (PLAQUENIL) 200 MG tablet TAKE 1 TABLET BY MOUTH EVERY DAY     . metoprolol tartrate (LOPRESSOR) 25 MG tablet TAKE 1/2 TABLET (12.5 MG TOTAL) BY MOUTH 2 (TWO) TIMES DAILY.  11    Allergies  Allergen Reactions  . Ciprofloxacin Swelling and Other (See Comments)    Joint pain  . Tramadol Nausea And Vomiting    Had taken in past with no trouble but last prescription had nausea and vomiting  . Penicillins Swelling and Rash    Has patient had a PCN reaction causing immediate rash,  facial/tongue/throat swelling, SOB or lightheadedness with hypotension: yes Has patient had a PCN reaction causing severe rash involving mucus membranes or skin necrosis: no Has patient had a PCN reaction that required hospitalization no Has patient had a PCN reaction occurring within the last 10 years: no If all of the above answers are "NO", then may proceed with Cephalosporin use.     Social History   Tobacco Use  . Smoking status: Former Smoker    Packs/day: 0.50    Years: 35.00    Pack years: 17.50    Types: Cigarettes    Quit date: 07/15/2016    Years since quitting: 3.5  . Smokeless tobacco: Never Used  Substance Use Topics  . Alcohol use: No    Family History  Problem Relation Age of Onset  . Breast cancer Other 36  .  Prostate cancer Father   . Heart attack Father   . Ovarian cancer Sister   . Kidney cancer Brother   . Lung cancer Sister   . Kidney disease Mother      Review of Systems: As noted above. The patient denies any chest pain, shortness of breath, nausea, vomiting, diarrhea, constipation, belly pain, blood in his/her stool, or burning with urination.  Objective: Pulse Rate:  [130] 130 (02/25 1855) Resp:  [17] 17 (02/25 1855) BP: (141)/(83) 141/83 (02/25 1855) SpO2:  [100 %] 100 % (02/25 1855)  Physical Exam: General:  Alert, no acute distress Psychiatric:  Patient is competent for consent with normal mood and affect Cardiovascular:  RRR  Respiratory:  Chest sounds clearr. No wheezing. Non-labored breathing GI:  Abdomen is soft and non-tender Skin:  No lesions in the area of chief complaint Neurologic:  Sensation intact distally Lymphatic:  No axillary or cervical lymphadenopathy  Orthopedic Exam:  LUE: +ain/pin/u motor SILT r/u/m/ax +rad pulse RoM-minimal active range of motion due to pain.  Passive range of motion to 90 degrees forward flexion neutral external rotation.  Abduction is approximately 30 degrees with very limited external rotation  and abduction. 5/5 external rotation strength bilaterally  Labs: Synovial fluid analysis: Component     Latest Ref Rng & Units 01/17/2020  Color, Synovial     YELLOW YELLOW (A)  Appearance-Synovial     CLEAR CLOUDY (A)  Crystals, Fluid      NO CRYSTALS SEEN  WBC, Synovial     0 - 200 /cu mm 116,359 (H)  Neutrophil, Synovial     % 98  Lymphocytes-Synovial Fld     % 2  Monocyte-Macrophage-Synovial Fluid     % 0  Eosinophils-Synovial     % 0   Imaging Review: Recent x-rays of L shoulder [facility] were reviewed  These films demonstrate significant degenerative changes to the glenohumeral joint. There was no fracture or dislocation noted. There was some inferior humeral head subluxation suggestive of large joint effusion.  Assessment: 74 yo F w/L shoulder septic arthritis.   Plan: The treatment options, including both surgical and nonsurgical choices, have been discussed in detail with the patient and his/her family. The risks (including bleeding, infection, nerve and/or blood vessel injury, persistent or recurrent pain, need for further surgery, blood clots, strokes, heart attacks or arrhythmias, pneumonia, etc.) and benefits of the surgical procedure were discussed. The patient states his/her understanding and agrees to proceed.   Patient signed formal written consent.  NPO until OR.  Plan for arthroscopic irrigation and debridement of left shoulder today urgently.   Leim Fabry  01/17/2020 6:32 PM

## 2020-01-17 NOTE — Transfer of Care (Signed)
Immediate Anesthesia Transfer of Care Note  Patient: Ruth Gray  Procedure(s) Performed: ARTHROSCOPY SHOULDER WASH OUT (Left Shoulder)  Patient Location: PACU  Anesthesia Type:General  Level of Consciousness: sedated  Airway & Oxygen Therapy: Patient Spontanous Breathing and Patient connected to face mask oxygen  Post-op Assessment: Report given to RN and Post -op Vital signs reviewed and stable  Post vital signs: Reviewed and stable  Last Vitals:  Vitals Value Taken Time  BP    Temp    Pulse 111 01/17/20 2253  Resp 12 01/17/20 2253  SpO2 95 % 01/17/20 2253  Vitals shown include unvalidated device data.  Last Pain:  Vitals:   01/17/20 1938  PainSc: 8          Complications: No apparent anesthesia complications

## 2020-01-17 NOTE — Anesthesia Preprocedure Evaluation (Addendum)
Anesthesia Evaluation  Patient identified by MRN, date of birth, ID band Patient awake    Reviewed: Allergy & Precautions, NPO status , Patient's Chart, lab work & pertinent test results  History of Anesthesia Complications Negative for: history of anesthetic complications  Airway Mallampati: II  TM Distance: >3 FB Neck ROM: Full    Dental  (+) Upper Dentures, Dental Advidsory Given, Edentulous Upper, Missing   Pulmonary neg sleep apnea, neg COPD, former smoker,    breath sounds clear to auscultation- rhonchi (-) wheezing      Cardiovascular Exercise Tolerance: Good (-) CAD, (-) Past MI, (-) Cardiac Stents and (-) CABG  Rhythm:Regular Rate:Normal - Systolic murmurs and - Diastolic murmurs    Neuro/Psych negative neurological ROS  negative psych ROS   GI/Hepatic negative GI ROS, Neg liver ROS,   Endo/Other  negative endocrine ROSneg diabetes  Renal/GU negative Renal ROS     Musculoskeletal  (+) Arthritis , Rheumatoid disorders,    Abdominal (+) - obese,   Peds  Hematology  (+) Blood dyscrasia, anemia ,   Anesthesia Other Findings Past Medical History: No date: Anemia No date: Collagen vascular disease (HCC) No date: DVT (deep venous thrombosis) (HCC)     Comment:  left leg 08/2016: Perforation of colon (HCC) 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  Obesity  Reproductive/Obstetrics                            Anesthesia Physical  Anesthesia Plan  ASA: II  Anesthesia Plan: General   Post-op Pain Management:    Induction: Intravenous  PONV Risk Score and Plan: 2 and Ondansetron and Dexamethasone  Airway Management Planned: Oral ETT  Additional Equipment:   Intra-op Plan:   Post-operative Plan: Extubation in OR  Informed Consent: I have reviewed the patients History and Physical, chart, labs and discussed the procedure including the risks, benefits and alternatives for the proposed  anesthesia with the patient or authorized representative who has indicated his/her understanding and acceptance.     Dental advisory given  Plan Discussed with: CRNA and Anesthesiologist  Anesthesia Plan Comments:         Anesthesia Quick Evaluation

## 2020-01-17 NOTE — Anesthesia Procedure Notes (Signed)
Procedure Name: Intubation Date/Time: 01/17/2020 8:26 PM Performed by: Almeta Monas, CRNA Pre-anesthesia Checklist: Patient identified, Patient being monitored, Timeout performed, Emergency Drugs available and Suction available Patient Re-evaluated:Patient Re-evaluated prior to induction Oxygen Delivery Method: Circle system utilized Preoxygenation: Pre-oxygenation with 100% oxygen Induction Type: IV induction Ventilation: Mask ventilation without difficulty Laryngoscope Size: 3 and McGraph Grade View: Grade I Tube type: Oral Tube size: 7.0 mm Number of attempts: 1 Airway Equipment and Method: Stylet,  Video-laryngoscopy and Rigid stylet Placement Confirmation: ETT inserted through vocal cords under direct vision,  positive ETCO2 and breath sounds checked- equal and bilateral Secured at: 21 cm Tube secured with: Tape Dental Injury: Teeth and Oropharynx as per pre-operative assessment

## 2020-01-17 NOTE — Op Note (Addendum)
Operative Note   SURGERY DATE: 01/17/2020  PRE-OP DIAGNOSIS:  1. Septic arthritis of left shoulder  POST-OP DIAGNOSIS: 1. Septic arthritis of left shoulder  PROCEDURES:  1. Arthroscopic Irrigation and debridement of L shoulder 2. Extensive glenohumeral debridement of L shoulder 3. Complete synovectomy of L shoulder  SURGEON: Cato Mulligan, MD  ASSISTANTS: Cassell Smiles, PA  ANESTHESIA: Gen   ESTIMATED BLOOD LOSS:5cc  TOTAL IV FLUIDS: per anesthesia  DRAIN: Hemovac x 2  INDICATION(S):  Ruth Gray is a 74 y.o. adult with acute onset of severe left shoulder pain starting yesterday evening.  Her clinical exam and glenohumeral joint aspirate findings were consistent with septic arthritis. After discussion of risks, benefits, and alternatives to surgery, the patient elected to proceed.   OPERATIVE FINDINGS: significant purulent material from the glenohumeral joint, severe degenerative changes of the humeral head and glenoid, intact rotator cuff, no purulent material in the subacromial space  Examination under anesthesia: A careful examination under anesthesia was performed.  Passive range of motion was: FF: 120; ER at side: 20; ER in abduction:70; IR in abduction:10.  Anterior load shift: NT.  Posterior load shift: NT.  Sulcus in neutral: NT.  Sulcus in ER: NT.    Intra-operative findings: A thorough arthroscopic examination of the shoulder was performed.  The findings are: 1. Biceps tendon: Not visualized intra-articularly 2. Superior labrum: Not present 3. Posterior labrum and capsule: Severe synovitis of capsule 4. Inferior capsule and inferior recess: Severe synovitis 5. Glenoid cartilage surface: Diffuse Grade 4 degenerative changes with erosive changes to the bone 6. Supraspinatus attachment: Normal 7. Posterior rotator cuff attachment: normal 8. Humeral head articular cartilage: Diffuse Grade 4 degenerative changes with erosive changes to the bone 9. Rotator  interval: Severe synovitis 10: Subscapularis tendon: attachment intact 11. Anterior labrum: Not present 12. IGHL: Synovitic   OPERATIVE REPORT:  I identified Ruth Piche Morrowin the pre-operative holding area. Informed consent was obtained and the surgical site was marked. I reviewed the risks and benefits of the proposed surgical intervention and the patient (and/or patient's guardian) wished to proceed. The patient was transferred to the operative suite and general anesthesia was administered. The patient was placed in the beach chair position with the head of the bed elevated approximately 80 degrees. All down side pressure points were appropriately padded. The extremity was then prepped and draped in standard fashion. A time out was performed confirming the correct extremity, correct patient, and correct procedure.   I then created a standard posterior portal with an 11 blade. The glenohumeral joint was easily entered with a blunt trocar. There was grossly purulent material that returned out of the trocar.  Culture samples x2 were obtained from this fluid. Empiric IV Antibiotics were administered. The arthroscope was introduced. The findings of diagnostic arthroscopy are described above. I debrided degenerative tissue including cartilaginous surfaces. I debrided and excised and then also coagulated the inflamed synovium using an Arthrocare radiofrequency device. Essentially, a complete synovectomy was performed to remove all infectious material.  A 70 degree arthroscope was used to assist with this.  Copious fluid was run through the joint.   The arthroscope was then placed in the subacromial space. An accessory lateral portal was established. A complete subacromial bursectomy and debridement of the gutters was carried out with a shaver.  ArthroCare was used to control bleeding.  There was no gross infectious material present in the subacromial space.  The rotator cuff on the bursal side was slightly  degenerative, but  there was no gross tear.    Next, I reentered the glenohumeral joint and drains were placed through both the anterior and posterior portals and connected to a Hemovac.  We closed the portals with 3-0 Nylon sutures. Wounds were covered with xeroform and a sterile dressing. PolarCare and sling were applied. The patient was awakened from anesthesia without complication and taken to the PACU for further recovery.  Of note, assistance from a PA was essential to performing the surgery.  PA was present for the entire surgery.  PA assisted with patient positioning, instrumentation, and wound closure. The surgery would have been more difficult and had longer operative time without PA assistance.    POSTOPERATIVE PLAN: The patient will return to the floor. Continue broad-spectrum IV Abx. ID Consult. F/U OR Culture results. Can resume home Eliquis on POD#1. Maintain drains until dry.

## 2020-01-18 ENCOUNTER — Inpatient Hospital Stay: Payer: Self-pay

## 2020-01-18 DIAGNOSIS — M069 Rheumatoid arthritis, unspecified: Secondary | ICD-10-CM

## 2020-01-18 DIAGNOSIS — Z87891 Personal history of nicotine dependence: Secondary | ICD-10-CM

## 2020-01-18 DIAGNOSIS — Z880 Allergy status to penicillin: Secondary | ICD-10-CM

## 2020-01-18 DIAGNOSIS — Z978 Presence of other specified devices: Secondary | ICD-10-CM

## 2020-01-18 DIAGNOSIS — Z885 Allergy status to narcotic agent status: Secondary | ICD-10-CM

## 2020-01-18 DIAGNOSIS — Z90710 Acquired absence of both cervix and uterus: Secondary | ICD-10-CM

## 2020-01-18 DIAGNOSIS — Z9889 Other specified postprocedural states: Secondary | ICD-10-CM

## 2020-01-18 DIAGNOSIS — Z96652 Presence of left artificial knee joint: Secondary | ICD-10-CM

## 2020-01-18 DIAGNOSIS — M009 Pyogenic arthritis, unspecified: Secondary | ICD-10-CM

## 2020-01-18 DIAGNOSIS — Z881 Allergy status to other antibiotic agents status: Secondary | ICD-10-CM

## 2020-01-18 LAB — CBC
HCT: 40.5 % (ref 36.0–46.0)
Hemoglobin: 13 g/dL (ref 12.0–15.0)
MCH: 30.1 pg (ref 26.0–34.0)
MCHC: 32.1 g/dL (ref 30.0–36.0)
MCV: 93.8 fL (ref 80.0–100.0)
Platelets: 199 10*3/uL (ref 150–400)
RBC: 4.32 MIL/uL (ref 3.87–5.11)
RDW: 11.9 % (ref 11.5–15.5)
WBC: 6.4 10*3/uL (ref 4.0–10.5)
nRBC: 0 % (ref 0.0–0.2)

## 2020-01-18 LAB — SEDIMENTATION RATE: Sed Rate: 63 mm/hr — ABNORMAL HIGH (ref 0–30)

## 2020-01-18 LAB — BASIC METABOLIC PANEL
Anion gap: 8 (ref 5–15)
BUN: 16 mg/dL (ref 8–23)
CO2: 23 mmol/L (ref 22–32)
Calcium: 8.6 mg/dL — ABNORMAL LOW (ref 8.9–10.3)
Chloride: 105 mmol/L (ref 98–111)
Creatinine, Ser: 1.15 mg/dL — ABNORMAL HIGH (ref 0.44–1.00)
GFR calc Af Amer: 55 mL/min — ABNORMAL LOW (ref >60)
GFR calc non Af Amer: 47 mL/min — ABNORMAL LOW (ref >60)
Glucose, Bld: 157 mg/dL — ABNORMAL HIGH (ref 70–99)
Potassium: 4.7 mmol/L (ref 3.5–5.1)
Sodium: 136 mmol/L (ref 135–145)

## 2020-01-18 LAB — C-REACTIVE PROTEIN: CRP: 19.5 mg/dL — ABNORMAL HIGH (ref <1.0)

## 2020-01-18 MED ORDER — VANCOMYCIN IV (FOR PTA / DISCHARGE USE ONLY): 1000.0000 mg | INTRAVENOUS | 0 refills | Status: AC

## 2020-01-18 MED ORDER — OXYCODONE HCL 5 MG PO TABS: 5.0000 mg | ORAL_TABLET | ORAL | 0 refills | Status: DC | PRN

## 2020-01-18 MED ORDER — VANCOMYCIN HCL IN DEXTROSE 1-5 GM/200ML-% IV SOLN
1000.0000 mg | INTRAVENOUS | Status: DC
  Administered 2020-01-18: 1000 mg via INTRAVENOUS
  Filled 2020-01-18 (×2): qty 200

## 2020-01-18 MED ORDER — CHLORHEXIDINE GLUCONATE CLOTH 2 % EX PADS
6.0000 | MEDICATED_PAD | Freq: Every day | CUTANEOUS | Status: DC
  Administered 2020-01-18: 6 via TOPICAL

## 2020-01-18 MED ORDER — ONDANSETRON HCL 4 MG PO TABS: 4.0000 mg | ORAL_TABLET | Freq: Four times a day (QID) | ORAL | 0 refills | Status: DC | PRN

## 2020-01-18 MED ORDER — SODIUM CHLORIDE 0.9 % IV SOLN
1.0000 g | INTRAVENOUS | Status: DC
  Administered 2020-01-18: 1 g via INTRAVENOUS
  Filled 2020-01-18: qty 10
  Filled 2020-01-18: qty 1

## 2020-01-18 MED ORDER — VANCOMYCIN HCL IN DEXTROSE 1-5 GM/200ML-% IV SOLN
1000.0000 mg | Freq: Two times a day (BID) | INTRAVENOUS | Status: DC
  Filled 2020-01-18 (×2): qty 200

## 2020-01-18 MED ORDER — SODIUM CHLORIDE 0.9% FLUSH: 10.0000 mL | Freq: Two times a day (BID) | INTRAVENOUS | Status: DC

## 2020-01-18 MED ORDER — SODIUM CHLORIDE 0.9% FLUSH: 10.0000 mL | INTRAVENOUS | Status: DC | PRN

## 2020-01-18 MED ORDER — CEFTRIAXONE IV (FOR PTA / DISCHARGE USE ONLY): 2.0000 g | INTRAVENOUS | 0 refills | Status: AC

## 2020-01-18 NOTE — Discharge Instructions (Signed)
INSTRUCTIONS AFTER Surgery  o Remove items at home which could result in a fall. This includes throw rugs or furniture in walking pathways o ICE to the affected joint every three hours while awake for 30 minutes at a time, for at least the first 3-5 days, and then as needed for pain and swelling.  Continue to use ice for pain and swelling. You may notice swelling that will progress down to the foot and ankle.  This is normal after surgery.  Elevate your leg when you are not up walking on it.   o Continue to use the breathing machine you got in the hospital (incentive spirometer) which will help keep your temperature down.  It is common for your temperature to cycle up and down following surgery, especially at night when you are not up moving around and exerting yourself.  The breathing machine keeps your lungs expanded and your temperature down.   DIET:  As you were doing prior to hospitalization, we recommend a well-balanced diet.  DRESSING / WOUND CARE / SHOWERING  The dressing will remain, as well as the drains, until follow up on Monday at [facility] in [place].  Leave the drains alone for now.  Try to keep the area dry and clean.  No showering.  ACTIVITY  o Normal activities as tolerated.  Limit use of the left arm with lifting or holding objects.    WEIGHT BEARING   Weight bearing as tolerated with assist device (walker, cane, etc) as directed, use it as long as suggested by your surgeon or therapist, typically at least 4-6 weeks.   EXERCISES  Gently move the shoulder as well as the wrist and hand to work on gentle range of motion.  Use caution with putting pressure on the left upper extremity.   CONSTIPATION  Constipation is defined medically as fewer than three stools per week and severe constipation as less than one stool per week.  Even if you have a regular bowel pattern at home, your normal regimen is likely to be disrupted due to multiple reasons following surgery.   Combination of anesthesia, postoperative narcotics, change in appetite and fluid intake all can affect your bowels.   YOU MUST use at least one of the following options; they are listed in order of increasing strength to get the job done.  They are all available over the counter, and you may need to use some, POSSIBLY even all of these options:    Drink plenty of fluids (prune juice may be helpful) and high fiber foods Colace 100 mg by mouth twice a day  Senokot for constipation as directed and as needed Dulcolax (bisacodyl), take with full glass of water  Miralax (polyethylene glycol) once or twice a day as needed.  If you have tried all these things and are unable to have a bowel movement in the first 3-4 days after surgery call either your surgeon or your primary doctor.    If you experience loose stools or diarrhea, hold the medications until you stool forms back up.  If your symptoms do not get better within 1 week or if they get worse, check with your doctor.  If you experience "the worst abdominal pain ever" or develop nausea or vomiting, please contact the office immediately for further recommendations for treatment.   ITCHING:  If you experience itching with your medications, try taking only a single pain pill, or even half a pain pill at a time.  You can also use  Benadryl over the counter for itching or also to help with sleep.    MEDICATIONS:  See your medication summary on the After Visit Summary that nursing will review with you.  You may have some home medications which will be placed on hold until you complete the course of blood thinner medication.  It is important for you to complete the blood thinner medication as prescribed.  PRECAUTIONS:  If you experience chest pain or shortness of breath - call 911 immediately for transfer to the hospital emergency department.   If you develop a fever greater that 101 F, purulent drainage from wound, increased redness or drainage from  wound, foul odor from the wound/dressing, or calf pain - CONTACT YOUR SURGEON.                                                   FOLLOW-UP APPOINTMENTS:  If you do not already have a post-op appointment, please call the office for an appointment to be seen by your surgeon.  Guidelines for how soon to be seen are listed in your After Visit Summary, but are typically between 1-4 weeks after surgery.  OTHER INSTRUCTIONS:   Stay in the sling for protection.  Use the Polar Care or ice pack to help with swelling. Continue the antibiotics as prescribed.  MAKE SURE YOU:   Understand these instructions.   Get help right away if you are not doing well or get worse.    Thank you for letting us be a part of your medical care team.  It is a privilege we respect greatly.  We hope these instructions will help you stay on track for a fast and full recovery!

## 2020-01-18 NOTE — Progress Notes (Signed)
PHARMACY CONSULT NOTE FOR:  OUTPATIENT  PARENTERAL ANTIBIOTIC THERAPY (OPAT)  Indication: Septic Arthritis  Regimen:  Ceftriaxone 2g IV every 24 hours                   Vancomycin 1g IV every 24 hours  End date: 01/24/2020  IV antibiotic discharge orders are pended. To discharging provider:  please sign these orders via discharge navigator,  Select New Orders & click on the button choice - Manage This Unsigned Work.     Thank you for allowing pharmacy to be a part of this patient's care.  Ruth Gray 01/18/2020, 1:22 PM Gardner Candle, PharmD, BCPS Clinical Pharmacist 01/18/2020 1:23 PM

## 2020-01-18 NOTE — Treatment Plan (Addendum)
Diagnosis: Left shoulder septic arthritis Baseline Creatinine 1.15 Culture Result: pending  Allergies  Allergen Reactions  . Ciprofloxacin Swelling and Other (See Comments)    Joint pain  . Tramadol Nausea And Vomiting    Had taken in past with no trouble but last prescription had nausea and vomiting  . Penicillins Swelling and Rash    Has patient had a PCN reaction causing immediate rash, facial/tongue/throat swelling, SOB or lightheadedness with hypotension: yes Has patient had a PCN reaction causing severe rash involving mucus membranes or skin necrosis: no Has patient had a PCN reaction that required hospitalization no Has patient had a PCN reaction occurring within the last 10 years: no If all of the above answers are "NO", then may proceed with Cephalosporin use.     OPAT Orders Discharge antibiotics: Vancomycin 1 gram IV every 24 hours Ceftriaxone 2 grams IV every 24 hours  Please give for 5 days and once the culture is back we can adjust the antibiotics next week Aim for Vancomycin trough 15-  She will need antibiotics IV 2-4 weeks depending on the bacteria cultured  End date not known currently  [facility] Care Per Protocol:  Labs weekly Monday while on IV antibiotics: _X_ CBC with differential  _X_ CMP  _X_ ESR _X_ Vancomycin trough  Labs on Thursday if the patient is still on IV vancomycin  BMP VAnco level  _X__ Please leave PIC in place and notify Doctor  Fax weekly labs to  Dr.Fitzgerald  [phone]   Clinic Follow Up Appt: with Dr.David fitzgerald next week Mond/Tues  Call [phone]  to make appt

## 2020-01-18 NOTE — Progress Notes (Signed)
Peripherally Inserted Central Catheter/Midline Placement  The IV Nurse has discussed with the patient and/or persons authorized to consent for the patient, the purpose of this procedure and the potential benefits and risks involved with this procedure.  The benefits include less needle sticks, lab draws from the catheter, and the patient may be discharged home with the catheter. Risks include, but not limited to, infection, bleeding, blood clot (thrombus formation), and puncture of an artery; nerve damage and irregular heartbeat and possibility to perform a PICC exchange if needed/ordered by physician.  Alternatives to this procedure were also discussed.  Bard Power PICC patient education guide, fact sheet on infection prevention and patient information card has been provided to patient /or left at bedside.    PICC/Midline Placement Documentation  PICC Single Lumen 01/18/20 PICC Right Cephalic 36 cm 0 cm (Active)  Indication for Insertion or Continuance of Line Home intravenous therapies (PICC only) 01/18/20 1400  Exposed Catheter (cm) 0 cm 01/18/20 1400  Site Assessment Clean;Dry;Intact 01/18/20 1400  Line Status Flushed;Saline locked;Blood return noted 01/18/20 1400  Dressing Type Transparent;Securing device 01/18/20 1400  Dressing Status Clean;Dry;Intact;Antimicrobial disc in place 01/18/20 1400  Line Care Connections checked and tightened 01/18/20 1400  Dressing Intervention New dressing;Other (Comment) 01/18/20 1400  Dressing Change Due 01/25/20 01/18/20 1400       Tonna Boehringer 01/18/2020, 2:11 PM

## 2020-01-18 NOTE — Progress Notes (Signed)
  Subjective: 1 Day Post-Op Procedure(s) (LRB): ARTHROSCOPY SHOULDER WASH OUT (Left) Patient reports pain as mild.   Patient is well, and has had no acute complaints or problems.  The patient is much improved from yesterday.  She is able to get comfortable now. Plan is to go Home after hospital stay. Negative for chest pain and shortness of breath Fever: no Gastrointestinal: Negative for nausea and vomiting  Objective: Vital signs in last 24 hours: Temp:  [97.1 F (36.2 C)-98.1 F (36.7 C)] 97.7 F (36.5 C) (02/26 0807) Pulse Rate:  [92-130] 92 (02/26 0807) Resp:  [10-17] 17 (02/26 0807) BP: (107-143)/(63-91) 107/72 (02/26 0807) SpO2:  [93 %-100 %] 100 % (02/26 0807) Weight:  [99.8 kg] 99.8 kg (02/25 2350)  Intake/Output from previous day:  Intake/Output Summary (Last 24 hours) at 01/18/2020 0951 Last data filed at 01/18/2020 0940 Gross per 24 hour  Intake 1744.27 ml  Output 435 ml  Net 1309.27 ml    Intake/Output this shift: Total I/O In: -  Out: 400 [Urine:400]  Labs: Recent Labs    01/18/20 0453  HGB 13.0   Recent Labs    01/18/20 0453  WBC 6.4  RBC 4.32  HCT 40.5  PLT 199   Recent Labs    01/18/20 0453  NA 136  K 4.7  CL 105  CO2 23  BUN 16  CREATININE 1.15*  GLUCOSE 157*  CALCIUM 8.6*   No results for input(s): LABPT, INR in the last 72 hours.   EXAM General - Patient is Alert and Oriented Extremity - Neurovascular intact Sensation intact distally Sling is well-positioned.  Polar Care is working well.  Patient is able to move her shoulder more comfortably. Dressing/Incision - clean, dry, with 30 cc from the Hemovac over the last 12 hours. Motor Function - intact, moving wrist and fingers well on exam.   Past Medical History:  Diagnosis Date  . Anemia   . Collagen vascular disease (HCC)   . DVT (deep venous thrombosis) (HCC) 10/2016   left leg  . Perforation of colon (HCC) 08/2016  . Rheumatoid arteritis (HCC)      Assessment/Plan: 1 Day Post-Op Procedure(s) (LRB): ARTHROSCOPY SHOULDER WASH OUT (Left) Active Problems:   Septic arthritis of shoulder, left (HCC)  Estimated body mass index is 34.46 kg/m as calculated from the following:   Height as of this encounter: 5\' 7"  (1.702 m).   Weight as of this encounter: 99.8 kg. Advance diet Up with therapy  Appreciate infectious disease assistance.  Continue IV antibiotics. Care management assistance with discharge planning. Continue drains until drainage has stopped.  DVT Prophylaxis - Eliquis Sling to remain on the left upper extremity  , PA-C Orthopaedic Surgery 01/18/2020, 9:51 AM

## 2020-01-18 NOTE — Discharge Summary (Signed)
Physician Discharge Summary  Subjective: 1 Day Post-Op Procedure(s) (LRB): ARTHROSCOPY SHOULDER WASH OUT (Left) Patient reports pain as mild.   Patient seen in rounds with Dr. Posey Pronto. Patient is well, and has had no acute complaints or problems Patient is ready to go home after the PICC line is placed, and they do IV treatment.  Physician Discharge Summary  Patient ID: Ruth Gray MRN: [id number] DOB/AGE: [birth year] 74 y.o.  Admit date: 01/17/2020 Discharge date: 01/18/2020  Admission Diagnoses:  Discharge Diagnoses:  Active Problems:   Septic arthritis of shoulder, left [facility])   Discharged Condition: fair  Hospital Course: The patient had an I&D of the left shoulder for septic arthritis.  She had cultures taken in the OR, and is on IV Abx.  She is more comfortable in the sling and in bed.  She is ready to go home today after the PICC line and teaching about the Antibiotics.    Treatments: surgery:  1. Arthroscopic Irrigation and debridement of L shoulder 2. Extensive glenohumeral debridement of L shoulder 3. Complete synovectomy of L shoulder  SURGEON: Cato Mulligan, MD  ASSISTANTS: Cassell Smiles, PA  ANESTHESIA: Gen   ESTIMATED BLOOD LOSS:5cc  TOTAL IV FLUIDS: per anesthesia  DRAIN: Hemovac x 2  Discharge Exam: Blood pressure (!) 128/91, pulse 74, temperature 97.6 F (36.4 C), resp. rate 17, height 5\' 7"  (1.702 m), weight 99.8 kg, SpO2 100 %.   Disposition: Discharge disposition: 01-Home or Self Care        Allergies as of 01/18/2020      Reactions   Ciprofloxacin Swelling, Other (See Comments)   Joint pain   Tramadol Nausea And Vomiting   Had taken in past with no trouble but last prescription had nausea and vomiting   Penicillins Swelling, Rash   Has patient had a PCN reaction causing immediate rash, facial/tongue/throat swelling, SOB or lightheadedness with hypotension: yes Has patient had a PCN reaction causing severe rash involving mucus  membranes or skin necrosis: no Has patient had a PCN reaction that required hospitalization no Has patient had a PCN reaction occurring within the last 10 years: no If all of the above answers are "NO", then may proceed with Cephalosporin use.      Medication List    TAKE these medications   acetaminophen 500 MG tablet Commonly known as: TYLENOL Take 1,000 mg by mouth every 8 (eight) hours as needed.   apixaban 2.5 MG Tabs tablet Commonly known as: Eliquis Take 1 tablet (2.5 mg total) by mouth 2 (two) times daily.   CALCIUM 600+D3 PO Take 2 tablets by mouth daily.   carbonyl iron 45 MG Tabs tablet Commonly known as: FEOSOL Take 45 mg by mouth daily. In the morning.   doxycycline 100 MG tablet Commonly known as: VIBRA-TABS Take 1 tablet (100 mg total) by mouth 2 (two) times daily.   ferrous sulfate 325 (65 FE) MG EC tablet Take 1 tablet (325 mg total) by mouth daily.   folic acid 1 MG tablet Commonly known as: FOLVITE Take 2 tablets by mouth 1 day or 1 dose.   hydroxychloroquine 200 MG tablet Commonly known as: PLAQUENIL TAKE 1 TABLET BY MOUTH EVERY DAY   metoprolol tartrate 25 MG tablet Commonly known as: LOPRESSOR TAKE 1/2 TABLET (12.5 MG TOTAL) BY MOUTH 2 (TWO) TIMES DAILY.   ondansetron 4 MG tablet Commonly known as: ZOFRAN Take 1 tablet (4 mg total) by mouth every 6 (six) hours as needed for nausea.   oxyCODONE  5 MG immediate release tablet Commonly known as: Oxy IR/ROXICODONE Take 1-2 tablets (5-10 mg total) by mouth every 4 (four) hours as needed for moderate pain (pain score 4-6).   Simponi Aria 50 MG/4ML Soln injection Generic drug: golimumab Inject into the vein every 8 (eight) weeks. Infusion given at [facility]      Follow-up Information    Signa Kell, MD. Schedule an appointment as soon as possible for a visit in 2 week(s).   Specialty: Orthopedic Surgery Contact information: [address] [place]  [address] [phone]        Dedra Skeens, PA-C. Schedule an appointment as soon as possible for a visit on 01/21/2020.   Specialty: Orthopedic Surgery Why: PM appointment Contact information: [address] [place] [address] [phone]           Signed: Lenard Forth, Tawanna Cooler 01/18/2020, 12:33 PM   Objective: Vital signs in last 24 hours: Temp:  [97.1 F (36.2 C)-98.1 F (36.7 C)] 97.6 F (36.4 C) (02/26 1230) Pulse Rate:  [74-130] 74 (02/26 1230) Resp:  [10-17] 17 (02/26 1230) BP: (107-143)/(63-91) 128/91 (02/26 1230) SpO2:  [93 %-100 %] 100 % (02/26 1230) Weight:  [99.8 kg] 99.8 kg (02/25 2350)  Intake/Output from previous day:  Intake/Output Summary (Last 24 hours) at 01/18/2020 1233 Last data filed at 01/18/2020 1006 Gross per 24 hour  Intake 1744.27 ml  Output 525 ml  Net 1219.27 ml    Intake/Output this shift: Total I/O In: -  Out: 490 [Urine:400; Drains:90]  Labs: Recent Labs    01/18/20 0453  HGB 13.0   Recent Labs    01/18/20 0453  WBC 6.4  RBC 4.32  HCT 40.5  PLT 199   Recent Labs    01/18/20 0453  NA 136  K 4.7  CL 105  CO2 23  BUN 16  CREATININE 1.15*  GLUCOSE 157*  CALCIUM 8.6*   No results for input(s): LABPT, INR in the last 72 hours.  EXAM: General - Patient is Alert and Oriented Extremity - Neurovascular intact Sensation intact distally Incision - clean, dry, with the hemovac still draining 30 cc's over 12 hours.   Motor Function -  Grip and shoulder posterior pressure intact.    Assessment/Plan: 1 Day Post-Op Procedure(s) (LRB): ARTHROSCOPY SHOULDER WASH OUT (Left) Procedure(s) (LRB): ARTHROSCOPY SHOULDER WASH OUT (Left) Past Medical History:  Diagnosis Date  . Anemia   . Collagen vascular disease (HCC)   . DVT (deep venous thrombosis) (HCC) 10/2016   left leg  . Perforation of colon (HCC) 08/2016  . Rheumatoid arteritis (HCC)    Active Problems:   Septic arthritis of shoulder, left  (HCC)  Estimated body mass index is 34.46 kg/m as calculated from the following:   Height as of this encounter: 5\' 7"  (1.702 m).   Weight as of this encounter: 99.8 kg. Advance diet Up with therapy  IV abx after PICC line.    Diet - Regular diet Follow up - in 3 days Activity - WBAT with left shoulder Disposition - Home Condition Upon Discharge - Fair DVT Prophylaxis - Eliquis  , PA-C Orthopaedic Surgery 01/18/2020, 12:33 PM

## 2020-01-18 NOTE — Consult Note (Signed)
NAME: Ruth Gray  DOB: 08-31-1946  MRN: [id number]  Date/Time: 01/18/2020 10:06 AM  REQUESTING PROVIDER: Dr.PAtel Subjective:  REASON FOR CONSULT: left shoulder septic arthritis ? Ruth Gray is a 74 y.o. female with a history of rheumatoid arthritis on simponi aria, left TKA,, lumbar disc laminectomy, hysterectomy  presents with left should septic arthritis Pt developed sudden onset chills on 01/16/20 and then developed severe left shoulder pain. She couldn't sleep the whole night. She went to [facility] on 01/17/20 and the left shoulder was aspirated and it showed 116K wbc and 98% N. She was admitted to the hospital  and underwent arthroscopic irrigation and debridement of left shoulder joint by Dr.Patel- She was started on vanco and ceftriaxone and I am asked to see her for the same Pt says she is doing okay No fever , no skin lesions, no trauma, no bites No recent antibiotics, no recent steroids Past Medical History:  Diagnosis Date  . Anemia   . Collagen vascular disease (HCC)   . DVT (deep venous thrombosis) (HCC) 10/2016   left leg  . Perforation of colon (HCC) 08/2016  . Rheumatoid arteritis (HCC)     Past Surgical History:  Procedure Laterality Date  . ABDOMINAL HYSTERECTOMY    . BACK SURGERY  2005   slipped disc lower back, [place]  . CENTRAL VENOUS CATHETER INSERTION  09/21/2016   Procedure: INSERTION CENTRAL LINE ADULT;  Surgeon: Tiney Rouge III, MD;  Location: [facility] ORS;  Service: General;;  . COLON RESECTION  09/21/2016   Procedure: COLON RESECTION- Ascending and Sigmoid;  Surgeon: Tiney Rouge III, MD;  Location: [facility] OR[facility];  Service: General;;  . COLOSTOMY N/A 09/25/2016   Procedure: COLOSTOMY;  Surgeon: Tiney Rouge III, MD;  Location: [facility];  Service: General;  Laterality: N/A;  . DEBRIDEMENT OF ABDOMINAL WALL ABSCESS N/A 12/03/2016   Procedure: DEBRIDEMENT OF ABDOMINAL WALL ABSCESS;  Surgeon: Leafy Ro, MD;  Location: [facility];  Service: General;  Laterality: N/A;  . FOOT  SURGERY Left   . ILEOSTOMY  08/24/2017   Procedure: ILEOSTOMY Loop Creation;  Surgeon: Ricarda Frame, MD;  Location: [facility];  Service: General;;  . ILEOSTOMY CLOSURE N/A 08/24/2017   Procedure: ILEOSTOMY REVERSAL X3;  Surgeon: Ricarda Frame, MD;  Location: [facility];  Service: General;  Laterality: N/A;  . ILEOSTOMY CLOSURE N/A 12/07/2017   Procedure: ILEOSTOMY TAKEDOWN;  Surgeon: Ricarda Frame, MD;  Location: [facility];  Service: General;  Laterality: N/A;  . JOINT REPLACEMENT Left 2012   knee  . LAPAROTOMY N/A 09/21/2016   Procedure: EXPLORATORY LAPAROTOMY;  Surgeon: Tiney Rouge III, MD;  Location: [facility];  Service: General;  Laterality: N/A;  . LAPAROTOMY N/A 09/25/2016   Procedure: EXPLORATORY LAPAROTOMY and right colon resection;  Surgeon: Tiney Rouge III, MD;  Location: [facility];  Service: General;  Laterality: N/A;  . LAPAROTOMY  08/24/2017   Procedure: EXPLORATORY LAPAROTOMY;  Surgeon: Ricarda Frame, MD;  Location: [facility];  Service: General;;  . REPLACEMENT TOTAL KNEE Left   . SKIN GRAFT Left    lateral lower calf above ankle    Social History   Socioeconomic History  . Marital status: Widowed    Spouse name: Not on file  . Number of children: Not on file  . Years of education: Not on file  . Highest education level: Not on file  Occupational History  . Not on file  Tobacco Use  . Smoking status: Former Smoker    Packs/day: 0.50  Years: 35.00    Pack years: 17.50    Types: Cigarettes    Quit date: 07/15/2016    Years since quitting: 3.5  . Smokeless tobacco: Never Used  Substance and Sexual Activity  . Alcohol use: No  . Drug use: No  . Sexual activity: Not Currently  Other Topics Concern  . Not on file  Social History Narrative  . Not on file   Social Determinants of Health   Financial Resource Strain:   . Difficulty of Paying Living Expenses: Not on file  Food Insecurity:   . Worried About Programme researcher, broadcasting/film/video in the Last Year: Not on file  . Ran  Out of Food in the Last Year: Not on file  Transportation Needs:   . Lack of Transportation (Medical): Not on file  . Lack of Transportation (Non-Medical): Not on file  Physical Activity:   . Days of Exercise per Week: Not on file  . Minutes of Exercise per Session: Not on file  Stress:   . Feeling of Stress : Not on file  Social Connections:   . Frequency of Communication with Friends and Family: Not on file  . Frequency of Social Gatherings with Friends and Family: Not on file  . Attends Religious Services: Not on file  . Active Member of Clubs or Organizations: Not on file  . Attends Banker Meetings: Not on file  . Marital Status: Not on file  Intimate Partner Violence:   . Fear of Current or Ex-Partner: Not on file  . Emotionally Abused: Not on file  . Physically Abused: Not on file  . Sexually Abused: Not on file    Family History  Problem Relation Age of Onset  . Breast cancer Other 36  . Prostate cancer Father   . Heart attack Father   . Ovarian cancer Sister   . Kidney cancer Brother   . Lung cancer Sister   . Kidney disease Mother    Allergies  Allergen Reactions  . Ciprofloxacin Swelling and Other (See Comments)    Joint pain  . Tramadol Nausea And Vomiting    Had taken in past with no trouble but last prescription had nausea and vomiting  . Penicillins Swelling and Rash    Has patient had a PCN reaction causing immediate rash, facial/tongue/throat swelling, SOB or lightheadedness with hypotension: yes Has patient had a PCN reaction causing severe rash involving mucus membranes or skin necrosis: no Has patient had a PCN reaction that required hospitalization no Has patient had a PCN reaction occurring within the last 10 years: no If all of the above answers are "NO", then may proceed with Cephalosporin use.     ? Current Facility-Administered Medications  Medication Dose Route Frequency Provider Last Rate Last Admin  . 0.9 %  sodium chloride  infusion   Intravenous Continuous Signa Kell, MD 75 mL/hr at 01/18/20 0500 Rate Verify at 01/18/20 0500  . acetaminophen (TYLENOL) tablet 1,000 mg  1,000 mg Oral Q8H Signa Kell, MD   1,000 mg at 01/18/20 0528  . alum & mag hydroxide-simeth (MAALOX/MYLANTA) 200-200-20 MG/5ML suspension 30 mL  30 mL Oral Q4H PRN Signa Kell, MD      . apixaban Everlene Balls) tablet 2.5 mg  2.5 mg Oral BID Signa Kell, MD   2.5 mg at 01/18/20 0933  . bisacodyl (DULCOLAX) suppository 10 mg  10 mg Rectal Daily PRN Signa Kell, MD      . calcium-vitamin D (OSCAL WITH D) 500-200  MG-UNIT per tablet   Oral Daily Signa Kell, MD   1 tablet at 01/18/20 0932  . cefTRIAXone (ROCEPHIN) 1 g in sodium chloride 0.9 % 100 mL IVPB  1 g Intravenous Q24H Signa Kell, MD      . diphenhydrAMINE (BENADRYL) 12.5 MG/5ML elixir 12.5-25 mg  12.5-25 mg Oral Q4H PRN Signa Kell, MD      . docusate sodium (COLACE) capsule 100 mg  100 mg Oral BID Signa Kell, MD   100 mg at 01/18/20 0932  . ferrous sulfate tablet 325 mg  325 mg Oral Daily Signa Kell, MD   325 mg at 01/18/20 0932  . HYDROmorphone (DILAUDID) injection 0.2-0.4 mg  0.2-0.4 mg Intravenous Q2H PRN Signa Kell, MD      . hydroxychloroquine (PLAQUENIL) tablet 200 mg  200 mg Oral Daily Signa Kell, MD   200 mg at 01/18/20 0932  . menthol-cetylpyridinium (CEPACOL) lozenge 3 mg  1 lozenge Oral PRN Signa Kell, MD       Or  . phenol (CHLORASEPTIC) mouth spray 1 spray  1 spray Mouth/Throat PRN Signa Kell, MD      . metoCLOPramide (REGLAN) tablet 5-10 mg  5-10 mg Oral Q8H PRN Signa Kell, MD       Or  . metoCLOPramide (REGLAN) injection 5-10 mg  5-10 mg Intravenous Q8H PRN Signa Kell, MD      . metoprolol tartrate (LOPRESSOR) tablet 12.5 mg  12.5 mg Oral BID Signa Kell, MD   12.5 mg at 01/18/20 0932  . ondansetron (ZOFRAN) tablet 4 mg  4 mg Oral Q6H PRN Signa Kell, MD       Or  . ondansetron [facility]) injection 4 mg  4 mg Intravenous Q6H PRN Signa Kell, MD      .  oxyCODONE (Oxy IR/ROXICODONE) immediate release tablet 10-15 mg  10-15 mg Oral Q3H PRN Signa Kell, MD      . oxyCODONE (Oxy IR/ROXICODONE) immediate release tablet 5-10 mg  5-10 mg Oral Q3H PRN Signa Kell, MD      . senna-docusate (Senokot-S) tablet 1 tablet  1 tablet Oral QHS PRN Signa Kell, MD      . vancomycin (VANCOCIN) IVPB 1000 mg/200 mL premix  1,000 mg Intravenous Q24H Hallaji, Mardene Speak, RPH 200 mL/hr at 01/18/20 0935 1,000 mg at 01/18/20 0935   Facility-Administered Medications Ordered in Other Encounters  Medication Dose Route Frequency Provider Last Rate Last Admin  . dexamethasone (DECADRON) injection   Intravenous Anesthesia Intra-op Almeta Monas, CRNA   10 mg at 01/17/20 2105  . dexmedetomidine (PRECEDEX) injection   Intravenous Anesthesia Intra-op Almeta Monas, CRNA   16 mcg at 01/17/20 2040  . fentaNYL (SUBLIMAZE) injection   Intravenous Anesthesia Intra-op Almeta Monas, CRNA   50 mcg at 01/17/20 2224  . lactated ringers infusion   Intravenous Continuous PRN Almeta Monas, CRNA   New Bag at 01/17/20 2010  . lidocaine (cardiac) 100 mg/65mL (XYLOCAINE) injection 2%   Intravenous Anesthesia Intra-op Almeta Monas, CRNA   100 mg at 01/17/20 2023  . ondansetron (ZOFRAN) injection   Intravenous Anesthesia Intra-op Almeta Monas, CRNA   4 mg at 01/17/20 2105  . propofol (DIPRIVAN) 10 mg/mL bolus/IV push   Intravenous Anesthesia Intra-op Almeta Monas, CRNA   150 mg at 01/17/20 2023  . rocuronium (ZEMURON) injection   Intravenous Anesthesia Intra-op Almeta Monas, CRNA   30 mg at 01/17/20 2033  . succinylcholine (ANECTINE) injection   Intravenous Anesthesia Intra-op Almeta Monas, CRNA   100 mg  at 01/17/20 2025  . sugammadex sodium (BRIDION) injection   Intravenous Anesthesia Intra-op Justus Memory, CRNA   100 mg at 01/17/20 2227     Abtx:  Anti-infectives (From admission, onward)   Start     Dose/Rate Route Frequency Ordered Stop    01/18/20 2100  cefTRIAXone (ROCEPHIN) 1 g in sodium chloride 0.9 % 100 mL IVPB     1 g 200 mL/hr over 30 Minutes Intravenous Every 24 hours 01/18/20 0337     01/18/20 1000  hydroxychloroquine (PLAQUENIL) tablet 200 mg     200 mg Oral Daily 01/17/20 2347     01/18/20 1000  vancomycin (VANCOCIN) IVPB 1000 mg/200 mL premix  Status:  Discontinued     1,000 mg 200 mL/hr over 60 Minutes Intravenous Every 12 hours 01/18/20 0052 01/18/20 0825   01/18/20 1000  vancomycin (VANCOCIN) IVPB 1000 mg/200 mL premix     1,000 mg 200 mL/hr over 60 Minutes Intravenous Every 24 hours 01/18/20 0825     01/18/20 0300  cefTRIAXone (ROCEPHIN) 1 g in sodium chloride 0.9 % 100 mL IVPB  Status:  Discontinued     1 g 200 mL/hr over 30 Minutes Intravenous Every 24 hours 01/17/20 2347 01/18/20 0337   01/18/20 0000  doxycycline (VIBRA-TABS) tablet 100 mg  Status:  Discontinued     100 mg Oral 2 times daily 01/17/20 2347 01/18/20 0814   01/18/20 0000  vancomycin (VANCOCIN) IVPB 1000 mg/200 mL premix  Status:  Discontinued     1,000 mg 200 mL/hr over 60 Minutes Intravenous Every 12 hours 01/17/20 2347 01/18/20 0052   01/17/20 1945  vancomycin (VANCOCIN) IVPB 1000 mg/200 mL premix    Note to Pharmacy: Please deliver to OR ASAP.   1,000 mg 200 mL/hr over 60 Minutes Intravenous  Once 01/17/20 1939 01/17/20 2214   01/17/20 1945  cefTRIAXone (ROCEPHIN) 1 g in sodium chloride 0.9 % 100 mL IVPB    Note to Pharmacy: Deliver to OR   1 g 200 mL/hr over 30 Minutes Intravenous  Once 01/17/20 1939 01/17/20 2100      REVIEW OF SYSTEMS:  Const:  Chills, no documented fever, negative weight loss Eyes: negative diplopia or visual changes, negative eye pain ENT: negative coryza, negative sore throat Resp: negative cough, hemoptysis, dyspnea Cards: negative for chest pain, palpitations, lower extremity edema GU: negative for frequency, dysuria and hematuria GI: Negative for abdominal pain, diarrhea, bleeding,  constipation Skin: negative for rash and pruritus Heme: negative for easy bruising and gum/nose bleeding MS: negative for myalgias, arthralgias, back pain and muscle weakness, severe left shoulder pain Neurolo:negative for headaches, dizziness, vertigo, memory problems  Psych: negative for feelings of anxiety, depression  Endocrine: negative for thyroid, diabetes Allergy/Immunology- as above Objective:  VITALS:  BP 107/72 (BP Location: Right Arm)   Pulse 92   Temp 97.7 F (36.5 C) (Oral)   Resp 17   Ht 5\' 7"  (1.702 m)   Wt 99.8 kg   SpO2 100%   BMI 34.46 kg/m  PHYSICAL EXAM:  General: Alert, cooperative, no distress, appears stated age.  Head: Normocephalic, without obvious abnormality, atraumatic. Eyes: Conjunctivae clear, anicteric sclerae. Pupils are equal ENT Nares normal. No drainage or sinus tenderness. Lips, mucosa, and tongue normal. No Thrush Neck: Supple, symmetrical, no adenopathy, thyroid: non tender no carotid bruit and no JVD. Lungs: b/l air entry Heart: Regular rate and rhythm, no murmur, rub or gallop. Abdomen: Soft, non-tender,not distended. Bowel sounds normal. No masses, scar Extremities: left shoulder  in sling- surgical site covered with dressing, drain present Skin: No rashes or lesions. Or bruising Lymph: Cervical, supraclavicular normal. Neurologic: Grossly non-focal Pertinent Labs Lab Results CBC    Component Value Date/Time   WBC 6.4 01/18/2020 0453   RBC 4.32 01/18/2020 0453   HGB 13.0 01/18/2020 0453   HCT 40.5 01/18/2020 0453   PLT 199 01/18/2020 0453   MCV 93.8 01/18/2020 0453   MCH 30.1 01/18/2020 0453   MCHC 32.1 01/18/2020 0453   RDW 11.9 01/18/2020 0453   LYMPHSABS 1.5 12/03/2019 1046   MONOABS 0.6 12/03/2019 1046   EOSABS 0.4 12/03/2019 1046   BASOSABS 0.0 12/03/2019 1046    CMP Latest Ref Rng & Units 01/18/2020 12/03/2019 06/08/2019  Glucose 70 - 99 mg/dL 460(Q) 799(Y) 86  BUN 8 - 23 mg/dL 16 17 16   Creatinine 0.44 - 1.00 mg/dL  7.21(L) 8.72(B) 6.18(M)  Sodium 135 - 145 mmol/L 136 138 138  Potassium 3.5 - 5.1 mmol/L 4.7 4.6 4.8  Chloride 98 - 111 mmol/L 105 103 104  CO2 22 - 32 mmol/L 23 25 25   Calcium 8.9 - 10.3 mg/dL 8.5(T) 8.9 8.9  Total Protein 6.5 - 8.1 g/dL - 7.9 7.8  Total Bilirubin 0.3 - 1.2 mg/dL - 0.5 0.5  Alkaline Phos 38 - 126 U/L - 90 95  AST 15 - 41 U/L - 20 20  ALT 0 - 44 U/L - 14 13      Microbiology: Recent Results (from the past 240 hour(s))  Respiratory Panel by RT PCR (Flu A&B, Covid) - Nasopharyngeal Swab     Status: None   Collection Time: 01/17/20  6:32 PM   Specimen: Nasopharyngeal Swab  Result Value Ref Range Status   SARS Coronavirus 2 by RT PCR NEGATIVE NEGATIVE Final    Comment: (NOTE) SARS-CoV-2 target nucleic acids are NOT DETECTED. The SARS-CoV-2 RNA is generally detectable in upper respiratoy specimens during the acute phase of infection. The lowest concentration of SARS-CoV-2 viral copies this assay can detect is 131 copies/mL. A negative result does not preclude SARS-Cov-2 infection and should not be used as the sole basis for treatment or other patient management decisions. A negative result may occur with  improper specimen collection/handling, submission of specimen other than nasopharyngeal swab, presence of viral mutation(s) within the areas targeted by this assay, and inadequate number of viral copies (<131 copies/mL). A negative result must be combined with clinical observations, patient history, and epidemiological information. The expected result is Negative. Fact Sheet for Patients:  https://www.moore.com/ Fact Sheet for Healthcare Providers:  https://www.young.biz/ This test is not yet ap proved or cleared by the [place] FDA and  has been authorized for detection and/or diagnosis of SARS-CoV-2 by FDA under an Emergency Use Authorization (EUA). This EUA will remain  in effect (meaning this test can be used) for  the duration of the COVID-19 declaration under Section 564(b)(1) of the Act, 21 U.S.C. section 360bbb-3(b)(1), unless the authorization is terminated or revoked sooner.    Influenza A by PCR NEGATIVE NEGATIVE Final   Influenza B by PCR NEGATIVE NEGATIVE Final    Comment: (NOTE) The Xpert Xpress SARS-CoV-2/FLU/RSV assay is intended as an aid in  the diagnosis of influenza from Nasopharyngeal swab specimens and  should not be used as a sole basis for treatment. Nasal washings and  aspirates are unacceptable for Xpert Xpress SARS-CoV-2/FLU/RSV  testing. Fact Sheet for Patients: https://www.moore.com/ Fact Sheet for Healthcare Providers: https://www.young.biz/ This test is not yet approved or cleared  by the [place] and  has been authorized for detection and/or diagnosis of SARS-CoV-2 by  FDA under an Emergency Use Authorization (EUA). This EUA will remain  in effect (meaning this test can be used) for the duration of the  Covid-19 declaration under Section 564(b)(1) of the Act, 21  U.S.C. section 360bbb-3(b)(1), unless the authorization is  terminated or revoked. Performed at [facility], [address], [place], [place] [address]     IMAGING RESULTS: None  ? Impression/Recommendation Left shoulder septic arthritis- acute onset- underlying Rheumatoid arthritis Likely organisms staph or streptococcus S/p arthroscopic debridement Culture pending currently on broad spectrum vanco and ceftriaxone Pt will be discharged today on these antibiotics  She will need 4 weeks of antibiotics which can be 2 weeks IV followed by 2 weeks oral if an oral antibiotic is available Once the culture is finalized the antibiotic will be de-escalated, changed accordingly  Pt will follow up with Dr.PAtel and Dr.Fitzgerald at [facility] early next week Discussed in great detail with the son, patient and Dr.PAtel. Also informed Dr.Fitzgerald    Rheumatoid arthitis- on Golimumab ( simponi Aria) TNFI She gets in once every 8 weeks- Next dose in 3/10- Recommend postpone the infusion due to infection  ? ? ? ___________________________________________________ Discussed with patient, requesting provider .

## 2020-01-18 NOTE — Progress Notes (Signed)
DISCHARGE NOTE:  Pt given discharge instructions. Pt verbalized understanding. PICC in place. Pts sling in place, 2 hemovacs in place. Pt and son educated on how to empty drains. Extra container sent. Pt wheeled to car by staff. Son transportation.

## 2020-01-18 NOTE — TOC Progression Note (Signed)
Transition of Care [facility]) - Progression Note    Patient Details  Name: Ruth Gray MRN: [id number] Date of Birth: 1946-05-19  [facility]) CM/SW Contact  Barrie Dunker, RN Phone Number: 01/18/2020, 1:44 PM  Clinical Narrative:   Called the son to verify the physical address for [facility], I notified teresa with [facility] of the physical address , I notified the doctor of the need for the order for [facility] nursing. Expected Discharge Plan: Home w Home Health Services Barriers to Discharge: Continued Medical Work up  Expected Discharge Plan and Services Expected Discharge Plan: Home w Home Health Services   Discharge Planning Services: CM Consult   Living arrangements for the past 2 months: Single Family Home Expected Discharge Date: 01/18/20               DME Arranged: N/A         HH Arranged: RN HH Agency: [facility] Date HH Agency Contacted: 01/18/20 Time HH Agency Contacted: 1147 Representative spoke with at [facility] Agency: Barbara Cower   Social Determinants of Health (SDOH) Interventions    Readmission Risk Interventions No flowsheet data found.

## 2020-01-18 NOTE — Consult Note (Signed)
Pharmacy Antibiotic Note  Ruth Gray is a 74 y.o. adult admitted on 01/17/2020 with septic arthritis of left shoulder. Patent now S/P Arthroscopic Irrigation and debridement of L shoulder and Complete synovectomy of L shoulder 2/25.  Pharmacy has been consulted for vancomycin dosing. Patient is also receiving ceftriaxone every 24 hours.   ID  has been consulted   Plan: Start Vancomycin 1000 mg IV Q 24 hrs. Goal AUC 400-550. Expected AUC: 499 SCr used: 1.14 Css:12.9    Height: 5\' 7"  (170.2 cm) Weight: 220 lb 0.3 oz (99.8 kg) IBW/kg (Calculated) : 61.6  Temp (24hrs), Avg:97.7 F (36.5 C), Min:97.1 F (36.2 C), Max:98.1 F (36.7 C)  Recent Labs  Lab 01/18/20 0453  WBC 6.4  CREATININE 1.15*    Estimated Creatinine Clearance (by C-G formula based on SCr of 1.15 mg/dL (H)) Female: 01/20/20 mL/min (A) Female: 65.4 mL/min (A)    Allergies  Allergen Reactions  . Ciprofloxacin Swelling and Other (See Comments)    Joint pain  . Tramadol Nausea And Vomiting    Had taken in past with no trouble but last prescription had nausea and vomiting  . Penicillins Swelling and Rash    Has patient had a PCN reaction causing immediate rash, facial/tongue/throat swelling, SOB or lightheadedness with hypotension: yes Has patient had a PCN reaction causing severe rash involving mucus membranes or skin necrosis: no Has patient had a PCN reaction that required hospitalization no Has patient had a PCN reaction occurring within the last 10 years: no If all of the above answers are "NO", then may proceed with Cephalosporin use.     Antimicrobials this admission: 2/25 ceftriaxone  >>  2/25 vancomycin >>   Microbiology results: Body fluid/wound Cx: pending   Thank you for allowing pharmacy to be a part of this patient's care.  3/25, PharmD, BCPS Clinical Pharmacist 01/18/2020 8:23 AM

## 2020-01-18 NOTE — TOC Initial Note (Signed)
Transition of Care [facility]) - Initial/Assessment Note    Patient Details  Name: Ruth Gray MRN: [id number] Date of Birth: [birth year]  Transition of Care [facility]) CM/SW Contact:    Su Hilt, RN Phone Number: 01/18/2020, 11:50 AM  Clinical Narrative:    Met with the patient to discuss DC plan and needs She has used HH in the past but doesn't know what agency She lives with her son She has a RW and a cane at home and does not need additional DME She would like to use [facility] for Nursing for PICC line care, I notified Jason with [facility] She stated that she does not need PT or OT at home I notified Pam with [facility] about the need for IV ABX Awaiting PICC line placement Will follow til DC for any additional needs              Expected Discharge Plan: [place] Barriers to Discharge: Continued Medical Work up   Patient Goals and CMS Choice Patient states their goals for this hospitalization and ongoing recovery are:: go home      Expected Discharge Plan and Services Expected Discharge Plan: [place]   Discharge Planning Services: CM Consult   Living arrangements for the past 2 months: Single Family Home                 DME Arranged: N/A         HH Arranged: RN [place] Agency: [facility] [facility] Date HH Agency Contacted: 01/18/20 Time HH Agency Contacted: 56 Representative spoke with at [place]: Corene Cornea  Prior Living Arrangements/Services Living arrangements for the past 2 months: [place] Lives with:: Adult Children Patient language and need for interpreter reviewed:: Yes Do you feel safe going back to the place where you live?: Yes      Need for Family Participation in Patient Care: No (Comment) Care giver support system in place?: Yes (comment) Current home services: DME(cane, RW) Criminal Activity/Legal Involvement Pertinent to Current Situation/Hospitalization: No - Comment as  needed  Activities of Daily Living Home Assistive Devices/Equipment: Cane (specify quad or straight), Walker (specify type) ADL Screening (condition at time of admission) Patient's cognitive ability adequate to safely complete daily activities?: Yes Is the patient deaf or have difficulty hearing?: No Does the patient have difficulty seeing, even when wearing glasses/contacts?: No Does the patient have difficulty concentrating, remembering, or making decisions?: No Patient able to express need for assistance with ADLs?: Yes Does the patient have difficulty dressing or bathing?: No Independently performs ADLs?: Yes (appropriate for developmental age) Does the patient have difficulty walking or climbing stairs?: No Weakness of Legs: None Weakness of Arms/Hands: None  Permission Sought/Granted   Permission granted to share information with : Yes, Verbal Permission Granted              Emotional Assessment Appearance:: Appears stated age Attitude/Demeanor/Rapport: Engaged Affect (typically observed): Appropriate Orientation: : Oriented to Self, Oriented to Situation, Oriented to Place, Oriented to  Time Alcohol / Substance Use: Not Applicable Psych Involvement: No (comment)  Admission diagnosis:  Septic arthritis of shoulder, left ([place]) [M00.9] Patient Active Problem List   Diagnosis Date Noted  . Septic arthritis of shoulder, left ([place]) 01/17/2020  . H/O ileostomy 12/07/2017  . Ileostomy in place [facility]) 06/17/2017  . Surgical wound, non healing 03/15/2017  . Neutropenia ([place]) 02/25/2017  . Abscess of abdominal wall 12/02/2016  . Acute deep  vein thrombosis (DVT) of distal vein of left lower extremity ([place]) 11/29/2016  . B12 deficiency 10/26/2016  . Osteoporosis 10/26/2016  . Protein-calorie malnutrition, severe 10/06/2016  . Acute respiratory failure ([place])   . Perforation of cecum due to diverticulitis   . SBO (small bowel obstruction) ([place])   . Diverticulitis of large  intestine without perforation or abscess without bleeding   . Acute diverticulitis 09/19/2016  . Abdominal pain 09/19/2016  . Intractable nausea and vomiting 09/19/2016  . Rheumatoid arthritis ([place]) 09/19/2016  . 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 Acute pain of right knee 06/08/2016  . Primary osteoarthritis of right knee 06/08/2016  . Coronary artery calcification seen on CAT scan 04/30/2016  . Tachycardia 01/20/2015  . Encounter for long-term (current) use of other medications 03/14/2014  . Osteoarthritis 03/07/2014   PCP:  Baxter Hire, MD Pharmacy:   [id number] [place] [place] [address]Phone: [phone]: [phone]    Social Determinants of Health (SDOH) Interventions    Readmission Risk Interventions No flowsheet data found.

## 2020-01-18 NOTE — TOC Progression Note (Signed)
Transition of Care [facility]) - Progression Note    Patient Details  Name: Ruth Gray MRN: [id number] Date of Birth: 01/28/1946  Transition of Care [facility]) CM/SW Contact  Barrie Dunker, RN Phone Number: 01/18/2020, 1:36 PM  Clinical Narrative:   [facility] due to staffing, [facility] has agreed to accept the patient for the nursing    Expected Discharge Plan: Home w Home Health Services Barriers to Discharge: Continued Medical Work up  Expected Discharge Plan and Services Expected Discharge Plan: Home w Home Health Services   Discharge Planning Services: CM Consult   Living arrangements for the past 2 months: Single Family Home Expected Discharge Date: 01/18/20               DME Arranged: N/A         HH Arranged: RN HH Agency: [facility] Date HH Agency Contacted: 01/18/20 Time HH Agency Contacted: 1147 Representative spoke with at [facility] Agency: Barbara Cower   Social Determinants of Health (SDOH) Interventions    Readmission Risk Interventions No flowsheet data found.

## 2020-01-22 NOTE — Anesthesia Postprocedure Evaluation (Signed)
Anesthesia Post Note  Patient: Ruth Gray  Procedure(s) Performed: ARTHROSCOPY SHOULDER WASH OUT (Left Shoulder)  Patient location during evaluation: PACU Anesthesia Type: General Level of consciousness: awake and alert Pain management: pain level controlled Vital Signs Assessment: post-procedure vital signs reviewed and stable Respiratory status: spontaneous breathing, nonlabored ventilation, respiratory function stable and patient connected to nasal cannula oxygen Cardiovascular status: blood pressure returned to baseline and stable Postop Assessment: no apparent nausea or vomiting Anesthetic complications: no     Last Vitals:  Vitals:   01/18/20 1230 01/18/20 1537  BP: (!) 128/91 131/70  Pulse: 74 83  Resp: 17 17  Temp: 36.4 C 36.6 C  SpO2: 100% 99%    Last Pain:  Vitals:   01/18/20 1537  TempSrc: Oral  PainSc:                  Lenard Simmer

## 2020-01-23 LAB — AEROBIC/ANAEROBIC CULTURE W GRAM STAIN (SURGICAL/DEEP WOUND)
Culture: NO GROWTH
Culture: NO GROWTH

## 2020-06-02 ENCOUNTER — Inpatient Hospital Stay (HOSPITAL_BASED_OUTPATIENT_CLINIC_OR_DEPARTMENT_OTHER): Payer: Medicare Other | Admitting: Oncology

## 2020-06-02 ENCOUNTER — Other Ambulatory Visit: Payer: Self-pay

## 2020-06-02 ENCOUNTER — Encounter: Payer: Self-pay | Admitting: Oncology

## 2020-06-02 ENCOUNTER — Inpatient Hospital Stay: Payer: Medicare Other | Attending: Oncology

## 2020-06-02 VITALS — BP 141/83 | HR 65 | Temp 97.5°F | Resp 18 | Wt 239.9 lb

## 2020-06-02 DIAGNOSIS — Z8051 Family history of malignant neoplasm of kidney: Secondary | ICD-10-CM | POA: Diagnosis not present

## 2020-06-02 DIAGNOSIS — Z801 Family history of malignant neoplasm of trachea, bronchus and lung: Secondary | ICD-10-CM | POA: Insufficient documentation

## 2020-06-02 DIAGNOSIS — Z803 Family history of malignant neoplasm of breast: Secondary | ICD-10-CM | POA: Diagnosis not present

## 2020-06-02 DIAGNOSIS — D638 Anemia in other chronic diseases classified elsewhere: Secondary | ICD-10-CM

## 2020-06-02 DIAGNOSIS — Z8042 Family history of malignant neoplasm of prostate: Secondary | ICD-10-CM | POA: Insufficient documentation

## 2020-06-02 DIAGNOSIS — D6852 Prothrombin gene mutation: Secondary | ICD-10-CM

## 2020-06-02 DIAGNOSIS — Z90710 Acquired absence of both cervix and uterus: Secondary | ICD-10-CM | POA: Insufficient documentation

## 2020-06-02 DIAGNOSIS — Z86718 Personal history of other venous thrombosis and embolism: Secondary | ICD-10-CM | POA: Diagnosis present

## 2020-06-02 DIAGNOSIS — Z7901 Long term (current) use of anticoagulants: Secondary | ICD-10-CM | POA: Diagnosis not present

## 2020-06-02 DIAGNOSIS — Z79899 Other long term (current) drug therapy: Secondary | ICD-10-CM | POA: Insufficient documentation

## 2020-06-02 DIAGNOSIS — Z8249 Family history of ischemic heart disease and other diseases of the circulatory system: Secondary | ICD-10-CM | POA: Diagnosis not present

## 2020-06-02 DIAGNOSIS — I825Y2 Chronic embolism and thrombosis of unspecified deep veins of left proximal lower extremity: Secondary | ICD-10-CM | POA: Diagnosis not present

## 2020-06-02 DIAGNOSIS — Z87891 Personal history of nicotine dependence: Secondary | ICD-10-CM | POA: Insufficient documentation

## 2020-06-02 DIAGNOSIS — Z8041 Family history of malignant neoplasm of ovary: Secondary | ICD-10-CM | POA: Diagnosis not present

## 2020-06-02 LAB — COMPREHENSIVE METABOLIC PANEL
ALT: 14 U/L (ref 0–44)
AST: 20 U/L (ref 15–41)
Albumin: 3.8 g/dL (ref 3.5–5.0)
Alkaline Phosphatase: 92 U/L (ref 38–126)
Anion gap: 9 (ref 5–15)
BUN: 14 mg/dL (ref 8–23)
CO2: 26 mmol/L (ref 22–32)
Calcium: 8.8 mg/dL — ABNORMAL LOW (ref 8.9–10.3)
Chloride: 103 mmol/L (ref 98–111)
Creatinine, Ser: 1.1 mg/dL — ABNORMAL HIGH (ref 0.44–1.00)
GFR calc Af Amer: 58 mL/min — ABNORMAL LOW (ref >60)
GFR calc non Af Amer: 50 mL/min — ABNORMAL LOW (ref >60)
Glucose, Bld: 103 mg/dL — ABNORMAL HIGH (ref 70–99)
Potassium: 4.3 mmol/L (ref 3.5–5.1)
Sodium: 138 mmol/L (ref 135–145)
Total Bilirubin: 0.5 mg/dL (ref 0.3–1.2)
Total Protein: 7.9 g/dL (ref 6.5–8.1)

## 2020-06-02 LAB — CBC WITH DIFFERENTIAL/PLATELET
Abs Immature Granulocytes: 0.01 10*3/uL (ref 0.00–0.07)
Basophils Absolute: 0 10*3/uL (ref 0.0–0.1)
Basophils Relative: 1 %
Eosinophils Absolute: 0.2 10*3/uL (ref 0.0–0.5)
Eosinophils Relative: 5 %
HCT: 39.7 % (ref 36.0–46.0)
Hemoglobin: 13.4 g/dL (ref 12.0–15.0)
Immature Granulocytes: 0 %
Lymphocytes Relative: 27 %
Lymphs Abs: 1.4 10*3/uL (ref 0.7–4.0)
MCH: 30.4 pg (ref 26.0–34.0)
MCHC: 33.8 g/dL (ref 30.0–36.0)
MCV: 90 fL (ref 80.0–100.0)
Monocytes Absolute: 0.5 10*3/uL (ref 0.1–1.0)
Monocytes Relative: 10 %
Neutro Abs: 3 10*3/uL (ref 1.7–7.7)
Neutrophils Relative %: 57 %
Platelets: 187 10*3/uL (ref 150–400)
RBC: 4.41 MIL/uL (ref 3.87–5.11)
RDW: 12.5 % (ref 11.5–15.5)
WBC: 5.2 10*3/uL (ref 4.0–10.5)
nRBC: 0 % (ref 0.0–0.2)

## 2020-06-02 LAB — IRON AND TIBC
Iron: 142 ug/dL (ref 28–170)
Saturation Ratios: 38 % — ABNORMAL HIGH (ref 10.4–31.8)
TIBC: 372 ug/dL (ref 250–450)
UIBC: 230 ug/dL

## 2020-06-02 LAB — FERRITIN: Ferritin: 89 ng/mL (ref 11–307)

## 2020-06-02 NOTE — Progress Notes (Signed)
Hematology/Oncology Follow up note [facility] [phone] [phone]   Patient Care Team: Gracelyn Nurse, MD as PCP - General (Internal Medicine)  REFERRING PROVIDER: Gracelyn Nurse, MD CHIEF COMPLAINTS/PURPOSE OF CONSULTATION:  Evaluation of DVT  HISTORY OF PRESENTING ILLNESS:  Ruth Gray is a  74 y.o.  adult with PMH listed below who was referred to me for evaluation of DVT. Patient had first episode of provoked proximal DVT of left lower extremity (occlusive thrombus throughout the left lower extremity.) in Dec 2017, after a prolonged hospital and rehab stay after partial colon resection and creation of end ileostomy due to perforated diverticulosis.  She was started on Eliquis for anticoagulation for 3 months and came off anticoagulation.  She lives with her son in [place], and found to have left lower extremity swelling again. On 02/25/2017, patient has repeat US venous lower extremity and was found to have recurrent occlusive thrombosis of the left common femoral vein and proximal left femoral vein  She was restarted on Eliquis again.  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 There is nonocclusive thrombus throughout the popliteal vein. Patient was referred to me for decision of anticoagulation duration.  Patient also has RA following with Dr.Kernodle, on MTX and folic acid treatment.  Patient reports tolerating anticoagulation without any bleeding events.   #Hypercoagulable workup reviewed positive for single prothrombin gene mutation, heterozygote  # Repeat ultrasound showed stable appearance of chronic DVT. She has completed about 12 months of full dose anticoagulation with Eliquis, decision was made to decrease to Eliquis 2.5mg  BID for long term anticoagulation. (AMPLIFY extension trial).   INTERVAL HISTORY Ruth Gray is a 74 y.o. adult who has above history reviewed by me today presents for follow up visit for management of DVT and chronic anticoagulation.  Patient is on long-term anticoagulation with Eliquis 2.5 mg twice daily.  Patient reports feeling well.  Denies any shortness of breath, chest pain, weight loss, fever or chills.  .Review of Systems  Constitutional: Negative for chills, fever, malaise/fatigue and weight loss.  HENT: Negative for hearing loss, nosebleeds and sore throat.   Eyes: Negative for blurred vision, double vision, photophobia and redness.  Respiratory: Negative for cough, hemoptysis, sputum production, shortness of breath and wheezing.   Cardiovascular: Negative for chest pain, palpitations, orthopnea and leg swelling.  Gastrointestinal: Negative for abdominal pain, blood in stool, heartburn, nausea and vomiting.  Genitourinary: Negative for dysuria.  Musculoskeletal: Positive for joint pain. Negative for back pain, myalgias and neck pain.  Skin: Negative for itching and rash.  Neurological: Negative for dizziness, tingling, tremors, sensory change and headaches.  Endo/Heme/Allergies: Negative for environmental allergies. Does not bruise/bleed easily.  Psychiatric/Behavioral: Negative for depression, hallucinations and substance abuse. The patient is not nervous/anxious.     MEDICAL HISTORY:  Past Medical History:  Diagnosis Date  . Anemia   . Collagen vascular disease (HCC)   . DVT (deep venous thrombosis) (HCC) 10/2016   left leg  . Perforation of colon (HCC) 08/2016  . Rheumatoid arteritis (HCC)     SURGICAL HISTORY: Past Surgical History:  Procedure Laterality Date  . ABDOMINAL HYSTERECTOMY    . BACK SURGERY  2005   slipped disc lower back, [place]  . CENTRAL VENOUS CATHETER INSERTION  09/21/2016   Procedure: INSERTION CENTRAL LINE ADULT;  Surgeon: Tiney Rouge III, MD;  Location: [facility] ORS;  Service: General;;  . COLON RESECTION  09/21/2016   Procedure:  COLON RESECTION- Ascending and Sigmoid;  Surgeon: Tiney Rouge III, MD;  Location: [facility];  Service: General;;  . COLOSTOMY N/A 09/25/2016   Procedure: COLOSTOMY;  Surgeon: Tiney Rouge III, MD;  Location: [facility];  Service: General;  Laterality: N/A;  . DEBRIDEMENT OF ABDOMINAL WALL ABSCESS N/A 12/03/2016   Procedure: DEBRIDEMENT OF ABDOMINAL WALL ABSCESS;  Surgeon: Leafy Ro, MD;  Location: [facility] OR[facility];  Service: General;  Laterality: N/A;  . FOOT SURGERY Left   . ILEOSTOMY  08/24/2017   Procedure: ILEOSTOMY Loop Creation;  Surgeon: Ricarda Frame, MD;  Location: [facility];  Service: General;;  . ILEOSTOMY CLOSURE N/A 08/24/2017   Procedure: ILEOSTOMY REVERSAL X3;  Surgeon: Ricarda Frame, MD;  Location: [facility];  Service: General;  Laterality: N/A;  . ILEOSTOMY CLOSURE N/A 12/07/2017   Procedure: ILEOSTOMY TAKEDOWN;  Surgeon: Ricarda Frame, MD;  Location: [facility];  Service: General;  Laterality: N/A;  . JOINT REPLACEMENT Left 2012   knee  . LAPAROTOMY N/A 09/21/2016   Procedure: EXPLORATORY LAPAROTOMY;  Surgeon: Tiney Rouge III, MD;  Location: [facility];  Service: General;  Laterality: N/A;  . LAPAROTOMY N/A 09/25/2016   Procedure: EXPLORATORY LAPAROTOMY and right colon resection;  Surgeon: Tiney Rouge III, MD;  Location: [facility];  Service: General;  Laterality: N/A;  . LAPAROTOMY  08/24/2017   Procedure: EXPLORATORY LAPAROTOMY;  Surgeon: Ricarda Frame, MD;  Location: [facility];  Service: General;;  . REPLACEMENT TOTAL KNEE Left   . SHOULDER ARTHROSCOPY Left 01/17/2020   Procedure: ARTHROSCOPY SHOULDER WASH OUT;  Surgeon: Signa Kell, MD;  Location: [facility];  Service: Orthopedics;  Laterality: Left;  . SKIN GRAFT Left    lateral lower calf above ankle    SOCIAL HISTORY: Social History   Socioeconomic History  . Marital status: Widowed    Spouse name: Not on file  . Number of children: Not on file  . Years of education: Not on file  . Highest education level: Not on file    Occupational History  . Not on file  Tobacco Use  . Smoking status: Former Smoker    Packs/day: 0.50    Years: 35.00    Pack years: 17.50    Types: Cigarettes    Quit date: 07/15/2016    Years since quitting: 3.8  . Smokeless tobacco: Never Used  Vaping Use  . Vaping Use: Never used  Substance and Sexual Activity  . Alcohol use: No  . Drug use: No  . Sexual activity: Not Currently  Other Topics Concern  . Not on file  Social History Narrative  . Not on file   Social Determinants of Health   Financial Resource Strain:   . Difficulty of Paying Living Expenses:   Food Insecurity:   . Worried About Programme researcher, broadcasting/film/video in the Last Year:   . Barista in the Last Year:   Transportation Needs:   . Freight forwarder (Medical):   Marland Kitchen Lack of Transportation (Non-Medical):   Physical Activity:   . Days of Exercise per Week:   . Minutes of Exercise per Session:   Stress:   . Feeling of Stress :   Social Connections:   . Frequency of Communication with Friends and Family:   . Frequency of Social Gatherings with Friends and Family:   . Attends Religious Services:   . Active Member of Clubs or Organizations:   .  Attends Banker Meetings:   Marland Kitchen Marital Status:   Intimate Partner Violence:   . Fear of Current or Ex-Partner:   . Emotionally Abused:   Marland Kitchen Physically Abused:   . Sexually Abused:     FAMILY HISTORY: Family History  Problem Relation Age of Onset  . Breast cancer Other 36  . Prostate cancer Father   . Heart attack Father   . Ovarian cancer Sister   . Kidney cancer Brother   . Lung cancer Sister   . Kidney disease Mother     ALLERGIES:  is allergic to ciprofloxacin, tramadol, and penicillins.  MEDICATIONS:  Current Outpatient Medications  Medication Sig Dispense Refill  . acetaminophen (TYLENOL) 500 MG tablet Take 1,000 mg by mouth every 8 (eight) hours as needed.     Marland Kitchen apixaban (ELIQUIS) 2.5 MG TABS tablet Take 1 tablet (2.5 mg total)  by mouth 2 (two) times daily. 60 tablet 5  . Calcium Carb-Cholecalciferol (CALCIUM 600+D3 PO) Take 2 tablets by mouth daily.    . carbonyl iron (FEOSOL) 45 MG TABS tablet Take 45 mg by mouth daily. In the morning.    . fluticasone (FLONASE) 50 MCG/ACT nasal spray Place into the nose.    . folic acid (FOLVITE) 1 MG tablet Take 2 tablets by mouth 1 day or 1 dose.  11  . golimumab (SIMPONI ARIA) 50 MG/4ML SOLN injection Inject into the vein every 8 (eight) weeks. Infusion given at [facility]    . hydroxychloroquine (PLAQUENIL) 200 MG tablet TAKE 1 TABLET BY MOUTH EVERY DAY    . metoprolol tartrate (LOPRESSOR) 25 MG tablet TAKE 1/2 TABLET (12.5 MG TOTAL) BY MOUTH 2 (TWO) TIMES DAILY.  11  . ondansetron (ZOFRAN) 4 MG tablet Take 1 tablet (4 mg total) by mouth every 6 (six) hours as needed for nausea. 20 tablet 0  . oxyCODONE (OXY IR/ROXICODONE) 5 MG immediate release tablet Take 1-2 tablets (5-10 mg total) by mouth every 4 (four) hours as needed for moderate pain (pain score 4-6). 30 tablet 0  . ferrous sulfate 325 (65 FE) MG EC tablet Take 1 tablet (325 mg total) by mouth daily. (Patient not taking: Reported on 12/04/2019) 60 tablet 3   No current facility-administered medications for this visit.     PHYSICAL EXAMINATION: ECOG PERFORMANCE STATUS: 1 - Symptomatic but completely ambulatory Vitals:   06/02/20 1017  BP: (!) 141/83  Pulse: 65  Resp: 18  Temp: (!) 97.5 F (36.4 C)   Filed Weights   06/02/20 1017  Weight: 239 lb 14.4 oz (108.8 kg)    Physical Exam Constitutional:      General: She is not in acute distress.    Appearance: She is not diaphoretic.  HENT:     Head: Normocephalic and atraumatic.     Nose: Nose normal.     Mouth/Throat:     Pharynx: No oropharyngeal exudate.  Eyes:     General: No scleral icterus.       Left eye: No discharge.     Conjunctiva/sclera: Conjunctivae normal.     Pupils: Pupils are equal, round, and reactive to light.  Neck:     Vascular:  No JVD.  Cardiovascular:     Rate and Rhythm: Normal rate and regular rhythm.     Heart sounds: Normal heart sounds. No murmur heard.  No friction rub.  Pulmonary:     Effort: Pulmonary effort is normal. No respiratory distress.     Breath sounds: Normal breath  sounds. No rales.  Chest:     Chest wall: No tenderness.  Abdominal:     General: Bowel sounds are normal. There is no distension.     Palpations: Abdomen is soft. There is no mass.     Tenderness: There is no abdominal tenderness.     Comments:  Ventral  hernia  Musculoskeletal:        General: Normal range of motion.     Cervical back: Normal range of motion and neck supple.     Comments: Trace edema of left lower extremity  Lymphadenopathy:     Cervical: No cervical adenopathy.  Skin:    General: Skin is warm and dry.     Findings: No erythema.  Neurological:     Mental Status: She is alert and oriented to person, place, and time.     Cranial Nerves: No cranial nerve deficit.     Motor: No abnormal muscle tone.     Coordination: Coordination normal.     Deep Tendon Reflexes: Reflexes normal.  Psychiatric:        Mood and Affect: Affect normal.      LABORATORY DATA:  I have reviewed the data as listed Lab Results  Component Value Date   WBC 5.2 06/02/2020   HGB 13.4 06/02/2020   HCT 39.7 06/02/2020   MCV 90.0 06/02/2020   PLT 187 06/02/2020   Recent Labs    06/08/19 1000 06/08/19 1000 12/03/19 1046 01/18/20 0453 06/02/20 0946  NA 138   < > 138 136 138  K 4.8   < > 4.6 4.7 4.3  CL 104   < > 103 105 103  CO2 25   < > 25 23 26   GLUCOSE 86   < > 103* 157* 103*  BUN 16   < > 17 16 14   CREATININE 1.09*   < > 1.12* 1.15* 1.10*  CALCIUM 8.9   < > 8.9 8.6* 8.8*  GFRNONAA 51*   < > 49* 47* 50*  GFRAA 59*   < > 57* 55* 58*  PROT 7.8  --  7.9  --  7.9  ALBUMIN 3.7  --  3.8  --  3.8  AST 20  --  20  --  20  ALT 13  --  14  --  14  ALKPHOS 95  --  90  --  92  BILITOT 0.5  --  0.5  --  0.5   < > =  values in this interval not displayed.    RADIOGRAPHIC STUDIES: I have personally reviewed the radiological images as listed and agreed with the findings in the report. US venous lower extremity on 10/29/2016, 02/25/2017, 10/27/2017.   ASSESSMENT & PLAN:  1. Chronic deep vein thrombosis (DVT) of proximal vein of left lower extremity (HCC)   2. Prothrombin gene mutation (HCC)   3. Anemia of chronic disease    #Chronic anticoagulation for history of recurrent DVT:  Labs reviewed and discussed with patient.  Hemoglobin is stable at 13.4. Iron panel results were available after patient's visit. Stable iron stores.  Previously elevated iron saturation has decreased to 38.  Still slightly high. Recommend patient to continue chronic anticoagulation with Eliquis 2.5 mg twice daily.  #Heterozygous prothrombin gene mutation, chronic low-dose anticoagulation.  #Anermia has normalized.  Recommend patient to continue follow-up with primary care provider.  I will discharge her from my clinic.  The patient knows to call the clinic with any problems questions or concerns.  Return of visit: 6 months  Rickard Patience, MD, PhD Hematology Oncology [facility] Pager- [phone] 06/02/2020

## 2020-06-02 NOTE — Progress Notes (Signed)
Patient here for follow up. No new concerns voiced.  °

## 2020-06-03 ENCOUNTER — Telehealth: Payer: Self-pay

## 2020-06-03 NOTE — Telephone Encounter (Signed)
Patient notified

## 2020-06-03 NOTE — Telephone Encounter (Signed)
-----   Message from Rickard Patience, MD sent at 06/02/2020  8:54 PM EDT ----- Please let her know that iron is sufficient, no need to take oral iron. Thanks.

## 2020-06-07 ENCOUNTER — Other Ambulatory Visit: Payer: Self-pay | Admitting: Oncology

## 2020-07-25 ENCOUNTER — Other Ambulatory Visit: Payer: Self-pay | Admitting: Infectious Diseases

## 2020-07-25 DIAGNOSIS — Z1231 Encounter for screening mammogram for malignant neoplasm of breast: Secondary | ICD-10-CM

## 2020-09-01 ENCOUNTER — Ambulatory Visit: Payer: Medicare Other

## 2020-09-01 ENCOUNTER — Other Ambulatory Visit: Payer: Self-pay

## 2020-09-01 ENCOUNTER — Ambulatory Visit
Admission: RE | Admit: 2020-09-01 | Discharge: 2020-09-01 | Disposition: A | Discharge status: Home / Self Care | Payer: Medicare Other | Source: Ambulatory Visit | Attending: Infectious Diseases | Admitting: Infectious Diseases

## 2020-09-01 DIAGNOSIS — Z1231 Encounter for screening mammogram for malignant neoplasm of breast: Secondary | ICD-10-CM | POA: Diagnosis present

## 2020-09-29 ENCOUNTER — Other Ambulatory Visit: Payer: Self-pay | Admitting: *Deleted

## 2020-09-29 DIAGNOSIS — Z122 Encounter for screening for malignant neoplasm of respiratory organs: Secondary | ICD-10-CM

## 2020-09-29 DIAGNOSIS — Z87891 Personal history of nicotine dependence: Secondary | ICD-10-CM

## 2020-09-29 NOTE — Progress Notes (Signed)
Contacted and scheduled for lung screening scan. Former smoker, quit 9/17, 31.25 pack year

## 2020-10-02 ENCOUNTER — Ambulatory Visit
Admission: RE | Admit: 2020-10-02 | Discharge: 2020-10-02 | Disposition: A | Discharge status: Home / Self Care | Payer: Medicare Other | Source: Ambulatory Visit | Attending: Nurse Practitioner | Admitting: Nurse Practitioner

## 2020-10-02 ENCOUNTER — Other Ambulatory Visit: Payer: Self-pay

## 2020-10-02 DIAGNOSIS — Z122 Encounter for screening for malignant neoplasm of respiratory organs: Secondary | ICD-10-CM | POA: Diagnosis present

## 2020-10-02 DIAGNOSIS — Z87891 Personal history of nicotine dependence: Secondary | ICD-10-CM | POA: Insufficient documentation

## 2020-10-04 ENCOUNTER — Encounter: Payer: Self-pay | Admitting: *Deleted

## 2020-11-30 ENCOUNTER — Other Ambulatory Visit: Payer: Self-pay | Admitting: Oncology

## 2020-12-01 ENCOUNTER — Other Ambulatory Visit: Payer: Self-pay | Admitting: Oncology

## 2020-12-01 NOTE — Telephone Encounter (Signed)
Per last MD note on 06/02/20:  Recommend patient to continue chronic anticoagulation with Eliquis 2.5 mg twice daily.    #Anermia has normalized.  Recommend patient to continue follow-up with primary care provider.  I will discharge her from my clinic.    Return of visit: 6 months   Last check out note was : no follow up needed. Did you want her to follow up with PCP for eliquis refill or did you to continue following pt?

## 2020-12-01 NOTE — Telephone Encounter (Signed)
Ok to refill 1 month supply. She needs follow up with me.

## 2020-12-01 NOTE — Telephone Encounter (Signed)
I see. She is probably following up with PCP now. Please ask her to get eliquis refills from PCP.

## 2020-12-01 NOTE — Telephone Encounter (Signed)
Called pt and she states that Dr. Sampson Goon is taking care of her Eliquis, no need for Dr. Cathie Hoops to worry.

## 2021-02-02 ENCOUNTER — Other Ambulatory Visit: Payer: Self-pay

## 2021-02-02 ENCOUNTER — Encounter: Payer: Self-pay | Admitting: Ophthalmology

## 2021-02-04 NOTE — Discharge Instructions (Signed)

## 2021-02-05 ENCOUNTER — Other Ambulatory Visit: Payer: Self-pay

## 2021-02-05 ENCOUNTER — Other Ambulatory Visit
Admission: RE | Admit: 2021-02-05 | Discharge: 2021-02-05 | Disposition: A | Discharge status: Home / Self Care | Payer: Medicare Other | Source: Ambulatory Visit | Attending: Ophthalmology | Admitting: Ophthalmology

## 2021-02-05 DIAGNOSIS — Z20822 Contact with and (suspected) exposure to covid-19: Secondary | ICD-10-CM | POA: Insufficient documentation

## 2021-02-05 DIAGNOSIS — Z01812 Encounter for preprocedural laboratory examination: Secondary | ICD-10-CM | POA: Insufficient documentation

## 2021-02-05 LAB — SARS CORONAVIRUS 2 (TAT 6-24 HRS): SARS Coronavirus 2: NEGATIVE

## 2021-02-09 ENCOUNTER — Other Ambulatory Visit: Payer: Self-pay

## 2021-02-09 ENCOUNTER — Ambulatory Visit
Admission: RE | Admit: 2021-02-09 | Discharge: 2021-02-09 | Disposition: A | Discharge status: Home / Self Care | Payer: Medicare Other | Attending: Ophthalmology | Admitting: Ophthalmology

## 2021-02-09 ENCOUNTER — Encounter: Payer: Self-pay | Admitting: Ophthalmology

## 2021-02-09 ENCOUNTER — Ambulatory Visit: Payer: Medicare Other | Admitting: Anesthesiology

## 2021-02-09 ENCOUNTER — Encounter
Admission: RE | Disposition: A | Discharge status: Home / Self Care | Payer: Self-pay | Source: Home / Self Care | Attending: Ophthalmology

## 2021-02-09 DIAGNOSIS — Z79899 Other long term (current) drug therapy: Secondary | ICD-10-CM | POA: Insufficient documentation

## 2021-02-09 DIAGNOSIS — Z86718 Personal history of other venous thrombosis and embolism: Secondary | ICD-10-CM | POA: Diagnosis not present

## 2021-02-09 DIAGNOSIS — Z801 Family history of malignant neoplasm of trachea, bronchus and lung: Secondary | ICD-10-CM | POA: Diagnosis not present

## 2021-02-09 DIAGNOSIS — Z7901 Long term (current) use of anticoagulants: Secondary | ICD-10-CM | POA: Diagnosis not present

## 2021-02-09 DIAGNOSIS — Z803 Family history of malignant neoplasm of breast: Secondary | ICD-10-CM | POA: Insufficient documentation

## 2021-02-09 DIAGNOSIS — Z885 Allergy status to narcotic agent status: Secondary | ICD-10-CM | POA: Diagnosis not present

## 2021-02-09 DIAGNOSIS — Z87891 Personal history of nicotine dependence: Secondary | ICD-10-CM | POA: Insufficient documentation

## 2021-02-09 DIAGNOSIS — Z8249 Family history of ischemic heart disease and other diseases of the circulatory system: Secondary | ICD-10-CM | POA: Insufficient documentation

## 2021-02-09 DIAGNOSIS — Z8042 Family history of malignant neoplasm of prostate: Secondary | ICD-10-CM | POA: Insufficient documentation

## 2021-02-09 DIAGNOSIS — Z881 Allergy status to other antibiotic agents status: Secondary | ICD-10-CM | POA: Diagnosis not present

## 2021-02-09 DIAGNOSIS — H2511 Age-related nuclear cataract, right eye: Secondary | ICD-10-CM | POA: Insufficient documentation

## 2021-02-09 DIAGNOSIS — Z880 Allergy status to penicillin: Secondary | ICD-10-CM | POA: Insufficient documentation

## 2021-02-09 DIAGNOSIS — Z8051 Family history of malignant neoplasm of kidney: Secondary | ICD-10-CM | POA: Diagnosis not present

## 2021-02-09 HISTORY — PX: CATARACT EXTRACTION W/PHACO: SHX586

## 2021-02-09 HISTORY — DX: Presence of dental prosthetic device (complete) (partial): Z97.2

## 2021-02-09 HISTORY — DX: Tachycardia, unspecified: R00.0

## 2021-02-09 SURGERY — PHACOEMULSIFICATION, CATARACT, WITH IOL INSERTION
Anesthesia: Monitor Anesthesia Care | Site: Eye | Laterality: Right

## 2021-02-09 MED ORDER — LIDOCAINE HCL (PF) 2 % IJ SOLN
INTRAOCULAR | Status: DC | PRN
  Administered 2021-02-09: 1 mL via INTRAOCULAR

## 2021-02-09 MED ORDER — FENTANYL CITRATE (PF) 100 MCG/2ML IJ SOLN
INTRAMUSCULAR | Status: DC | PRN
  Administered 2021-02-09: 50 ug via INTRAVENOUS

## 2021-02-09 MED ORDER — SODIUM HYALURONATE 23 MG/ML IO SOLN
INTRAOCULAR | Status: DC | PRN
  Administered 2021-02-09: 0.6 mL via INTRAOCULAR

## 2021-02-09 MED ORDER — LACTATED RINGERS IV SOLN: INTRAVENOUS | Status: DC

## 2021-02-09 MED ORDER — ACETAMINOPHEN 160 MG/5ML PO SOLN: 325.0000 mg | Freq: Once | ORAL | Status: DC

## 2021-02-09 MED ORDER — CYCLOPENTOLATE HCL 2 % OP SOLN
1.0000 [drp] | OPHTHALMIC | Status: DC | PRN
  Administered 2021-02-09 (×3): 1 [drp] via OPHTHALMIC

## 2021-02-09 MED ORDER — TETRACAINE HCL 0.5 % OP SOLN
1.0000 [drp] | OPHTHALMIC | Status: DC | PRN
  Administered 2021-02-09 (×3): 1 [drp] via OPHTHALMIC

## 2021-02-09 MED ORDER — ACETAMINOPHEN 325 MG PO TABS: 325.0000 mg | ORAL_TABLET | Freq: Once | ORAL | Status: DC

## 2021-02-09 MED ORDER — SODIUM HYALURONATE 10 MG/ML IO SOLN
INTRAOCULAR | Status: DC | PRN
  Administered 2021-02-09: 0.55 mL via INTRAOCULAR

## 2021-02-09 MED ORDER — MIDAZOLAM HCL 2 MG/2ML IJ SOLN
INTRAMUSCULAR | Status: DC | PRN
  Administered 2021-02-09: 1 mg via INTRAVENOUS

## 2021-02-09 MED ORDER — PHENYLEPHRINE HCL 10 % OP SOLN
1.0000 [drp] | OPHTHALMIC | Status: DC | PRN
  Administered 2021-02-09 (×3): 1 [drp] via OPHTHALMIC

## 2021-02-09 MED ORDER — EPINEPHRINE PF 1 MG/ML IJ SOLN
INTRAOCULAR | Status: DC | PRN
  Administered 2021-02-09: 87 mL via OPHTHALMIC

## 2021-02-09 MED ORDER — POLYMYXIN B-TRIMETHOPRIM 10000-0.1 UNIT/ML-% OP SOLN
OPHTHALMIC | Status: DC | PRN
  Administered 2021-02-09: 2 [drp] via OPHTHALMIC

## 2021-02-09 SURGICAL SUPPLY — 18 items
CANNULA ANT/CHMB 27G (MISCELLANEOUS) ×2 IMPLANT
CANNULA ANT/CHMB 27GA (MISCELLANEOUS) ×4 IMPLANT
DISSECTOR HYDRO NUCLEUS 50X22 (MISCELLANEOUS) ×2 IMPLANT
GLOVE SURG ENC MOIS LTX SZ7.5 (GLOVE) ×2 IMPLANT
GLOVE SURG SYN 8.5  E (GLOVE) ×2
GLOVE SURG SYN 8.5 E (GLOVE) ×1 IMPLANT
GLOVE SURG SYN 8.5 PF PI (GLOVE) ×1 IMPLANT
GOWN STRL REUS W/ TWL LRG LVL3 (GOWN DISPOSABLE) ×2 IMPLANT
GOWN STRL REUS W/TWL LRG LVL3 (GOWN DISPOSABLE) ×4
LENS IOL TECNIS EYHANCE 20.5 (Intraocular Lens) ×1 IMPLANT
MARKER SKIN DUAL TIP RULER LAB (MISCELLANEOUS) ×2 IMPLANT
PACK DR. KING ARMS (PACKS) ×2 IMPLANT
PACK EYE AFTER SURG (MISCELLANEOUS) ×2 IMPLANT
PACK OPTHALMIC (MISCELLANEOUS) ×2 IMPLANT
SYR 3ML LL SCALE MARK (SYRINGE) ×2 IMPLANT
SYR TB 1ML LUER SLIP (SYRINGE) ×2 IMPLANT
WATER STERILE IRR 250ML POUR (IV SOLUTION) ×2 IMPLANT
WIPE NON LINTING 3.25X3.25 (MISCELLANEOUS) ×2 IMPLANT

## 2021-02-09 NOTE — Anesthesia Postprocedure Evaluation (Signed)
Anesthesia Post Note  Patient: Ruth Gray  Procedure(s) Performed: CATARACT EXTRACTION PHACO AND INTRAOCULAR LENS PLACEMENT (IOC) RIGHT (Right Eye)     Patient location during evaluation: PACU Anesthesia Type: MAC Level of consciousness: awake and alert and oriented Pain management: satisfactory to patient Vital Signs Assessment: post-procedure vital signs reviewed and stable Respiratory status: spontaneous breathing, nonlabored ventilation and respiratory function stable Cardiovascular status: blood pressure returned to baseline and stable Postop Assessment: Adequate PO intake and No signs of nausea or vomiting Anesthetic complications: no   No complications documented.  Raliegh Ip

## 2021-02-09 NOTE — Op Note (Signed)
OPERATIVE NOTE  Ruth Gray [id number] 02/09/2021   PREOPERATIVE DIAGNOSIS:  Nuclear sclerotic cataract right eye.  H25.11   POSTOPERATIVE DIAGNOSIS:    Nuclear sclerotic cataract right eye.     PROCEDURE:  Phacoemusification with posterior chamber intraocular lens placement of the right eye   LENS:   Implant Name Type Inv. Item Serial No. Manufacturer Lot No. LRB No. Used Action  LENS IOL TECNIS EYHANCE 20.5 - [id number] Intraocular Lens LENS IOL TECNIS EYHANCE 20.5 [id number] JOHNSON   Right 1 Implanted       Procedure(s) with comments: CATARACT EXTRACTION PHACO AND INTRAOCULAR LENS PLACEMENT (IOC) RIGHT (Right) - 2.27 0:22.1  DIB00 +20.5   ULTRASOUND TIME: 0 minutes 22 seconds.  CDE 2.27   SURGEON:  Willey Blade, MD, MPH  ANESTHESIOLOGIST: Anesthesiologist: Ranee Gosselin, MD CRNA: Michaele Offer, CRNA   ANESTHESIA:  Topical with tetracaine drops augmented with 1% preservative-free intracameral lidocaine.  ESTIMATED BLOOD LOSS: less than 1 mL.   COMPLICATIONS:  None.   DESCRIPTION OF PROCEDURE:  The patient was identified in the holding room and transported to the operating room and placed in the supine position under the operating microscope.  The right eye was identified as the operative eye and it was prepped and draped in the usual sterile ophthalmic fashion.   A 1.0 millimeter clear-corneal paracentesis was made at the 10:30 position. 0.5 ml of preservative-free 1% lidocaine with epinephrine was injected into the anterior chamber.  The anterior chamber was filled with Healon 5 viscoelastic.  A 2.4 millimeter keratome was used to make a near-clear corneal incision at the 8:00 position.  A curvilinear capsulorrhexis was made with a cystotome and capsulorrhexis forceps.  Balanced salt solution was used to hydrodissect and hydrodelineate the nucleus.   Phacoemulsification was then used in stop and chop fashion to remove the lens nucleus and epinucleus.  The remaining  cortex was then removed using the irrigation and aspiration handpiece. Healon was then placed into the capsular bag to distend it for lens placement.  A lens was then injected into the capsular bag.  The remaining viscoelastic was aspirated.   Wounds were hydrated with balanced salt solution.  The anterior chamber was inflated to a physiologic pressure with balanced salt solution.   Intracameral vigamox 0.1 mL undiluted was injected into the eye and a drop placed onto the ocular surface.  No wound leaks were noted.  The patient was taken to the recovery room in stable condition without complications of anesthesia or surgery  Willey Blade 02/09/2021, 10:58 AM

## 2021-02-09 NOTE — Anesthesia Procedure Notes (Signed)
Procedure Name: MAC Date/Time: 02/09/2021 10:39 AM Performed by: Silvana Newness, CRNA Pre-anesthesia Checklist: Patient identified, Emergency Drugs available, Suction available, Patient being monitored and Timeout performed Patient Re-evaluated:Patient Re-evaluated prior to induction Oxygen Delivery Method: Nasal cannula Placement Confirmation: positive ETCO2

## 2021-02-09 NOTE — Transfer of Care (Signed)
Immediate Anesthesia Transfer of Care Note  Patient: Ruth Gray  Procedure(s) Performed: CATARACT EXTRACTION PHACO AND INTRAOCULAR LENS PLACEMENT (IOC) RIGHT (Right Eye)  Patient Location: PACU  Anesthesia Type: MAC  Level of Consciousness: awake, alert  and patient cooperative  Airway and Oxygen Therapy: Patient Spontanous Breathing and Patient connected to supplemental oxygen  Post-op Assessment: Post-op Vital signs reviewed, Patient's Cardiovascular Status Stable, Respiratory Function Stable, Patent Airway and No signs of Nausea or vomiting  Post-op Vital Signs: Reviewed and stable  Complications: No complications documented.

## 2021-02-09 NOTE — H&P (Signed)
[facility]   Primary Care Physician:  Mick Sell, MD Ophthalmologist: Dr. Willey Blade  Pre-Procedure History & Physical: HPI:  Ruth Gray is a 75 y.o. adult here for cataract surgery.   Past Medical History:  Diagnosis Date  . Anemia   . Collagen vascular disease (HCC)   . DVT (deep venous thrombosis) (HCC) 10/2016   left leg  . Perforation of colon (HCC) 08/2016  . Rheumatoid arteritis (HCC)   . Tachycardia   . Wears dentures    full upper    Past Surgical History:  Procedure Laterality Date  . ABDOMINAL HYSTERECTOMY    . BACK SURGERY  2005   slipped disc lower back, [place]  . CENTRAL VENOUS CATHETER INSERTION  09/21/2016   Procedure: INSERTION CENTRAL LINE ADULT;  Surgeon: Tiney Rouge III, MD;  Location: [facility];  Service: General;;  . COLON RESECTION  09/21/2016   Procedure: COLON RESECTION- Ascending and Sigmoid;  Surgeon: Tiney Rouge III, MD;  Location: [facility];  Service: General;;  . COLOSTOMY N/A 09/25/2016   Procedure: COLOSTOMY;  Surgeon: Tiney Rouge III, MD;  Location: [facility];  Service: General;  Laterality: N/A;  . DEBRIDEMENT OF ABDOMINAL WALL ABSCESS N/A 12/03/2016   Procedure: DEBRIDEMENT OF ABDOMINAL WALL ABSCESS;  Surgeon: Leafy Ro, MD;  Location: [facility];  Service: General;  Laterality: N/A;  . FOOT SURGERY Left   . ILEOSTOMY  08/24/2017   Procedure: ILEOSTOMY Loop Creation;  Surgeon: Ricarda Frame, MD;  Location: [facility];  Service: General;;  . ILEOSTOMY CLOSURE N/A 08/24/2017   Procedure: ILEOSTOMY REVERSAL X3;  Surgeon: Ricarda Frame, MD;  Location: [facility];  Service: General;  Laterality: N/A;  . ILEOSTOMY CLOSURE N/A 12/07/2017   Procedure: ILEOSTOMY TAKEDOWN;  Surgeon: Ricarda Frame, MD;  Location: [facility];  Service: General;  Laterality: N/A;  . JOINT REPLACEMENT Left 2012   knee  . LAPAROTOMY N/A 09/21/2016   Procedure: EXPLORATORY LAPAROTOMY;  Surgeon: Tiney Rouge III, MD;  Location: [facility];  Service: General;   Laterality: N/A;  . LAPAROTOMY N/A 09/25/2016   Procedure: EXPLORATORY LAPAROTOMY and right colon resection;  Surgeon: Tiney Rouge III, MD;  Location: [facility];  Service: General;  Laterality: N/A;  . LAPAROTOMY  08/24/2017   Procedure: EXPLORATORY LAPAROTOMY;  Surgeon: Ricarda Frame, MD;  Location: [facility];  Service: General;;  . REPLACEMENT TOTAL KNEE Left   . SHOULDER ARTHROSCOPY Left 01/17/2020   Procedure: ARTHROSCOPY SHOULDER WASH OUT;  Surgeon: Signa Kell, MD;  Location: [facility];  Service: Orthopedics;  Laterality: Left;  . SKIN GRAFT Left    lateral lower calf above ankle    Prior to Admission medications   Medication Sig Start Date End Date Taking? Authorizing Provider  acetaminophen (TYLENOL) 500 MG tablet Take 1,000 mg by mouth every 8 (eight) hours as needed.    Yes [provider]  Calcium Carb-Cholecalciferol (CALCIUM 600+D3 PO) Take 2 tablets by mouth daily.   Yes [provider]  fluticasone (FLONASE) 50 MCG/ACT nasal spray Place into the nose.   Yes [provider]  folic acid (FOLVITE) 1 MG tablet Take 2 tablets by mouth 1 day or 1 dose. 03/19/18  Yes [provider]  golimumab (SIMPONI ARIA) 50 MG/4ML SOLN injection Inject into the vein every 8 (eight) weeks. Infusion given at [facility]   Yes [provider]  hydroxychloroquine (PLAQUENIL) 200 MG tablet TAKE 1 TABLET BY MOUTH EVERY DAY 12/08/18  Yes [provider]  metoprolol tartrate (LOPRESSOR) 25 MG tablet TAKE 1/2 TABLET (12.5 MG TOTAL) BY MOUTH 2 (TWO) TIMES DAILY. 11/29/16  Yes [provider]  rivaroxaban (XARELTO) 20 MG TABS tablet Take 20 mg by mouth daily.   Yes [provider]  Turmeric Curcumin 500 MG CAPS Take by mouth daily.   Yes [provider]  vitamin B-12 (CYANOCOBALAMIN) 1000 MCG tablet Take 1,000 mcg by mouth daily.   Yes [provider]    Allergies as of 12/31/2020 - Review Complete 06/02/2020  Allergen  Reaction Noted  . Ciprofloxacin Swelling and Other (See Comments) 03/07/2014  . Tramadol Nausea And Vomiting 06/08/2016  . Penicillins Swelling and Rash 05/29/2015    Family History  Problem Relation Age of Onset  . Breast cancer Other 36  . Prostate cancer Father   . Heart attack Father   . Ovarian cancer Sister   . Kidney cancer Brother   . Lung cancer Sister   . Kidney disease Mother     Social History   Socioeconomic History  . Marital status: Widowed    Spouse name: Not on file  . Number of children: Not on file  . Years of education: Not on file  . Highest education level: Not on file  Occupational History  . Not on file  Tobacco Use  . Smoking status: Former Smoker    Packs/day: 0.50    Years: 35.00    Pack years: 17.50    Types: Cigarettes    Quit date: 07/15/2016    Years since quitting: 4.5  . Smokeless tobacco: Never Used  Vaping Use  . Vaping Use: Never used  Substance and Sexual Activity  . Alcohol use: No  . Drug use: No  . Sexual activity: Not Currently  Other Topics Concern  . Not on file  Social History Narrative  . Not on file   Social Determinants of Health   Financial Resource Strain: Not on file  Food Insecurity: Not on file  Transportation Needs: Not on file  Physical Activity: Not on file  Stress: Not on file  Social Connections: Not on file  Intimate Partner Violence: Not on file    Review of Systems: See HPI, otherwise negative ROS  Physical Exam: BP 129/84   Pulse 99   Temp 97.8 F (36.6 C) (Temporal)   Resp 18   Ht 5\' 7"  (1.702 m)   Wt 108.9 kg   SpO2 98%   BMI 37.59 kg/m  General:   Alert,  pleasant and cooperative in NAD Head:  Normocephalic and atraumatic. Respiratory:  Normal work of breathing.  Impression/Plan: Ruth Gray is here for cataract surgery.  Risks, benefits, limitations, and alternatives regarding cataract surgery have been reviewed with the patient.  Questions have been answered.  All parties  agreeable.   Kinnie Feil, MD  02/09/2021, 10:29 AM

## 2021-02-09 NOTE — Anesthesia Preprocedure Evaluation (Signed)
Anesthesia Evaluation  Patient identified by MRN, date of birth, ID band Patient awake    Reviewed: Allergy & Precautions, H&P , NPO status , Patient's Chart, lab work & pertinent test results  Airway Mallampati: II  TM Distance: >3 FB Neck ROM: full    Dental no notable dental hx. (+) Upper Dentures   Pulmonary former smoker,    Pulmonary exam normal breath sounds clear to auscultation       Cardiovascular + CAD  Normal cardiovascular exam Rhythm:regular Rate:Normal     Neuro/Psych    GI/Hepatic   Endo/Other  Morbid obesity  Renal/GU      Musculoskeletal   Abdominal   Peds  Hematology  (+) Blood dyscrasia, ,   Anesthesia Other Findings   Reproductive/Obstetrics                             Anesthesia Physical Anesthesia Plan  ASA: III  Anesthesia Plan: MAC   Post-op Pain Management:    Induction:   PONV Risk Score and Plan: 2 and Treatment may vary due to age or medical condition, TIVA and Midazolam  Airway Management Planned:   Additional Equipment:   Intra-op Plan:   Post-operative Plan:   Informed Consent: I have reviewed the patients History and Physical, chart, labs and discussed the procedure including the risks, benefits and alternatives for the proposed anesthesia with the patient or authorized representative who has indicated his/her understanding and acceptance.     Dental Advisory Given  Plan Discussed with: CRNA  Anesthesia Plan Comments:         Anesthesia Quick Evaluation

## 2021-02-10 ENCOUNTER — Encounter: Payer: Self-pay | Admitting: Ophthalmology

## 2021-02-26 ENCOUNTER — Encounter: Payer: Self-pay | Admitting: Ophthalmology

## 2021-03-05 ENCOUNTER — Inpatient Hospital Stay: Admission: RE | Admit: 2021-03-05 | Payer: Medicare Other | Source: Ambulatory Visit

## 2021-03-05 NOTE — Discharge Instructions (Signed)

## 2021-03-09 ENCOUNTER — Encounter
Admission: RE | Disposition: A | Discharge status: Home / Self Care | Payer: Self-pay | Source: Home / Self Care | Attending: Ophthalmology

## 2021-03-09 ENCOUNTER — Other Ambulatory Visit: Payer: Self-pay

## 2021-03-09 ENCOUNTER — Ambulatory Visit: Payer: Medicare Other | Admitting: Anesthesiology

## 2021-03-09 ENCOUNTER — Ambulatory Visit
Admission: RE | Admit: 2021-03-09 | Discharge: 2021-03-09 | Disposition: A | Discharge status: Home / Self Care | Payer: Medicare Other | Attending: Ophthalmology | Admitting: Ophthalmology

## 2021-03-09 ENCOUNTER — Encounter: Payer: Self-pay | Admitting: Ophthalmology

## 2021-03-09 DIAGNOSIS — H2512 Age-related nuclear cataract, left eye: Secondary | ICD-10-CM | POA: Insufficient documentation

## 2021-03-09 DIAGNOSIS — Z87891 Personal history of nicotine dependence: Secondary | ICD-10-CM | POA: Diagnosis not present

## 2021-03-09 DIAGNOSIS — Z9841 Cataract extraction status, right eye: Secondary | ICD-10-CM | POA: Diagnosis not present

## 2021-03-09 DIAGNOSIS — Z79899 Other long term (current) drug therapy: Secondary | ICD-10-CM | POA: Insufficient documentation

## 2021-03-09 DIAGNOSIS — Z96652 Presence of left artificial knee joint: Secondary | ICD-10-CM | POA: Diagnosis not present

## 2021-03-09 DIAGNOSIS — Z881 Allergy status to other antibiotic agents status: Secondary | ICD-10-CM | POA: Insufficient documentation

## 2021-03-09 DIAGNOSIS — Z885 Allergy status to narcotic agent status: Secondary | ICD-10-CM | POA: Diagnosis not present

## 2021-03-09 DIAGNOSIS — Z7901 Long term (current) use of anticoagulants: Secondary | ICD-10-CM | POA: Insufficient documentation

## 2021-03-09 DIAGNOSIS — Z880 Allergy status to penicillin: Secondary | ICD-10-CM | POA: Insufficient documentation

## 2021-03-09 DIAGNOSIS — Z961 Presence of intraocular lens: Secondary | ICD-10-CM | POA: Diagnosis not present

## 2021-03-09 HISTORY — PX: CATARACT EXTRACTION W/PHACO: SHX586

## 2021-03-09 SURGERY — PHACOEMULSIFICATION, CATARACT, WITH IOL INSERTION
Anesthesia: Monitor Anesthesia Care | Site: Eye | Laterality: Left

## 2021-03-09 MED ORDER — SODIUM HYALURONATE 10 MG/ML IO SOLN
INTRAOCULAR | Status: DC | PRN
  Administered 2021-03-09: 0.55 mL via INTRAOCULAR

## 2021-03-09 MED ORDER — POLYMYXIN B-TRIMETHOPRIM 10000-0.1 UNIT/ML-% OP SOLN
OPHTHALMIC | Status: DC | PRN
  Administered 2021-03-09: 1 [drp] via OPHTHALMIC

## 2021-03-09 MED ORDER — FENTANYL CITRATE (PF) 100 MCG/2ML IJ SOLN
INTRAMUSCULAR | Status: DC | PRN
  Administered 2021-03-09: 50 ug via INTRAVENOUS

## 2021-03-09 MED ORDER — LACTATED RINGERS IV SOLN: INTRAVENOUS | Status: DC

## 2021-03-09 MED ORDER — SODIUM HYALURONATE 23 MG/ML IO SOLN
INTRAOCULAR | Status: DC | PRN
  Administered 2021-03-09: 0.6 mL via INTRAOCULAR

## 2021-03-09 MED ORDER — EPINEPHRINE PF 1 MG/ML IJ SOLN
INTRAOCULAR | Status: DC | PRN
  Administered 2021-03-09: 77 mL via OPHTHALMIC

## 2021-03-09 MED ORDER — LIDOCAINE HCL (PF) 2 % IJ SOLN
INTRAOCULAR | Status: DC | PRN
  Administered 2021-03-09: 1 mL via INTRAOCULAR

## 2021-03-09 MED ORDER — TETRACAINE HCL 0.5 % OP SOLN
1.0000 [drp] | OPHTHALMIC | Status: DC | PRN
  Administered 2021-03-09 (×3): 1 [drp] via OPHTHALMIC

## 2021-03-09 MED ORDER — ARMC OPHTHALMIC DILATING DROPS
1.0000 "application " | OPHTHALMIC | Status: DC | PRN
  Administered 2021-03-09 (×3): 1 via OPHTHALMIC

## 2021-03-09 MED ORDER — MIDAZOLAM HCL 2 MG/2ML IJ SOLN
INTRAMUSCULAR | Status: DC | PRN
  Administered 2021-03-09: 2 mg via INTRAVENOUS

## 2021-03-09 SURGICAL SUPPLY — 19 items
CANNULA ANT/CHMB 27G (MISCELLANEOUS) ×2 IMPLANT
CANNULA ANT/CHMB 27GA (MISCELLANEOUS) ×4 IMPLANT
DISSECTOR HYDRO NUCLEUS 50X22 (MISCELLANEOUS) ×2 IMPLANT
GLOVE PI ULTRA LF STRL 7.5 (GLOVE) ×1 IMPLANT
GLOVE PI ULTRA NON LATEX 7.5 (GLOVE) ×2
GLOVE SURG SYN 8.5  E (GLOVE) ×2
GLOVE SURG SYN 8.5 E (GLOVE) ×1 IMPLANT
GLOVE SURG SYN 8.5 PF PI (GLOVE) ×1 IMPLANT
GOWN STRL REUS W/ TWL LRG LVL3 (GOWN DISPOSABLE) ×2 IMPLANT
GOWN STRL REUS W/TWL LRG LVL3 (GOWN DISPOSABLE) ×4
LENS IOL TECNIS EYHANCE 22.0 (Intraocular Lens) ×1 IMPLANT
MARKER SKIN DUAL TIP RULER LAB (MISCELLANEOUS) ×2 IMPLANT
PACK DR. KING ARMS (PACKS) ×2 IMPLANT
PACK EYE AFTER SURG (MISCELLANEOUS) ×2 IMPLANT
PACK OPTHALMIC (MISCELLANEOUS) ×2 IMPLANT
SYR 3ML LL SCALE MARK (SYRINGE) ×2 IMPLANT
SYR TB 1ML LUER SLIP (SYRINGE) ×2 IMPLANT
WATER STERILE IRR 250ML POUR (IV SOLUTION) ×2 IMPLANT
WIPE NON LINTING 3.25X3.25 (MISCELLANEOUS) ×2 IMPLANT

## 2021-03-09 NOTE — H&P (Signed)
[facility]   Primary Care Physician:  Mick Sell, MD Ophthalmologist: Dr. Willey Blade  Pre-Procedure History & Physical: HPI:  Ruth Gray is a 75 y.o. adult here for cataract surgery.   Past Medical History:  Diagnosis Date  . Anemia   . Collagen vascular disease (HCC)   . DVT (deep venous thrombosis) (HCC) 10/2016   left leg  . Perforation of colon (HCC) 08/2016  . Rheumatoid arteritis (HCC)   . Tachycardia   . Wears dentures    full upper    Past Surgical History:  Procedure Laterality Date  . ABDOMINAL HYSTERECTOMY    . BACK SURGERY  2005   slipped disc lower back, [place]  . CATARACT EXTRACTION W/PHACO Right 02/09/2021   Procedure: CATARACT EXTRACTION PHACO AND INTRAOCULAR LENS PLACEMENT (IOC) RIGHT;  Surgeon: Nevada Crane, MD;  Location: [facility];  Service: Ophthalmology;  Laterality: Right;  2.27 0:22.1  . CENTRAL VENOUS CATHETER INSERTION  09/21/2016   Procedure: INSERTION CENTRAL LINE ADULT;  Surgeon: Tiney Rouge III, MD;  Location: [facility];  Service: General;;  . COLON RESECTION  09/21/2016   Procedure: COLON RESECTION- Ascending and Sigmoid;  Surgeon: Tiney Rouge III, MD;  Location: [facility];  Service: General;;  . COLOSTOMY N/A 09/25/2016   Procedure: COLOSTOMY;  Surgeon: Tiney Rouge III, MD;  Location: [facility];  Service: General;  Laterality: N/A;  . DEBRIDEMENT OF ABDOMINAL WALL ABSCESS N/A 12/03/2016   Procedure: DEBRIDEMENT OF ABDOMINAL WALL ABSCESS;  Surgeon: Leafy Ro, MD;  Location: [facility];  Service: General;  Laterality: N/A;  . FOOT SURGERY Left   . ILEOSTOMY  08/24/2017   Procedure: ILEOSTOMY Loop Creation;  Surgeon: Ricarda Frame, MD;  Location: [facility];  Service: General;;  . ILEOSTOMY CLOSURE N/A 08/24/2017   Procedure: ILEOSTOMY REVERSAL X3;  Surgeon: Ricarda Frame, MD;  Location: [facility];  Service: General;  Laterality: N/A;  . ILEOSTOMY CLOSURE N/A 12/07/2017   Procedure: ILEOSTOMY TAKEDOWN;  Surgeon:  Ricarda Frame, MD;  Location: [facility];  Service: General;  Laterality: N/A;  . JOINT REPLACEMENT Left 2012   knee  . LAPAROTOMY N/A 09/21/2016   Procedure: EXPLORATORY LAPAROTOMY;  Surgeon: Tiney Rouge III, MD;  Location: [facility];  Service: General;  Laterality: N/A;  . LAPAROTOMY N/A 09/25/2016   Procedure: EXPLORATORY LAPAROTOMY and right colon resection;  Surgeon: Tiney Rouge III, MD;  Location: [facility];  Service: General;  Laterality: N/A;  . LAPAROTOMY  08/24/2017   Procedure: EXPLORATORY LAPAROTOMY;  Surgeon: Ricarda Frame, MD;  Location: [facility];  Service: General;;  . REPLACEMENT TOTAL KNEE Left   . SHOULDER ARTHROSCOPY Left 01/17/2020   Procedure: ARTHROSCOPY SHOULDER WASH OUT;  Surgeon: Signa Kell, MD;  Location: [facility];  Service: Orthopedics;  Laterality: Left;  . SKIN GRAFT Left    lateral lower calf above ankle    Prior to Admission medications   Medication Sig Start Date End Date Taking? Authorizing Provider  acetaminophen (TYLENOL) 500 MG tablet Take 1,000 mg by mouth every 8 (eight) hours as needed.    Yes [provider]  Calcium Carb-Cholecalciferol (CALCIUM 600+D3 PO) Take 2 tablets by mouth daily.   Yes [provider]  fluticasone (FLONASE) 50 MCG/ACT nasal spray Place into the nose.   Yes [provider]  folic acid (FOLVITE) 1 MG tablet Take 2 tablets by mouth 1 day or 1 dose. 03/19/18  Yes [provider]  golimumab (SIMPONI ARIA) 50 MG/4ML SOLN  injection Inject into the vein every 8 (eight) weeks. Infusion given at [facility]   Yes [provider]  hydroxychloroquine (PLAQUENIL) 200 MG tablet TAKE 1 TABLET BY MOUTH EVERY DAY 12/08/18  Yes [provider]  metoprolol tartrate (LOPRESSOR) 25 MG tablet TAKE 1/2 TABLET (12.5 MG TOTAL) BY MOUTH 2 (TWO) TIMES DAILY. 11/29/16  Yes [provider]  rivaroxaban (XARELTO) 20 MG TABS tablet Take 20 mg by mouth daily.   Yes [provider]  Turmeric  Curcumin 500 MG CAPS Take by mouth daily.   Yes [provider]  vitamin B-12 (CYANOCOBALAMIN) 1000 MCG tablet Take 1,000 mcg by mouth daily.   Yes [provider]    Allergies as of 12/31/2020 - Review Complete 06/02/2020  Allergen Reaction Noted  . Ciprofloxacin Swelling and Other (See Comments) 03/07/2014  . Tramadol Nausea And Vomiting 06/08/2016  . Penicillins Swelling and Rash 05/29/2015    Family History  Problem Relation Age of Onset  . Breast cancer Other 36  . Prostate cancer Father   . Heart attack Father   . Ovarian cancer Sister   . Kidney cancer Brother   . Lung cancer Sister   . Kidney disease Mother     Social History   Socioeconomic History  . Marital status: Widowed    Spouse name: Not on file  . Number of children: Not on file  . Years of education: Not on file  . Highest education level: Not on file  Occupational History  . Not on file  Tobacco Use  . Smoking status: Former Smoker    Packs/day: 0.50    Years: 35.00    Pack years: 17.50    Types: Cigarettes    Quit date: 07/15/2016    Years since quitting: 4.6  . Smokeless tobacco: Never Used  Vaping Use  . Vaping Use: Never used  Substance and Sexual Activity  . Alcohol use: No  . Drug use: No  . Sexual activity: Not Currently  Other Topics Concern  . Not on file  Social History Narrative  . Not on file   Social Determinants of Health   Financial Resource Strain: Not on file  Food Insecurity: Not on file  Transportation Needs: Not on file  Physical Activity: Not on file  Stress: Not on file  Social Connections: Not on file  Intimate Partner Violence: Not on file    Review of Systems: See HPI, otherwise negative ROS  Physical Exam: BP (!) 149/80   Pulse 92   Temp (!) 96.4 F (35.8 C) (Temporal)   Resp 16   Ht 5\' 7"  (1.702 m)   Wt 105.2 kg   SpO2 99%   BMI 36.34 kg/m  General:   Alert,  pleasant and cooperative in NAD Head:  Normocephalic and  atraumatic. Respiratory:  Normal work of breathing. Cardiovascular:  RRR  Impression/Plan: Ruth Gray is here for cataract surgery.  Risks, benefits, limitations, and alternatives regarding cataract surgery have been reviewed with the patient.  Questions have been answered.  All parties agreeable.   Kinnie Feil, MD  03/09/2021, 10:21 AM

## 2021-03-09 NOTE — Op Note (Signed)
OPERATIVE NOTE  ELITA DAME [id number] 03/09/2021   PREOPERATIVE DIAGNOSIS:  Nuclear sclerotic cataract left eye.  H25.12   POSTOPERATIVE DIAGNOSIS:    Nuclear sclerotic cataract left eye.     PROCEDURE:  Phacoemusification with posterior chamber intraocular lens placement of the left eye   LENS:   Implant Name Type Inv. Item Serial No. Manufacturer Lot No. LRB No. Used Action  LENS IOL TECNIS EYHANCE 22.0 - [id number] Intraocular Lens LENS IOL TECNIS EYHANCE 22.0 [id number] JOHNSON   Left 1 Implanted      Procedure(s): CATARACT EXTRACTION PHACO AND INTRAOCULAR LENS PLACEMENT (IOC) LEFT 3.33 00:25.6 (Left)  DIB00 +22.0   ULTRASOUND TIME: 0 minutes 25 seconds.  CDE 3.33   SURGEON:  Willey Blade, MD, MPH   ANESTHESIA:  Topical with tetracaine drops augmented with 1% preservative-free intracameral lidocaine.  ESTIMATED BLOOD LOSS: <1 mL   COMPLICATIONS:  None.   DESCRIPTION OF PROCEDURE:  The patient was identified in the holding room and transported to the operating room and placed in the supine position under the operating microscope.  The left eye was identified as the operative eye and it was prepped and draped in the usual sterile ophthalmic fashion.   A 1.0 millimeter clear-corneal paracentesis was made at the 5:00 position. 0.5 ml of preservative-free 1% lidocaine with epinephrine was injected into the anterior chamber.  The anterior chamber was filled with Healon 5 viscoelastic.  A 2.4 millimeter keratome was used to make a near-clear corneal incision at the 2:00 position.  A curvilinear capsulorrhexis was made with a cystotome and capsulorrhexis forceps.  Balanced salt solution was used to hydrodissect and hydrodelineate the nucleus.   Phacoemulsification was then used in stop and chop fashion to remove the lens nucleus and epinucleus.  The remaining cortex was then removed using the irrigation and aspiration handpiece. Healon was then placed into the capsular bag to  distend it for lens placement.  A lens was then injected into the capsular bag.  The remaining viscoelastic was aspirated.   Wounds were hydrated with balanced salt solution.  The anterior chamber was inflated to a physiologic pressure with balanced salt solution.  Intracameral vigamox 0.1 mL undiltued was injected into the eye and a drop placed onto the ocular surface.  No wound leaks were noted.  The patient was taken to the recovery room in stable condition without complications of anesthesia or surgery  Willey Blade 03/09/2021, 10:47 AM

## 2021-03-09 NOTE — Transfer of Care (Signed)
Immediate Anesthesia Transfer of Care Note  Patient: Ruth Gray  Procedure(s) Performed: CATARACT EXTRACTION PHACO AND INTRAOCULAR LENS PLACEMENT (IOC) LEFT 3.33 00:25.6 (Left Eye)  Patient Location: PACU  Anesthesia Type: MAC  Level of Consciousness: awake, alert  and patient cooperative  Airway and Oxygen Therapy: Patient Spontanous Breathing and Patient connected to supplemental oxygen  Post-op Assessment: Post-op Vital signs reviewed, Patient's Cardiovascular Status Stable, Respiratory Function Stable, Patent Airway and No signs of Nausea or vomiting  Post-op Vital Signs: Reviewed and stable  Complications: No complications documented.

## 2021-03-09 NOTE — Anesthesia Preprocedure Evaluation (Signed)
Anesthesia Evaluation  Patient identified by MRN, date of birth, ID band Patient awake    Reviewed: Allergy & Precautions, H&P , NPO status , Patient's Chart, lab work & pertinent test results  History of Anesthesia Complications Negative for: history of anesthetic complications  Airway Mallampati: II  TM Distance: >3 FB Neck ROM: full    Dental  (+) Upper Dentures, Lower Dentures   Pulmonary neg pulmonary ROS, former smoker,    Pulmonary exam normal breath sounds clear to auscultation       Cardiovascular Exercise Tolerance: Good + DVT (Chronic deep vein thrombosis (DVT) of proximal vein of left lower extremity )  Normal cardiovascular exam+ dysrhythmias (tachy)  Rhythm:regular Rate:Normal     Neuro/Psych negative neurological ROS  negative psych ROS   GI/Hepatic negative GI ROS, Neg liver ROS, Perforation of colon  08/2016 > hx ileostomy    Endo/Other  Morbid obesity (bmi 37)  Renal/GU negative Renal ROS  negative genitourinary   Musculoskeletal  (+) Arthritis  (RA),   Abdominal   Peds  Hematology  (+) Blood dyscrasia, anemia ,   Anesthesia Other Findings Last xarelto: 4/17;   had ECCE on 02/09/2021  Reproductive/Obstetrics                             Anesthesia Physical  Anesthesia Plan  ASA: III  Anesthesia Plan: MAC   Post-op Pain Management:    Induction:   PONV Risk Score and Plan: 2 and Treatment may vary due to age or medical condition, TIVA and Midazolam  Airway Management Planned:   Additional Equipment:   Intra-op Plan:   Post-operative Plan:   Informed Consent: I have reviewed the patients History and Physical, chart, labs and discussed the procedure including the risks, benefits and alternatives for the proposed anesthesia with the patient or authorized representative who has indicated his/her understanding and acceptance.     Dental Advisory  Given  Plan Discussed with: CRNA  Anesthesia Plan Comments:         Anesthesia Quick Evaluation

## 2021-03-09 NOTE — Anesthesia Postprocedure Evaluation (Signed)
Anesthesia Post Note  Patient: Ruth Gray  Procedure(s) Performed: CATARACT EXTRACTION PHACO AND INTRAOCULAR LENS PLACEMENT (IOC) LEFT 3.33 00:25.6 (Left Eye)     Patient location during evaluation: PACU Anesthesia Type: MAC Level of consciousness: awake and alert Pain management: pain level controlled Vital Signs Assessment: post-procedure vital signs reviewed and stable Respiratory status: spontaneous breathing, nonlabored ventilation, respiratory function stable and patient connected to nasal cannula oxygen Cardiovascular status: stable and blood pressure returned to baseline Postop Assessment: no apparent nausea or vomiting Anesthetic complications: no   No complications documented.  Fidel Levy

## 2021-03-09 NOTE — Anesthesia Procedure Notes (Signed)
Procedure Name: MAC Date/Time: 03/09/2021 10:29 AM Performed by: Silvana Newness, CRNA Pre-anesthesia Checklist: Patient identified, Emergency Drugs available, Suction available, Patient being monitored and Timeout performed Patient Re-evaluated:Patient Re-evaluated prior to induction Oxygen Delivery Method: Nasal cannula Placement Confirmation: positive ETCO2

## 2021-03-10 ENCOUNTER — Encounter: Payer: Self-pay | Admitting: Ophthalmology

## 2021-08-04 ENCOUNTER — Other Ambulatory Visit: Payer: Self-pay | Admitting: Infectious Diseases

## 2021-08-04 DIAGNOSIS — Z1231 Encounter for screening mammogram for malignant neoplasm of breast: Secondary | ICD-10-CM

## 2021-09-07 ENCOUNTER — Other Ambulatory Visit: Payer: Self-pay

## 2021-09-07 ENCOUNTER — Ambulatory Visit
Admission: RE | Admit: 2021-09-07 | Discharge: 2021-09-07 | Disposition: A | Discharge status: Home / Self Care | Payer: Medicare Other | Source: Ambulatory Visit | Attending: Infectious Diseases | Admitting: Infectious Diseases

## 2021-09-07 DIAGNOSIS — Z1231 Encounter for screening mammogram for malignant neoplasm of breast: Secondary | ICD-10-CM | POA: Diagnosis not present

## 2021-11-09 ENCOUNTER — Other Ambulatory Visit: Payer: Self-pay | Admitting: *Deleted

## 2021-11-09 DIAGNOSIS — Z87891 Personal history of nicotine dependence: Secondary | ICD-10-CM

## 2021-11-18 ENCOUNTER — Ambulatory Visit
Admission: RE | Admit: 2021-11-18 | Discharge: 2021-11-18 | Disposition: A | Discharge status: Home / Self Care | Payer: Medicare Other | Source: Ambulatory Visit | Attending: Acute Care | Admitting: Acute Care

## 2021-11-18 ENCOUNTER — Other Ambulatory Visit: Payer: Self-pay

## 2021-11-18 DIAGNOSIS — Z87891 Personal history of nicotine dependence: Secondary | ICD-10-CM | POA: Diagnosis present

## 2021-11-19 ENCOUNTER — Other Ambulatory Visit: Payer: Self-pay | Admitting: Acute Care

## 2021-11-19 DIAGNOSIS — Z87891 Personal history of nicotine dependence: Secondary | ICD-10-CM

## 2022-03-20 IMAGING — CT CT CHEST LUNG CANCER SCREENING LOW DOSE W/O CM
1 series · 10 of 10 positions shown, 13 images · non-contrast
Comparison: Low-dose lung cancer screening chest CT 10/02/2020.

CLINICAL DATA: 75-year-old female former smoker (quit 4 years ago)
with 31 pack-year history of smoking. Lung cancer screening
examination.

EXAM:
CT CHEST WITHOUT CONTRAST LOW-DOSE FOR LUNG CANCER SCREENING
TECHNIQUE: Multidetector CT imaging of the chest was performed following the
standard protocol without IV contrast.

[ct lung segmentation data · axial · 0.81mm/px · z∈[-282,-282]mm · 10 of 283 frames shown]
[frame 1/283  mediastinal]
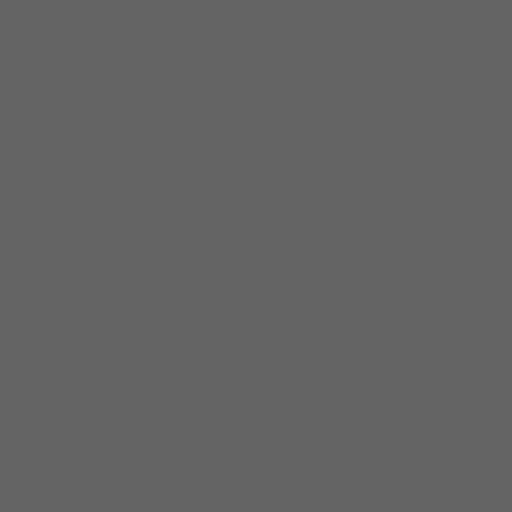
[frame 1/283  lung]
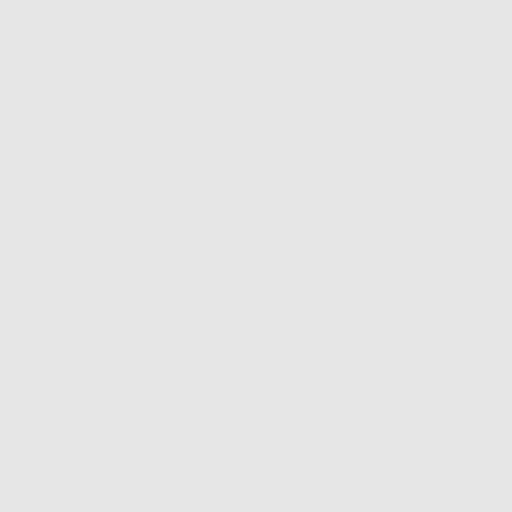
[frame 32/283  lung]
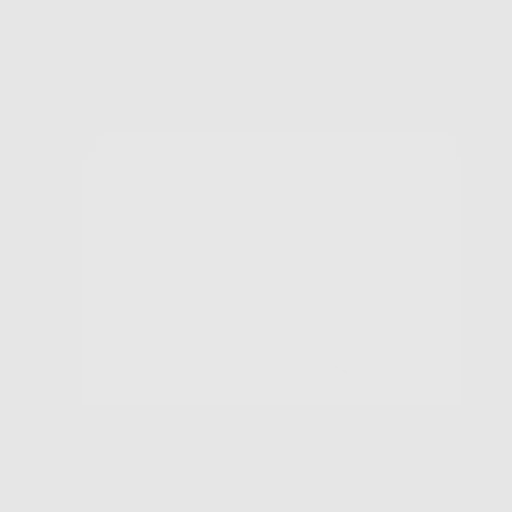
[frame 63/283  lung]
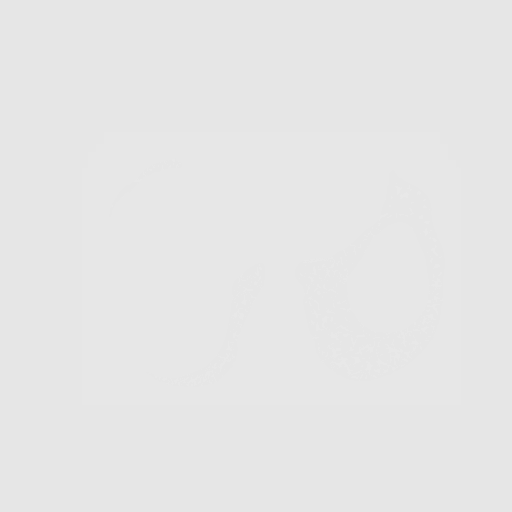
[frame 95/283  lung]
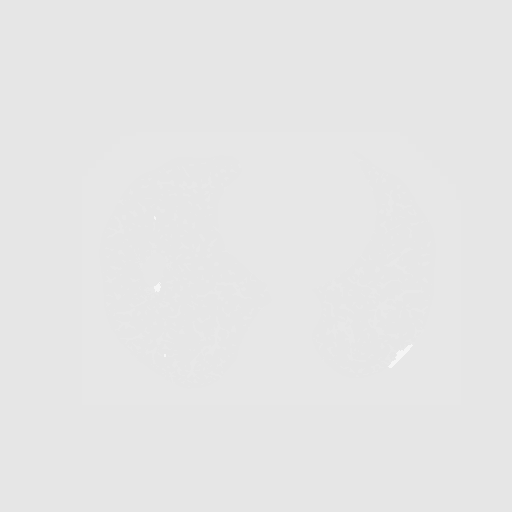
[frame 126/283  mediastinal]
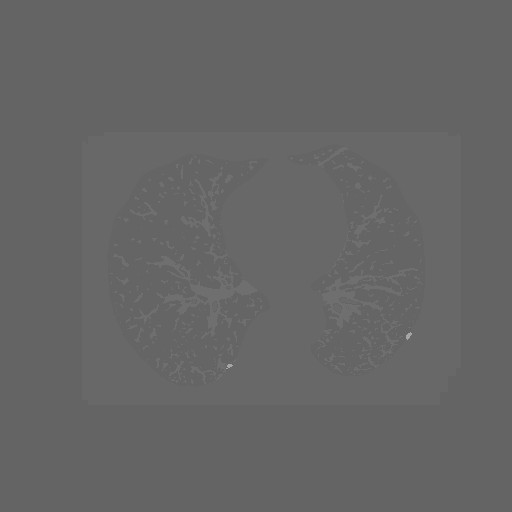
[frame 126/283  lung]
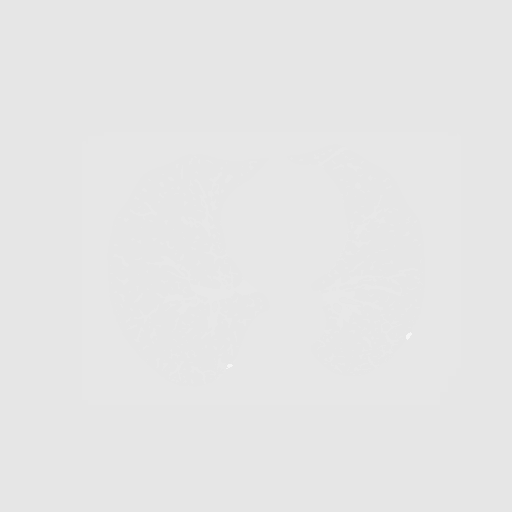
[frame 157/283  lung]
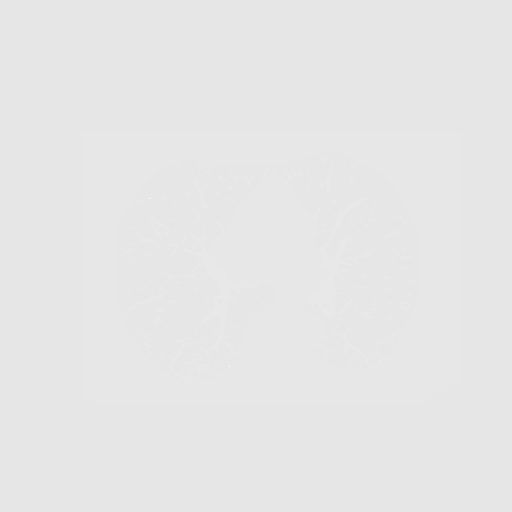
[frame 189/283  lung]
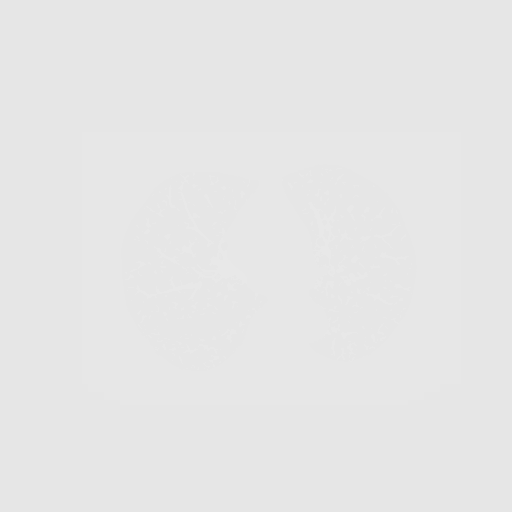
[frame 220/283  lung]
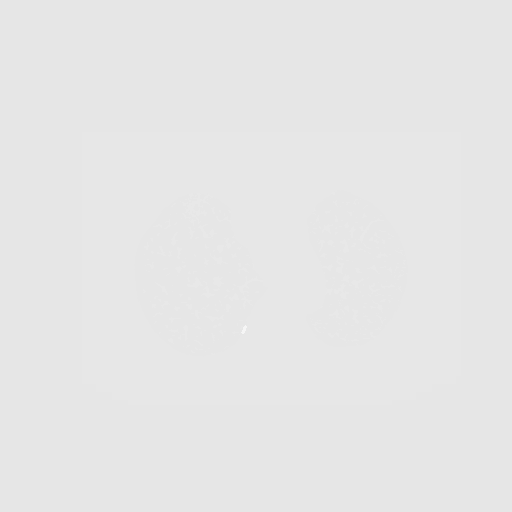
[frame 251/283  mediastinal]
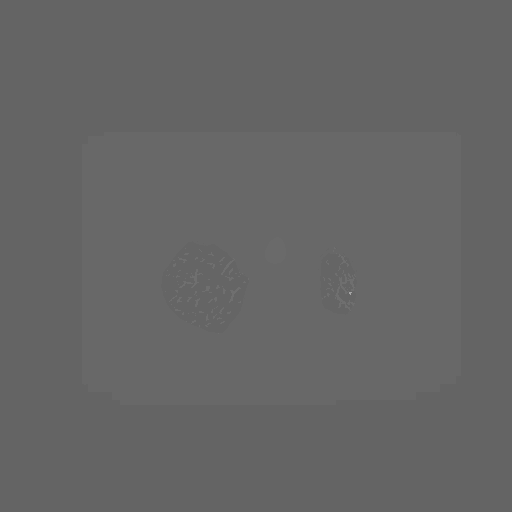
[frame 251/283  lung]
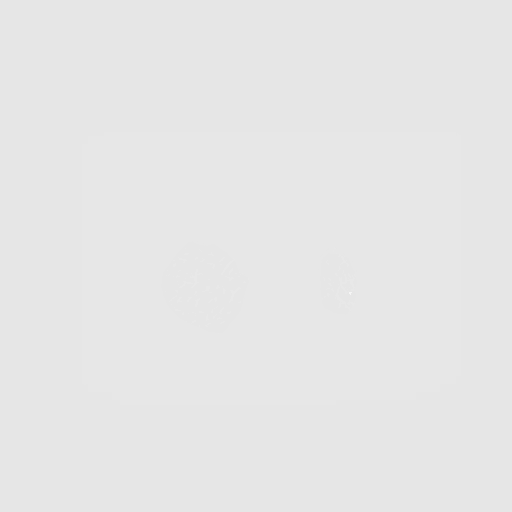
[frame 283/283  lung]
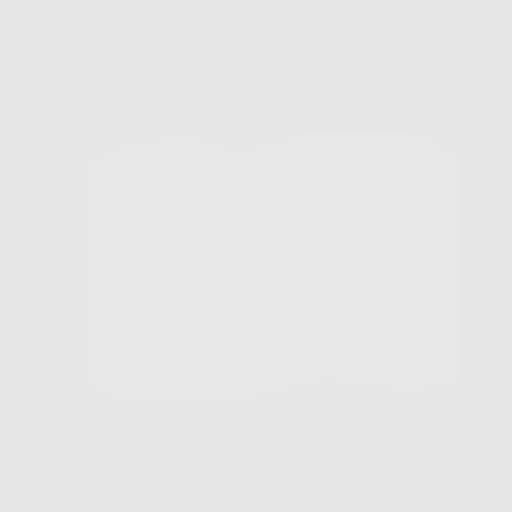

[10 of 10 positions shown; findings below may reference images not displayed]

FINDINGS: Cardiovascular: Heart size is normal. There is no significant
pericardial fluid, thickening or pericardial calcification. There is
aortic atherosclerosis, as well as atherosclerosis of the great
vessels of the mediastinum and the coronary arteries, including
calcified atherosclerotic plaque in the left main, left anterior
descending, left circumflex and right coronary arteries. Mild
calcification of the aortic valve and mitral annulus.

Mediastinum/Nodes: No pathologically enlarged mediastinal or hilar
lymph nodes. Please note that accurate exclusion of hilar adenopathy
is limited on noncontrast CT scans. Esophagus is unremarkable in
appearance. No axillary lymphadenopathy.

Lungs/Pleura: Several small pulmonary nodules are again noted in the
lungs bilaterally, largest of which is in the superior segment of
the left lower lobe (axial image 119 of series 3), with a volume
derived mean diameter 4.8 mm. No larger more suspicious appearing
pulmonary nodules or masses are noted. No acute consolidative
airspace disease. No pleural effusions. Mild diffuse bronchial wall
thickening with mild centrilobular and paraseptal emphysema.

Upper Abdomen: Aortic atherosclerosis. Incompletely imaged large
ventral hernia containing portions of the stomach, transverse colon
and left lobe of the liver.

Musculoskeletal: There are no aggressive appearing lytic or blastic
lesions noted in the visualized portions of the skeleton.
IMPRESSION: 1. Lung-RADS 2S, benign appearance or behavior. Continue annual
screening with low-dose chest CT without contrast in 12 months.
2. The "S" modifier above refers to potentially clinically
significant non lung cancer related findings. Specifically, there is
aortic atherosclerosis, in addition to left main and three-vessel
coronary artery disease. Assessment for potential risk factor
modification, dietary therapy or pharmacologic therapy may be
warranted, if clinically indicated.
3. Mild diffuse bronchial wall thickening with mild centrilobular
and paraseptal emphysema; imaging findings suggestive of underlying
COPD.
4. There are calcifications of the aortic valve and mitral annulus.
Echocardiographic correlation for evaluation of potential valvular
dysfunction may be warranted if clinically indicated.

Aortic Atherosclerosis (48Q1V-64M.M) and Emphysema (48Q1V-IQ6.F).

## 2022-05-13 ENCOUNTER — Ambulatory Visit
Admission: RE | Admit: 2022-05-13 | Discharge: 2022-05-13 | Disposition: A | Discharge status: Home / Self Care | Payer: Medicare Other | Source: Ambulatory Visit | Attending: Nurse Practitioner | Admitting: Nurse Practitioner

## 2022-05-13 ENCOUNTER — Other Ambulatory Visit: Payer: Self-pay | Admitting: Nurse Practitioner

## 2022-05-13 DIAGNOSIS — R1084 Generalized abdominal pain: Secondary | ICD-10-CM | POA: Insufficient documentation

## 2022-05-13 DIAGNOSIS — R197 Diarrhea, unspecified: Secondary | ICD-10-CM | POA: Diagnosis present

## 2022-05-13 DIAGNOSIS — R1032 Left lower quadrant pain: Secondary | ICD-10-CM | POA: Insufficient documentation

## 2022-05-13 LAB — POCT I-STAT CREATININE: Creatinine, Ser: 1 mg/dL (ref 0.44–1.00)

## 2022-05-13 MED ORDER — IOHEXOL 350 MG/ML SOLN
100.0000 mL | Freq: Once | INTRAVENOUS | Status: AC | PRN
  Administered 2022-05-13: 100 mL via INTRAVENOUS

## 2022-07-19 ENCOUNTER — Ambulatory Visit: Admit: 2022-07-19 | Payer: Medicare Other

## 2022-07-19 SURGERY — COLONOSCOPY WITH PROPOFOL
Anesthesia: General

## 2022-08-19 ENCOUNTER — Other Ambulatory Visit: Payer: Self-pay | Admitting: Infectious Diseases

## 2022-08-19 DIAGNOSIS — Z1231 Encounter for screening mammogram for malignant neoplasm of breast: Secondary | ICD-10-CM

## 2022-09-08 ENCOUNTER — Ambulatory Visit
Admission: EM | Admit: 2022-09-08 | Discharge: 2022-09-08 | Disposition: A | Discharge status: Home / Self Care | Payer: Medicare Other

## 2022-09-08 ENCOUNTER — Encounter: Payer: Self-pay | Admitting: Emergency Medicine

## 2022-09-08 DIAGNOSIS — L02413 Cutaneous abscess of right upper limb: Secondary | ICD-10-CM | POA: Diagnosis not present

## 2022-09-08 MED ORDER — DOXYCYCLINE HYCLATE 100 MG PO CAPS: 100.0000 mg | ORAL_CAPSULE | Freq: Two times a day (BID) | ORAL | 0 refills | Status: AC

## 2022-09-08 NOTE — ED Triage Notes (Signed)
Pt presents with a ?insect bite to her right wrist x 3 days

## 2022-09-08 NOTE — ED Provider Notes (Signed)
[facility]    CSN: [id number] Arrival date & time: 09/08/22  1450      History   Chief Complaint Chief Complaint  Patient presents with   Insect Bite    HPI Ruth Gray is a 76 y.o. adult female presenting for 3-day history of redness and swelling of the right dorsal wrist.  Patient says she thinks she potentially was bitten by an insect but did not see an insect.  She says today she woke up and noticed a pustule in the area.  Nothing is drained from the area.  She denies any associated fever.  She says it is tender to touch.  She reports that she has had infections before and thinks it is possible she could have had MRSA previously but is not positive.  She has been trying to keep the area clean but is otherwise not treating condition.  HPI  Past Medical History:  Diagnosis Date   Anemia    Collagen vascular disease ([place])    DVT (deep venous thrombosis) ([place]) 10/2016   left leg   Perforation of colon ([place]) 08/2016   Rheumatoid arteritis ([place])    Tachycardia    Wears dentures    full upper    Patient Active Problem List   Diagnosis Date Noted   Septic arthritis of shoulder, left ([place]) 01/17/2020   H/O ileostomy 12/07/2017   Ileostomy in place [facility]) 06/17/2017   Surgical wound, non healing 03/15/2017   Neutropenia ([place]) 02/25/2017   Abscess of abdominal wall 12/02/2016   Acute deep vein thrombosis (DVT) of distal vein of left lower extremity ([place]) 11/29/2016   B12 deficiency 10/26/2016   Osteoporosis 10/26/2016   Protein-calorie malnutrition, severe 10/06/2016   Acute respiratory failure ([place])    Perforation of cecum due to diverticulitis    SBO (small bowel obstruction) ([place])    Diverticulitis of large intestine without perforation or abscess without bleeding    Acute diverticulitis 09/19/2016   Abdominal pain 09/19/2016   Intractable nausea and vomiting 09/19/2016   Rheumatoid arthritis ([place]) 09/19/2016   Status post total left knee replacement  06/29/2016   Acute pain of right knee 06/08/2016   Primary osteoarthritis of right knee 06/08/2016   Coronary artery calcification seen on CAT scan 04/30/2016   Tachycardia 01/20/2015   Encounter for long-term (current) use of other medications 03/14/2014   Osteoarthritis 03/07/2014    Past Surgical History:  Procedure Laterality Date   ABDOMINAL HYSTERECTOMY     BACK SURGERY  2005   slipped disc lower back, [place]   CATARACT EXTRACTION W/PHACO Right 02/09/2021   Procedure: CATARACT EXTRACTION PHACO AND INTRAOCULAR LENS PLACEMENT ([place]) RIGHT;  Surgeon: Eulogio Bear, MD;  Location: [place];  Service: Ophthalmology;  Laterality: Right;  2.27 0:22.1   CATARACT EXTRACTION W/PHACO Left 03/09/2021   Procedure: CATARACT EXTRACTION PHACO AND INTRAOCULAR LENS PLACEMENT ([facility]) LEFT 3.33 00:25.6;  Surgeon: Eulogio Bear, MD;  Location: [place];  Service: Ophthalmology;  Laterality: Left;   CENTRAL VENOUS CATHETER INSERTION  09/21/2016   Procedure: INSERTION CENTRAL LINE ADULT;  Surgeon: Dia Crawford III, MD;  Location: [facility];  Service: General;;   COLON RESECTION  09/21/2016   Procedure: COLON RESECTION- Ascending and Sigmoid;  Surgeon: Dia Crawford III, MD;  Location: [facility];  Service: General;;   COLOSTOMY N/A 09/25/2016   Procedure: COLOSTOMY;  Surgeon: Dia Crawford III, MD;  Location: [facility];  Service: General;  Laterality: N/A;   DEBRIDEMENT  OF ABDOMINAL WALL ABSCESS N/A 12/03/2016   Procedure: DEBRIDEMENT OF ABDOMINAL WALL ABSCESS;  Surgeon: Jules Husbands, MD;  Location: [facility];  Service: General;  Laterality: N/A;   FOOT SURGERY Left    ILEOSTOMY  08/24/2017   Procedure: ILEOSTOMY Loop Creation;  Surgeon: Clayburn Pert, MD;  Location: [facility];  Service: General;;   ILEOSTOMY CLOSURE N/A 08/24/2017   Procedure: ILEOSTOMY REVERSAL X3;  Surgeon: Clayburn Pert, MD;  Location: [facility];  Service: General;  Laterality: N/A;   ILEOSTOMY CLOSURE N/A  12/07/2017   Procedure: ILEOSTOMY TAKEDOWN;  Surgeon: Clayburn Pert, MD;  Location: [facility];  Service: General;  Laterality: N/A;   JOINT REPLACEMENT Left 2012   knee   LAPAROTOMY N/A 09/21/2016   Procedure: EXPLORATORY LAPAROTOMY;  Surgeon: Dia Crawford III, MD;  Location: [facility];  Service: General;  Laterality: N/A;   LAPAROTOMY N/A 09/25/2016   Procedure: EXPLORATORY LAPAROTOMY and right colon resection;  Surgeon: Dia Crawford III, MD;  Location: [facility];  Service: General;  Laterality: N/A;   LAPAROTOMY  08/24/2017   Procedure: EXPLORATORY LAPAROTOMY;  Surgeon: Clayburn Pert, MD;  Location: [facility];  Service: General;;   REPLACEMENT TOTAL KNEE Left    SHOULDER ARTHROSCOPY Left 01/17/2020   Procedure: ARTHROSCOPY SHOULDER [place];  Surgeon: Leim Fabry, MD;  Location: [facility];  Service: Orthopedics;  Laterality: Left;   SKIN GRAFT Left    lateral lower calf above ankle    OB History   No obstetric history on file.      Home Medications    Prior to Admission medications   Medication Sig Start Date End Date Taking? Authorizing Provider  acetaminophen (TYLENOL) 500 MG tablet Take 1,000 mg by mouth every 8 (eight) hours as needed.    Yes [provider]  Calcium Carb-Cholecalciferol (CALCIUM 600+D3 PO) Take 2 tablets by mouth daily.   Yes [provider]  doxycycline (VIBRAMYCIN) 100 MG capsule Take 1 capsule (100 mg total) by mouth 2 (two) times daily for 7 days. 09/08/22 09/15/22 Yes Laurene Footman B, PA-C  fluticasone (FLONASE) 50 MCG/ACT nasal spray Place into the nose.   Yes [provider]  golimumab (SIMPONI ARIA) 50 MG/4ML SOLN injection Inject into the vein every 8 (eight) weeks. Infusion given at [facility]   Yes [provider]  hydroxychloroquine (PLAQUENIL) 200 MG tablet TAKE 1 TABLET BY MOUTH EVERY DAY 12/08/18  Yes [provider]  metoprolol tartrate (LOPRESSOR) 25 MG tablet TAKE 1/2 TABLET (12.5 MG TOTAL) BY MOUTH 2  (TWO) TIMES DAILY. 11/29/16  Yes [provider]  rivaroxaban (XARELTO) 20 MG TABS tablet Take 20 mg by mouth daily.   Yes [provider]  vitamin B-12 (CYANOCOBALAMIN) 1000 MCG tablet Take 1,000 mcg by mouth daily.   Yes [provider]  colestipol (COLESTID) 1 g tablet Take 1 g by mouth 2 (two) times daily. 07/12/22   [provider]  folic acid (FOLVITE) 1 MG tablet Take 2 tablets by mouth 1 day or 1 dose. 03/19/18   [provider]  Turmeric Curcumin 500 MG CAPS Take by mouth daily.    [provider]    Family History Family History  Problem Relation Age of Onset   Breast cancer Other 36   Prostate cancer Father    Heart attack Father    Ovarian cancer Sister    Kidney cancer Brother    Lung cancer Sister    Kidney disease Mother  Social History Social History   Tobacco Use   Smoking status: Former    Packs/day: 0.50    Years: 35.00    Total pack years: 17.50    Types: Cigarettes    Quit date: 07/15/2016    Years since quitting: 6.1   Smokeless tobacco: Never  Vaping Use   Vaping Use: Never used  Substance Use Topics   Alcohol use: No   Drug use: No     Allergies   Ciprofloxacin, Tramadol, Methotrexate derivatives, and Penicillins   Review of Systems Review of Systems  Constitutional:  Negative for fatigue and fever.  Musculoskeletal:  Negative for arthralgias and joint swelling.  Skin:  Positive for color change and rash.     Physical Exam Triage Vital Signs ED Triage Vitals  Enc Vitals Group     BP      Pulse      Resp      Temp      Temp src      SpO2      Weight      Height      Head Circumference      Peak Flow      Pain Score      Pain Loc      Pain Edu?      Excl. in GC?    No data found.  Updated Vital Signs BP 128/81 (BP Location: Left Arm)   Pulse 80   Temp 97.9 F (36.6 C) (Oral)   Resp 16   SpO2 97%      Physical Exam Vitals and nursing note reviewed.   Constitutional:      General: She is not in acute distress.    Appearance: Normal appearance. She is not ill-appearing or toxic-appearing.  HENT:     Head: Normocephalic and atraumatic.  Eyes:     General: No scleral icterus.       Right eye: No discharge.        Left eye: No discharge.     Conjunctiva/sclera: Conjunctivae normal.  Cardiovascular:     Rate and Rhythm: Normal rate and regular rhythm.     Heart sounds: Normal heart sounds.  Pulmonary:     Effort: Pulmonary effort is normal. No respiratory distress.     Breath sounds: Normal breath sounds.  Musculoskeletal:     Cervical back: Neck supple.  Skin:    General: Skin is dry.     Comments: Right dorsal wrist: There is a large pustule (1 cm) with surrounding erythema  (2.5 cm in each direction) and increased warmth.  The area is tender to palpation.  There is no drainage.  Neurological:     General: No focal deficit present.     Mental Status: She is alert. Mental status is at baseline.     Motor: No weakness.     Gait: Gait normal.  Psychiatric:        Mood and Affect: Mood normal.        Behavior: Behavior normal.        Thought Content: Thought content normal.       UC Treatments / Results  Labs (all labs ordered are listed, but only abnormal results are displayed) Labs Reviewed - No data to display  EKG   Radiology No results found.  Procedures Procedures (including critical care time)  Incision and drainage of pustule/abscess: Patient given consent for procedure.  Discussed risk versus benefits of procedure.  Cleansed the area with  call swabs.  Used a 21-gauge needle and punctured the center.  I was able to then press on the area and express thick serosanguineous fluid.  Cleansed the area with alcohol, applied topical bacitracin and covered with gauze and Coban.  Medications Ordered in UC Medications - No data to display  Initial Impression / Assessment and Plan / UC Course  I have reviewed the  triage vital signs and the nursing notes.  Pertinent labs & imaging results that were available during my care of the patient were reviewed by me and considered in my medical decision making (see chart for details).   76 year old female presents for abscess/cellulitis to right dorsal wrist for the past couple of days.  Potential history of MRSA.  Patient reports having skin infections in the past requiring her to have a wound VAC.  She has not had any fevers or red flag signs/symptoms.  I have included a picture in the chart of the patient's condition.  It is consistent with a tiny abscess/pustule and surrounding cellulitis.  Patient gave consent for I&D procedure.  I was able to express some thick serosanguineous fluid.  Discussed wound care with patient.  We will treat her at this time with doxycycline.  Advised to return if she has a fever or worsening symptoms or she is not improving in the next 2 to 3 days.   Final Clinical Impressions(s) / UC Diagnoses   Final diagnoses:  Cutaneous abscess of right wrist     Discharge Instructions      -You have an infection of your wrist.  It is likely a staph infection.  I have sent an antibiotic to the pharmacy.  Clean this area well every day with soap and water, during the shower would be fine and then may apply bandage after if it is draining at all. - This should be improving over the next couple of days.  If you feel that the redness, swelling is worsening or you develop fever or increased pain you should be seen again right away.     ED Prescriptions     Medication Sig Dispense Auth. Provider   doxycycline (VIBRAMYCIN) 100 MG capsule Take 1 capsule (100 mg total) by mouth 2 (two) times daily for 7 days. 14 capsule Danton Clap, PA-C      PDMP not reviewed this encounter.   Danton Clap, PA-C 09/08/22 1629

## 2022-09-08 NOTE — Discharge Instructions (Signed)
-  You have an infection of your wrist.  It is likely a staph infection.  I have sent an antibiotic to the pharmacy.  Clean this area well every day with soap and water, during the shower would be fine and then may apply bandage after if it is draining at all. - This should be improving over the next couple of days.  If you feel that the redness, swelling is worsening or you develop fever or increased pain you should be seen again right away.

## 2022-09-09 ENCOUNTER — Ambulatory Visit
Admission: RE | Admit: 2022-09-09 | Discharge: 2022-09-09 | Disposition: A | Discharge status: Home / Self Care | Payer: Medicare Other | Source: Ambulatory Visit | Attending: Infectious Diseases | Admitting: Infectious Diseases

## 2022-09-09 DIAGNOSIS — Z1231 Encounter for screening mammogram for malignant neoplasm of breast: Secondary | ICD-10-CM | POA: Diagnosis present

## 2022-11-18 ENCOUNTER — Ambulatory Visit: Admission: RE | Admit: 2022-11-18 | Payer: Medicare Other | Source: Ambulatory Visit

## 2023-04-28 ENCOUNTER — Telehealth: Payer: Self-pay | Admitting: Acute Care

## 2023-04-28 ENCOUNTER — Other Ambulatory Visit: Payer: Self-pay | Admitting: Emergency Medicine

## 2023-04-28 DIAGNOSIS — Z122 Encounter for screening for malignant neoplasm of respiratory organs: Secondary | ICD-10-CM

## 2023-04-28 DIAGNOSIS — Z87891 Personal history of nicotine dependence: Secondary | ICD-10-CM

## 2023-04-28 NOTE — Telephone Encounter (Signed)
Called and left VM for pt to call back to schedule annual LDCT. Pt had SDMV on 05/29/2015.

## 2023-04-29 NOTE — Telephone Encounter (Signed)
Pt has been scheduled for LDCT for 05/11/23. Will close encounter.

## 2023-05-11 ENCOUNTER — Ambulatory Visit
Admission: RE | Admit: 2023-05-11 | Discharge: 2023-05-11 | Disposition: A | Discharge status: Home / Self Care | Payer: Medicare Other | Source: Ambulatory Visit | Attending: Acute Care | Admitting: Acute Care

## 2023-05-11 DIAGNOSIS — Z87891 Personal history of nicotine dependence: Secondary | ICD-10-CM | POA: Diagnosis present

## 2023-05-11 DIAGNOSIS — Z122 Encounter for screening for malignant neoplasm of respiratory organs: Secondary | ICD-10-CM | POA: Diagnosis present

## 2023-05-16 ENCOUNTER — Ambulatory Visit
Admission: EM | Admit: 2023-05-16 | Discharge: 2023-05-16 | Disposition: A | Discharge status: Home / Self Care | Payer: BC Managed Care – PPO | Attending: Family Medicine | Admitting: Family Medicine

## 2023-05-16 DIAGNOSIS — B372 Candidiasis of skin and nail: Secondary | ICD-10-CM

## 2023-05-16 MED ORDER — FLUCONAZOLE 150 MG PO TABS: 150.0000 mg | ORAL_TABLET | ORAL | 0 refills | Status: AC

## 2023-05-16 MED ORDER — NYSTATIN 100000 UNIT/GM EX POWD: 1.0000 | Freq: Three times a day (TID) | CUTANEOUS | 0 refills | Status: AC

## 2023-05-16 NOTE — ED Provider Notes (Signed)
[facility]    CSN: [id number] Arrival date & time: 05/16/23  4098      History   Chief Complaint Chief Complaint  Patient presents with   Rash    HPI Ruth Gray is a 77 y.o. adult.   HPI  Ruth Gray presents for red rash under left breast. The area doesn't itch. She had been outside and thought she got into poison [place]. She put calamine lotion on the area which didn't help.  She is afaid that she has shingles as ashe hasn't gotten the shingles shot.      Past Medical History:  Diagnosis Date   Anemia    Collagen vascular disease (HCC)    DVT (deep venous thrombosis) (HCC) 10/2016   left leg   Perforation of colon (HCC) 08/2016   Rheumatoid arteritis (HCC)    Tachycardia    Wears dentures    full upper    Patient Active Problem List   Diagnosis Date Noted   Septic arthritis of shoulder, left (HCC) 01/17/2020   H/O ileostomy 12/07/2017   Ileostomy in place [facility]) 06/17/2017   Surgical wound, non healing 03/15/2017   Neutropenia (HCC) 02/25/2017   Abscess of abdominal wall 12/02/2016   Acute deep vein thrombosis (DVT) of distal vein of left lower extremity (HCC) 11/29/2016   B12 deficiency 10/26/2016   Osteoporosis 10/26/2016   Protein-calorie malnutrition, severe 10/06/2016   Acute respiratory failure (HCC)    Perforation of cecum due to diverticulitis    SBO (small bowel obstruction) (HCC)    Diverticulitis of large intestine without perforation or abscess without bleeding    Acute diverticulitis 09/19/2016   Abdominal pain 09/19/2016   Intractable nausea and vomiting 09/19/2016   Rheumatoid arthritis (HCC) 09/19/2016   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   Acute pain of right knee 06/08/2016   Primary osteoarthritis of right knee 06/08/2016   Coronary artery calcification seen on CAT scan 04/30/2016   Tachycardia 01/20/2015   Encounter for long-term (current) use of other medications 03/14/2014   Osteoarthritis 03/07/2014     Past Surgical History:  Procedure Laterality Date   ABDOMINAL HYSTERECTOMY     BACK SURGERY  2005   slipped disc lower back, [place]   CATARACT EXTRACTION W/PHACO Right 02/09/2021   Procedure: CATARACT EXTRACTION PHACO AND INTRAOCULAR LENS PLACEMENT (IOC) RIGHT;  Surgeon: Nevada Crane, MD;  Location: [facility];  Service: Ophthalmology;  Laterality: Right;  2.27 0:22.1   CATARACT EXTRACTION W/PHACO Left 03/09/2021   Procedure: CATARACT EXTRACTION PHACO AND INTRAOCULAR LENS PLACEMENT (IOC) LEFT 3.33 00:25.6;  Surgeon: Nevada Crane, MD;  Location: [facility];  Service: Ophthalmology;  Laterality: Left;   CENTRAL VENOUS CATHETER INSERTION  09/21/2016   Procedure: INSERTION CENTRAL LINE ADULT;  Surgeon: Tiney Rouge III, MD;  Location: [facility];  Service: General;;   COLON RESECTION  09/21/2016   Procedure: COLON RESECTION- Ascending and Sigmoid;  Surgeon: Tiney Rouge III, MD;  Location: [facility];  Service: General;;   COLOSTOMY N/A 09/25/2016   Procedure: COLOSTOMY;  Surgeon: Tiney Rouge III, MD;  Location: [facility];  Service: General;  Laterality: N/A;   DEBRIDEMENT OF ABDOMINAL WALL ABSCESS N/A 12/03/2016   Procedure: DEBRIDEMENT OF ABDOMINAL WALL ABSCESS;  Surgeon: Leafy Ro, MD;  Location: [facility];  Service: General;  Laterality: N/A;   FOOT SURGERY Left    ILEOSTOMY  08/24/2017   Procedure: ILEOSTOMY Loop Creation;  Surgeon: Ricarda Frame, MD;  Location:  [facility];  Service: General;;   ILEOSTOMY CLOSURE N/A 08/24/2017   Procedure: ILEOSTOMY REVERSAL X3;  Surgeon: Ricarda Frame, MD;  Location: [facility];  Service: General;  Laterality: N/A;   ILEOSTOMY CLOSURE N/A 12/07/2017   Procedure: ILEOSTOMY TAKEDOWN;  Surgeon: Ricarda Frame, MD;  Location: [facility];  Service: General;  Laterality: N/A;   JOINT REPLACEMENT Left 2012   knee   LAPAROTOMY N/A 09/21/2016   Procedure: EXPLORATORY LAPAROTOMY;  Surgeon: Tiney Rouge III, MD;  Location: [facility];   Service: General;  Laterality: N/A;   LAPAROTOMY N/A 09/25/2016   Procedure: EXPLORATORY LAPAROTOMY and right colon resection;  Surgeon: Tiney Rouge III, MD;  Location: [facility];  Service: General;  Laterality: N/A;   LAPAROTOMY  08/24/2017   Procedure: EXPLORATORY LAPAROTOMY;  Surgeon: Ricarda Frame, MD;  Location: [facility];  Service: General;;   REPLACEMENT TOTAL KNEE Left    SHOULDER ARTHROSCOPY Left 01/17/2020   Procedure: ARTHROSCOPY SHOULDER WASH OUT;  Surgeon: Signa Kell, MD;  Location: [facility];  Service: Orthopedics;  Laterality: Left;   SKIN GRAFT Left    lateral lower calf above ankle    OB History   No obstetric history on file.      Home Medications    Prior to Admission medications   Medication Sig Start Date End Date Taking? Authorizing Provider  acetaminophen (TYLENOL) 500 MG tablet Take 1,000 mg by mouth every 8 (eight) hours as needed.    Yes [provider]  alendronate (FOSAMAX) 70 MG tablet Take 70 mg by mouth once a week.   Yes [provider]  Calcium Carb-Cholecalciferol (CALCIUM 600+D3 PO) Take 2 tablets by mouth daily.   Yes [provider]  colestipol (COLESTID) 1 g tablet Take 1 g by mouth 2 (two) times daily. 07/12/22  Yes [provider]  fluconazole (DIFLUCAN) 150 MG tablet Take 1 tablet (150 mg total) by mouth every 3 (three) days for 2 doses. 05/16/23 05/20/23 Yes Snigdha Howser, DO  fluticasone (FLONASE) 50 MCG/ACT nasal spray Place into the nose.   Yes [provider]  folic acid (FOLVITE) 1 MG tablet Take 2 tablets by mouth 1 day or 1 dose. 03/19/18  Yes [provider]  golimumab (SIMPONI ARIA) 50 MG/4ML SOLN injection Inject into the vein every 8 (eight) weeks. Infusion given at [facility]   Yes [provider]  hydroxychloroquine (PLAQUENIL) 200 MG tablet TAKE 1 TABLET BY MOUTH EVERY DAY 12/08/18  Yes [provider]  ketorolac (ACULAR) 0.5 % ophthalmic solution Place 1 drop  into the left eye 4 (four) times daily. 03/28/23  Yes [provider]  metoprolol tartrate (LOPRESSOR) 25 MG tablet TAKE 1/2 TABLET (12.5 MG TOTAL) BY MOUTH 2 (TWO) TIMES DAILY. 11/29/16  Yes [provider]  nystatin (MYCOSTATIN/NYSTOP) powder Apply 1 Application topically 3 (three) times daily. 05/16/23  Yes Ameya Kutz, DO  rivaroxaban (XARELTO) 20 MG TABS tablet Take 20 mg by mouth daily.   Yes [provider]  Turmeric Curcumin 500 MG CAPS Take by mouth daily.   Yes [provider]  vitamin B-12 (CYANOCOBALAMIN) 1000 MCG tablet Take 1,000 mcg by mouth daily.   Yes [provider]    Family History Family History  Problem Relation Age of Onset   Breast cancer Other 36   Prostate cancer Father    Heart attack Father    Ovarian cancer Sister    Kidney cancer Brother    Lung cancer Sister  Kidney disease Mother     Social History Social History   Tobacco Use   Smoking status: Former    Packs/day: 0.50    Years: 35.00    Additional pack years: 0.00    Total pack years: 17.50    Types: Cigarettes    Quit date: 07/15/2016    Years since quitting: 6.8   Smokeless tobacco: Never  Vaping Use   Vaping Use: Never used  Substance Use Topics   Alcohol use: No   Drug use: No     Allergies   Ciprofloxacin, Abatacept, Tramadol, Methotrexate derivatives, and Penicillins   Review of Systems Review of Systems :negative unless otherwise stated in HPI.      Physical Exam Triage Vital Signs ED Triage Vitals  Enc Vitals Group     BP 05/16/23 1020 (!) 164/92     Pulse Rate 05/16/23 1020 97     Resp 05/16/23 1020 16     Temp 05/16/23 1020 98.1 F (36.7 C)     Temp Source 05/16/23 1020 Oral     SpO2 05/16/23 1020 98 %     Weight 05/16/23 1019 190 lb (86.2 kg)     Height 05/16/23 1019 5\' 7"  (1.702 m)     Head Circumference --      Peak Flow --      Pain Score 05/16/23 1032 7     Pain Loc --      Pain Edu? --      Excl. in GC?  --    No data found.  Updated Vital Signs BP (!) 164/92 (BP Location: Left Arm)   Pulse 97   Temp 98.1 F (36.7 C) (Oral)   Resp 16   Ht 5\' 7"  (1.702 m)   Wt 86.2 kg   SpO2 98%   BMI 29.76 kg/m   Visual Acuity Right Eye Distance:   Left Eye Distance:   Bilateral Distance:    Right Eye Near:   Left Eye Near:    Bilateral Near:     Physical Exam  GEN: alert, well appearing adult, in no acute distress  RESP: no increased work of breathing SKIN: warm and dry; erythematous patch under left breast with some scaling with odor    UC Treatments / Results  Labs (all labs ordered are listed, but only abnormal results are displayed) Labs Reviewed - No data to display  EKG   Radiology No results found.  Procedures Procedures (including critical care time)  Medications Ordered in UC Medications - No data to display  Initial Impression / Assessment and Plan / UC Course  I have reviewed the triage vital signs and the nursing notes.  Pertinent labs & imaging results that were available during my care of the patient were reviewed by me and considered in my medical decision making (see chart for details).     Patient is a 77 y.o. adultwho presents for rash under left breast .  Overall, patient is well-appearing and well-hydrated.  Vital signs stable.  Bryttany is afebrile.  Exam concerning for candidal intertrigo.  Patient notes she sweats a lot.  Treat with Diflucan and nystatin powder.  Does not appear to be shingles or contact dermatitis. No sign of infection to suggest antibiotics at this time.     Reviewed expectations regarding course of current medical issues.  All questions asked were answered.  Outlined signs and symptoms indicating need for more acute intervention. Patient verbalized understanding. After Visit Summary given.  Final Clinical Impressions(s) / UC Diagnoses   Final diagnoses:  Candidal intertrigo     Discharge Instructions      You have a  fungal infection under your breast. Stop by the pharmacy to pick up your prescriptions.  Follow up with your primary care provider as needed.      ED Prescriptions     Medication Sig Dispense Auth. Provider   fluconazole (DIFLUCAN) 150 MG tablet Take 1 tablet (150 mg total) by mouth every 3 (three) days for 2 doses. 2 tablet Chablis Losh, DO   nystatin (MYCOSTATIN/NYSTOP) powder Apply 1 Application topically 3 (three) times daily. 15 g Katha Cabal, DO      PDMP not reviewed this encounter.              Katha Cabal, DO 05/16/23 1248

## 2023-05-16 NOTE — Discharge Instructions (Addendum)
You have a fungal infection under your breast. Stop by the pharmacy to pick up your prescriptions.  Follow up with your primary care provider as needed.

## 2023-05-16 NOTE — ED Triage Notes (Signed)
Pt c/o rash under L breast x2 days. Has tried calamine lotion w/o relief. Denies any itchiness or pain.

## 2023-05-17 ENCOUNTER — Other Ambulatory Visit: Payer: Self-pay | Admitting: Acute Care

## 2023-05-17 DIAGNOSIS — Z122 Encounter for screening for malignant neoplasm of respiratory organs: Secondary | ICD-10-CM

## 2023-05-17 DIAGNOSIS — Z87891 Personal history of nicotine dependence: Secondary | ICD-10-CM

## 2023-08-22 ENCOUNTER — Other Ambulatory Visit: Payer: Self-pay | Admitting: Infectious Diseases

## 2023-08-22 DIAGNOSIS — Z1231 Encounter for screening mammogram for malignant neoplasm of breast: Secondary | ICD-10-CM

## 2023-09-12 ENCOUNTER — Ambulatory Visit
Admission: RE | Admit: 2023-09-12 | Discharge: 2023-09-12 | Disposition: A | Discharge status: Home / Self Care | Payer: Medicare Other | Source: Ambulatory Visit | Attending: Infectious Diseases | Admitting: Infectious Diseases

## 2023-09-12 DIAGNOSIS — Z1231 Encounter for screening mammogram for malignant neoplasm of breast: Secondary | ICD-10-CM | POA: Diagnosis present

## 2024-05-01 ENCOUNTER — Telehealth: Payer: Self-pay | Admitting: Acute Care

## 2024-05-01 NOTE — Telephone Encounter (Signed)
 Left VM for pt to call for rescheduling of 05/11/24 LDCT appt at [facility].  [place] has notified us  to move pt appts to any Wednesday open schedule.  Suggest 05/16/24 or later for this patient's appt.  Left our direct lines for reschduling [phone] or [phone]

## 2024-05-11 ENCOUNTER — Ambulatory Visit

## 2024-05-16 ENCOUNTER — Ambulatory Visit
Admission: RE | Admit: 2024-05-16 | Discharge: 2024-05-16 | Disposition: A | Discharge status: Home / Self Care | Source: Ambulatory Visit | Attending: Acute Care | Admitting: Acute Care

## 2024-05-16 DIAGNOSIS — Z87891 Personal history of nicotine dependence: Secondary | ICD-10-CM | POA: Diagnosis present

## 2024-05-16 DIAGNOSIS — Z122 Encounter for screening for malignant neoplasm of respiratory organs: Secondary | ICD-10-CM | POA: Diagnosis present

## 2024-05-28 ENCOUNTER — Other Ambulatory Visit: Payer: Self-pay

## 2024-05-28 DIAGNOSIS — Z87891 Personal history of nicotine dependence: Secondary | ICD-10-CM

## 2024-05-28 DIAGNOSIS — Z122 Encounter for screening for malignant neoplasm of respiratory organs: Secondary | ICD-10-CM

## 2024-08-23 ENCOUNTER — Other Ambulatory Visit: Payer: Self-pay | Admitting: Infectious Diseases

## 2024-08-23 DIAGNOSIS — Z1231 Encounter for screening mammogram for malignant neoplasm of breast: Secondary | ICD-10-CM

## 2024-09-26 ENCOUNTER — Ambulatory Visit
Admission: RE | Admit: 2024-09-26 | Discharge: 2024-09-26 | Disposition: A | Discharge status: Home / Self Care | Source: Ambulatory Visit | Attending: Infectious Diseases | Admitting: Infectious Diseases

## 2024-09-26 DIAGNOSIS — Z1231 Encounter for screening mammogram for malignant neoplasm of breast: Secondary | ICD-10-CM | POA: Diagnosis present
